# Patient Record
Sex: Female | Born: 1950 | Race: White | Hispanic: No | Smoking: Never smoker
Health system: Southern US, Community
[De-identification: ages and names within clinical notes are randomized; demographics above are authoritative.]

## PROBLEM LIST (undated history)

## (undated) DIAGNOSIS — F32A Depression, unspecified: Secondary | ICD-10-CM

## (undated) DIAGNOSIS — E079 Disorder of thyroid, unspecified: Secondary | ICD-10-CM

## (undated) DIAGNOSIS — I5189 Other ill-defined heart diseases: Secondary | ICD-10-CM

## (undated) DIAGNOSIS — C4490 Unspecified malignant neoplasm of skin, unspecified: Secondary | ICD-10-CM

## (undated) DIAGNOSIS — F329 Major depressive disorder, single episode, unspecified: Secondary | ICD-10-CM

## (undated) DIAGNOSIS — Z9889 Other specified postprocedural states: Secondary | ICD-10-CM

## (undated) DIAGNOSIS — M1990 Unspecified osteoarthritis, unspecified site: Secondary | ICD-10-CM

## (undated) DIAGNOSIS — E785 Hyperlipidemia, unspecified: Secondary | ICD-10-CM

## (undated) DIAGNOSIS — I10 Essential (primary) hypertension: Secondary | ICD-10-CM

## (undated) DIAGNOSIS — M810 Age-related osteoporosis without current pathological fracture: Secondary | ICD-10-CM

## (undated) DIAGNOSIS — R112 Nausea with vomiting, unspecified: Secondary | ICD-10-CM

## (undated) DIAGNOSIS — G709 Myoneural disorder, unspecified: Secondary | ICD-10-CM

## (undated) DIAGNOSIS — Z8673 Personal history of transient ischemic attack (TIA), and cerebral infarction without residual deficits: Secondary | ICD-10-CM

## (undated) DIAGNOSIS — M4800 Spinal stenosis, site unspecified: Secondary | ICD-10-CM

## (undated) DIAGNOSIS — F419 Anxiety disorder, unspecified: Secondary | ICD-10-CM

## (undated) DIAGNOSIS — I4891 Unspecified atrial fibrillation: Secondary | ICD-10-CM

## (undated) HISTORY — DX: Myoneural disorder, unspecified: G70.9

## (undated) HISTORY — DX: Age-related osteoporosis without current pathological fracture: M81.0

## (undated) HISTORY — PX: OTHER SURGICAL HISTORY: SHX169

## (undated) HISTORY — DX: Unspecified osteoarthritis, unspecified site: M19.90

## (undated) HISTORY — DX: Major depressive disorder, single episode, unspecified: F32.9

## (undated) HISTORY — DX: Personal history of transient ischemic attack (TIA), and cerebral infarction without residual deficits: Z86.73

## (undated) HISTORY — PX: TONSILLECTOMY: SUR1361

## (undated) HISTORY — DX: Hyperlipidemia, unspecified: E78.5

## (undated) HISTORY — DX: Depression, unspecified: F32.A

## (undated) HISTORY — PX: CHOLECYSTECTOMY: SHX55

## (undated) HISTORY — DX: Unspecified atrial fibrillation: I48.91

## (undated) HISTORY — PX: LAPAROSCOPIC GASTRIC SLEEVE RESECTION: SHX5895

## (undated) HISTORY — DX: Other ill-defined heart diseases: I51.89

## (undated) HISTORY — PX: CATARACT EXTRACTION: SUR2

## (undated) HISTORY — DX: Anxiety disorder, unspecified: F41.9

## (undated) HISTORY — DX: Unspecified malignant neoplasm of skin, unspecified: C44.90

## (undated) HISTORY — PX: COLON SURGERY: SHX602

---

## 2015-01-12 ENCOUNTER — Other Ambulatory Visit: Payer: Self-pay | Admitting: Orthopedic Surgery

## 2015-01-12 DIAGNOSIS — M545 Low back pain, unspecified: Secondary | ICD-10-CM

## 2015-01-25 ENCOUNTER — Other Ambulatory Visit: Payer: Self-pay

## 2015-01-27 ENCOUNTER — Ambulatory Visit
Admission: RE | Admit: 2015-01-27 | Discharge: 2015-01-27 | Disposition: A | Discharge status: Home / Self Care | Payer: BLUE CROSS/BLUE SHIELD | Source: Ambulatory Visit | Attending: Orthopedic Surgery | Admitting: Orthopedic Surgery

## 2015-01-27 DIAGNOSIS — M545 Low back pain, unspecified: Secondary | ICD-10-CM

## 2016-01-08 ENCOUNTER — Emergency Department (HOSPITAL_COMMUNITY): Payer: BLUE CROSS/BLUE SHIELD

## 2016-01-08 ENCOUNTER — Encounter (HOSPITAL_COMMUNITY): Payer: Self-pay | Admitting: Emergency Medicine

## 2016-01-08 ENCOUNTER — Inpatient Hospital Stay (HOSPITAL_COMMUNITY)
Admission: EM | Admit: 2016-01-08 | Discharge: 2016-01-15 | DRG: 481 | Disposition: A | Discharge status: Skilled Nursing Facility | Payer: BLUE CROSS/BLUE SHIELD | Attending: Orthopedic Surgery | Admitting: Orthopedic Surgery

## 2016-01-08 DIAGNOSIS — S72451A Displaced supracondylar fracture without intracondylar extension of lower end of right femur, initial encounter for closed fracture: Principal | ICD-10-CM | POA: Diagnosis present

## 2016-01-08 DIAGNOSIS — Z79899 Other long term (current) drug therapy: Secondary | ICD-10-CM

## 2016-01-08 DIAGNOSIS — Z881 Allergy status to other antibiotic agents status: Secondary | ICD-10-CM

## 2016-01-08 DIAGNOSIS — I10 Essential (primary) hypertension: Secondary | ICD-10-CM | POA: Diagnosis present

## 2016-01-08 DIAGNOSIS — D62 Acute posthemorrhagic anemia: Secondary | ICD-10-CM

## 2016-01-08 DIAGNOSIS — S72401A Unspecified fracture of lower end of right femur, initial encounter for closed fracture: Secondary | ICD-10-CM | POA: Diagnosis present

## 2016-01-08 DIAGNOSIS — M2341 Loose body in knee, right knee: Secondary | ICD-10-CM | POA: Diagnosis present

## 2016-01-08 DIAGNOSIS — Z6841 Body Mass Index (BMI) 40.0 and over, adult: Secondary | ICD-10-CM | POA: Diagnosis not present

## 2016-01-08 DIAGNOSIS — R001 Bradycardia, unspecified: Secondary | ICD-10-CM | POA: Diagnosis present

## 2016-01-08 DIAGNOSIS — W1809XA Striking against other object with subsequent fall, initial encounter: Secondary | ICD-10-CM | POA: Diagnosis present

## 2016-01-08 DIAGNOSIS — M4800 Spinal stenosis, site unspecified: Secondary | ICD-10-CM | POA: Diagnosis present

## 2016-01-08 DIAGNOSIS — Z880 Allergy status to penicillin: Secondary | ICD-10-CM | POA: Diagnosis not present

## 2016-01-08 DIAGNOSIS — Z419 Encounter for procedure for purposes other than remedying health state, unspecified: Secondary | ICD-10-CM

## 2016-01-08 DIAGNOSIS — Y92009 Unspecified place in unspecified non-institutional (private) residence as the place of occurrence of the external cause: Secondary | ICD-10-CM

## 2016-01-08 HISTORY — DX: Other specified postprocedural states: Z98.890

## 2016-01-08 HISTORY — DX: Spinal stenosis, site unspecified: M48.00

## 2016-01-08 HISTORY — DX: Disorder of thyroid, unspecified: E07.9

## 2016-01-08 HISTORY — DX: Nausea with vomiting, unspecified: R11.2

## 2016-01-08 HISTORY — DX: Essential (primary) hypertension: I10

## 2016-01-08 LAB — CBC WITH DIFFERENTIAL/PLATELET
BASOS ABS: 0 10*3/uL (ref 0.0–0.1)
BASOS PCT: 0 %
EOS ABS: 0.1 10*3/uL (ref 0.0–0.7)
EOS PCT: 1 %
HCT: 38.2 % (ref 36.0–46.0)
Hemoglobin: 12.8 g/dL (ref 12.0–15.0)
Lymphocytes Relative: 11 %
Lymphs Abs: 1.7 10*3/uL (ref 0.7–4.0)
MCH: 30 pg (ref 26.0–34.0)
MCHC: 33.5 g/dL (ref 30.0–36.0)
MCV: 89.7 fL (ref 78.0–100.0)
Monocytes Absolute: 0.7 10*3/uL (ref 0.1–1.0)
Monocytes Relative: 5 %
Neutro Abs: 12.2 10*3/uL — ABNORMAL HIGH (ref 1.7–7.7)
Neutrophils Relative %: 83 %
PLATELETS: 191 10*3/uL (ref 150–400)
RBC: 4.26 MIL/uL (ref 3.87–5.11)
RDW: 13.5 % (ref 11.5–15.5)
WBC: 14.6 10*3/uL — AB (ref 4.0–10.5)

## 2016-01-08 LAB — BASIC METABOLIC PANEL
Anion gap: 8 (ref 5–15)
BUN: 19 mg/dL (ref 6–20)
CO2: 25 mmol/L (ref 22–32)
CREATININE: 0.83 mg/dL (ref 0.44–1.00)
Calcium: 9.1 mg/dL (ref 8.9–10.3)
Chloride: 106 mmol/L (ref 101–111)
GFR calc Af Amer: >60 mL/min (ref >60)
Glucose, Bld: 121 mg/dL — ABNORMAL HIGH (ref 65–99)
POTASSIUM: 3.5 mmol/L (ref 3.5–5.1)
SODIUM: 139 mmol/L (ref 135–145)

## 2016-01-08 LAB — URINALYSIS, ROUTINE W REFLEX MICROSCOPIC
BILIRUBIN URINE: NEGATIVE
Glucose, UA: NEGATIVE mg/dL
HGB URINE DIPSTICK: NEGATIVE
Ketones, ur: NEGATIVE mg/dL
Leukocytes, UA: NEGATIVE
Nitrite: NEGATIVE
PROTEIN: NEGATIVE mg/dL
Specific Gravity, Urine: 1.016 (ref 1.005–1.030)
pH: 5.5 (ref 5.0–8.0)

## 2016-01-08 MED ORDER — PROMETHAZINE HCL 25 MG/ML IJ SOLN
12.5000 mg | INTRAMUSCULAR | Status: AC
  Administered 2016-01-08: 12.5 mg via INTRAVENOUS
  Filled 2016-01-08: qty 1

## 2016-01-08 MED ORDER — HYDROCHLOROTHIAZIDE 25 MG PO TABS
25.0000 mg | ORAL_TABLET | Freq: Every day | ORAL | Status: DC
  Administered 2016-01-08 – 2016-01-14 (×6): 25 mg via ORAL
  Filled 2016-01-08 (×6): qty 1

## 2016-01-08 MED ORDER — GABAPENTIN 600 MG PO TABS
600.0000 mg | ORAL_TABLET | Freq: Three times a day (TID) | ORAL | Status: DC | PRN
  Administered 2016-01-08: 600 mg via ORAL
  Filled 2016-01-08 (×2): qty 1

## 2016-01-08 MED ORDER — ACETAMINOPHEN 500 MG PO TABS
1000.0000 mg | ORAL_TABLET | Freq: Four times a day (QID) | ORAL | Status: DC
  Administered 2016-01-08 – 2016-01-15 (×23): 1000 mg via ORAL
  Filled 2016-01-08 (×24): qty 2

## 2016-01-08 MED ORDER — HYDROMORPHONE HCL 1 MG/ML IJ SOLN
1.0000 mg | Freq: Once | INTRAMUSCULAR | Status: AC
  Administered 2016-01-08: 1 mg via INTRAVENOUS
  Filled 2016-01-08: qty 1

## 2016-01-08 MED ORDER — SERTRALINE HCL 50 MG PO TABS
50.0000 mg | ORAL_TABLET | Freq: Every day | ORAL | Status: DC
  Administered 2016-01-08 – 2016-01-14 (×6): 50 mg via ORAL
  Filled 2016-01-08 (×6): qty 1

## 2016-01-08 MED ORDER — FLEET ENEMA 7-19 GM/118ML RE ENEM: 1.0000 | ENEMA | Freq: Once | RECTAL | Status: DC | PRN

## 2016-01-08 MED ORDER — LEVOTHYROXINE SODIUM 50 MCG PO TABS
50.0000 ug | ORAL_TABLET | Freq: Every day | ORAL | Status: DC
  Administered 2016-01-08 – 2016-01-14 (×6): 50 ug via ORAL
  Filled 2016-01-08 (×6): qty 1

## 2016-01-08 MED ORDER — METOPROLOL SUCCINATE ER 100 MG PO TB24
100.0000 mg | ORAL_TABLET | Freq: Every day | ORAL | Status: DC
  Administered 2016-01-08 – 2016-01-13 (×5): 100 mg via ORAL
  Filled 2016-01-08 (×6): qty 1

## 2016-01-08 MED ORDER — ONDANSETRON HCL 4 MG/2ML IJ SOLN
4.0000 mg | Freq: Once | INTRAMUSCULAR | Status: AC
  Administered 2016-01-08: 4 mg via INTRAVENOUS
  Filled 2016-01-08: qty 2

## 2016-01-08 MED ORDER — BISACODYL 10 MG RE SUPP: 10.0000 mg | Freq: Every day | RECTAL | Status: DC | PRN

## 2016-01-08 MED ORDER — VALSARTAN-HYDROCHLOROTHIAZIDE 160-25 MG PO TABS: 1.0000 | ORAL_TABLET | Freq: Every day | ORAL | Status: DC

## 2016-01-08 MED ORDER — POLYETHYLENE GLYCOL 3350 17 G PO PACK
17.0000 g | PACK | Freq: Every day | ORAL | Status: DC | PRN
  Administered 2016-01-12: 17 g via ORAL
  Filled 2016-01-08: qty 1

## 2016-01-08 MED ORDER — MORPHINE SULFATE (PF) 4 MG/ML IV SOLN
4.0000 mg | Freq: Once | INTRAVENOUS | Status: AC
  Administered 2016-01-08: 4 mg via INTRAVENOUS
  Filled 2016-01-08: qty 1

## 2016-01-08 MED ORDER — ONDANSETRON HCL 4 MG PO TABS: 4.0000 mg | ORAL_TABLET | Freq: Four times a day (QID) | ORAL | Status: DC | PRN

## 2016-01-08 MED ORDER — ONDANSETRON HCL 4 MG/2ML IJ SOLN
4.0000 mg | Freq: Four times a day (QID) | INTRAMUSCULAR | Status: DC | PRN
  Administered 2016-01-09: 4 mg via INTRAVENOUS

## 2016-01-08 MED ORDER — OXYCODONE HCL 5 MG PO TABS
5.0000 mg | ORAL_TABLET | ORAL | Status: DC | PRN
  Administered 2016-01-09: 5 mg via ORAL
  Administered 2016-01-09 – 2016-01-14 (×8): 10 mg via ORAL
  Administered 2016-01-15: 5 mg via ORAL
  Filled 2016-01-08 (×5): qty 2
  Filled 2016-01-08: qty 1
  Filled 2016-01-08 (×2): qty 2
  Filled 2016-01-08: qty 1
  Filled 2016-01-08 (×4): qty 2

## 2016-01-08 MED ORDER — DIPHENHYDRAMINE HCL 12.5 MG/5ML PO ELIX: 12.5000 mg | ORAL_SOLUTION | ORAL | Status: DC | PRN

## 2016-01-08 MED ORDER — DOCUSATE SODIUM 100 MG PO CAPS
100.0000 mg | ORAL_CAPSULE | Freq: Two times a day (BID) | ORAL | Status: DC
  Administered 2016-01-08 – 2016-01-15 (×11): 100 mg via ORAL
  Filled 2016-01-08 (×13): qty 1

## 2016-01-08 MED ORDER — IRBESARTAN 150 MG PO TABS
150.0000 mg | ORAL_TABLET | Freq: Every day | ORAL | Status: DC
  Administered 2016-01-08 – 2016-01-14 (×6): 150 mg via ORAL
  Filled 2016-01-08 (×6): qty 1

## 2016-01-08 MED ORDER — BUPROPION HCL ER (XL) 150 MG PO TB24
300.0000 mg | ORAL_TABLET | Freq: Every day | ORAL | Status: DC
  Administered 2016-01-08 – 2016-01-14 (×6): 300 mg via ORAL
  Filled 2016-01-08 (×6): qty 2

## 2016-01-08 MED ORDER — MORPHINE SULFATE (PF) 2 MG/ML IV SOLN
1.0000 mg | INTRAVENOUS | Status: DC | PRN
  Administered 2016-01-08: 2 mg via INTRAVENOUS
  Filled 2016-01-08: qty 1

## 2016-01-08 MED ORDER — POTASSIUM CHLORIDE IN NACL 20-0.9 MEQ/L-% IV SOLN
INTRAVENOUS | Status: DC
  Administered 2016-01-08 – 2016-01-10 (×4): via INTRAVENOUS
  Filled 2016-01-08 (×4): qty 1000

## 2016-01-08 MED ORDER — ATORVASTATIN CALCIUM 10 MG PO TABS
10.0000 mg | ORAL_TABLET | Freq: Every day | ORAL | Status: DC
  Administered 2016-01-08 – 2016-01-14 (×6): 10 mg via ORAL
  Filled 2016-01-08 (×6): qty 1

## 2016-01-08 MED ORDER — ALPRAZOLAM 0.5 MG PO TABS
0.5000 mg | ORAL_TABLET | Freq: Every evening | ORAL | Status: DC | PRN
  Administered 2016-01-08: 0.5 mg via ORAL
  Filled 2016-01-08: qty 1

## 2016-01-08 NOTE — Progress Notes (Addendum)
Pt states her pcp is at [facility] and her orthopedic provider is Dorna [facility] updated  Base line pt is using walker at home

## 2016-01-08 NOTE — H&P (Signed)
Christina Rubio is an 65 y.o. female.   Chief Complaint: right knee pain HPI: 65 year old with a hx spinal stenosis tx at [place] feels earlier today at home, tripping on a rug. Severe pain in right knee and unable to bear weight. Taken to ED via EMS. Denies LOC. Denies other musculoskeletal pain. Pain worse with any mvmt, better w/ rest. Denies hx knee pain. Ambulates with a walker as needed.   Past Medical History:  Diagnosis Date  . Hypertension   . PONV (postoperative nausea and vomiting)   . Spinal stenosis   . Thyroid disease     Past Surgical History:  Procedure Laterality Date  . CESAREAN SECTION    . CHOLECYSTECTOMY    . COLON SURGERY    . LAPAROSCOPIC GASTRIC SLEEVE RESECTION    . Right thyroidectomy    . TONSILLECTOMY      History reviewed. No pertinent family history. Social History:  reports that she has never smoked. She has never used smokeless tobacco. She reports that she does not drink alcohol or use drugs.  Allergies:  Allergies  Allergen Reactions  . Epinephrine Palpitations    faint  . Penicillins Rash    Has patient had a PCN reaction causing immediate rash, facial/tongue/throat swelling, SOB or lightheadedness with hypotension:  Yes Has patient had a PCN reaction causing severe rash involving mucus membranes or skin necrosis: No Has patient had a PCN reaction that required hospitalization: No Has patient had a PCN reaction occurring within the last 10 years: No If all of the above answers are "NO", then may proceed with Cephalosporin use.;     (Not in a hospital admission)  Results for orders placed or performed during the hospital encounter of 01/08/16 (from the past 48 hour(s))  Urinalysis, Routine w reflex microscopic (not at [facility])     Status: None   Collection Time: 01/08/16  2:56 PM  Result Value Ref Range   Color, Urine YELLOW YELLOW   APPearance CLEAR CLEAR   Specific Gravity, Urine 1.016 1.005 - 1.030   pH 5.5 5.0 - 8.0   Glucose,  UA NEGATIVE NEGATIVE mg/dL   Hgb urine dipstick NEGATIVE NEGATIVE   Bilirubin Urine NEGATIVE NEGATIVE   Ketones, ur NEGATIVE NEGATIVE mg/dL   Protein, ur NEGATIVE NEGATIVE mg/dL   Nitrite NEGATIVE NEGATIVE   Leukocytes, UA NEGATIVE NEGATIVE    Comment: MICROSCOPIC NOT DONE ON URINES WITH NEGATIVE PROTEIN, BLOOD, LEUKOCYTES, NITRITE, OR GLUCOSE <1000 mg/dL.  Basic metabolic panel     Status: Abnormal   Collection Time: 01/08/16  4:08 PM  Result Value Ref Range   Sodium 139 135 - 145 mmol/L   Potassium 3.5 3.5 - 5.1 mmol/L   Chloride 106 101 - 111 mmol/L   CO2 25 22 - 32 mmol/L   Glucose, Bld 121 (H) 65 - 99 mg/dL   BUN 19 6 - 20 mg/dL   Creatinine, Ser 0.83 0.44 - 1.00 mg/dL   Calcium 9.1 8.9 - 10.3 mg/dL   GFR calc non Af Amer >60 >60 mL/min   GFR calc Af Amer >60 >60 mL/min    Comment: (NOTE) The eGFR has been calculated using the CKD EPI equation. This calculation has not been validated in all clinical situations. eGFR's persistently <60 mL/min signify possible Chronic Kidney Disease.    Anion gap 8 5 - 15  CBC with Differential     Status: Abnormal   Collection Time: 01/08/16  4:08 PM  Result Value Ref  Range   WBC 14.6 (H) 4.0 - 10.5 K/uL   RBC 4.26 3.87 - 5.11 MIL/uL   Hemoglobin 12.8 12.0 - 15.0 g/dL   HCT 38.2 36.0 - 46.0 %   MCV 89.7 78.0 - 100.0 fL   MCH 30.0 26.0 - 34.0 pg   MCHC 33.5 30.0 - 36.0 g/dL   RDW 13.5 11.5 - 15.5 %   Platelets 191 150 - 400 K/uL   Neutrophils Relative % 83 %   Neutro Abs 12.2 (H) 1.7 - 7.7 K/uL   Lymphocytes Relative 11 %   Lymphs Abs 1.7 0.7 - 4.0 K/uL   Monocytes Relative 5 %   Monocytes Absolute 0.7 0.1 - 1.0 K/uL   Eosinophils Relative 1 %   Eosinophils Absolute 0.1 0.0 - 0.7 K/uL   Basophils Relative 0 %   Basophils Absolute 0.0 0.0 - 0.1 K/uL   Dg Chest Portable 1 View  Result Date: 01/08/2016 CLINICAL DATA:  65 year old female right femur fracture. Preoperative study. Initial encounter. EXAM: PORTABLE CHEST 1 VIEW  COMPARISON:  None. FINDINGS: Portable AP semi upright view at 1537 hours. Mild to moderate cardiomegaly. Calcified aortic atherosclerosis. Other mediastinal contours are within normal limits. Visualized tracheal air column is within normal limits. Allowing for portable technique the lungs are clear. IMPRESSION: No acute cardiopulmonary abnormality. Cardiomegaly and calcified aortic atherosclerosis. Electronically Signed   By: Genevie Ann M.D.   On: 01/08/2016 15:58   Dg Knee Complete 4 Views Right  Result Date: 01/08/2016 CLINICAL DATA:  Right knee pain after fall today. EXAM: RIGHT KNEE - COMPLETE 4+ VIEW COMPARISON:  None. FINDINGS: Severely displaced fracture is seen involving the distal right femur. Severe narrowing of the medial joint space is noted. Mild narrowing of lateral joint space is noted. No soft tissue abnormality is noted. IMPRESSION: Severely displaced distal right femoral fracture. Electronically Signed   By: Marijo Conception, M.D.   On: 01/08/2016 15:15   Dg Hip Unilat W Or Wo Pelvis 2-3 Views Right  Result Date: 01/08/2016 CLINICAL DATA:  Right knee pain after fall today. EXAM: DG HIP (WITH OR WITHOUT PELVIS) 2-3V RIGHT COMPARISON:  None. FINDINGS: There is no evidence of hip fracture or dislocation. There is no evidence of arthropathy or other focal bone abnormality. IMPRESSION: Normal right hip. Electronically Signed   By: Marijo Conception, M.D.   On: 01/08/2016 15:17    Review of Systems  All other systems reviewed and are negative.   Blood pressure 137/61, pulse (!) 51, temperature 97.6 F (36.4 C), resp. rate 11, height '5\' 5"'$  (1.651 m), weight (!) 154.2 kg (340 lb), SpO2 96 %. Physical Exam  Constitutional: She is oriented to person, place, and time. She appears well-developed.  obese  HENT:  Head: Normocephalic and atraumatic.  Eyes: EOM are normal.  Neck: Normal range of motion.  Cardiovascular: Normal rate.   Respiratory: Effort normal.  GI: Soft.  Musculoskeletal:   R LE shortened  bilateral LE chronic edema R knee pain w/ ROM, mod swelling Skin intact Distally NVI   Neurological: She is alert and oriented to person, place, and time.  Skin: Skin is warm and dry.  Psychiatric: She has a normal mood and affect. Her behavior is normal. Judgment and thought content normal.     Assessment/Plan Displaced distal femoral fracture Plan for right IM retrograde femoral nail to promote early weightbearing and ambulation Dr. Tamera Punt to admit tonight, Dr. Berenice Primas to assume care tomorrow and perform surgery tomorrow evening  Order in to 5lb bucks traction R leg Plan to transfer to [facility] tonight NPO at midnight Bed rest/NWB   Grier Mitts, PA-C 01/08/2016, 5:26 PM

## 2016-01-08 NOTE — ED Provider Notes (Signed)
[facility] DEPT Provider Note   CSN: [id number] Arrival date & time: 01/08/16  1232     History   Chief Complaint Chief Complaint  Patient presents with  . Fall  . Knee Pain    right    HPI Christina Rubio is a 65 y.o. female.  HPI  65 year old female with history of spinal stenosis, using a walker to walk, presenting for a recent fall. Patient was brought here via EMS from home. Patient report about 4 hours ago she was using a walker, approaching the door to let her dog out when she may have slipped on a rug, fell forward striking her right knee against the ground and felt on the right side. She did hit her head but denies any loss of consciousness. She report acute onset of sharp severe pain to her right knee unable to bear any weight. Family member at home did contact EMS and patient was brought here. She will receive 81mcg Fentanyl on Route with minimal improvement. The pain is currently rated as 10 out of 10, radiates towards her hip and down her foot. Report mild tingling sensation to the toes but this is chronic. She denies any precipitating symptoms prior to the fall. No symptoms of headache, neck pain, chest pain, shortness of breath, abdominal pain, back pain, bowel bladder incontinence, saddle anesthesia, focal numbness or weakness. She is not on any anticoagulant.    Past Medical History:  Diagnosis Date  . Hypertension   . Spinal stenosis   . Thyroid disease     There are no active problems to display for this patient.   Past Surgical History:  Procedure Laterality Date  . CHOLECYSTECTOMY    . LAPAROSCOPIC GASTRIC SLEEVE RESECTION      OB History    No data available       Home Medications    Prior to Admission medications   Medication Sig Start Date End Date Taking? Authorizing Provider  ALPRAZolam Duanne Moron) 0.5 MG tablet Take 0.5 mg by mouth at bedtime as needed for anxiety or sleep.  12/12/15  Yes Historical Provider, MD  atorvastatin (LIPITOR) 10  MG tablet Take 10 mg by mouth at bedtime.  10/02/15  Yes Historical Provider, MD  buPROPion (WELLBUTRIN XL) 300 MG 24 hr tablet Take 300 mg by mouth at bedtime.  12/26/15  Yes Historical Provider, MD  gabapentin (NEURONTIN) 600 MG tablet Take 600 mg by mouth 3 (three) times daily as needed (nerve pain).  12/08/15  Yes Historical Provider, MD  levothyroxine (SYNTHROID, LEVOTHROID) 50 MCG tablet Take 50 mcg by mouth at bedtime.  12/08/15  Yes Historical Provider, MD  metoprolol succinate (TOPROL-XL) 100 MG 24 hr tablet Take 100 mg by mouth at bedtime.  12/10/15  Yes Historical Provider, MD  Multiple Vitamins-Minerals (PRESERVISION AREDS 2) CAPS Take 1 tablet by mouth 2 (two) times daily.   Yes Historical Provider, MD  naproxen sodium (ANAPROX) 220 MG tablet Take 440 mg by mouth every 12 (twelve) hours as needed (pain).   Yes Historical Provider, MD  sertraline (ZOLOFT) 50 MG tablet Take 50 mg by mouth at bedtime.  10/02/15  Yes Historical Provider, MD  valsartan-hydrochlorothiazide (DIOVAN-HCT) 160-25 MG tablet Take 1 tablet by mouth at bedtime.  12/10/15  Yes Historical Provider, MD    Family History No family history on file.  Social History Social History  Substance Use Topics  . Smoking status: Never Smoker  . Smokeless tobacco: Never Used  . Alcohol use No  Allergies   Epinephrine and Penicillins   Review of Systems Review of Systems  All other systems reviewed and are negative.    Physical Exam Updated Vital Signs BP 163/88   Pulse (!) 50   Temp 97.6 F (36.4 C)   Resp 20   Ht 5\' 5"  (1.651 m)   Wt (!) 154.2 kg   SpO2 98%   BMI 56.58 kg/m   Physical Exam  Constitutional: She appears well-developed and well-nourished. No distress.  Moderately obese elderly female laying in bed in mild discomfort.  HENT:  Head: Normocephalic.  Faint abrasion noted to right temporal region with mild tenderness but no crepitus  Eyes: Conjunctivae are normal.  Neck: Normal range of  motion. Neck supple.  No cervical midline spine tenderness crepitus or step-off  Cardiovascular: Intact distal pulses.   Bradycardia without murmurs rubs or gallops  Pulmonary/Chest: Effort normal and breath sounds normal.  Abdominal: Soft. There is no tenderness.  Musculoskeletal: She exhibits tenderness (Right low sure you: Significant tenderness noted throughout lateral hip, along femur, as well as anterior knee with decreased range of motion secondary to pain. No significant tenderness to right ankle or right foot. Besides pedis pulse palpable. ).  Examination is limited due to large body habitus. Right leg is externally rotated, shortened that this could be due to body positioning.  Neurological: She is alert.  Skin: No rash noted.  Small abrasion noted to right posterior elbow with normal elbow flexion and extension and no gross deformity.  Psychiatric: She has a normal mood and affect.  Nursing note and vitals reviewed.    ED Treatments / Results  Labs (all labs ordered are listed, but only abnormal results are displayed) Labs Reviewed  BASIC METABOLIC PANEL - Abnormal; Notable for the following:       Result Value   Glucose, Bld 121 (*)    All other components within normal limits  CBC WITH DIFFERENTIAL/PLATELET - Abnormal; Notable for the following:    WBC 14.6 (*)    Neutro Abs 12.2 (*)    All other components within normal limits  URINALYSIS, ROUTINE W REFLEX MICROSCOPIC (NOT AT [facility])    EKG  EKG Interpretation None       Radiology Dg Chest Portable 1 View  Result Date: 01/08/2016 CLINICAL DATA:  65 year old female right femur fracture. Preoperative study. Initial encounter. EXAM: PORTABLE CHEST 1 VIEW COMPARISON:  None. FINDINGS: Portable AP semi upright view at 1537 hours. Mild to moderate cardiomegaly. Calcified aortic atherosclerosis. Other mediastinal contours are within normal limits. Visualized tracheal air column is within normal limits. Allowing for  portable technique the lungs are clear. IMPRESSION: No acute cardiopulmonary abnormality. Cardiomegaly and calcified aortic atherosclerosis. Electronically Signed   By: Genevie Ann M.D.   On: 01/08/2016 15:58   Dg Knee Complete 4 Views Right  Result Date: 01/08/2016 CLINICAL DATA:  Right knee pain after fall today. EXAM: RIGHT KNEE - COMPLETE 4+ VIEW COMPARISON:  None. FINDINGS: Severely displaced fracture is seen involving the distal right femur. Severe narrowing of the medial joint space is noted. Mild narrowing of lateral joint space is noted. No soft tissue abnormality is noted. IMPRESSION: Severely displaced distal right femoral fracture. Electronically Signed   By: Marijo Conception, M.D.   On: 01/08/2016 15:15   Dg Hip Unilat W Or Wo Pelvis 2-3 Views Right  Result Date: 01/08/2016 CLINICAL DATA:  Right knee pain after fall today. EXAM: DG HIP (WITH OR WITHOUT PELVIS) 2-3V RIGHT COMPARISON:  None. FINDINGS: There is no evidence of hip fracture or dislocation. There is no evidence of arthropathy or other focal bone abnormality. IMPRESSION: Normal right hip. Electronically Signed   By: Marijo Conception, M.D.   On: 01/08/2016 15:17    Procedures Procedures (including critical care time)  Medications Ordered in ED Medications  morphine 4 MG/ML injection 4 mg (4 mg Intravenous Given 01/08/16 1450)  ondansetron (ZOFRAN) injection 4 mg (4 mg Intravenous Given 01/08/16 1450)  HYDROmorphone (DILAUDID) injection 1 mg (1 mg Intravenous Given 01/08/16 1530)  ondansetron (ZOFRAN) injection 4 mg (4 mg Intravenous Given 01/08/16 1619)     Initial Impression / Assessment and Plan / ED Course  I have reviewed the triage vital signs and the nursing notes.  Pertinent labs & imaging results that were available during my care of the patient were reviewed by me and considered in my medical decision making (see chart for details).  Clinical Course    BP 137/61   Pulse (!) 51   Temp 97.6 F (36.4 C)    Resp 11   Ht 5\' 5"  (1.651 m)   Wt (!) 154.2 kg   SpO2 96%   BMI 56.58 kg/m    Final Clinical Impressions(s) / ED Diagnoses   Final diagnoses:  Closed fracture of distal end of right femur, unspecified fracture morphology, initial encounter [facility])    New Prescriptions New Prescriptions   No medications on file   3:59 PM Patient presents for evaluation of a recent fall. Likely a mechanical fall. She has significant tenderness noted to the anterior knee and distal femoral region. X-ray of right hip shows no acute fracture or dislocation. X-ray of right knee show a moderately displaced distal femoral fracture. This is a closed injury. Sensation is intact distally. Patient follow-up with [facility] orthopedist. Will consult for further management. Patient made nothing by mouth, labs ordered.  Patient is bradycardic, she is currently on a calcium channel blocker and beta blocker which I suspect attributed to her bradycardia. She denies any lightheadedness of dizziness therefore I do not think this is medication induced.  4:56 PM Appreciate consultation from [place], Dr. Bettina Gavia PA.  She will see pt in the ER and will admit for further management.  Anticipate surgical management with intermedullary rod to R knee tomorrow.  Will continue to monitor pt.     Domenic Moras, PA-C 01/08/16 2016    Fatima Blank, MD 01/11/16 [phone]

## 2016-01-08 NOTE — ED Notes (Signed)
PA at bedside.

## 2016-01-08 NOTE — ED Notes (Signed)
Bed: WA25 Expected date:  Expected time:  Means of arrival:  Comments: EMS fall 

## 2016-01-08 NOTE — ED Notes (Signed)
Gave patient crackers and gingerale 

## 2016-01-08 NOTE — ED Notes (Signed)
Patient transported to X-ray 

## 2016-01-08 NOTE — ED Triage Notes (Signed)
Patient comes from home for fall per EMS. patient c/o pain in right knee. Patient was walking to back door with walker when she went down on right knee.  Patient has little abrasion to right hip per EMS.  Patient given Fentanyl 25mcg in route via IV.

## 2016-01-08 NOTE — ED Notes (Signed)
PA student at bedside.

## 2016-01-09 ENCOUNTER — Encounter (HOSPITAL_COMMUNITY)
Admission: EM | Disposition: A | Discharge status: Skilled Nursing Facility | Payer: Self-pay | Source: Home / Self Care | Attending: Orthopedic Surgery

## 2016-01-09 ENCOUNTER — Inpatient Hospital Stay (HOSPITAL_COMMUNITY): Payer: BLUE CROSS/BLUE SHIELD | Admitting: Certified Registered"

## 2016-01-09 ENCOUNTER — Inpatient Hospital Stay (HOSPITAL_COMMUNITY): Payer: BLUE CROSS/BLUE SHIELD

## 2016-01-09 HISTORY — PX: FEMUR IM NAIL: SHX1597

## 2016-01-09 LAB — GLUCOSE, CAPILLARY: Glucose-Capillary: 113 mg/dL — ABNORMAL HIGH (ref 65–99)

## 2016-01-09 LAB — CREATININE, SERUM
CREATININE: 1.25 mg/dL — AB (ref 0.44–1.00)
GFR calc Af Amer: 52 mL/min — ABNORMAL LOW (ref >60)
GFR calc non Af Amer: 44 mL/min — ABNORMAL LOW (ref >60)

## 2016-01-09 LAB — SURGICAL PCR SCREEN
MRSA, PCR: NEGATIVE
STAPHYLOCOCCUS AUREUS: POSITIVE — AB

## 2016-01-09 LAB — CBC
HCT: 31.2 % — ABNORMAL LOW (ref 36.0–46.0)
Hemoglobin: 10.2 g/dL — ABNORMAL LOW (ref 12.0–15.0)
MCH: 29.2 pg (ref 26.0–34.0)
MCHC: 32.7 g/dL (ref 30.0–36.0)
MCV: 89.4 fL (ref 78.0–100.0)
PLATELETS: 174 10*3/uL (ref 150–400)
RBC: 3.49 MIL/uL — ABNORMAL LOW (ref 3.87–5.11)
RDW: 13.7 % (ref 11.5–15.5)
WBC: 15.8 10*3/uL — ABNORMAL HIGH (ref 4.0–10.5)

## 2016-01-09 SURGERY — INSERTION, INTRAMEDULLARY ROD, FEMUR, RETROGRADE
Anesthesia: General | Laterality: Right

## 2016-01-09 MED ORDER — LACTATED RINGERS IV SOLN
INTRAVENOUS | Status: DC
  Administered 2016-01-09: 13:00:00 via INTRAVENOUS

## 2016-01-09 MED ORDER — ONDANSETRON HCL 4 MG/2ML IJ SOLN
INTRAMUSCULAR | Status: AC
  Filled 2016-01-09: qty 2

## 2016-01-09 MED ORDER — CLINDAMYCIN PHOSPHATE 900 MG/50ML IV SOLN
INTRAVENOUS | Status: AC
  Filled 2016-01-09: qty 50

## 2016-01-09 MED ORDER — MUPIROCIN 2 % EX OINT
1.0000 "application " | TOPICAL_OINTMENT | Freq: Two times a day (BID) | CUTANEOUS | Status: AC
  Administered 2016-01-09 – 2016-01-14 (×10): 1 via NASAL
  Filled 2016-01-09 (×2): qty 22

## 2016-01-09 MED ORDER — SCOPOLAMINE 1 MG/3DAYS TD PT72
MEDICATED_PATCH | TRANSDERMAL | Status: AC
  Administered 2016-01-09: 14:00:00
  Filled 2016-01-09: qty 1

## 2016-01-09 MED ORDER — DEXTROSE 5 % IV SOLN: 3.0000 g | INTRAVENOUS | Status: DC

## 2016-01-09 MED ORDER — EPHEDRINE 5 MG/ML INJ
INTRAVENOUS | Status: AC
  Filled 2016-01-09: qty 10

## 2016-01-09 MED ORDER — POVIDONE-IODINE 10 % EX SWAB: 2.0000 "application " | Freq: Once | CUTANEOUS | Status: DC

## 2016-01-09 MED ORDER — CHLORHEXIDINE GLUCONATE CLOTH 2 % EX PADS
6.0000 | MEDICATED_PAD | Freq: Every day | CUTANEOUS | Status: AC
  Administered 2016-01-09 – 2016-01-13 (×5): 6 via TOPICAL

## 2016-01-09 MED ORDER — ONDANSETRON HCL 4 MG PO TABS
4.0000 mg | ORAL_TABLET | Freq: Four times a day (QID) | ORAL | Status: DC | PRN
  Administered 2016-01-14 (×2): 4 mg via ORAL
  Filled 2016-01-09 (×3): qty 1

## 2016-01-09 MED ORDER — WHITE PETROLATUM GEL
Status: AC
  Administered 2016-01-09: 19:00:00
  Filled 2016-01-09: qty 1

## 2016-01-09 MED ORDER — CLINDAMYCIN PHOSPHATE 900 MG/50ML IV SOLN
INTRAVENOUS | Status: DC | PRN
  Administered 2016-01-09: 900 mg via INTRAVENOUS

## 2016-01-09 MED ORDER — ENOXAPARIN SODIUM 40 MG/0.4ML ~~LOC~~ SOLN: 40.0000 mg | SUBCUTANEOUS | 0 refills | Status: DC

## 2016-01-09 MED ORDER — FENTANYL CITRATE (PF) 100 MCG/2ML IJ SOLN
INTRAMUSCULAR | Status: DC | PRN
  Administered 2016-01-09: 50 ug via INTRAVENOUS

## 2016-01-09 MED ORDER — OXYCODONE HCL 5 MG PO TABS: 5.0000 mg | ORAL_TABLET | ORAL | 0 refills | Status: DC | PRN

## 2016-01-09 MED ORDER — PROPOFOL 10 MG/ML IV BOLUS
INTRAVENOUS | Status: DC | PRN
  Administered 2016-01-09: 200 mg via INTRAVENOUS

## 2016-01-09 MED ORDER — ONDANSETRON HCL 4 MG/2ML IJ SOLN: 4.0000 mg | Freq: Once | INTRAMUSCULAR | Status: DC | PRN

## 2016-01-09 MED ORDER — HYDROMORPHONE HCL 2 MG/ML IJ SOLN
INTRAMUSCULAR | Status: AC
  Administered 2016-01-09: 0.5 mg via INTRAVENOUS
  Filled 2016-01-09: qty 1

## 2016-01-09 MED ORDER — LIDOCAINE 2% (20 MG/ML) 5 ML SYRINGE
INTRAMUSCULAR | Status: AC
  Filled 2016-01-09: qty 5

## 2016-01-09 MED ORDER — MIDAZOLAM HCL 5 MG/5ML IJ SOLN
INTRAMUSCULAR | Status: DC | PRN
  Administered 2016-01-09: 2 mg via INTRAVENOUS

## 2016-01-09 MED ORDER — CEFAZOLIN SODIUM-DEXTROSE 2-4 GM/100ML-% IV SOLN
2.0000 g | Freq: Three times a day (TID) | INTRAVENOUS | Status: AC
  Administered 2016-01-09 – 2016-01-11 (×5): 2 g via INTRAVENOUS
  Filled 2016-01-09 (×5): qty 100

## 2016-01-09 MED ORDER — ONDANSETRON HCL 4 MG/2ML IJ SOLN
4.0000 mg | Freq: Four times a day (QID) | INTRAMUSCULAR | Status: DC | PRN
Start: 2016-01-09 — End: 2016-01-15
  Administered 2016-01-13 – 2016-01-15 (×4): 4 mg via INTRAVENOUS
  Filled 2016-01-09 (×4): qty 2

## 2016-01-09 MED ORDER — SUCCINYLCHOLINE CHLORIDE 200 MG/10ML IV SOSY
PREFILLED_SYRINGE | INTRAVENOUS | Status: AC
  Filled 2016-01-09: qty 10

## 2016-01-09 MED ORDER — SUCCINYLCHOLINE CHLORIDE 20 MG/ML IJ SOLN
INTRAMUSCULAR | Status: DC | PRN
  Administered 2016-01-09: 120 mg via INTRAVENOUS

## 2016-01-09 MED ORDER — 0.9 % SODIUM CHLORIDE (POUR BTL) OPTIME
TOPICAL | Status: DC | PRN
  Administered 2016-01-09: 1000 mL

## 2016-01-09 MED ORDER — HYDROMORPHONE HCL 2 MG/ML IJ SOLN
0.2500 mg | INTRAMUSCULAR | Status: DC | PRN
  Administered 2016-01-09 (×4): 0.5 mg via INTRAVENOUS

## 2016-01-09 MED ORDER — MIDAZOLAM HCL 2 MG/2ML IJ SOLN
INTRAMUSCULAR | Status: AC
  Filled 2016-01-09: qty 2

## 2016-01-09 MED ORDER — LACTATED RINGERS IV SOLN
INTRAVENOUS | Status: DC
  Administered 2016-01-09 (×2): via INTRAVENOUS

## 2016-01-09 MED ORDER — MEPERIDINE HCL 25 MG/ML IJ SOLN: 6.2500 mg | INTRAMUSCULAR | Status: DC | PRN

## 2016-01-09 MED ORDER — SUGAMMADEX SODIUM 500 MG/5ML IV SOLN
INTRAVENOUS | Status: DC | PRN
  Administered 2016-01-09: 310 mg via INTRAVENOUS

## 2016-01-09 MED ORDER — SUGAMMADEX SODIUM 500 MG/5ML IV SOLN
INTRAVENOUS | Status: AC
  Filled 2016-01-09: qty 5

## 2016-01-09 MED ORDER — LIDOCAINE HCL (CARDIAC) 20 MG/ML IV SOLN
INTRAVENOUS | Status: DC | PRN
  Administered 2016-01-09: 100 mg via INTRAVENOUS

## 2016-01-09 MED ORDER — METHOCARBAMOL 500 MG PO TABS
500.0000 mg | ORAL_TABLET | Freq: Four times a day (QID) | ORAL | Status: DC | PRN
  Administered 2016-01-10 – 2016-01-15 (×4): 500 mg via ORAL
  Filled 2016-01-09 (×7): qty 1

## 2016-01-09 MED ORDER — BUPIVACAINE-EPINEPHRINE (PF) 0.5% -1:200000 IJ SOLN
INTRAMUSCULAR | Status: AC
  Filled 2016-01-09: qty 30

## 2016-01-09 MED ORDER — ALBUMIN HUMAN 5 % IV SOLN
INTRAVENOUS | Status: DC | PRN
  Administered 2016-01-09: 16:00:00 via INTRAVENOUS

## 2016-01-09 MED ORDER — PROPOFOL 10 MG/ML IV BOLUS
INTRAVENOUS | Status: AC
  Filled 2016-01-09: qty 20

## 2016-01-09 MED ORDER — ALUM & MAG HYDROXIDE-SIMETH 200-200-20 MG/5ML PO SUSP
30.0000 mL | ORAL | Status: DC | PRN
  Filled 2016-01-09: qty 30

## 2016-01-09 MED ORDER — ENOXAPARIN SODIUM 40 MG/0.4ML ~~LOC~~ SOLN
40.0000 mg | SUBCUTANEOUS | Status: DC
  Administered 2016-01-10: 40 mg via SUBCUTANEOUS
  Filled 2016-01-09: qty 0.4

## 2016-01-09 MED ORDER — METHOCARBAMOL 750 MG PO TABS: 750.0000 mg | ORAL_TABLET | Freq: Three times a day (TID) | ORAL | 0 refills | Status: DC | PRN

## 2016-01-09 MED ORDER — ROCURONIUM BROMIDE 100 MG/10ML IV SOLN
INTRAVENOUS | Status: DC | PRN
  Administered 2016-01-09: 50 mg via INTRAVENOUS

## 2016-01-09 MED ORDER — SCOPOLAMINE HBR 0.4 MG/ML IJ SOLN: 0.4000 mg | Freq: Once | INTRAMUSCULAR | Status: DC

## 2016-01-09 MED ORDER — ROCURONIUM BROMIDE 10 MG/ML (PF) SYRINGE
PREFILLED_SYRINGE | INTRAVENOUS | Status: AC
  Filled 2016-01-09: qty 10

## 2016-01-09 MED ORDER — METHOCARBAMOL 1000 MG/10ML IJ SOLN
500.0000 mg | Freq: Four times a day (QID) | INTRAVENOUS | Status: DC | PRN
  Filled 2016-01-09: qty 5

## 2016-01-09 MED ORDER — ZOLPIDEM TARTRATE 5 MG PO TABS: 5.0000 mg | ORAL_TABLET | Freq: Every evening | ORAL | Status: DC | PRN

## 2016-01-09 MED ORDER — EPHEDRINE SULFATE 50 MG/ML IJ SOLN
INTRAMUSCULAR | Status: DC | PRN
  Administered 2016-01-09 (×4): 10 mg via INTRAVENOUS

## 2016-01-09 MED ORDER — CHLORHEXIDINE GLUCONATE 4 % EX LIQD: 60.0000 mL | Freq: Once | CUTANEOUS | Status: DC

## 2016-01-09 MED ORDER — FENTANYL CITRATE (PF) 100 MCG/2ML IJ SOLN
INTRAMUSCULAR | Status: AC
  Filled 2016-01-09: qty 4

## 2016-01-09 MED ORDER — FERROUS SULFATE 325 (65 FE) MG PO TABS
325.0000 mg | ORAL_TABLET | Freq: Two times a day (BID) | ORAL | Status: DC
Start: 2016-01-09 — End: 2016-01-15
  Administered 2016-01-10 – 2016-01-15 (×11): 325 mg via ORAL
  Filled 2016-01-09 (×11): qty 1

## 2016-01-09 SURGICAL SUPPLY — 65 items
BANDAGE ACE 4X5 VEL STRL LF (GAUZE/BANDAGES/DRESSINGS) IMPLANT
BANDAGE ACE 6X5 VEL STRL LF (GAUZE/BANDAGES/DRESSINGS) ×2 IMPLANT
BANDAGE ESMARK 6X9 LF (GAUZE/BANDAGES/DRESSINGS) ×1 IMPLANT
BIT DRILL CALIBRATED 4.3MMX365 (DRILL) ×1 IMPLANT
BIT DRILL CROWE PNT TWST 4.5MM (DRILL) ×1 IMPLANT
BLADE SURG 15 STRL LF DISP TIS (BLADE) IMPLANT
BLADE SURG 15 STRL SS (BLADE)
BLADE SURG ROTATE 9660 (MISCELLANEOUS) IMPLANT
BNDG COHESIVE 6X5 TAN STRL LF (GAUZE/BANDAGES/DRESSINGS) IMPLANT
BNDG ESMARK 6X9 LF (GAUZE/BANDAGES/DRESSINGS) ×2
BNDG GAUZE ELAST 4 BULKY (GAUZE/BANDAGES/DRESSINGS) IMPLANT
CUFF TOURNIQUET SINGLE 34IN LL (TOURNIQUET CUFF) IMPLANT
CUFF TOURNIQUET SINGLE 44IN (TOURNIQUET CUFF) IMPLANT
DRAPE C-ARM 42X72 X-RAY (DRAPES) ×2 IMPLANT
DRAPE IMP U-DRAPE 54X76 (DRAPES) ×2 IMPLANT
DRAPE ORTHO SPLIT 77X108 STRL (DRAPES) ×3
DRAPE PROXIMA HALF (DRAPES) ×4 IMPLANT
DRAPE SURG ORHT 6 SPLT 77X108 (DRAPES) ×3 IMPLANT
DRAPE U-SHAPE 47X51 STRL (DRAPES) ×2 IMPLANT
DRILL CALIBRATED 4.3MMX365 (DRILL) ×2
DRILL CROWE POINT TWIST 4.5MM (DRILL) ×2
DRSG MEPILEX BORDER 4X4 (GAUZE/BANDAGES/DRESSINGS) ×2 IMPLANT
DRSG PAD ABDOMINAL 8X10 ST (GAUZE/BANDAGES/DRESSINGS) ×2 IMPLANT
DURAPREP 26ML APPLICATOR (WOUND CARE) ×4 IMPLANT
ELECT REM PT RETURN 9FT ADLT (ELECTROSURGICAL) ×2
ELECTRODE REM PT RTRN 9FT ADLT (ELECTROSURGICAL) ×1 IMPLANT
GAUZE SPONGE 4X4 12PLY STRL (GAUZE/BANDAGES/DRESSINGS) ×2 IMPLANT
GAUZE XEROFORM 1X8 LF (GAUZE/BANDAGES/DRESSINGS) IMPLANT
GAUZE XEROFORM 5X9 LF (GAUZE/BANDAGES/DRESSINGS) ×2 IMPLANT
GLOVE BIOGEL PI IND STRL 8 (GLOVE) ×2 IMPLANT
GLOVE BIOGEL PI INDICATOR 8 (GLOVE) ×2
GLOVE ECLIPSE 7.5 STRL STRAW (GLOVE) ×4 IMPLANT
GOWN STRL REUS W/ TWL LRG LVL3 (GOWN DISPOSABLE) ×1 IMPLANT
GOWN STRL REUS W/ TWL XL LVL3 (GOWN DISPOSABLE) ×2 IMPLANT
GOWN STRL REUS W/TWL LRG LVL3 (GOWN DISPOSABLE) ×1
GOWN STRL REUS W/TWL XL LVL3 (GOWN DISPOSABLE) ×2
GUIDEPIN 3.2X17.5 THRD DISP (PIN) ×4 IMPLANT
GUIDEWIRE BEAD TIP (WIRE) ×2 IMPLANT
KIT BASIN OR (CUSTOM PROCEDURE TRAY) ×2 IMPLANT
KIT ROOM TURNOVER OR (KITS) ×2 IMPLANT
MANIFOLD NEPTUNE II (INSTRUMENTS) ×2 IMPLANT
NAIL FEM RETRO 12X360 (Nail) ×2 IMPLANT
NEEDLE 22X1 1/2 (OR ONLY) (NEEDLE) ×2 IMPLANT
NS IRRIG 1000ML POUR BTL (IV SOLUTION) ×2 IMPLANT
PACK GENERAL/GYN (CUSTOM PROCEDURE TRAY) ×2 IMPLANT
PACK UNIVERSAL I (CUSTOM PROCEDURE TRAY) ×2 IMPLANT
PAD ABD 8X10 STRL (GAUZE/BANDAGES/DRESSINGS) ×6 IMPLANT
PAD ARMBOARD 7.5X6 YLW CONV (MISCELLANEOUS) ×4 IMPLANT
SCREW CORT TI DBL LEAD 5X34 (Screw) ×4 IMPLANT
SCREW CORT TI DBL LEAD 5X65 (Screw) ×2 IMPLANT
SCREW CORT TI DBL LEAD 5X80 (Screw) ×4 IMPLANT
SCREW CORT TI DBL LEAD 5X85 (Screw) ×2 IMPLANT
SPONGE GAUZE 4X4 12PLY STER LF (GAUZE/BANDAGES/DRESSINGS) ×2 IMPLANT
STAPLER VISISTAT 35W (STAPLE) ×2 IMPLANT
STOCKINETTE IMPERVIOUS LG (DRAPES) ×2 IMPLANT
SUT VIC AB 0 CT1 27 (SUTURE) ×1
SUT VIC AB 0 CT1 27XBRD ANBCTR (SUTURE) ×1 IMPLANT
SUT VIC AB 0 CTB1 27 (SUTURE) ×2 IMPLANT
SUT VIC AB 1 CT1 27 (SUTURE) ×1
SUT VIC AB 1 CT1 27XBRD ANBCTR (SUTURE) ×1 IMPLANT
SUT VIC AB 2-0 CTB1 (SUTURE) ×2 IMPLANT
SYR CONTROL 10ML LL (SYRINGE) IMPLANT
TOWEL OR 17X24 6PK STRL BLUE (TOWEL DISPOSABLE) ×2 IMPLANT
TOWEL OR 17X26 10 PK STRL BLUE (TOWEL DISPOSABLE) ×2 IMPLANT
WATER STERILE IRR 1000ML POUR (IV SOLUTION) ×2 IMPLANT

## 2016-01-09 NOTE — Brief Op Note (Signed)
01/08/2016 - 01/09/2016  4:27 PM  PATIENT:  Christina Rubio  65 y.o. female  PRE-OPERATIVE DIAGNOSIS:  Femur Fracture Right  POST-OPERATIVE DIAGNOSIS:  Femur Fracture Right  PROCEDURE:  Procedure(s): INTRAMEDULLARY (IM) RETROGRADE FEMORAL NAILING RIGHT DISTAL FEMUR (Right)  SURGEON:  Surgeon(s) and Role:    * Dorna Leitz, MD - Primary  PHYSICIAN ASSISTANT:   ASSISTANTS: Gaspar Skeeters [facility]   ANESTHESIA:   general  EBL:  Total I/O In: 1000 [I.V.:1000] Out: 550 [Urine:50; Blood:500]  BLOOD ADMINISTERED:none  DRAINS: none   LOCAL MEDICATIONS USED:  NONE  SPECIMEN:  No Specimen  DISPOSITION OF SPECIMEN:  N/A  COUNTS:  YES  TOURNIQUET:  * No tourniquets in log *  DICTATION: .Other Dictation: Dictation Number [id number]  PLAN OF CARE: Admit to inpatient   PATIENT DISPOSITION:  PACU - hemodynamically stable.   Delay start of Pharmacological VTE agent (>24hrs) due to surgical blood loss or risk of bleeding: no

## 2016-01-09 NOTE — Anesthesia Procedure Notes (Signed)
Procedure Name: Intubation Date/Time: 01/09/2016 2:19 PM Performed by: Lance Coon Pre-anesthesia Checklist: Patient identified, Emergency Drugs available, Suction available, Patient being monitored and Timeout performed Patient Re-evaluated:Patient Re-evaluated prior to inductionOxygen Delivery Method: Circle system utilized Preoxygenation: Pre-oxygenation with 100% oxygen Intubation Type: IV induction Ventilation: Mask ventilation without difficulty Laryngoscope Size: Glidescope and 4 Grade View: Grade I Tube type: Oral Tube size: 7.5 mm Number of attempts: 1 Airway Equipment and Method: Stylet and Video-laryngoscopy Placement Confirmation: ETT inserted through vocal cords under direct vision,  positive ETCO2 and breath sounds checked- equal and bilateral Secured at: 22 cm Tube secured with: Tape Dental Injury: Teeth and Oropharynx as per pre-operative assessment

## 2016-01-09 NOTE — Anesthesia Postprocedure Evaluation (Signed)
Anesthesia Post Note  Patient: Christina Rubio  Procedure(s) Performed: Procedure(s) (LRB): INTRAMEDULLARY (IM) RETROGRADE FEMORAL NAILING RIGHT DISTAL FEMUR (Right)  Patient location during evaluation: PACU Anesthesia Type: General Level of consciousness: awake and alert Pain management: pain level controlled Vital Signs Assessment: post-procedure vital signs reviewed and stable Respiratory status: spontaneous breathing, nonlabored ventilation, respiratory function stable and patient connected to nasal cannula oxygen Cardiovascular status: blood pressure returned to baseline and stable Postop Assessment: no signs of nausea or vomiting Anesthetic complications: no    Last Vitals:  Vitals:   01/08/16 2136 01/09/16 0511  BP: (!) 146/74 (!) 114/52  Pulse: 61 (!) 47  Resp: 15 16  Temp: 36.6 C 36.7 C    Last Pain:  Vitals:   01/09/16 1200  TempSrc:   PainSc: [place]

## 2016-01-09 NOTE — Progress Notes (Signed)
Received patient from [facility] ED via [place].  Patient AOx4, VS stable with elevated BP at 146/74, pain at 3/10 and O2Sat at 98% on RA.  Patient oriented to room, bed controls and call light.  CNA gave patient ice cold water to drink. Will monitor.

## 2016-01-09 NOTE — Transfer of Care (Signed)
Immediate Anesthesia Transfer of Care Note  Patient: Christina Rubio  Procedure(s) Performed: Procedure(s): INTRAMEDULLARY (IM) RETROGRADE FEMORAL NAILING RIGHT DISTAL FEMUR (Right)  Patient Location: PACU  Anesthesia Type:General  Level of Consciousness: awake, alert , oriented and patient cooperative  Airway & Oxygen Therapy: Patient Spontanous Breathing and Patient connected to nasal cannula oxygen  Post-op Assessment: Report given to RN and Post -op Vital signs reviewed and stable  Post vital signs: Reviewed and stable  Last Vitals:  Vitals:   01/08/16 2136 01/09/16 0511  BP: (!) 146/74 (!) 114/52  Pulse: 61 (!) 47  Resp: 15 16  Temp: 36.6 C 36.7 C    Last Pain:  Vitals:   01/09/16 1200  TempSrc:   PainSc: 5          Complications: No apparent anesthesia complications

## 2016-01-09 NOTE — Progress Notes (Signed)
Reason for Consult: Supracondylar femur fracture right leg Referring Physician: Tania Ade  Christina Rubio is an 65 y.o. female.  HPI: The patient is a morbidly obese woman who fell in a right leg yesterday. She was admitted by my partner with diagnosis of supracondylar femur fracture. He was leaving town and was not able to get the surgery performed. He consult May and I felt that retrograde femoral rod would be appropriate and they admitted the patient and I counseled on her this morning for evaluation and treatment of this femur fracture.  Past Medical History:  Diagnosis Date  . Hypertension   . PONV (postoperative nausea and vomiting)   . Spinal stenosis   . Thyroid disease     Past Surgical History:  Procedure Laterality Date  . CESAREAN SECTION    . CHOLECYSTECTOMY    . COLON SURGERY    . LAPAROSCOPIC GASTRIC SLEEVE RESECTION    . Right thyroidectomy    . TONSILLECTOMY      History reviewed. No pertinent family history.  Social History:  reports that she has never smoked. She has never used smokeless tobacco. She reports that she does not drink alcohol or use drugs.  Allergies:  Allergies  Allergen Reactions  . Epinephrine Palpitations    faint  . Penicillins Rash    Has patient had a PCN reaction causing immediate rash, facial/tongue/throat swelling, SOB or lightheadedness with hypotension:  Yes Has patient had a PCN reaction causing severe rash involving mucus membranes or skin necrosis: No Has patient had a PCN reaction that required hospitalization: No Has patient had a PCN reaction occurring within the last 10 years: No If all of the above answers are "NO", then may proceed with Cephalosporin use.;    Medications: I have reviewed the patient's current medications.  Results for orders placed or performed during the hospital encounter of 01/08/16 (from the past 48 hour(s))  Urinalysis, Routine w reflex microscopic (not at [facility])     Status: None   Collection  Time: 01/08/16  2:56 PM  Result Value Ref Range   Color, Urine YELLOW YELLOW   APPearance CLEAR CLEAR   Specific Gravity, Urine 1.016 1.005 - 1.030   pH 5.5 5.0 - 8.0   Glucose, UA NEGATIVE NEGATIVE mg/dL   Hgb urine dipstick NEGATIVE NEGATIVE   Bilirubin Urine NEGATIVE NEGATIVE   Ketones, ur NEGATIVE NEGATIVE mg/dL   Protein, ur NEGATIVE NEGATIVE mg/dL   Nitrite NEGATIVE NEGATIVE   Leukocytes, UA NEGATIVE NEGATIVE    Comment: MICROSCOPIC NOT DONE ON URINES WITH NEGATIVE PROTEIN, BLOOD, LEUKOCYTES, NITRITE, OR GLUCOSE <1000 mg/dL.  Basic metabolic panel     Status: Abnormal   Collection Time: 01/08/16  4:08 PM  Result Value Ref Range   Sodium 139 135 - 145 mmol/L   Potassium 3.5 3.5 - 5.1 mmol/L   Chloride 106 101 - 111 mmol/L   CO2 25 22 - 32 mmol/L   Glucose, Bld 121 (H) 65 - 99 mg/dL   BUN 19 6 - 20 mg/dL   Creatinine, Ser 0.83 0.44 - 1.00 mg/dL   Calcium 9.1 8.9 - 10.3 mg/dL   GFR calc non Af Amer >60 >60 mL/min   GFR calc Af Amer >60 >60 mL/min    Comment: (NOTE) The eGFR has been calculated using the CKD EPI equation. This calculation has not been validated in all clinical situations. eGFR's persistently <60 mL/min signify possible Chronic Kidney Disease.    Anion gap 8 5 - 15  CBC with Differential     Status: Abnormal   Collection Time: 01/08/16  4:08 PM  Result Value Ref Range   WBC 14.6 (H) 4.0 - 10.5 K/uL   RBC 4.26 3.87 - 5.11 MIL/uL   Hemoglobin 12.8 12.0 - 15.0 g/dL   HCT 38.2 36.0 - 46.0 %   MCV 89.7 78.0 - 100.0 fL   MCH 30.0 26.0 - 34.0 pg   MCHC 33.5 30.0 - 36.0 g/dL   RDW 13.5 11.5 - 15.5 %   Platelets 191 150 - 400 K/uL   Neutrophils Relative % 83 %   Neutro Abs 12.2 (H) 1.7 - 7.7 K/uL   Lymphocytes Relative 11 %   Lymphs Abs 1.7 0.7 - 4.0 K/uL   Monocytes Relative 5 %   Monocytes Absolute 0.7 0.1 - 1.0 K/uL   Eosinophils Relative 1 %   Eosinophils Absolute 0.1 0.0 - 0.7 K/uL   Basophils Relative 0 %   Basophils Absolute 0.0 0.0 - 0.1 K/uL   Surgical pcr screen     Status: Abnormal   Collection Time: 01/08/16 10:20 PM  Result Value Ref Range   MRSA, PCR NEGATIVE NEGATIVE   Staphylococcus aureus POSITIVE (A) NEGATIVE    Comment:        The Xpert SA Assay (FDA approved for NASAL specimens in patients over 69 years of age), is one component of a comprehensive surveillance program.  Test performance has been validated by [facility] for patients greater than or equal to 80 year old. It is not intended to diagnose infection nor to guide or monitor treatment.     Dg Chest Portable 1 View  Result Date: 01/08/2016 CLINICAL DATA:  65 year old female right femur fracture. Preoperative study. Initial encounter. EXAM: PORTABLE CHEST 1 VIEW COMPARISON:  None. FINDINGS: Portable AP semi upright view at 1537 hours. Mild to moderate cardiomegaly. Calcified aortic atherosclerosis. Other mediastinal contours are within normal limits. Visualized tracheal air column is within normal limits. Allowing for portable technique the lungs are clear. IMPRESSION: No acute cardiopulmonary abnormality. Cardiomegaly and calcified aortic atherosclerosis. Electronically Signed   By: Genevie Ann M.D.   On: 01/08/2016 15:58   Dg Knee Complete 4 Views Right  Result Date: 01/08/2016 CLINICAL DATA:  Right knee pain after fall today. EXAM: RIGHT KNEE - COMPLETE 4+ VIEW COMPARISON:  None. FINDINGS: Severely displaced fracture is seen involving the distal right femur. Severe narrowing of the medial joint space is noted. Mild narrowing of lateral joint space is noted. No soft tissue abnormality is noted. IMPRESSION: Severely displaced distal right femoral fracture. Electronically Signed   By: Marijo Conception, M.D.   On: 01/08/2016 15:15   Dg Hip Unilat W Or Wo Pelvis 2-3 Views Right  Result Date: 01/08/2016 CLINICAL DATA:  Right knee pain after fall today. EXAM: DG HIP (WITH OR WITHOUT PELVIS) 2-3V RIGHT COMPARISON:  None. FINDINGS: There is no evidence of hip  fracture or dislocation. There is no evidence of arthropathy or other focal bone abnormality. IMPRESSION: Normal right hip. Electronically Signed   By: Marijo Conception, M.D.   On: 01/08/2016 15:17    ROS  ROS: I have reviewed the patient's review of systems thoroughly and there are no positive responses as relates to the HPI. Blood pressure (!) 114/52, pulse (!) 47, temperature 98 F (36.7 C), temperature source Oral, resp. rate 16, height '5\' 5"'$  (1.651 m), weight (!) 154.2 kg (340 lb), SpO2 99 %. Physical Exam Well-developed well-nourished patient in  no acute distress. Alert and oriented x3 HEENT:within normal limits Cardiac: Regular rate and rhythm Pulmonary: Lungs clear to auscultation Abdomen: Soft and nontender.  Normal active bowel sounds  Musculoskeletal: (Right lower extremity massively swollen and sitting in a flexed position. She is neurovascular intact distally. She has pain with all range of motion. Assessment/Plan: 65 year old morbidly obese female with supracondylar femur fracture right side. With a long discussion of treatment options but felt that even though this will be a very complex and difficult case that her past option is surgical intervention. She is taken operating room for intramedullary rod fixation. She understands the risks and benefits of surgery. She understands the risks that she could have significant bleeding with need for transfusion. She understands the risk of infection which is greater than normal because of her morbid obesity. She understands the risk of death at the time of surgery in the perioperative period because of her obesity and complex nature of her fracture. She understands these risks and wishes to proceed.  Maame Dack L 01/09/2016, 2:01 PM

## 2016-01-09 NOTE — Progress Notes (Signed)
Washed face

## 2016-01-09 NOTE — Anesthesia Preprocedure Evaluation (Signed)
Anesthesia Evaluation  Patient identified by MRN, date of birth, ID band Patient awake    Reviewed: Allergy & Precautions, NPO status , Patient's Chart, lab work & pertinent test results  History of Anesthesia Complications (+) PONV  Airway Mallampati: II  TM Distance: >3 FB Neck ROM: Full    Dental   Pulmonary    Pulmonary exam normal        Cardiovascular hypertension, Pt. on medications Normal cardiovascular exam     Neuro/Psych    GI/Hepatic   Endo/Other    Renal/GU      Musculoskeletal   Abdominal   Peds  Hematology   Anesthesia Other Findings   Reproductive/Obstetrics                             Anesthesia Physical Anesthesia Plan  ASA: III  Anesthesia Plan: General   Post-op Pain Management:    Induction: Intravenous  Airway Management Planned: Oral ETT  Additional Equipment:   Intra-op Plan:   Post-operative Plan: Extubation in OR  Informed Consent: I have reviewed the patients History and Physical, chart, labs and discussed the procedure including the risks, benefits and alternatives for the proposed anesthesia with the patient or authorized representative who has indicated his/her understanding and acceptance.     Plan Discussed with: CRNA and Surgeon  Anesthesia Plan Comments:         Anesthesia Quick Evaluation

## 2016-01-10 ENCOUNTER — Encounter (HOSPITAL_COMMUNITY): Payer: Self-pay | Admitting: Orthopedic Surgery

## 2016-01-10 LAB — CBC
HEMATOCRIT: 29.2 % — AB (ref 36.0–46.0)
Hemoglobin: 9.2 g/dL — ABNORMAL LOW (ref 12.0–15.0)
MCH: 28.3 pg (ref 26.0–34.0)
MCHC: 31.5 g/dL (ref 30.0–36.0)
MCV: 89.8 fL (ref 78.0–100.0)
Platelets: 165 10*3/uL (ref 150–400)
RBC: 3.25 MIL/uL — ABNORMAL LOW (ref 3.87–5.11)
RDW: 13.8 % (ref 11.5–15.5)
WBC: 11 10*3/uL — AB (ref 4.0–10.5)

## 2016-01-10 LAB — BASIC METABOLIC PANEL
ANION GAP: 6 (ref 5–15)
BUN: 22 mg/dL — ABNORMAL HIGH (ref 6–20)
CALCIUM: 8.2 mg/dL — AB (ref 8.9–10.3)
CO2: 28 mmol/L (ref 22–32)
Chloride: 105 mmol/L (ref 101–111)
Creatinine, Ser: 1.29 mg/dL — ABNORMAL HIGH (ref 0.44–1.00)
GFR, EST AFRICAN AMERICAN: 50 mL/min — AB (ref >60)
GFR, EST NON AFRICAN AMERICAN: 43 mL/min — AB (ref >60)
Glucose, Bld: 99 mg/dL (ref 65–99)
Potassium: 3.9 mmol/L (ref 3.5–5.1)
SODIUM: 139 mmol/L (ref 135–145)

## 2016-01-10 MED ORDER — ENOXAPARIN SODIUM 80 MG/0.8ML ~~LOC~~ SOLN
0.5000 mg/kg | SUBCUTANEOUS | Status: DC
  Administered 2016-01-10 – 2016-01-14 (×5): 75 mg via SUBCUTANEOUS
  Filled 2016-01-10 (×5): qty 0.8

## 2016-01-10 NOTE — Clinical Social Work Note (Addendum)
Clinical Social Work Assessment  Patient Details  Name: Christina Rubio MRN: [id number] Date of Birth: 1950-06-25  Date of referral:  01/10/16               Reason for consult:  Facility Placement                Permission sought to share information with:    Permission granted to share information::     Name::     Levada Dy  Agency::     Relationship::  Daughter  Contact Information:  [phone]  Housing/Transportation Living arrangements for the past 2 months:  Single Family Home Source of Information:  Patient Patient Interpreter Needed:  None Criminal Activity/Legal Involvement Pertinent to Current Situation/Hospitalization:  No - Comment as needed Significant Relationships:  Adult Children Lives with:  Adult Children Do you feel safe going back to the place where you live?  Yes Need for family participation in patient care:  Yes (Comment)  Care giving concerns:  No family or friends at bedside at this time.    Social Worker assessment / plan:  CSW spoke with patient at bedside. Patient lives at home with her daughter, daughter's husband, and their 66 month old baby. Daughter is receiving chemo so per patient daughter's husband is taking care of patient, patient's daughter, and baby. Patient reports she receives a lot of support for her family. Patient agrees to SNF placement at discharge. Patient is unfamiliar with [place] facilities and ask that CSW send referral to all SNF's in [facility] Patient reports her house in off of [address] and would prefer a facility near that location. CSW will d provide bed offers when available and continue to facilitate placement.  Employment status:  Retired Insurance underwriter information:  [employer] PT Recommendations:  [place] / Referral to community resources:  [place]  Patient/Family's Response to care:  Patient verbalize understanding of CSW role and appreciation of  support.  Patient/Family's Understanding of and Emotional Response to Diagnosis, Current Treatment, and Prognosis:  Patient understanding and realistic about physical limitations and understands need for SNF at discharge. Patient was positive and hopeful.   Emotional Assessment Appearance:  Appears stated age Attitude/Demeanor/Rapport:   (Patient was appropriate) Affect (typically observed):  Accepting, Appropriate, Pleasant Orientation:  Oriented to Self, Oriented to Place, Oriented to  Time, Oriented to Situation Alcohol / Substance use:  Not Applicable Psych involvement (Current and /or in the community):  No (Comment)  Discharge Needs  Concerns to be addressed:  No discharge needs identified Readmission within the last 30 days:  No Current discharge risk:  Dependent with Mobility Barriers to Discharge:  Continued Medical Work up   [employer], [place]

## 2016-01-10 NOTE — NC FL2 (Signed)
[place] MEDICAID FL2 LEVEL OF CARE SCREENING TOOL     IDENTIFICATION  Patient Name: Christina Rubio Birthdate: 03-27-1950 Sex: female Admission Date (Current Location): 01/08/2016  [facility] and [place] Number:  Herbalist and Address:  [facility] [place] [place] [address], [place], [address] [address]      Provider Number: [id number]  Attending Physician Name and Address:  Tania Ade, MD  Relative Name and Phone Number:  Levada Dy [phone]    Current Level of Care: Hospital Recommended Level of Care: [place] Prior Approval Number:    Date Approved/Denied: 01/10/16 PASRR Number: [id number]  Discharge Plan: SNF    Current Diagnoses: Patient Active Problem List   Diagnosis Date Noted  . Morbid obesity ([place]) 01/09/2016  . Displaced supracondylar fracture of distal end of right femur without intracondylar extension ([place]) 01/08/2016    Orientation RESPIRATION BLADDER Height & Weight     Self, Time, Situation, Place  O2 (Nasal Cannual, 2L) Incontinent Weight: (!) 340 lb (154.2 kg) Height:  5\' 5"  (165.1 cm)  BEHAVIORAL SYMPTOMS/MOOD NEUROLOGICAL BOWEL NUTRITION STATUS      Continent  (Regular diet; thin liquids)  AMBULATORY STATUS COMMUNICATION OF NEEDS Skin   Extensive Assist Verbally Surgical wounds (Closed incision, right leg, compression wrap)                       Personal Care Assistance Level of Assistance  Bathing, Feeding, Dressing Bathing Assistance: Limited assistance Feeding assistance: Independent Dressing Assistance: Limited assistance     Functional Limitations Info  Sight, Hearing, Speech Sight Info: Adequate Hearing Info: Adequate Speech Info: Adequate    SPECIAL CARE FACTORS FREQUENCY  PT (By licensed PT), OT (By licensed OT)     PT Frequency: 3x week OT Frequency: 3x week            Contractures Contractures Info: Not present    Additional Factors Info  Code Status, Allergies  Code Status Info: Full Allergies Info: Epinephrine, Penicillins           Current Medications (01/10/2016):  This is the current hospital active medication list Current Facility-Administered Medications  Medication Dose Route Frequency Provider Last Rate Last Dose  . 0.9 % NaCl with KCl 20 mEq/ L  infusion   Intravenous Continuous Grier Mitts, PA-C 100 mL/hr at 01/10/16 0106    . acetaminophen (TYLENOL) tablet 1,000 mg  1,000 mg Oral Q6H Danielle Laliberte, PA-C   1,000 mg at 01/10/16 0530  . ALPRAZolam Duanne Moron) tablet 0.5 mg  0.5 mg Oral QHS PRN Grier Mitts, PA-C   0.5 mg at 01/08/16 1936  . alum & mag hydroxide-simeth (MAALOX/MYLANTA) 200-200-20 MG/5ML suspension 30 mL  30 mL Oral Q4H PRN Gary Fleet, PA-C      . atorvastatin (LIPITOR) tablet 10 mg  10 mg Oral QHS Grier Mitts, PA-C   10 mg at 01/08/16 2158  . bisacodyl (DULCOLAX) suppository 10 mg  10 mg Rectal Daily PRN Grier Mitts, PA-C      . buPROPion (WELLBUTRIN XL) 24 hr tablet 300 mg  300 mg Oral QHS Grier Mitts, PA-C   300 mg at 01/08/16 2158  . ceFAZolin (ANCEF) IVPB 2g/100 mL premix  2 g Intravenous Q8H Gary Fleet, PA-C   2 g at 01/10/16 0531  . Chlorhexidine Gluconate Cloth 2 % PADS 6 each  6 each Topical Daily Dorna Leitz, MD   6 each at 01/10/16 0935  . diphenhydrAMINE (BENADRYL) 12.5 MG/5ML elixir  12.5-25 mg  12.5-25 mg Oral Q4H PRN Grier Mitts, PA-C      . docusate sodium (COLACE) capsule 100 mg  100 mg Oral BID Grier Mitts, PA-C   100 mg at 01/10/16 0935  . enoxaparin (LOVENOX) injection 40 mg  40 mg Subcutaneous Q24H Gary Fleet, PA-C   40 mg at 01/10/16 0754  . ferrous sulfate tablet 325 mg  325 mg Oral BID WC Gary Fleet, PA-C   325 mg at 01/10/16 0753  . gabapentin (NEURONTIN) tablet 600 mg  600 mg Oral TID PRN Grier Mitts, PA-C   600 mg at 01/08/16 2205  . hydrochlorothiazide (HYDRODIURIL) tablet 25 mg  25 mg Oral QHS Tania Ade, MD   25 mg at 01/08/16  2159  . irbesartan (AVAPRO) tablet 150 mg  150 mg Oral QHS Tania Ade, MD   150 mg at 01/08/16 2200  . levothyroxine (SYNTHROID, LEVOTHROID) tablet 50 mcg  50 mcg Oral QHS Grier Mitts, PA-C   50 mcg at 01/08/16 2200  . methocarbamol (ROBAXIN) tablet 500 mg  500 mg Oral Q6H PRN Gary Fleet, PA-C   500 mg at 01/10/16 0948   Or  . methocarbamol (ROBAXIN) 500 mg in dextrose 5 % 50 mL IVPB  500 mg Intravenous Q6H PRN Gary Fleet, PA-C      . metoprolol succinate (TOPROL-XL) 24 hr tablet 100 mg  100 mg Oral QHS Grier Mitts, PA-C   100 mg at 01/08/16 2200  . morphine 2 MG/ML injection 1-2 mg  1-2 mg Intravenous Q2H PRN Grier Mitts, PA-C   2 mg at 01/08/16 2103  . mupirocin ointment (BACTROBAN) 2 % 1 application  1 application Nasal BID Dorna Leitz, MD   1 application at [id number]  . ondansetron (ZOFRAN) tablet 4 mg  4 mg Oral Q6H PRN Gary Fleet, PA-C       Or  . ondansetron [facility]) injection 4 mg  4 mg Intravenous Q6H PRN Gary Fleet, PA-C      . oxyCODONE (Oxy IR/ROXICODONE) immediate release tablet 5-10 mg  5-10 mg Oral Q3H PRN Grier Mitts, PA-C   10 mg at 01/10/16 0948  . polyethylene glycol (MIRALAX / GLYCOLAX) packet 17 g  17 g Oral Daily PRN Grier Mitts, PA-C      . sertraline (ZOLOFT) tablet 50 mg  50 mg Oral QHS Danielle Laliberte, PA-C   50 mg at 01/08/16 2200  . sodium phosphate (FLEET) 7-19 GM/118ML enema 1 enema  1 enema Rectal Once PRN Grier Mitts, PA-C      . zolpidem (AMBIEN) tablet 5 mg  5 mg Oral QHS PRN Gary Fleet, PA-C         Discharge Medications: Please see discharge summary for a list of discharge medications.  Relevant Imaging Results:  Relevant Lab Results:   Additional Information SSN: [id number]  Sela Hilding, [place]

## 2016-01-10 NOTE — Progress Notes (Signed)
Subjective: 1 Day Post-Op Procedure(s) (LRB): INTRAMEDULLARY (IM) RETROGRADE FEMORAL NAILING RIGHT DISTAL FEMUR (Right) Patient reports pain as mild.    Objective: Vital signs in last 24 hours: Temp:  [97.3 F (36.3 C)-100.3 F (37.9 C)] 98.8 F (37.1 C) (10/19 0600) Pulse Rate:  [52-71] 71 (10/19 0525) Resp:  [10-18] 17 (10/19 0525) BP: (111-158)/(44-80) 117/44 (10/19 0525) SpO2:  [96 %-100 %] 99 % (10/19 0525)  Intake/Output from previous day: 10/18 0701 - 10/19 0700 In: 2300 [I.V.:2050; IV Piggyback:250] Out: 770 [Urine:270; Blood:500] Intake/Output this shift: No intake/output data recorded.   Recent Labs  01/08/16 1608 01/09/16 1900 01/10/16 0459  HGB 12.8 10.2* 9.2*    Recent Labs  01/09/16 1900 01/10/16 0459  WBC 15.8* 11.0*  RBC 3.49* 3.25*  HCT 31.2* 29.2*  PLT 174 165    Recent Labs  01/08/16 1608 01/09/16 1900  NA 139  --   K 3.5  --   CL 106  --   CO2 25  --   BUN 19  --   CREATININE 0.83 1.25*  GLUCOSE 121*  --   CALCIUM 9.1  --    No results for input(s): LABPT, INR in the last 72 hours.  Neurologically intact ABD soft Neurovascular intact Sensation intact distally Intact pulses distally Compartment soft  Assessment/Plan: 1 Day Post-Op Procedure(s) (LRB): INTRAMEDULLARY (IM) RETROGRADE FEMORAL NAILING RIGHT DISTAL FEMUR (Right) In a morbidly obese female Advance diet Up with therapy Discharge to SNF in next day or two. Will keep on Lovenox 40 mg subcutaneous daily while in hospital and for 2 weeks post discharge.  Christina Rubio 01/10/2016, 8:10 AM

## 2016-01-10 NOTE — Evaluation (Signed)
Occupational Therapy Evaluation Patient Details Name: Christina Rubio MRN: [id number] DOB: 01-15-1951 Today's Date: 01/10/2016    History of Present Illness Pt is 65 y/o female presenting after fall in home where she fractured her right femur. S/p INTRAMEDULLARY (IM) RETROGRADE FEMORAL NAILING RIGHT DISTAL FEMUR (Right).Pt has a past medical history of morbid obesity, Hypertension;Spinal stenosis; and Thyroid disease.   Clinical Impression   PTA Pt independent in ADL and dependent in IADL. Pt using rolling walker for mobility PTA. Pt currently bed level ADL with max assist for LB. Pt +2 assist for mobility (Pt would benefit from 3rd staff member to assure weight bearing precautions). Pt with below deficits in self care tasks, and will benefit from skilled OT in the acute care setting prior to discharge to SNF to maximize independence in ADL. OT to follow.    Follow Up Recommendations  SNF    Equipment Recommendations  Other (comment) (TBD by next venue of care)    Recommendations for Other Services       Precautions / Restrictions Precautions Precautions: Fall Restrictions Weight Bearing Restrictions: Yes RLE Weight Bearing: Non weight bearing Other Position/Activity Restrictions: Per Ortho NWBing, however TDWB is acceptable.      Mobility Bed Mobility Overal bed mobility: Needs Assistance;+2 for physical assistance Bed Mobility: Supine to Sit;Sit to Supine;Rolling Rolling: Min assist;+2 for physical assistance   Supine to sit: Mod assist;+2 for physical assistance Sit to supine: Min assist;+2 for physical assistance   General bed mobility comments: pt participates well with mobility, but needs A for bringing trunk up to sitting and bringing R LE to EOB, as well as returning R LE to bed.  pt does well utilizing bed rail for bed mobility.    Transfers Overall transfer level: Needs assistance Equipment used: 2 person hand held assist Transfers: Sit to/from Stand Sit to  Stand: Mod assist;+2 physical assistance         General transfer comment: pt anxious about mobility, but follows cueing well for attempting to stand.  Trialed coming to stand x2 with 2nd time pt being able to come mostly to standing and indicated only placing R toes on the floor.  Used pad under hips to A with power up to standing.  Would benefit from 3rd person to assure weightbearing.      Balance Overall balance assessment: Needs assistance Sitting-balance support: Bilateral upper extremity supported;Feet supported Sitting balance-Leahy Scale: Poor Sitting balance - Comments: pt leans to L side to off load R hip.   Standing balance support: Bilateral upper extremity supported;During functional activity Standing balance-Leahy Scale: Poor                              ADL Overall ADL's : Needs assistance/impaired     Grooming: Wash/dry face;Oral care;Set up;Bed level   Upper Body Bathing: Set up;Bed level   Lower Body Bathing: Maximal assistance;Bed level   Upper Body Dressing : Moderate assistance;Bed level   Lower Body Dressing: Total assistance;Bed level Lower Body Dressing Details (indicate cue type and reason): don socks             Functional mobility during ADLs:  (attempted sit to stand x2, no mobility during this session) General ADL Comments: Pt very willing to work with therapy     Vision     Perception     Praxis      Pertinent Vitals/Pain Pain Assessment: 0-10 Pain Score: 5  Pain Location: R hip and thigh Pain Descriptors / Indicators: Aching;Grimacing;Guarding Pain Intervention(s): Monitored during session;Premedicated before session;Repositioned     Hand Dominance     Extremity/Trunk Assessment Upper Extremity Assessment Upper Extremity Assessment: Overall WFL for tasks assessed (to assist with bed mobility)   Lower Extremity Assessment Lower Extremity Assessment: Defer to PT evaluation RLE Deficits / Details: Sensation  intact, PROM functional, but limited by body habitus.  Strength grossly limited post-op.   RLE Coordination: decreased fine motor;decreased gross motor   Cervical / Trunk Assessment Cervical / Trunk Assessment: Normal;Other exceptions Cervical / Trunk Exceptions: spinal stenosis   Communication Communication Communication: No difficulties   Cognition Arousal/Alertness: Awake/alert Behavior During Therapy: WFL for tasks assessed/performed Overall Cognitive Status: Within Functional Limits for tasks assessed                     General Comments       Exercises       Shoulder Instructions      Home Living Family/patient expects to be discharged to:: Skilled nursing facility Living Arrangements: Children (Daughter (who has health issues) and 64 month old grandson)                                      Prior Functioning/Environment Level of Independence: Independent with assistive device(s);Needs assistance  Gait / Transfers Assistance Needed: Uses RW as needed. ADL's / Homemaking Assistance Needed: A with homemaking tasks, but performs ADLs without A.              OT Problem List: Decreased strength;Decreased range of motion;Decreased activity tolerance;Impaired balance (sitting and/or standing);Decreased knowledge of use of DME or AE;Decreased knowledge of precautions;Obesity;Pain   OT Treatment/Interventions: Self-care/ADL training;Energy conservation;DME and/or AE instruction;Therapeutic activities;Patient/family education;Balance training    OT Goals(Current goals can be found in the care plan section) Acute Rehab OT Goals Patient Stated Goal: Home to play with grandson OT Goal Formulation: With patient Time For Goal Achievement: 01/17/16 Potential to Achieve Goals: Good ADL Goals Pt Will Perform Grooming: with modified independence;sitting Pt Will Transfer to Toilet: with min assist;bedside commode Pt Will Perform Toileting - Clothing  Manipulation and hygiene: with modified independence;sit to/from stand Additional ADL Goal #1: Pt will maintain NWB/TDWB during functional transfers surrounding ADL activities with no verbal cues  OT Frequency: Min 2X/week   Barriers to D/C: Decreased caregiver support;Inaccessible home environment          Co-evaluation PT/OT/SLP Co-Evaluation/Treatment: Yes Reason for Co-Treatment: For patient/therapist safety PT goals addressed during session: Mobility/safety with mobility OT goals addressed during session: ADL's and self-care      End of Session Equipment Utilized During Treatment: Gait belt;Oxygen Nurse Communication: Weight bearing status  Activity Tolerance: Patient tolerated treatment well Patient left: in bed;with call bell/phone within reach   Time: 1136-1215 OT Time Calculation (min): 39 min Charges:  OT General Charges $OT Visit: 1 Procedure OT Evaluation $OT Eval Moderate Complexity: 1 Procedure G-Codes:    Merri Ray Maryclare Nydam 2016/02/04, 2:37 PM Hulda Humphrey OTR/L [phone]

## 2016-01-10 NOTE — Op Note (Signed)
[name], WETTSTEIN NO.:  [id number]  MEDICAL RECORD NO.:  [id number]  LOCATION:  6N09C                        FACILITY:  [facility]  PHYSICIAN:  Alta Corning, M.D.   DATE OF BIRTH:  December 21, 1950  DATE OF PROCEDURE:  01/09/2016 DATE OF DISCHARGE:                              OPERATIVE REPORT   PREOPERATIVE DIAGNOSES: 1. Supracondylar femur fracture, right. 2. Morbid obesity. 3. Multiple osteocartilaginous loose bodies, right knee.  POSTOPERATIVE DIAGNOSES: 1. Supracondylar femur fracture, right. 2. Morbid obesity. 3. Multiple osteocartilaginous loose bodies, right knee.  PROCEDURES: 1. Severely complicated retrograde femoral nailing in a morbidly obese     female with a [place] nail size 12 x 36 cm with four distal locking     screws and two freehand and proximal locking screws. 2. Removal of cartilaginous loose bodies from the right knee, open. 3. Interpretation of multiple intraoperative fluoroscopic images.  SURGEON:  Alta Corning, M.D.  ASSISTANT:  Gary Fleet, P.A.  ANESTHESIA:  General.  BRIEF HISTORY:  Christina Rubio is a 65 year old female with a history of morbid obesity.  Unfortunately, she fell the day prior to surgery.  She was evaluated by Dr. Tamera Punt, who felt that she needed fixation, he was not able to do the surgery because of leaving town.  We were consulted for evaluation and management.  We had a long discussion with the patient about her complexity of her case based on the fracture pattern as well as her size and after that thorough discussion, she elected to undergo open reduction and internal fixation with intramedullary nail. She was brought to the operating room for this procedure.  DESCRIPTION OF PROCEDURE:  The patient was brought to the operating room.  After adequate anesthesia was obtained with general anesthetic, the patient was placed supine on the operating table.  We had to move her with a hovercraft because of her  morbid obesity.  Once positioned, we had to take additional time to get the patient in a safe position with sidebars and at this point again with some difficulty, the right leg was prepped and draped in the usual sterile fashion.  Once this was done, we did get her draped out.  Attention was then turned towards the right knee.  We put her up on an angled triangle and we were able to get some images.  We had to do manipulative closed reduction at this point and we were able to get it improved distal position at this point under fluoroscopic guidance.  Because of her large size, I could not palpate her kneecap or patellar tendon.  With fluoroscopic guidance, we did make an incision and dissected down to the level of the patellar tendon. Once this was done, we opened the central part of the patellar tendon and then did some excision of the retropatellar fat pad.  Once this was done, we put a guidewire centrally and the notch.  She had a dramatically stenotic notch with significant osteophytic overgrowth.  We were able to get a guidewire in the central portion on both AP and lateral, over-reamed this and then put a guidewire proximally. Sequentially reamed, we were getting pretty heavy chatter at 13.5,  so we decided to use a 12 nail and with the leg held in a reduced position, we did put a 12 x 36 nail in and then again under very difficult situation because of her size and distance from the skin to the bone, we were able to put in four interlocking screws.  We wanted them long because we want a good purchase because the [facility] nail does allow some locking option, so we put them in purposely slightly long and then checked them under fluoro to make sure they were in fact in the rod and had good bicortical purchase.  Once this was confirmed, we did lock in the distal portion of the nail.  At this point, we moved proximally and under freehand technique with significant difficulty because of her  massive size and folds of fat, we re-prepped her approximately.  Once this was accomplished, we went up to get the proximal locks.  So, the first proximal lock, we stripped the screw, putting it in, happily we had engaged the back cortex, we got additional better screwdriver and we were able to significantly engage the back cortex, but unfortunately that was a situation where there was a stripped screw.  We talked about trying to put it in or out, but felt like at that point, we had good purchase, we wanted to accomplish and knew that it would not bother, so we left it.  We then put a second screw in and got good purchase on this and a better fixation.  Once this was done, final images were taken.  Once the rod, we were satisfied with the placement of the rod.  We did make an arthrotomy laterally through one of the poke holes for the screw and took out two large osteocartilaginous loose bodies, which obviously would have been problematic for the patient, these were removed with Kocher through this arthrotomy incision.  At this point, the wounds were all irrigated, suctioned dry.  We closed the patellar tendon with 1 Vicryl running, closed the proximal wound with 0 Vicryl and skin staples, closed the distal wound with 0 Vicryl and skin staples on each of the poke holes for the screws with 0 Vicryl and skin staples. Sterile compressive dressing was applied at this point and the patient was taken to the recovery room, she was noted to be in satisfactory condition.  Estimated blood loss for the procedure was probably 350 mL, the accurate numbers can be gotten from her anesthetic record.  At this case, took twice the time of a normal case because of the complexity of the fracture, difficulty with the morbid obesity and difficulty with fluoro because of her large size.     Alta Corning, M.D.     Corliss Skains  D:  01/09/2016  T:  01/10/2016  Job:  [id number]

## 2016-01-10 NOTE — Evaluation (Signed)
Physical Therapy Evaluation Patient Details Name: Christina Rubio MRN: [id number] DOB: September 30, 1950 Today's Date: 01/10/2016   History of Present Illness  Pt is 65 y/o female presenting after fall in home where she fractured her right femur. S/p INTRAMEDULLARY (IM) RETROGRADE FEMORAL NAILING RIGHT DISTAL FEMUR (Right).Pt has a past medical history of morbid obesity, Hypertension;Spinal stenosis; and Thyroid disease.  Clinical Impression  Pt is very motivated, but mobility is limited due to body habitus and pt being NWBing.  Pt indicates she only placed her R toes on the floor with attempts at standing, but will need to further ensure pt is maintaining weightbearing status.  At this time feel is will require SNF level of care at D/C.  Will continue to follow while on acute.      Follow Up Recommendations SNF    Equipment Recommendations  None recommended by PT    Recommendations for Other Services       Precautions / Restrictions Precautions Precautions: Fall Restrictions Weight Bearing Restrictions: Yes RLE Weight Bearing: Non weight bearing Other Position/Activity Restrictions: Per Ortho NWBing, however TDWB is acceptable.      Mobility  Bed Mobility Overal bed mobility: Needs Assistance;+2 for physical assistance Bed Mobility: Supine to Sit;Sit to Supine;Rolling Rolling: Min assist;+2 for physical assistance   Supine to sit: Mod assist;+2 for physical assistance Sit to supine: Min assist;+2 for physical assistance   General bed mobility comments: pt participates well with mobility, but needs A for bringing trunk up to sitting and bringing R LE to EOB, as well as returning R LE to bed.  pt does well utilizing bed rail for bed mobility.    Transfers Overall transfer level: Needs assistance Equipment used: 2 person hand held assist Transfers: Sit to/from Stand Sit to Stand: Mod assist;+2 physical assistance         General transfer comment: pt anxious about mobility, but  follows cueing well for attempting to stand.  Trialed coming to stand x2 with 2nd time pt being able to come mostly to standing and indicated only placing R toes on the floor.  Used pad under hips to A with power up to standing.  Would benefit from 3rd person to assure weightbearing.    Ambulation/Gait                Stairs            Wheelchair Mobility    Modified Rankin (Stroke Patients Only)       Balance Overall balance assessment: Needs assistance;History of Falls Sitting-balance support: Bilateral upper extremity supported;Feet supported Sitting balance-Leahy Scale: Poor Sitting balance - Comments: pt leans to L side to off load R hip.   Standing balance support: Bilateral upper extremity supported;During functional activity Standing balance-Leahy Scale: Poor                               Pertinent Vitals/Pain Pain Assessment: 0-10 Pain Score: 5  Pain Location: R hip and thigh Pain Descriptors / Indicators: Aching;Grimacing;Guarding Pain Intervention(s): Monitored during session;Premedicated before session;Repositioned    Home Living Family/patient expects to be discharged to:: Skilled nursing facility                      Prior Function Level of Independence: Independent with assistive device(s);Needs assistance   Gait / Transfers Assistance Needed: Uses RW as needed.  ADL's / Homemaking Assistance Needed: A with homemaking tasks, but performs ADLs  without A.          Hand Dominance        Extremity/Trunk Assessment   Upper Extremity Assessment: Defer to OT evaluation           Lower Extremity Assessment: Generalized weakness;RLE deficits/detail RLE Deficits / Details: Sensation intact, PROM functional, but limited by body habitus.  Strength grossly limited post-op.      Cervical / Trunk Assessment: Normal  Communication   Communication: No difficulties  Cognition Arousal/Alertness: Awake/alert Behavior During  Therapy: WFL for tasks assessed/performed Overall Cognitive Status: Within Functional Limits for tasks assessed                      General Comments      Exercises     Assessment/Plan    PT Assessment Patient needs continued PT services  PT Problem List Decreased strength;Decreased activity tolerance;Decreased balance;Decreased mobility;Decreased coordination;Decreased knowledge of use of DME;Obesity;Pain          PT Treatment Interventions DME instruction;Gait training;Functional mobility training;Therapeutic activities;Therapeutic exercise;Balance training;Patient/family education    PT Goals (Current goals can be found in the Care Plan section)  Acute Rehab PT Goals Patient Stated Goal: Home to play with grandson PT Goal Formulation: With patient Time For Goal Achievement: 01/24/16 Potential to Achieve Goals: Good    Frequency Min 3X/week   Barriers to discharge        Co-evaluation PT/OT/SLP Co-Evaluation/Treatment: Yes Reason for Co-Treatment: For patient/therapist safety PT goals addressed during session: Mobility/safety with mobility;Balance         End of Session Equipment Utilized During Treatment: Gait belt Activity Tolerance: Patient limited by fatigue;Patient limited by pain Patient left: in bed;with call bell/phone within reach Nurse Communication: Mobility status         Time: [id number] PT Time Calculation (min) (ACUTE ONLY): 38 min   Charges:   PT Evaluation $PT Eval Moderate Complexity: 1 Procedure PT Treatments $Therapeutic Activity: 8-22 mins   PT G CodesCatarina Hartshorn, [place] [phone] 01/10/2016, 2:18 PM

## 2016-01-11 LAB — CBC
HEMATOCRIT: 28.6 % — AB (ref 36.0–46.0)
HEMOGLOBIN: 9.2 g/dL — AB (ref 12.0–15.0)
MCH: 29.1 pg (ref 26.0–34.0)
MCHC: 32.2 g/dL (ref 30.0–36.0)
MCV: 90.5 fL (ref 78.0–100.0)
Platelets: 160 10*3/uL (ref 150–400)
RBC: 3.16 MIL/uL — ABNORMAL LOW (ref 3.87–5.11)
RDW: 13.9 % (ref 11.5–15.5)
WBC: 12.2 10*3/uL — AB (ref 4.0–10.5)

## 2016-01-11 MED ORDER — FERROUS SULFATE 325 (65 FE) MG PO TABS: 325.0000 mg | ORAL_TABLET | Freq: Every day | ORAL | 3 refills | Status: DC

## 2016-01-11 MED ORDER — ENOXAPARIN SODIUM 150 MG/ML ~~LOC~~ SOLN: 0.5000 mg/kg | SUBCUTANEOUS | 0 refills | Status: DC

## 2016-01-11 NOTE — Discharge Instructions (Signed)
Will need daily physical therapy. Touchdown weightbearing on the right. Keep right knee wound clean and dry. Change dressing every 4 days. Wrap the area with Ace bandage at dressing changes.

## 2016-01-11 NOTE — Progress Notes (Signed)
Physical Therapy Treatment Patient Details Name: Christina Rubio MRN: [id number] DOB: 25-Apr-1950 Today's Date: 01/11/2016    History of Present Illness Pt is 65 y/o female presenting after fall in home where she fractured her right femur. S/p INTRAMEDULLARY (IM) RETROGRADE FEMORAL NAILING RIGHT DISTAL FEMUR (Right).Pt has a past medical history of morbid obesity, Hypertension;Spinal stenosis; and Thyroid disease.    PT Comments    Pt continues to be very motivated despite anxiety with mobility.  Pt follows all cueing and gives good effort.  Pt doing well attempting to minimize weightbearing on R LE and is currently maintaining TDWBing, but is unable to do NWBing.  Continue to feel pt will need SNF level of care and therapies at D/C.    Follow Up Recommendations  SNF     Equipment Recommendations  None recommended by PT    Recommendations for Other Services       Precautions / Restrictions Precautions Precautions: Fall Restrictions Weight Bearing Restrictions: Yes RLE Weight Bearing: Non weight bearing Other Position/Activity Restrictions: Per Ortho NWBing, however TDWB is acceptable.    Mobility  Bed Mobility Overal bed mobility: Needs Assistance;+2 for physical assistance Bed Mobility: Supine to Sit;Sit to Supine     Supine to sit: Mod assist Sit to supine: Mod assist;+2 for physical assistance   General bed mobility comments: pt able to come to sitting EOB with only 1 person A for movement of R LE and scooting hips closer to EOB.  pt with heavy reliance on bed rail.  2 person A needed for return to bed.    Transfers Overall transfer level: Needs assistance Equipment used: Rolling walker (2 wheeled) Transfers: Sit to/from Stand Sit to Stand: Mod assist;+2 physical assistance         General transfer comment: pt contineus to be anxious, btu with cueing is able to perform coming to standing x2.  PT placed foot underneath pt's R foot to better assess weightbearing, and  pt is doing well maintaining TDWBing on R LE.  cues for UE use and utilized pad under hips to A with coming to standing.    Ambulation/Gait                 Stairs            Wheelchair Mobility    Modified Rankin (Stroke Patients Only)       Balance Overall balance assessment: Needs assistance Sitting-balance support: Single extremity supported;Bilateral upper extremity supported;No upper extremity supported;Feet supported Sitting balance-Leahy Scale: Fair     Standing balance support: Bilateral upper extremity supported;During functional activity Standing balance-Leahy Scale: Poor                      Cognition Arousal/Alertness: Awake/alert Behavior During Therapy: WFL for tasks assessed/performed Overall Cognitive Status: Within Functional Limits for tasks assessed                      Exercises      General Comments        Pertinent Vitals/Pain Pain Assessment: 0-10 Pain Score: 5  Pain Location: R hip and thigh Pain Descriptors / Indicators: Aching;Grimacing;Guarding Pain Intervention(s): Monitored during session;Premedicated before session;Repositioned    Home Living                      Prior Function            PT Goals (current goals can now be found in the  care plan section) Acute Rehab PT Goals Patient Stated Goal: Home to play with grandson PT Goal Formulation: With patient Time For Goal Achievement: 01/24/16 Potential to Achieve Goals: Good Progress towards PT goals: Progressing toward goals    Frequency    Min 3X/week      PT Plan Current plan remains appropriate    Co-evaluation             End of Session Equipment Utilized During Treatment: Gait belt Activity Tolerance: Patient tolerated treatment well Patient left: in bed;with call bell/phone within reach     Time: [id number] PT Time Calculation (min) (ACUTE ONLY): 28 min  Charges:  $Therapeutic Activity: 23-37 mins                     G CodesCatarina Hartshorn, [place] 01/11/2016, 10:57 AM

## 2016-01-11 NOTE — Progress Notes (Signed)
Subjective: 2 Days Post-Op Procedure(s) (LRB): INTRAMEDULLARY (IM) RETROGRADE FEMORAL NAILING RIGHT DISTAL FEMUR (Right) Patient reports pain as moderate.  Taking by Mouth and voiding okay. Positive flatus. No BM yet. Physical therapy has recommended skilled nursing facility. Patient is touchdown weightbearing on the right.  Objective: Vital signs in last 24 hours: Temp:  [98.7 F (37.1 C)-99.3 F (37.4 C)] 99.3 F (37.4 C) (10/20 0613) Pulse Rate:  [75-111] 75 (10/20 0613) Resp:  [17-19] 17 (10/20 0613) BP: (98-105)/(36-48) 105/48 (10/20 0613) SpO2:  [92 %-93 %] 92 % (10/20 [id number])  Intake/Output from previous day: 10/19 0701 - 10/20 0700 In: 2190 [P.O.:990; I.V.:1000; IV Piggyback:200] Out: 175 [Urine:175] Intake/Output this shift: Total I/O In: 120 [P.O.:120] Out: -    Recent Labs  01/08/16 1608 01/09/16 1900 01/10/16 0459 01/11/16 0719  HGB 12.8 10.2* 9.2* 9.2*    Recent Labs  01/10/16 0459 01/11/16 0719  WBC 11.0* 12.2*  RBC 3.25* 3.16*  HCT 29.2* 28.6*  PLT 165 160    Recent Labs  01/08/16 1608 01/09/16 1900 01/10/16 0459  NA 139  --  139  K 3.5  --  3.9  CL 106  --  105  CO2 25  --  28  BUN 19  --  22*  CREATININE 0.83 1.25* 1.29*  GLUCOSE 121*  --  99  CALCIUM 9.1  --  8.2*   No results for input(s): LABPT, INR in the last 72 hours. Right lower extremity exam: Dressing intact to right knee area. It is clean and dry. There is no redness around the dressing. Her right calf is soft and nontender. She moves her foot actively. Normal sensation and 2+ distal pulses distally.  Assessment/Plan: 2 Days Post-Op Procedure(s) (LRB): INTRAMEDULLARY (IM) RETROGRADE FEMORAL NAILING RIGHT DISTAL FEMUR (Right) Morbid obesity with BMI of 56. Plan: Continue physical therapy. We will allow her to be touchdown weightbearing on the right. Continue subcutaneous Lovenox for DVT prophylaxis. The patient does have a history of a Greenfield filter, but no history of  DVT or pulmonary embolus in the past. 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 Pain meds as needed. Ready for discharge to skilled nursing facility when arrangements can be made. She'll need a follow-up with Dr. Berenice Primas in 2 weeks. Keep dressing clean and dry. Hopefully arrangements can be made in the next day or so. I will go ahead and do a discharge summary and have her ready for discharge.  Artice Bergerson G 01/11/2016, 11:26 AM

## 2016-01-11 NOTE — Clinical Social Work Note (Signed)
CSW provided patient with bed offers. Patient has chosen [facility]. CSW contacted Tammy at [employer] and per [employer] authorization has been submitted at this time.  [address] [place]

## 2016-01-11 NOTE — Discharge Summary (Addendum)
Patient ID: Christina Rubio MRN: [id number] DOB/AGE: 64-Aug-1952 64 y.o.  Admit date: 01/08/2016 Discharge date: 01/15/2016  Admission Diagnoses:  Principal Problem:   Displaced supracondylar fracture of distal end of right femur without intracondylar extension ([place]) Active Problems:   Morbid obesity ([place])   Postoperative anemia due to acute blood loss   Discharge Diagnoses:  Same  Past Medical History:  Diagnosis Date  . Hypertension   . PONV (postoperative nausea and vomiting)   . Spinal stenosis   . Thyroid disease     Surgeries: Procedure(s):Right INTRAMEDULLARY (IM) RETROGRADE FEMORAL NAILING RIGHT DISTAL FEMUR on 01/08/2016 - 01/09/2016   Consultants: Treatment Team:  Dorna Leitz, MD  Discharged Condition: Improved  Hospital Course: Christina Rubio is an 65 y.o. female who was admitted 01/08/2016 for operative treatment ofDisplaced supracondylar fracture of distal end of right femur without intracondylar extension ([place]). Patient has severe unremitting pain that affects sleep, daily activities, and work/hobbies. After pre-op clearance the patient was taken to the operating room on 01/08/2016 - 01/09/2016 and underwent  Procedure(s): INTRAMEDULLARY (IM) RETROGRADE FEMORAL NAILING RIGHT DISTAL FEMUR. Postoperatively the patient was evaluated and treated with by physical therapy. She made slow progress. She lives alone and is in need of skilled nursing facility based on her physical limitations and significant difficulty with ambulation secondary to her obesity and the severe numbness of her right lower extremity injury. Patient was awaiting insurance approval prior to discharge to skilled nursing facility  It did not occur until postoperative day #6.  Efforts for this were started on postoperative day #2.  Patient was given perioperative antibiotics:  Anti-infectives    Start     Dose/Rate Route Frequency Ordered Stop   01/10/16 0600  ceFAZolin (ANCEF) 3 g in dextrose 5 % 50 mL  IVPB  Status:  Discontinued     3 g 130 mL/hr over 30 Minutes Intravenous On call to O.R. 01/09/16 1302 01/09/16 1517   01/09/16 2200  ceFAZolin (ANCEF) IVPB 2g/100 mL premix     2 g 200 mL/hr over 30 Minutes Intravenous Every 8 hours 01/09/16 1845 01/11/16 [id number]       Patient was given sequential compression devices, early ambulation, and chemoprophylaxis to prevent DVT.  Patient benefited maximally from hospital stay and there were no complications.    Recent vital signs:  Patient Vitals for the past 24 hrs:  BP Temp Temp src Pulse Resp SpO2  01/15/16 0638 (!) 129/58 98.5 F (36.9 C) Oral (!) 59 17 98 %  01/14/16 2027 (!) 144/54 99.1 F (37.3 C) Oral (!) 56 17 98 %  01/14/16 1743 130/63 98.4 F (36.9 C) Oral 64 18 95 %     Recent laboratory studies:   Recent Labs  01/13/16 1346  WBC 10.7*  HGB 8.7*  HCT 26.6*  PLT 192     Discharge Medications:     Medication List    STOP taking these medications   naproxen sodium 220 MG tablet Commonly known as:  ANAPROX     TAKE these medications   ALPRAZolam 0.5 MG tablet Commonly known as:  XANAX Take 0.5 mg by mouth at bedtime as needed for anxiety or sleep.   atorvastatin 10 MG tablet Commonly known as:  LIPITOR Take 10 mg by mouth at bedtime.   buPROPion 300 MG 24 hr tablet Commonly known as:  WELLBUTRIN XL Take 300 mg by mouth at bedtime.   enoxaparin 150 MG/ML injection Commonly known as:  LOVENOX Inject 0.51 mLs (75 mg  total) into the skin daily.   ferrous sulfate 325 (65 FE) MG tablet Take 1 tablet (325 mg total) by mouth daily with breakfast.   gabapentin 600 MG tablet Commonly known as:  NEURONTIN Take 600 mg by mouth 3 (three) times daily as needed (nerve pain).   levothyroxine 50 MCG tablet Commonly known as:  SYNTHROID, LEVOTHROID Take 50 mcg by mouth at bedtime.   methocarbamol 750 MG tablet Commonly known as:  ROBAXIN-750 Take 1 tablet (750 mg total) by mouth every 8 (eight) hours as needed  for muscle spasms.   metoprolol succinate 100 MG 24 hr tablet Commonly known as:  TOPROL-XL Take 100 mg by mouth at bedtime.   oxyCODONE 5 MG immediate release tablet Commonly known as:  Oxy IR/ROXICODONE Take 1-2 tablets (5-10 mg total) by mouth every 3 (three) hours as needed for breakthrough pain.   PRESERVISION AREDS 2 Caps Take 1 tablet by mouth 2 (two) times daily.   sertraline 50 MG tablet Commonly known as:  ZOLOFT Take 50 mg by mouth at bedtime.   valsartan-hydrochlorothiazide 160-25 MG tablet Commonly known as:  DIOVAN-HCT Take 1 tablet by mouth at bedtime.       Diagnostic Studies: Dg Chest Portable 1 View  Result Date: 01/08/2016 CLINICAL DATA:  65 year old female right femur fracture. Preoperative study. Initial encounter. EXAM: PORTABLE CHEST 1 VIEW COMPARISON:  None. FINDINGS: Portable AP semi upright view at 1537 hours. Mild to moderate cardiomegaly. Calcified aortic atherosclerosis. Other mediastinal contours are within normal limits. Visualized tracheal air column is within normal limits. Allowing for portable technique the lungs are clear. IMPRESSION: No acute cardiopulmonary abnormality. Cardiomegaly and calcified aortic atherosclerosis. Electronically Signed   By: Genevie Ann M.D.   On: 01/08/2016 15:58   Dg Knee Complete 4 Views Right  Result Date: 01/08/2016 CLINICAL DATA:  Right knee pain after fall today. EXAM: RIGHT KNEE - COMPLETE 4+ VIEW COMPARISON:  None. FINDINGS: Severely displaced fracture is seen involving the distal right femur. Severe narrowing of the medial joint space is noted. Mild narrowing of lateral joint space is noted. No soft tissue abnormality is noted. IMPRESSION: Severely displaced distal right femoral fracture. Electronically Signed   By: Marijo Conception, M.D.   On: 01/08/2016 15:15   Dg C-arm 61-120 Min  Result Date: 01/09/2016 CLINICAL DATA:  Post intraoperative repair of right femur fracture EXAM: DG C-ARM 61-120 MIN; RIGHT FEMUR  PORTABLE 2 VIEW COMPARISON:  01/08/2016 FINDINGS: Four views of the right femur submitted. The patient is status post intraoperative repair of distal right femur fracture. There is near anatomic alignment. Intra medullary rod is noted. Four transverse fixation screws are noted in distal right femur. Fluoroscopy time was 2 minutes 53 seconds. Please see the operative report. IMPRESSION: Status post intraoperative repair of right femoral fracture. Intra medullary rod in place. There is anatomic alignment. Please see the operative report. Electronically Signed   By: Lahoma Crocker M.D.   On: 01/09/2016 17:17   Dg Hip Unilat W Or Wo Pelvis 2-3 Views Right  Result Date: 01/08/2016 CLINICAL DATA:  Right knee pain after fall today. EXAM: DG HIP (WITH OR WITHOUT PELVIS) 2-3V RIGHT COMPARISON:  None. FINDINGS: There is no evidence of hip fracture or dislocation. There is no evidence of arthropathy or other focal bone abnormality. IMPRESSION: Normal right hip. Electronically Signed   By: Marijo Conception, M.D.   On: 01/08/2016 15:17   Dg Femur Port, Min 2 Views Right  Result Date: 01/09/2016  CLINICAL DATA:  Post intraoperative repair of right femur fracture EXAM: DG C-ARM 61-120 MIN; RIGHT FEMUR PORTABLE 2 VIEW COMPARISON:  01/08/2016 FINDINGS: Four views of the right femur submitted. The patient is status post intraoperative repair of distal right femur fracture. There is near anatomic alignment. Intra medullary rod is noted. Four transverse fixation screws are noted in distal right femur. Fluoroscopy time was 2 minutes 53 seconds. Please see the operative report. IMPRESSION: Status post intraoperative repair of right femoral fracture. Intra medullary rod in place. There is anatomic alignment. Please see the operative report. Electronically Signed   By: Lahoma Crocker M.D.   On: 01/09/2016 17:17    Disposition: Final discharge disposition not confirmed  Discharge Instructions    Call MD / Call 911    Complete by:  As  directed    If you experience chest pain or shortness of breath, CALL 911 and be transported to the hospital emergency room.  If you develope a fever above 101 F, pus (white drainage) or increased drainage or redness at the wound, or calf pain, call your surgeon's office.   Constipation Prevention    Complete by:  As directed    Drink plenty of fluids.  Prune juice may be helpful.  You may use a stool softener, such as Colace (over the counter) 100 mg twice a day.  Use MiraLax (over the counter) for constipation as needed.   Diet - low sodium heart healthy    Complete by:  As directed    Increase activity slowly as tolerated    Complete by:  As directed    Touch down weight bearing    Complete by:  As directed    Laterality:  right   Extremity:  Lower      Follow-up Information    GRAVES,JOHN L, MD. Schedule an appointment as soon as possible for a visit in 2 weeks.   Specialty:  Orthopedic Surgery Contact information: [place] [address] [phone]            Signed: Erlene Senters 01/15/2016, 8:47 AM

## 2016-01-12 LAB — CBC
HCT: 26.6 % — ABNORMAL LOW (ref 36.0–46.0)
HEMOGLOBIN: 8.6 g/dL — AB (ref 12.0–15.0)
MCH: 29 pg (ref 26.0–34.0)
MCHC: 32.3 g/dL (ref 30.0–36.0)
MCV: 89.6 fL (ref 78.0–100.0)
PLATELETS: 162 10*3/uL (ref 150–400)
RBC: 2.97 MIL/uL — AB (ref 3.87–5.11)
RDW: 13.9 % (ref 11.5–15.5)
WBC: 11.5 10*3/uL — AB (ref 4.0–10.5)

## 2016-01-12 MED ORDER — ALPRAZOLAM 0.5 MG PO TABS
0.5000 mg | ORAL_TABLET | Freq: Two times a day (BID) | ORAL | Status: DC | PRN
  Administered 2016-01-12 – 2016-01-15 (×3): 0.5 mg via ORAL
  Filled 2016-01-12 (×3): qty 1

## 2016-01-12 NOTE — Progress Notes (Addendum)
Subjective: 3 Days Post-Op Procedure(s) (LRB): INTRAMEDULLARY (IM) RETROGRADE FEMORAL NAILING RIGHT DISTAL FEMUR (Right) Patient reports pain as moderate.  Taking by Mouth and voiding okay. Positive flatus. No BM yet. Physical therapy has recommended skilled nursing facility. Patient is touchdown weightbearing on the right. Reports experiencing some anxiety this am.  Objective: Vital signs in last 24 hours: Temp:  [98.8 F (37.1 C)-99.6 F (37.6 C)] 98.9 F (37.2 C) (10/21 0554) Pulse Rate:  [67-69] 68 (10/21 0554) Resp:  [16-18] 16 (10/21 0554) BP: (128-137)/(48-85) 137/85 (10/21 0554) SpO2:  [93 %-98 %] 98 % (10/21 0554)  Intake/Output from previous day: 10/20 0701 - 10/21 0700 In: 720 [P.O.:720] Out: -  Intake/Output this shift: No intake/output data recorded.   Recent Labs  01/09/16 1900 01/10/16 0459 01/11/16 0719 01/12/16 0243  HGB 10.2* 9.2* 9.2* 8.6*    Recent Labs  01/11/16 0719 01/12/16 0243  WBC 12.2* 11.5*  RBC 3.16* 2.97*  HCT 28.6* 26.6*  PLT 160 162    Recent Labs  01/09/16 1900 01/10/16 0459  NA  --  139  K  --  3.9  CL  --  105  CO2  --  28  BUN  --  22*  CREATININE 1.25* 1.29*  GLUCOSE  --  99  CALCIUM  --  8.2*   No results for input(s): LABPT, INR in the last 72 hours. Right lower extremity exam: Dressing intact to right knee area. It is clean and dry. There is no redness around the dressing. Her right calf is soft and nontender. She moves her foot actively. Normal sensation and 2+ distal pulses distally.  Assessment/Plan: 3 Days Post-Op Procedure(s) (LRB): INTRAMEDULLARY (IM) RETROGRADE FEMORAL NAILING RIGHT DISTAL FEMUR (Right) Morbid obesity with BMI of 56. Plan: Continue physical therapy. We will allow her to be touchdown weightbearing on the right. Continue subcutaneous Lovenox for DVT prophylaxis. The patient does have a history of a Greenfield filter, but no history of DVT or pulmonary embolus in the past. She receives  this filter when she had gastric sleeve surgery. Pain meds as needed.  Xanax dosing adjusted to allow daytime use. Remains medically ready for discharge to skilled nursing facility when arrangements can be made. She'll need a follow-up with Dr. Berenice Primas in 2 weeks. Keep dressing clean and dry. Discharge summary tentatively prepared to expedite d/c if appropriate placement materializes.  Rockne Dearinger A. 01/12/2016, 10:47 AM

## 2016-01-13 LAB — CBC
HCT: 26.6 % — ABNORMAL LOW (ref 36.0–46.0)
Hemoglobin: 8.7 g/dL — ABNORMAL LOW (ref 12.0–15.0)
MCH: 29.3 pg (ref 26.0–34.0)
MCHC: 32.7 g/dL (ref 30.0–36.0)
MCV: 89.6 fL (ref 78.0–100.0)
PLATELETS: 192 10*3/uL (ref 150–400)
RBC: 2.97 MIL/uL — ABNORMAL LOW (ref 3.87–5.11)
RDW: 13.8 % (ref 11.5–15.5)
WBC: 10.7 10*3/uL — ABNORMAL HIGH (ref 4.0–10.5)

## 2016-01-13 NOTE — Progress Notes (Signed)
Subjective: 4 Days Post-Op Procedure(s) (LRB): INTRAMEDULLARY (IM) RETROGRADE FEMORAL NAILING RIGHT DISTAL FEMUR (Right) Patient reports leg not hurting.  Feels a little quesy.  No PT y'day.  Sat on side of bed. Physical therapy has recommended skilled nursing facility. Patient is touchdown weightbearing on the right. + BM yday  Objective: Vital signs in last 24 hours: Temp:  [98.8 F (37.1 C)-99.3 F (37.4 C)] 99.3 F (37.4 C) (10/22 0615) Pulse Rate:  [51-71] 66 (10/22 0615) Resp:  [16-17] 17 (10/22 0615) BP: (130-157)/(49-73) 151/73 (10/22 0615) SpO2:  [93 %-96 %] 96 % (10/22 0615)  Intake/Output from previous day: 10/21 0701 - 10/22 0700 In: 620 [P.O.:620] Out: 1150 [Urine:1150] Intake/Output this shift: No intake/output data recorded.   Recent Labs  01/11/16 0719 01/12/16 0243  HGB 9.2* 8.6*    Recent Labs  01/11/16 0719 01/12/16 0243  WBC 12.2* 11.5*  RBC 3.16* 2.97*  HCT 28.6* 26.6*  PLT 160 162   No results for input(s): NA, K, CL, CO2, BUN, CREATININE, GLUCOSE, CALCIUM in the last 72 hours. No results for input(s): LABPT, INR in the last 72 hours. Right lower extremity exam: Incisions clean/dry/no redness or drainage.  right calf is soft and nontender. She moves her foot actively. Normal sensation and 2+ distal pulses distally.  Assessment/Plan: 4 Days Post-Op Procedure(s) (LRB): INTRAMEDULLARY (IM) RETROGRADE FEMORAL NAILING RIGHT DISTAL FEMUR (Right) Morbid obesity with BMI of 56. Plan: Continue physical therapy. We will allow her to be touchdown weightbearing on the right. Continue subcutaneous Lovenox for DVT prophylaxis. The patient does have a history of a Greenfield filter, but no history of DVT or pulmonary embolus in the past. 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 Pain meds as needed.  Xanax dosing adjusted to allow daytime use. Remains medically ready for discharge to skilled nursing facility when arrangements can be  made. She'll need a follow-up with Dr. Berenice Primas in 2 weeks. Keep dressing clean and dry. Discharge summary tentatively prepared to expedite d/c if appropriate placement materializes. Will check CBC today--last Hgb 8.6 y'day 0243 hours, today feels "quesy"  Christina Rubio, [name] 01/13/2016, 11:24 AM

## 2016-01-14 DIAGNOSIS — D62 Acute posthemorrhagic anemia: Secondary | ICD-10-CM

## 2016-01-14 NOTE — Progress Notes (Signed)
SW has sent the PT note [facility] has requested for insurance through the hub. SW made admissions aware.   SW spoke with pt at bedside and informed her that the facility is still aware that she will come. However, they are waiting for insurance authorization. Patient expressed understanding and states that she has no questions for SW at this time.  Tilda Burrow, MSW [phone]

## 2016-01-14 NOTE — Progress Notes (Signed)
SW reached out to admissions/Tammy to inquire if she has received insurance authorization number. However, there was no answer. SW will continue to follow up.   Tilda Burrow, MSW [phone]

## 2016-01-14 NOTE — Progress Notes (Signed)
Subjective: 5 Days Post-Op Procedure(s) (LRB): INTRAMEDULLARY (IM) RETROGRADE FEMORAL NAILING RIGHT DISTAL FEMUR (Right) Patient reports pain as moderate.  Taking oral pain meds. Taking by mouth and voiding okay. Over the weekend she reports she did not have physical therapy but did sit up on the side of the bed.Denies dizziness, chest pain, or shortness of breath Objective: Vital signs in last 24 hours: Temp:  [98.6 F (37 C)-99.6 F (37.6 C)] 99.6 F (37.6 C) (10/23 0656) Pulse Rate:  [62-64] 64 (10/23 0656) Resp:  [17-18] 18 (10/23 0656) BP: (137-145)/(57-67) 142/65 (10/23 0656) SpO2:  [95 %-99 %] 95 % (10/23 0656)  Intake/Output from previous day: 10/22 0701 - 10/23 0700 In: 684 [P.O.:684] Out: -  Intake/Output this shift: No intake/output data recorded.   Recent Labs  01/12/16 0243 01/13/16 1346  HGB 8.6* 8.7*    Recent Labs  01/12/16 0243 01/13/16 1346  WBC 11.5* 10.7*  RBC 2.97* 2.97*  HCT 26.6* 26.6*  PLT 162 192   No results for input(s): NA, K, CL, CO2, BUN, CREATININE, GLUCOSE, CALCIUM in the last 72 hours. No results for input(s): LABPT, INR in the last 72 hours. Right lower extremity exam: Neurovascular intact Sensation intact distally Intact pulses distally Dorsiflexion/Plantar flexion intact Incision: dressing C/D/I No cellulitis present Compartment soft  Assessment/Plan: 5 Days Post-Op Procedure(s) (LRB): INTRAMEDULLARY (IM) RETROGRADE FEMORAL NAILING RIGHT DISTAL FEMUR (Right)Acute blood loss anemia, expected. Plan: .  Oral iron325 mg twice daily. Up with therapy Discharge to SNFWhen arrangements can be made. Touchdown weightbearing on right. Lovenox , subcutaneou daily 2 weeks. Follow-up with Dr. Berenice Primas in 2 weeks. Sheretta Grumbine G 01/14/2016, 9:17 AM

## 2016-01-14 NOTE — Progress Notes (Signed)
SW was notified by Nurse CM that the pt will be discharging today. Per note, the pt has chosen [facility]. SW reached out to facility admissions/Liason Tammy. She states that she is aware of the patient and the pt is welcomed to come today. However, she states that the facility/she will need to apply for the patient to get authorization from insurance. Tammy of [facility] states that the pt will need an updated PT note from today. SW made Nurse CM aware.  SW made PT/Karen aware who states that she will see the pt shortly. SW made Admissions/Tammy aware, who states that she does not need anything from SW at this time. SW will continue to follow up.   Tilda Burrow, MSW [phone] 01/14/2016 10:31 AM

## 2016-01-14 NOTE — Progress Notes (Addendum)
[facility] Admissions/ Tammy reached out to SW and states that she does not have an authorization number yet. She states that she is waiting on her business office manager to return back to facility.  SW asked admissions/Tammy would pt possibly be able to come on a LOG if she does not receive an authorization. However, she states that he pt will not be able to.  Tilda Burrow, MSW [phone]

## 2016-01-14 NOTE — Progress Notes (Signed)
Physical Therapy Treatment Patient Details Name: Christina Rubio MRN: [id number] DOB: 1950/03/30 Today's Date: 01/14/2016    History of Present Illness Pt is 65 y/o female presenting after fall in home where she fractured her right femur. S/p INTRAMEDULLARY (IM) RETROGRADE FEMORAL NAILING RIGHT DISTAL FEMUR (Right).Pt has a past medical history of morbid obesity, Hypertension;Spinal stenosis; and Thyroid disease.    PT Comments    Pt with improvement with bed mobility and with good adherence to TDWB status. Pt anxious at times.  Con't to recommend SNF.  Follow Up Recommendations  SNF     Equipment Recommendations  None recommended by PT    Recommendations for Other Services       Precautions / Restrictions Restrictions Weight Bearing Restrictions: Yes RLE Weight Bearing: Non weight bearing Other Position/Activity Restrictions: Per Ortho [facility], however TDWB is acceptable.    Mobility  Bed Mobility Overal bed mobility: Needs Assistance Bed Mobility: Supine to Sit     Supine to sit: Min assist Sit to supine: Mod assist;+2 for physical assistance   General bed mobility comments: MIN A for supine > sit with A at end of transfer only.  MOD A of 2 to return supine.  Transfers Overall transfer level: Needs assistance Equipment used: Rolling walker (2 wheeled) Transfers: Sit to/from Stand Sit to Stand: Mod assist;+2 physical assistance         General transfer comment: Pt did well with first stand, but needed 2 attempts for 2nd stand.  Did well with TDWB on R LE with cueing.   Ambulation/Gait                 Stairs            Wheelchair Mobility    Modified Rankin (Stroke Patients Only)       Balance Overall balance assessment: Needs assistance Sitting-balance support: Feet supported Sitting balance-Leahy Scale: Fair     Standing balance support: Bilateral upper extremity supported Standing balance-Leahy Scale: Poor                       Cognition Arousal/Alertness: Awake/alert Behavior During Therapy: WFL for tasks assessed/performed Overall Cognitive Status: Within Functional Limits for tasks assessed                      Exercises      General Comments        Pertinent Vitals/Pain Pain Assessment: No/denies pain    Home Living                      Prior Function            PT Goals (current goals can now be found in the care plan section) Acute Rehab PT Goals Patient Stated Goal: Home to play with grandson PT Goal Formulation: With patient Time For Goal Achievement: 01/24/16 Potential to Achieve Goals: Good Progress towards PT goals: Progressing toward goals    Frequency    Min 3X/week      PT Plan Current plan remains appropriate    Co-evaluation             End of Session Equipment Utilized During Treatment: Gait belt Activity Tolerance: Patient tolerated treatment well Patient left: in bed;with call bell/phone within reach     Time: 1039-1059 PT Time Calculation (min) (ACUTE ONLY): 20 min  Charges:  $Therapeutic Activity: 8-22 mins  G Codes:      Christina Rubio 01/14/2016, 11:39 AM

## 2016-01-14 NOTE — Progress Notes (Addendum)
SW reached out to Admissions/Tammy who states that [employer] has yet to provide an authorization number. This type of insurance, the facility obtains the authorization number. She states that the the representative managing the patient's case is "Suzan Garibaldi".   SW attempted to assist the facility with obtaining an authorization by reaching out to [employer], and informed Tammy. SW left a message with Marzetta Board to inquire about authorization number on facilities behalf. However, she did not answer the phone. SW left a message asking for a call back.   The pt cannot go to the facility until she has an authorization number. SW informed patient and staff of situation. SW made patient aware. SW made nurse aware. SW made Nurse CM aware. They have no questions at this time.   Tilda Burrow, MSW [phone]

## 2016-01-15 NOTE — Progress Notes (Signed)
SW spoke with facility/Admissions; Tammy who states that she now has authorization for the patient. She states that the pt is welcomed to come this morning and that she will only need a discharge summary.  SW reached out to PA and made him aware. He states he will update summary. Once the summary is updated,SW will send the requested document over the hub for admissions.  SW will arrange transportation through [facility] once pt is clear.   SW made Nurse CM aware. SW will make patient aware.   Tilda Burrow, MSW [phone] 01/15/2016 8:36 AM

## 2016-01-15 NOTE — Progress Notes (Signed)
Patient discharged to [facility], reported to nurse Ivin Booty, transported via [facility].

## 2016-01-15 NOTE — Progress Notes (Signed)
Occupational Therapy Treatment Patient Details Name: Christina Rubio MRN: [id number] DOB: 29-Sep-1950 Today's Date: 01/15/2016    History of present illness Pt is 65 y/o female presenting after fall in home where she fractured her right femur. S/p INTRAMEDULLARY (IM) RETROGRADE FEMORAL NAILING RIGHT DISTAL FEMUR (Right).Pt has a past medical history of morbid obesity, Hypertension;Spinal stenosis; and Thyroid disease.   OT comments  Pt making progress towards OT goals. Today's session focused on bathing and dressing in preparation for transfer to SNF for further rehab. Pt willing and able to participate, and increasing in self-care independence. Pt continues to benefit from skilled OT to maximize independence in ADL.  Follow Up Recommendations  SNF    Equipment Recommendations  Other (comment) (TBD by next venue of care)    Recommendations for Other Services      Precautions / Restrictions Precautions Precautions: Fall Restrictions Weight Bearing Restrictions: Yes RLE Weight Bearing: Touchdown weight bearing Other Position/Activity Restrictions: Per Ortho NWBing, however TDWB is acceptable.       Mobility Bed Mobility Overal bed mobility: Needs Assistance Bed Mobility: Rolling Rolling: Min guard         General bed mobility comments: heavy use of bed rail for assist rolling, HOB elevated  Transfers                 General transfer comment: No transfers attempted this session    Balance                                   ADL Overall ADL's : Needs assistance/impaired         Upper Body Bathing: Set up;Bed level Upper Body Bathing Details (indicate cue type and reason): Pt able to sponge bathe herself bed level Lower Body Bathing: Maximal assistance;Bed level   Upper Body Dressing : Moderate assistance;Minimal assistance;Bed level Upper Body Dressing Details (indicate cue type and reason): Pt mod assit to attach bra (Pt assist by rolling in  bed), min assist for dress                   General ADL Comments: Pt with good bed mobility during session to assist with bathing and dressing at bed level      Vision                     Perception     Praxis      Cognition   Behavior During Therapy: [facility] for tasks assessed/performed Overall Cognitive Status: Within Functional Limits for tasks assessed                       Extremity/Trunk Assessment               Exercises     Shoulder Instructions       General Comments      Pertinent Vitals/ Pain       Pain Assessment: 0-10 Pain Score: 3  Pain Location: R hip and thigh Pain Descriptors / Indicators: Sore Pain Intervention(s): Limited activity within patient's tolerance;Monitored during session  Home Living                                          Prior Functioning/Environment  Frequency  Min 2X/week        Progress Toward Goals  OT Goals(current goals can now be found in the care plan section)  Progress towards OT goals: Progressing toward goals  Acute Rehab OT Goals Patient Stated Goal: Home to play with grandson OT Goal Formulation: With patient Time For Goal Achievement: 01/17/16 Potential to Achieve Goals: Good  Plan Discharge plan remains appropriate    Co-evaluation                 End of Session     Activity Tolerance Patient tolerated treatment well   Patient Left in bed;with call bell/phone within reach;with nursing/sitter in room   Nurse Communication Weight bearing status        Time: [id number] OT Time Calculation (min): 35 min  Charges: OT General Charges $OT Visit: 1 Procedure OT Treatments $Self Care/Home Management : 23-37 mins  Merri Ray Sherrilyn Nairn 01/15/2016, 11:17 AM Hulda Humphrey OTR/L [phone]

## 2016-01-15 NOTE — Progress Notes (Signed)
Subjective: 6 Days Post-Op Procedure(s) (LRB): INTRAMEDULLARY (IM) RETROGRADE FEMORAL NAILING RIGHT DISTAL FEMUR (Right) Patient reports pain as moderate. Slow progress with physical therapy. Really unable to ambulate independently.  Awaiting insurance approval for skilled nursing facility.  Positive bowel movement. Take by mouth and voiding okay.   Objective: Vital signs in last 24 hours: Temp:  [98.4 F (36.9 C)-99.1 F (37.3 C)] 98.5 F (36.9 C) (10/24 [id number]) Pulse Rate:  [56-64] 59 (10/24 0638) Resp:  [17-18] 17 (10/24 [id number]) BP: (129-144)/(54-63) 129/58 (10/24 [id number]) SpO2:  [95 %-98 %] 98 % (10/24 [id number])  Intake/Output from previous day: 10/23 0701 - 10/24 0700 In: 220 [P.O.:220] Out: -  Intake/Output this shift: No intake/output data recorded.   Recent Labs  01/13/16 1346  HGB 8.7*    Recent Labs  01/13/16 1346  WBC 10.7*  RBC 2.97*  HCT 26.6*  PLT 192   No results for input(s): NA, K, CL, CO2, BUN, CREATININE, GLUCOSE, CALCIUM in the last 72 hours. No results for input(s): LABPT, INR in the last 72 hours. Right lower extremity exam: . Wound benign.  NV intact distally.  Calf soft.  Nontender.  Moves foot actively.  Distal pulses 2+.   Assessment/Plan: 6 Days Post-Op Procedure(s) (LRB): INTRAMEDULLARY (IM) RETROGRADE FEMORAL NAILING RIGHT DISTAL FEMUR (Right)Acute blood loss anemia.  Stable. Plan: Up with therapy Discharge to SNF  Touchdown weightbearing only on the right lower extremity.Keep dressing clean and dry. Keep wound dry. Follow-up with Dr. Berenice Primas in 2 weeks. Lovenox subcutaneous for DVT prophylaxis 2 weeks.  Christina Rubio 01/15/2016, 8:44 AM

## 2016-01-15 NOTE — Progress Notes (Signed)
SW spoke with facility/Tammy of [facility] and informed her that the summary has been sent. She states that she would like the patient to come at 11:00am  SW called pt and made arrangements for them to pick up pt at 11:00am.  SW made nurse CM aware. SW made pt aware. SW mand Nurse CM aware.  Tilda Burrow, MSW [phone] 01/15/2016 9:04 AM

## 2016-01-16 ENCOUNTER — Non-Acute Institutional Stay (SKILLED_NURSING_FACILITY): Payer: BLUE CROSS/BLUE SHIELD | Admitting: Adult Health

## 2016-01-16 ENCOUNTER — Encounter: Payer: Self-pay | Admitting: Adult Health

## 2016-01-16 ENCOUNTER — Other Ambulatory Visit: Payer: Self-pay | Admitting: *Deleted

## 2016-01-16 DIAGNOSIS — E034 Atrophy of thyroid (acquired): Secondary | ICD-10-CM

## 2016-01-16 DIAGNOSIS — F418 Other specified anxiety disorders: Secondary | ICD-10-CM

## 2016-01-16 DIAGNOSIS — E785 Hyperlipidemia, unspecified: Secondary | ICD-10-CM | POA: Diagnosis not present

## 2016-01-16 DIAGNOSIS — I10 Essential (primary) hypertension: Secondary | ICD-10-CM

## 2016-01-16 DIAGNOSIS — D62 Acute posthemorrhagic anemia: Secondary | ICD-10-CM

## 2016-01-16 DIAGNOSIS — S72451D Displaced supracondylar fracture without intracondylar extension of lower end of right femur, subsequent encounter for closed fracture with routine healing: Secondary | ICD-10-CM

## 2016-01-16 MED ORDER — ALPRAZOLAM 0.5 MG PO TABS: 0.5000 mg | ORAL_TABLET | Freq: Every evening | ORAL | 0 refills | Status: DC | PRN

## 2016-01-16 NOTE — Progress Notes (Signed)
Patient ID: Christina Rubio, female   DOB: 1950-12-07, 65 y.o.   MRN: [id number]    Location:   [facility] Room Number: 127-A Place of Service:  SNF (31)   CODE STATUS: Full Code  Allergies  Allergen Reactions  . Epinephrine Palpitations    faint  . Penicillins Rash    Chief Complaint  Patient presents with  . Hospitalization Follow-up    Hospital Follow up    HPI:  She has been hospitalized for a displaced distal end of right femur. She is here for short term rehab. She is not complaining of pain at this time. She states that she is getting adequate pain relief. There are no nursing concerns at this time.   Past Medical History:  Diagnosis Date  . Hypertension   . PONV (postoperative nausea and vomiting)   . Spinal stenosis   . Thyroid disease     Past Surgical History:  Procedure Laterality Date  . CESAREAN SECTION    . CHOLECYSTECTOMY    . COLON SURGERY    . FEMUR IM NAIL Right 01/09/2016   Procedure: INTRAMEDULLARY (IM) RETROGRADE FEMORAL NAILING RIGHT DISTAL FEMUR;  Surgeon: Dorna Leitz, MD;  Location: [facility];  Service: Orthopedics;  Laterality: Right;  . LAPAROSCOPIC GASTRIC SLEEVE RESECTION    . Right thyroidectomy    . TONSILLECTOMY      Social History   Social History  . Marital status: Unknown    Spouse name: N/A  . Number of children: N/A  . Years of education: N/A   Occupational History  . Not on file.   Social History Main Topics  . Smoking status: Never Smoker  . Smokeless tobacco: Never Used  . Alcohol use No  . Drug use: No  . Sexual activity: No   Other Topics Concern  . Not on file   Social History Narrative  . No narrative on file   No family history on file.    VITAL SIGNS BP (!) 146/56   Pulse 76   Temp 97.6 F (36.4 C) (Oral)   Resp 20   Ht 5\' 5"  (1.651 m)   Wt (!) 340 lb (154.2 kg)   SpO2 98%   BMI 56.58 kg/m   Patient's Medications  New Prescriptions   No medications on file  Previous Medications    ALPRAZOLAM (XANAX) 0.5 MG TABLET    Take 0.5 mg by mouth at bedtime as needed for anxiety or sleep.    ATORVASTATIN (LIPITOR) 10 MG TABLET    Take 10 mg by mouth at bedtime.    BUPROPION (WELLBUTRIN XL) 300 MG 24 HR TABLET    Take 300 mg by mouth at bedtime.    ENOXAPARIN (LOVENOX) 150 MG/ML INJECTION    Inject 0.51 mLs (75 mg total) into the skin daily.   FERROUS SULFATE 325 (65 FE) MG TABLET    Take 1 tablet (325 mg total) by mouth daily with breakfast.   GABAPENTIN (NEURONTIN) 600 MG TABLET    Take 600 mg by mouth 3 (three) times daily as needed (nerve pain).    LEVOTHYROXINE (SYNTHROID, LEVOTHROID) 50 MCG TABLET    Take 50 mcg by mouth at bedtime.    METHOCARBAMOL (ROBAXIN-750) 750 MG TABLET    Take 1 tablet (750 mg total) by mouth every 8 (eight) hours as needed for muscle spasms.   METOPROLOL SUCCINATE (TOPROL-XL) 100 MG 24 HR TABLET    Take 100 mg by mouth at bedtime.  MULTIPLE VITAMINS-MINERALS (PRESERVISION AREDS 2) CAPS    Take 1 tablet by mouth 2 (two) times daily.   OXYCODONE (OXY IR/ROXICODONE) 5 MG IMMEDIATE RELEASE TABLET    Take 1-2 tablets (5-10 mg total) by mouth every 3 (three) hours as needed for breakthrough pain.   SERTRALINE (ZOLOFT) 50 MG TABLET    Take 50 mg by mouth at bedtime.    VALSARTAN-HYDROCHLOROTHIAZIDE (DIOVAN-HCT) 160-25 MG TABLET    Take 1 tablet by mouth at bedtime.   Modified Medications   No medications on file  Discontinued Medications   No medications on file     SIGNIFICANT DIAGNOSTIC EXAMS  01-08-16: right knee x-ray: Severely displaced distal right femoral fracture.  01-08-16: right hip and pelvic x-ray: There is no evidence of hip fracture or dislocation. There is no evidence of arthropathy or other focal bone abnormality.  01-08-16: chest x-ray: No acute cardiopulmonary abnormality. Cardiomegaly and calcified aortic atherosclerosis.   LABS REVIEWED:   01-08-16; wbc 14.6 ;hgb 12.8; hct 38.2 ;mcv 89.7; plt 191; glucose 121; bun 19; creat  0.83; k+ 3.5; na++ 139 01-10-16: wbc 11.0; hgb 9.2; hct 29.2; mcv 89.8; plt 165; glucose 99; bun 22; creat 1.29; k+ 3.9; na++ 139 01-12-16: wbc 11.5; hgb 8.6; hct 26.6; mcv 89.6; plt 162    Review of Systems  Constitutional: Negative for malaise/fatigue.  Respiratory: Negative for cough and shortness of breath.   Cardiovascular: Negative for chest pain, palpitations and leg swelling.  Gastrointestinal: Negative for abdominal pain, constipation and heartburn.  Musculoskeletal: Negative for back pain, joint pain and myalgias.       Has ace wrap on left lower leg   Skin: Negative.   Neurological: Negative for dizziness.  Psychiatric/Behavioral: The patient is not nervous/anxious.     Physical Exam  Constitutional: She is oriented to person, place, and time. No distress.  Obese   Eyes: Conjunctivae are normal.  Neck: Neck supple. No JVD present. No thyromegaly present.  Cardiovascular: Normal rate, regular rhythm and intact distal pulses.   Respiratory: Effort normal and breath sounds normal. No respiratory distress. She has no wheezes.  GI: Soft. Bowel sounds are normal. She exhibits no distension. There is no tenderness.  Musculoskeletal: She exhibits no edema.  Able to move all extremities  Is status post right femur fracture Ace wrap in place on right lower leg   Lymphadenopathy:    She has no cervical adenopathy.  Neurological: She is alert and oriented to person, place, and time.  Skin: Skin is warm and dry. She is not diaphoretic.  Incision line without signs of infection present   Psychiatric: She has a normal mood and affect.      ASSESSMENT/ PLAN:  1. Hypertension: will continue toprol xl 100 gm daily and diovan hct 160/25 mg daily   2. Dyslipidemia: will continue lipitor 10 mg daily  3. Anemia: hgb 8.6; will continue iron daily  4. Hypothyroidism: will continue synthroid 50 mcg daily  5. Depression with anxiety: will continue wellbutrin xl 300 mg daily zoloft  50 mg daily and has xanax 0.5 mg nightly as needed   6.  Displace supracondylar fracture of distal end of right femur: will follow up with orthopedics as indicated; will continue therapy as directed; will continue robaxin 750 mg every 8 hours as needed; nuerontin 600 mg three times daily and oxycodone 5 or 10 mg every 3 hours as needed. Will complete lovenox therapy.    Time spent with patient 50  minutes >50% time  spent counseling; reviewing medical record; tests; labs; and developing future plan of care   MD is aware of resident's narcotic use and is in agreement with current plan of care. We will attempt to wean resident as apropriate   Ok Edwards NP [facility]  Contact [phone] Monday through Friday 8am- 5pm  After hours call [phone]

## 2016-01-16 NOTE — Telephone Encounter (Signed)
[facility] #[phone] Fax:[phone]  

## 2016-01-21 ENCOUNTER — Encounter: Payer: Self-pay | Admitting: Internal Medicine

## 2016-01-21 ENCOUNTER — Non-Acute Institutional Stay (SKILLED_NURSING_FACILITY): Payer: BLUE CROSS/BLUE SHIELD | Admitting: Internal Medicine

## 2016-01-21 DIAGNOSIS — G894 Chronic pain syndrome: Secondary | ICD-10-CM

## 2016-01-21 DIAGNOSIS — I10 Essential (primary) hypertension: Secondary | ICD-10-CM

## 2016-01-21 DIAGNOSIS — E785 Hyperlipidemia, unspecified: Secondary | ICD-10-CM

## 2016-01-21 DIAGNOSIS — F339 Major depressive disorder, recurrent, unspecified: Secondary | ICD-10-CM | POA: Diagnosis not present

## 2016-01-21 DIAGNOSIS — R5381 Other malaise: Secondary | ICD-10-CM

## 2016-01-21 DIAGNOSIS — S72451D Displaced supracondylar fracture without intracondylar extension of lower end of right femur, subsequent encounter for closed fracture with routine healing: Secondary | ICD-10-CM | POA: Diagnosis not present

## 2016-01-21 DIAGNOSIS — D62 Acute posthemorrhagic anemia: Secondary | ICD-10-CM | POA: Diagnosis not present

## 2016-01-21 NOTE — Progress Notes (Signed)
Patient ID: Christina Rubio, female   DOB: 30-Nov-1950, 65 y.o.   MRN: [id number]    HISTORY AND PHYSICAL   DATE: 01/21/2016  Location:    [facility] Room Number: 127- A Place of Service: SNF (31)   Extended Emergency Contact Information Primary Emergency Contact: Daulton,Angela  [place] Phone: [phone] Relation: Daughter  Advanced Directive information Does patient have an advance directive?: No, Would patient like information on creating an advanced directive?: No - patient declined information  Chief Complaint  Patient presents with  . New Admit To SNF    HPI:  65 yo female seen today as a new admission into SNF following hospital stay for fall on 10/17th resulting in distal right femur displaced fx, postop anemia due to acute blood loss, hypothyroidism. She was taken to the OR by Ortho and underwent IM nail on 10/17th and 10/18th. Hgb 10.2-->8.7; WBC 14.6K-->10.7K with abs Neut 12.2K; Cr 0.83--> 1.29 at d/c. She presents to SNF for short term rehab.  She reports no concerns today. She is due to see Ortho soon for f/u right femur fx s/p IM nail. She would like diet changed to regular as her BP has been well controlled over the years. No nursing issues. No falls. She reports c/a falling but is tolerating tx. She is on lovemox for DVT prophylaxis. Pain controlled on roxicodone  HTN - BP stable on metoprolol succ and diovan HCT  Hypothyroidism - stable on levothyroxine  MDD/anxiety - stable on xanax, wellbutrin XL and sertraline Anemia - Hgb 8.7 at d/c. She takes ferrous sulfate  Chronic pain syndrome/spinal stenosis - she is stable on neurontin and prn roxicodone and robaxin  Hyperlipidemia - takes lipitor. No myalgias    Past Medical History:  Diagnosis Date  . Hypertension   . PONV (postoperative nausea and vomiting)   . Spinal stenosis   . Thyroid disease     Past Surgical History:  Procedure Laterality Date  . CESAREAN  SECTION    . CHOLECYSTECTOMY    . COLON SURGERY    . FEMUR IM NAIL Right 01/09/2016   Procedure: INTRAMEDULLARY (IM) RETROGRADE FEMORAL NAILING RIGHT DISTAL FEMUR;  Surgeon: Dorna Leitz, MD;  Location: [facility];  Service: Orthopedics;  Laterality: Right;  . LAPAROSCOPIC GASTRIC SLEEVE RESECTION    . Right thyroidectomy    . TONSILLECTOMY      Patient Care Team: Leeroy Cha, MD as PCP - General (Internal Medicine) Dorna Leitz, MD as Consulting Physician (Orthopedic Surgery)  Social History   Social History  . Marital status: Unknown    Spouse name: N/A  . Number of children: N/A  . Years of education: N/A   Occupational History  . Not on file.   Social History Main Topics  . Smoking status: Never Smoker  . Smokeless tobacco: Never Used  . Alcohol use No  . Drug use: No  . Sexual activity: No   Other Topics Concern  . Not on file   Social History Narrative  . No narrative on file     reports that she has never smoked. She has never used smokeless tobacco. She reports that she does not drink alcohol or use drugs.  History reviewed. No pertinent family history. No family status information on file.     There is no immunization history on file for this patient.  Allergies  Allergen Reactions  . Epinephrine Palpitations    faint  . Penicillins Rash    Has patient  had a PCN reaction causing immediate rash, facial/tongue/throat swelling, SOB or lightheadedness with hypotension:  Yes Has patient had a PCN reaction causing severe rash involving mucus membranes or skin necrosis: No Has patient had a PCN reaction that required hospitalization: No Has patient had a PCN reaction occurring within the last 10 years: No If all of the above answers are "NO", then may proceed with Cephalosporin use.;    Medications: Patient's Medications  New Prescriptions   No medications on file  Previous Medications   ALPRAZOLAM (XANAX) 0.5 MG TABLET    Take 1 tablet (0.5 mg  total) by mouth at bedtime as needed for anxiety or sleep.   ATORVASTATIN (LIPITOR) 10 MG TABLET    Take 10 mg by mouth at bedtime.    BUPROPION (WELLBUTRIN XL) 300 MG 24 HR TABLET    Take 300 mg by mouth at bedtime.    ENOXAPARIN (LOVENOX) 150 MG/ML INJECTION    Inject 0.51 mLs (75 mg total) into the skin daily.   FERROUS SULFATE 325 (65 FE) MG TABLET    Take 1 tablet (325 mg total) by mouth daily with breakfast.   GABAPENTIN (NEURONTIN) 600 MG TABLET    Take 600 mg by mouth 3 (three) times daily as needed (nerve pain).    LEVOTHYROXINE (SYNTHROID, LEVOTHROID) 50 MCG TABLET    Take 50 mcg by mouth at bedtime.    METHOCARBAMOL (ROBAXIN-750) 750 MG TABLET    Take 1 tablet (750 mg total) by mouth every 8 (eight) hours as needed for muscle spasms.   METOPROLOL SUCCINATE (TOPROL-XL) 100 MG 24 HR TABLET    Take 100 mg by mouth at bedtime.    MULTIPLE VITAMINS-MINERALS (PRESERVISION AREDS 2) CAPS    Take 1 tablet by mouth 2 (two) times daily.   OXYCODONE (OXY IR/ROXICODONE) 5 MG IMMEDIATE RELEASE TABLET    Take 1-2 tablets (5-10 mg total) by mouth every 3 (three) hours as needed for breakthrough pain.   SERTRALINE (ZOLOFT) 50 MG TABLET    Take 50 mg by mouth at bedtime.    VALSARTAN-HYDROCHLOROTHIAZIDE (DIOVAN-HCT) 160-25 MG TABLET    Take 1 tablet by mouth at bedtime.   Modified Medications   No medications on file  Discontinued Medications   No medications on file    Review of Systems  Cardiovascular: Positive for leg swelling.  Musculoskeletal: Positive for gait problem.  Skin: Negative for rash.  All other systems reviewed and are negative.   Vitals:   01/21/16 1022  BP: 115/64  Pulse: 69  Resp: 20  Temp: 97.1 F (36.2 C)  TempSrc: Oral  SpO2: 98%  Weight: (!) 340 lb (154.2 kg)  Height: 5\' 5"  (1.651 m)   Body mass index is 56.58 kg/m.  Physical Exam  Constitutional: She is oriented to person, place, and time. She appears well-developed and well-nourished.  HENT:    Mouth/Throat: Oropharynx is clear and moist. No oropharyngeal exudate.  Eyes: Pupils are equal, round, and reactive to light. No scleral icterus.  Neck: Neck supple. Carotid bruit is not present. No tracheal deviation present. No thyromegaly present.  Cardiovascular: Normal rate, regular rhythm and intact distal pulses.  Exam reveals no gallop and no friction rub.   Murmur (1/6 SEM) heard. +1 pitting LE edema R>L. no calf TTP.   Pulmonary/Chest: Effort normal and breath sounds normal. No stridor. No respiratory distress. She has no wheezes. She has no rales.  Abdominal: Soft. Bowel sounds are normal. She exhibits no distension and no  Patient ID: Christina Rubio, female   DOB: 30-Nov-1950, 65 y.o.   MRN: [id number]    HISTORY AND PHYSICAL   DATE: 01/21/2016  Location:    [facility] Room Number: 127- A Place of Service: SNF (31)   Extended Emergency Contact Information Primary Emergency Contact: Daulton,Angela  [place] Phone: [phone] Relation: Daughter  Advanced Directive information Does patient have an advance directive?: No, Would patient like information on creating an advanced directive?: No - patient declined information  Chief Complaint  Patient presents with  . New Admit To SNF    HPI:  65 yo female seen today as a new admission into SNF following hospital stay for fall on 10/17th resulting in distal right femur displaced fx, postop anemia due to acute blood loss, hypothyroidism. She was taken to the OR by Ortho and underwent IM nail on 10/17th and 10/18th. Hgb 10.2-->8.7; WBC 14.6K-->10.7K with abs Neut 12.2K; Cr 0.83--> 1.29 at d/c. She presents to SNF for short term rehab.  She reports no concerns today. She is due to see Ortho soon for f/u right femur fx s/p IM nail. She would like diet changed to regular as her BP has been well controlled over the years. No nursing issues. No falls. She reports c/a falling but is tolerating tx. She is on lovemox for DVT prophylaxis. Pain controlled on roxicodone  HTN - BP stable on metoprolol succ and diovan HCT  Hypothyroidism - stable on levothyroxine  MDD/anxiety - stable on xanax, wellbutrin XL and sertraline Anemia - Hgb 8.7 at d/c. She takes ferrous sulfate  Chronic pain syndrome/spinal stenosis - she is stable on neurontin and prn roxicodone and robaxin  Hyperlipidemia - takes lipitor. No myalgias    Past Medical History:  Diagnosis Date  . Hypertension   . PONV (postoperative nausea and vomiting)   . Spinal stenosis   . Thyroid disease     Past Surgical History:  Procedure Laterality Date  . CESAREAN  SECTION    . CHOLECYSTECTOMY    . COLON SURGERY    . FEMUR IM NAIL Right 01/09/2016   Procedure: INTRAMEDULLARY (IM) RETROGRADE FEMORAL NAILING RIGHT DISTAL FEMUR;  Surgeon: Dorna Leitz, MD;  Location: [facility];  Service: Orthopedics;  Laterality: Right;  . LAPAROSCOPIC GASTRIC SLEEVE RESECTION    . Right thyroidectomy    . TONSILLECTOMY      Patient Care Team: Leeroy Cha, MD as PCP - General (Internal Medicine) Dorna Leitz, MD as Consulting Physician (Orthopedic Surgery)  Social History   Social History  . Marital status: Unknown    Spouse name: N/A  . Number of children: N/A  . Years of education: N/A   Occupational History  . Not on file.   Social History Main Topics  . Smoking status: Never Smoker  . Smokeless tobacco: Never Used  . Alcohol use No  . Drug use: No  . Sexual activity: No   Other Topics Concern  . Not on file   Social History Narrative  . No narrative on file     reports that she has never smoked. She has never used smokeless tobacco. She reports that she does not drink alcohol or use drugs.  History reviewed. No pertinent family history. No family status information on file.     There is no immunization history on file for this patient.  Allergies  Allergen Reactions  . Epinephrine Palpitations    faint  . Penicillins Rash    Has patient  Patient ID: Christina Rubio, female   DOB: 30-Nov-1950, 65 y.o.   MRN: [id number]    HISTORY AND PHYSICAL   DATE: 01/21/2016  Location:    [facility] Room Number: 127- A Place of Service: SNF (31)   Extended Emergency Contact Information Primary Emergency Contact: Daulton,Angela  [place] Phone: [phone] Relation: Daughter  Advanced Directive information Does patient have an advance directive?: No, Would patient like information on creating an advanced directive?: No - patient declined information  Chief Complaint  Patient presents with  . New Admit To SNF    HPI:  65 yo female seen today as a new admission into SNF following hospital stay for fall on 10/17th resulting in distal right femur displaced fx, postop anemia due to acute blood loss, hypothyroidism. She was taken to the OR by Ortho and underwent IM nail on 10/17th and 10/18th. Hgb 10.2-->8.7; WBC 14.6K-->10.7K with abs Neut 12.2K; Cr 0.83--> 1.29 at d/c. She presents to SNF for short term rehab.  She reports no concerns today. She is due to see Ortho soon for f/u right femur fx s/p IM nail. She would like diet changed to regular as her BP has been well controlled over the years. No nursing issues. No falls. She reports c/a falling but is tolerating tx. She is on lovemox for DVT prophylaxis. Pain controlled on roxicodone  HTN - BP stable on metoprolol succ and diovan HCT  Hypothyroidism - stable on levothyroxine  MDD/anxiety - stable on xanax, wellbutrin XL and sertraline Anemia - Hgb 8.7 at d/c. She takes ferrous sulfate  Chronic pain syndrome/spinal stenosis - she is stable on neurontin and prn roxicodone and robaxin  Hyperlipidemia - takes lipitor. No myalgias    Past Medical History:  Diagnosis Date  . Hypertension   . PONV (postoperative nausea and vomiting)   . Spinal stenosis   . Thyroid disease     Past Surgical History:  Procedure Laterality Date  . CESAREAN  SECTION    . CHOLECYSTECTOMY    . COLON SURGERY    . FEMUR IM NAIL Right 01/09/2016   Procedure: INTRAMEDULLARY (IM) RETROGRADE FEMORAL NAILING RIGHT DISTAL FEMUR;  Surgeon: Dorna Leitz, MD;  Location: [facility];  Service: Orthopedics;  Laterality: Right;  . LAPAROSCOPIC GASTRIC SLEEVE RESECTION    . Right thyroidectomy    . TONSILLECTOMY      Patient Care Team: Leeroy Cha, MD as PCP - General (Internal Medicine) Dorna Leitz, MD as Consulting Physician (Orthopedic Surgery)  Social History   Social History  . Marital status: Unknown    Spouse name: N/A  . Number of children: N/A  . Years of education: N/A   Occupational History  . Not on file.   Social History Main Topics  . Smoking status: Never Smoker  . Smokeless tobacco: Never Used  . Alcohol use No  . Drug use: No  . Sexual activity: No   Other Topics Concern  . Not on file   Social History Narrative  . No narrative on file     reports that she has never smoked. She has never used smokeless tobacco. She reports that she does not drink alcohol or use drugs.  History reviewed. No pertinent family history. No family status information on file.     There is no immunization history on file for this patient.  Allergies  Allergen Reactions  . Epinephrine Palpitations    faint  . Penicillins Rash    Has patient  Patient ID: Christina Rubio, female   DOB: 30-Nov-1950, 65 y.o.   MRN: [id number]    HISTORY AND PHYSICAL   DATE: 01/21/2016  Location:    [facility] Room Number: 127- A Place of Service: SNF (31)   Extended Emergency Contact Information Primary Emergency Contact: Daulton,Angela  [place] Phone: [phone] Relation: Daughter  Advanced Directive information Does patient have an advance directive?: No, Would patient like information on creating an advanced directive?: No - patient declined information  Chief Complaint  Patient presents with  . New Admit To SNF    HPI:  65 yo female seen today as a new admission into SNF following hospital stay for fall on 10/17th resulting in distal right femur displaced fx, postop anemia due to acute blood loss, hypothyroidism. She was taken to the OR by Ortho and underwent IM nail on 10/17th and 10/18th. Hgb 10.2-->8.7; WBC 14.6K-->10.7K with abs Neut 12.2K; Cr 0.83--> 1.29 at d/c. She presents to SNF for short term rehab.  She reports no concerns today. She is due to see Ortho soon for f/u right femur fx s/p IM nail. She would like diet changed to regular as her BP has been well controlled over the years. No nursing issues. No falls. She reports c/a falling but is tolerating tx. She is on lovemox for DVT prophylaxis. Pain controlled on roxicodone  HTN - BP stable on metoprolol succ and diovan HCT  Hypothyroidism - stable on levothyroxine  MDD/anxiety - stable on xanax, wellbutrin XL and sertraline Anemia - Hgb 8.7 at d/c. She takes ferrous sulfate  Chronic pain syndrome/spinal stenosis - she is stable on neurontin and prn roxicodone and robaxin  Hyperlipidemia - takes lipitor. No myalgias    Past Medical History:  Diagnosis Date  . Hypertension   . PONV (postoperative nausea and vomiting)   . Spinal stenosis   . Thyroid disease     Past Surgical History:  Procedure Laterality Date  . CESAREAN  SECTION    . CHOLECYSTECTOMY    . COLON SURGERY    . FEMUR IM NAIL Right 01/09/2016   Procedure: INTRAMEDULLARY (IM) RETROGRADE FEMORAL NAILING RIGHT DISTAL FEMUR;  Surgeon: Dorna Leitz, MD;  Location: [facility];  Service: Orthopedics;  Laterality: Right;  . LAPAROSCOPIC GASTRIC SLEEVE RESECTION    . Right thyroidectomy    . TONSILLECTOMY      Patient Care Team: Leeroy Cha, MD as PCP - General (Internal Medicine) Dorna Leitz, MD as Consulting Physician (Orthopedic Surgery)  Social History   Social History  . Marital status: Unknown    Spouse name: N/A  . Number of children: N/A  . Years of education: N/A   Occupational History  . Not on file.   Social History Main Topics  . Smoking status: Never Smoker  . Smokeless tobacco: Never Used  . Alcohol use No  . Drug use: No  . Sexual activity: No   Other Topics Concern  . Not on file   Social History Narrative  . No narrative on file     reports that she has never smoked. She has never used smokeless tobacco. She reports that she does not drink alcohol or use drugs.  History reviewed. No pertinent family history. No family status information on file.     There is no immunization history on file for this patient.  Allergies  Allergen Reactions  . Epinephrine Palpitations    faint  . Penicillins Rash    Has patient  Patient ID: Christina Rubio, female   DOB: 30-Nov-1950, 65 y.o.   MRN: [id number]    HISTORY AND PHYSICAL   DATE: 01/21/2016  Location:    [facility] Room Number: 127- A Place of Service: SNF (31)   Extended Emergency Contact Information Primary Emergency Contact: Daulton,Angela  [place] Phone: [phone] Relation: Daughter  Advanced Directive information Does patient have an advance directive?: No, Would patient like information on creating an advanced directive?: No - patient declined information  Chief Complaint  Patient presents with  . New Admit To SNF    HPI:  65 yo female seen today as a new admission into SNF following hospital stay for fall on 10/17th resulting in distal right femur displaced fx, postop anemia due to acute blood loss, hypothyroidism. She was taken to the OR by Ortho and underwent IM nail on 10/17th and 10/18th. Hgb 10.2-->8.7; WBC 14.6K-->10.7K with abs Neut 12.2K; Cr 0.83--> 1.29 at d/c. She presents to SNF for short term rehab.  She reports no concerns today. She is due to see Ortho soon for f/u right femur fx s/p IM nail. She would like diet changed to regular as her BP has been well controlled over the years. No nursing issues. No falls. She reports c/a falling but is tolerating tx. She is on lovemox for DVT prophylaxis. Pain controlled on roxicodone  HTN - BP stable on metoprolol succ and diovan HCT  Hypothyroidism - stable on levothyroxine  MDD/anxiety - stable on xanax, wellbutrin XL and sertraline Anemia - Hgb 8.7 at d/c. She takes ferrous sulfate  Chronic pain syndrome/spinal stenosis - she is stable on neurontin and prn roxicodone and robaxin  Hyperlipidemia - takes lipitor. No myalgias    Past Medical History:  Diagnosis Date  . Hypertension   . PONV (postoperative nausea and vomiting)   . Spinal stenosis   . Thyroid disease     Past Surgical History:  Procedure Laterality Date  . CESAREAN  SECTION    . CHOLECYSTECTOMY    . COLON SURGERY    . FEMUR IM NAIL Right 01/09/2016   Procedure: INTRAMEDULLARY (IM) RETROGRADE FEMORAL NAILING RIGHT DISTAL FEMUR;  Surgeon: Dorna Leitz, MD;  Location: [facility];  Service: Orthopedics;  Laterality: Right;  . LAPAROSCOPIC GASTRIC SLEEVE RESECTION    . Right thyroidectomy    . TONSILLECTOMY      Patient Care Team: Leeroy Cha, MD as PCP - General (Internal Medicine) Dorna Leitz, MD as Consulting Physician (Orthopedic Surgery)  Social History   Social History  . Marital status: Unknown    Spouse name: N/A  . Number of children: N/A  . Years of education: N/A   Occupational History  . Not on file.   Social History Main Topics  . Smoking status: Never Smoker  . Smokeless tobacco: Never Used  . Alcohol use No  . Drug use: No  . Sexual activity: No   Other Topics Concern  . Not on file   Social History Narrative  . No narrative on file     reports that she has never smoked. She has never used smokeless tobacco. She reports that she does not drink alcohol or use drugs.  History reviewed. No pertinent family history. No family status information on file.     There is no immunization history on file for this patient.  Allergies  Allergen Reactions  . Epinephrine Palpitations    faint  . Penicillins Rash    Has patient

## 2016-01-29 ENCOUNTER — Other Ambulatory Visit: Payer: Self-pay | Admitting: *Deleted

## 2016-01-29 MED ORDER — ALPRAZOLAM 0.5 MG PO TABS: ORAL_TABLET | ORAL | 0 refills | Status: DC

## 2016-01-29 NOTE — Telephone Encounter (Signed)
[facility] #[phone] Fax:[phone]  

## 2016-02-10 DIAGNOSIS — F329 Major depressive disorder, single episode, unspecified: Secondary | ICD-10-CM | POA: Insufficient documentation

## 2016-02-10 DIAGNOSIS — I10 Essential (primary) hypertension: Secondary | ICD-10-CM | POA: Insufficient documentation

## 2016-02-10 DIAGNOSIS — E1169 Type 2 diabetes mellitus with other specified complication: Secondary | ICD-10-CM | POA: Insufficient documentation

## 2016-02-10 DIAGNOSIS — I152 Hypertension secondary to endocrine disorders: Secondary | ICD-10-CM | POA: Insufficient documentation

## 2016-02-10 DIAGNOSIS — E785 Hyperlipidemia, unspecified: Secondary | ICD-10-CM | POA: Insufficient documentation

## 2016-02-10 DIAGNOSIS — E034 Atrophy of thyroid (acquired): Secondary | ICD-10-CM | POA: Insufficient documentation

## 2016-02-10 DIAGNOSIS — E1159 Type 2 diabetes mellitus with other circulatory complications: Secondary | ICD-10-CM | POA: Insufficient documentation

## 2016-02-12 ENCOUNTER — Non-Acute Institutional Stay (SKILLED_NURSING_FACILITY): Payer: BLUE CROSS/BLUE SHIELD | Admitting: Internal Medicine

## 2016-02-12 DIAGNOSIS — F418 Other specified anxiety disorders: Secondary | ICD-10-CM | POA: Diagnosis not present

## 2016-02-12 DIAGNOSIS — R05 Cough: Secondary | ICD-10-CM

## 2016-02-12 DIAGNOSIS — E034 Atrophy of thyroid (acquired): Secondary | ICD-10-CM | POA: Diagnosis not present

## 2016-02-12 DIAGNOSIS — D62 Acute posthemorrhagic anemia: Secondary | ICD-10-CM

## 2016-02-12 DIAGNOSIS — R059 Cough, unspecified: Secondary | ICD-10-CM

## 2016-02-12 DIAGNOSIS — I10 Essential (primary) hypertension: Secondary | ICD-10-CM | POA: Diagnosis not present

## 2016-02-12 DIAGNOSIS — S72451D Displaced supracondylar fracture without intracondylar extension of lower end of right femur, subsequent encounter for closed fracture with routine healing: Secondary | ICD-10-CM

## 2016-02-12 NOTE — Progress Notes (Signed)
This is a routine-acute  visit.  Level care skilled.  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  Acute visit secondary to cough.  History of present illness.  Patient is a pleasant 65 year old seen today for follow-up of chronic medical conditions as noted above.  She is also complaining of a cough.   She does not complain of shortness of breath and vital signs appear to be stable.  In regards to her chronic medical conditions she is here for rehabilitation--after sustaining a distal right femur fracture after a fall.  She also had postop anemia.  With a hemoglobin of 8.7 on 01/13/2016 will recheck a CBC.--She is on iron  Other than the cough she does not really have any complaints today.  She does have a history of chronic pain with spinal stenosis this appears to be stable on Neurontin and when necessary Roxicodone and Robaxin.  She does have a history of hypothyroidism she is on Synthroid we will check a TSH.  Has a depression history as well as anxiety but this appears stable on Wellbutrin XL-Zoloft-as well as Xanax.  Currently she is resting in bed comfortably again appears to be having a mild respiratory issue  Past Medical History:  Diagnosis Date  . Hypertension   . PONV (postoperative nausea and vomiting)   . Spinal stenosis   . Thyroid disease          Past Surgical History:  Procedure Laterality Date  . CESAREAN SECTION    . CHOLECYSTECTOMY    . COLON SURGERY    . FEMUR IM NAIL Right 01/09/2016   Procedure: INTRAMEDULLARY (IM) RETROGRADE FEMORAL NAILING RIGHT DISTAL FEMUR;  Surgeon: Dorna Leitz, MD;  Location: [facility];  Service: Orthopedics;  Laterality: Right;  . LAPAROSCOPIC GASTRIC SLEEVE RESECTION    . Right thyroidectomy    . TONSILLECTOMY      Social History          Social History  . Marital status: Unknown    Spouse name: N/A  . Number of children: N/A  . Years of education: N/A      Occupational History  . Not on file.       Social History Main Topics  . Smoking status: Never Smoker  . Smokeless tobacco: Never Used  . Alcohol use No  . Drug use: No  . Sexual activity: No       Other Topics Concern  . Not on file      Social History Narrative  . No narrative on file   No family history on file.    VITAL SIGNS  Temperature 97.5 pulse 62 respirations 18 blood pressure 118/63       Patient's Medications  New Prescriptions   No medications on file  Previous Medications   ALPRAZOLAM (XANAX) 0.5 MG TABLET    Take 0.5 mg by mouth at bedtime as needed for anxiety or sleep.    ATORVASTATIN (LIPITOR) 10 MG TABLET    Take 10 mg by mouth at bedtime.    BUPROPION (WELLBUTRIN XL) 300 MG 24 HR TABLET    Take 300 mg by mouth at bedtime.    ENOXAPARIN (LOVENOX) 150 MG/ML INJECTION    Inject 0.51 mLs (75 mg total) into the skin daily.   FERROUS SULFATE 325 (65 FE) MG TABLET    Take 1 tablet (325 mg total) by mouth daily with breakfast.   GABAPENTIN (NEURONTIN) 600 MG  TABLET    Take 600 mg by mouth 3 (three) times daily as needed (nerve pain).    LEVOTHYROXINE (SYNTHROID, LEVOTHROID) 50 MCG TABLET    Take 50 mcg by mouth at bedtime.    METHOCARBAMOL (ROBAXIN-750) 750 MG TABLET    Take 1 tablet (750 mg total) by mouth every 8 (eight) hours as needed for muscle spasms.   METOPROLOL SUCCINATE (TOPROL-XL) 100 MG 24 HR TABLET    Take 100 mg by mouth at bedtime.    MULTIPLE VITAMINS-MINERALS (PRESERVISION AREDS 2) CAPS    Take 1 tablet by mouth 2 (two) times daily.   OXYCODONE (OXY IR/ROXICODONE) 5 MG IMMEDIATE RELEASE TABLET    Take 1-2 tablets (5-10 mg total) by mouth every 3 (three) hours as needed for breakthrough pain.   SERTRALINE (ZOLOFT) 50 MG TABLET    Take 50 mg by mouth at bedtime.    VALSARTAN-HYDROCHLOROTHIAZIDE  (DIOVAN-HCT) 160-25 MG TABLET    Take 1 tablet by mouth at bedtime.   Modified Medications   No medications on file  Discontinued Medications   No medications on file     SIGNIFICANT DIAGNOSTIC EXAMS  01-08-16: right knee x-ray: Severely displaced distal right femoral fracture.  01-08-16: right hip and pelvic x-ray: There is no evidence of hip fracture or dislocation. There is no evidence of arthropathy or other focal bone abnormality.  01-08-16: chest x-ray: No acute cardiopulmonary abnormality. Cardiomegaly and calcified aortic atherosclerosis.   LABS REVIEWED:   01-08-16; wbc 14.6 ;hgb 12.8; hct 38.2 ;mcv 89.7; plt 191; glucose 121; bun 19; creat 0.83; k+ 3.5; na++ 139 01-10-16: wbc 11.0; hgb 9.2; hct 29.2; mcv 89.8; plt 165; glucose 99; bun 22; creat 1.29; k+ 3.9; na++ 139 01-12-16: wbc 11.5; hgb 8.7 hct 26.6; mcv 89.6; plt 162    Review of Systems  Constitutional: Negative for malaise/fatigue.  Respiratory: Positive for cough-negative for shortness of breath  --says occasionally she will have some mild wheezing Cardiovascular: Negative for chest pain, palpitations and significant leg swelling.  Gastrointestinal: Negative for abdominal pain, constipation and heartburn.  Musculoskeletal: Negative for back pain, joint pain and myalgias.       Skin: Negative.   for rashes or itching Neurological: Negative for dizziness.  Psychiatric/Behavioral: The patient is not nervous/anxious.     Physical Exam  Constitutional: She is oriented to person, place, and time. No distress. Sitting comfortably in bed  Obese   Eyes: Conjunctivae are normal.  Neck: Neck supple. No JVD present. No thyromegaly present.  Cardiovascular: Normal rate, regular rhythm and intact distal pulses.   Respiratory: Effort normal and breath sounds normal. No respiratory distress. She has no wheezes.  GI: Soft. Bowel sounds are normal. She exhibits no distension. There is no tenderness.    Musculoskeletal: She exhibits minimal edema.  Able to move all extremities  Is status post right femur fracture Lymphadenopathy:    She has no cervical adenopathy.  Neurological: She is alert and oriented to person, place, and time.  Skin: Skin is warm and dry. She is not diaphoretic.     Psychiatric: She has a normal mood and affect. Very pleasant and appropriate     ASSESSMENT/ PLAN:  1. Hypertension: will continue toprol xl 100 gm daily and diovan hct 160/25 mg daily at this point appears to be stable most recently 118/63  2. Dyslipidemia: will continue lipitor 10 mg daily  3. Anemia: hgb 8.7; will continue iron daily and recheck a CBC  4. Hypothyroidism: will continue synthroid  50 mcg daily will check a TSH  5. Depression with anxiety: will continue wellbutrin xl 300 mg daily zoloft 50 mg daily and has xanax 0.5 mg nightly as needed   6.  Displace supracondylar fracture of distal end of right femur: will follow up with orthopedics as indicated; will continue therapy as directed; will continue robaxin 750 mg every 8 hours as needed; nuerontin 600 mg three times daily and oxycodone 5 or 10 mg every 3 hours as needed.     History of chronic pain-again this appears to be stable on above listed medications she does have a history of spinal stenosis as well.  #8 history of cough-will start Mucinex 600 mg twice a day for 7 days-also duo nebs as needed every 6 hours when necessary she does state that at times she does have some mild wheezing.  Also will order a chest x-ray.  Continue the  saline nasal spray that she's been using.  #9-history of renal insufficiency last creatinine was 1.29 on October 19 will update this.    [id number] Time spent with patient greater than 35   minutes >50% time spent counseling; reviewing medical record; tests; labs; and developing future plan of care

## 2016-02-13 LAB — BASIC METABOLIC PANEL
BUN: 12 mg/dL (ref 4–21)
CREATININE: 0.8 mg/dL (ref 0.5–1.1)
GLUCOSE: 86 mg/dL
POTASSIUM: 3.9 mmol/L (ref 3.4–5.3)
Sodium: 143 mmol/L (ref 137–147)

## 2016-02-13 LAB — HEPATIC FUNCTION PANEL
ALT: 13 U/L (ref 7–35)
AST: 21 U/L (ref 13–35)
Alkaline Phosphatase: 71 U/L (ref 25–125)
Bilirubin, Total: 0.5 mg/dL

## 2016-02-13 LAB — CBC AND DIFFERENTIAL
HCT: 40 % (ref 36–46)
Hemoglobin: 12.7 g/dL (ref 12.0–16.0)
Platelets: 243 10*3/uL (ref 150–399)
WBC: 8.3 10*3/mL

## 2016-02-13 LAB — TSH: TSH: 3.95 u[IU]/mL (ref 0.41–5.90)

## 2016-03-05 ENCOUNTER — Other Ambulatory Visit: Payer: Self-pay | Admitting: Orthopedic Surgery

## 2016-03-05 DIAGNOSIS — M5416 Radiculopathy, lumbar region: Secondary | ICD-10-CM

## 2016-03-07 ENCOUNTER — Encounter: Payer: Self-pay | Admitting: Adult Health

## 2016-03-07 ENCOUNTER — Non-Acute Institutional Stay (SKILLED_NURSING_FACILITY): Payer: BLUE CROSS/BLUE SHIELD | Admitting: Adult Health

## 2016-03-07 DIAGNOSIS — F418 Other specified anxiety disorders: Secondary | ICD-10-CM | POA: Diagnosis not present

## 2016-03-07 DIAGNOSIS — S72451D Displaced supracondylar fracture without intracondylar extension of lower end of right femur, subsequent encounter for closed fracture with routine healing: Secondary | ICD-10-CM

## 2016-03-07 DIAGNOSIS — I10 Essential (primary) hypertension: Secondary | ICD-10-CM

## 2016-03-07 DIAGNOSIS — E785 Hyperlipidemia, unspecified: Secondary | ICD-10-CM | POA: Diagnosis not present

## 2016-03-07 NOTE — Progress Notes (Signed)
Patient ID: Christina Rubio, female   DOB: 1950/04/23, 65 y.o.   MRN: [id number]   Location:   [place] Room Number: 127-A Place of Service:  SNF (31)    CODE STATUS: Full Code  Allergies  Allergen Reactions  . Epinephrine Palpitations    faint  . Penicillins Rash    Has patient had a PCN reaction causing immediate rash, facial/tongue/throat swelling, SOB or lightheadedness with hypotension:  Yes Has patient had a PCN reaction causing severe rash involving mucus membranes or skin necrosis: No Has patient had a PCN reaction that required hospitalization: No Has patient had a PCN reaction occurring within the last 10 years: No If all of the above answers are "NO", then may proceed with Cephalosporin use.;    Chief Complaint  Patient presents with  . Discharge Note    Discharge from facility    HPI:  She is being discharged to home with home health for pt/ot/rn. She will need a bariatric standard wheelchair; 3:1 commode; tube bench; long gait belt; 6 foot ramp; bathroom grab bars;  semi-electric bed. She will need her prescriptions to be written and will need to follow up with her medical provider.  She had been hospitalized for a right femur fracture.   Past Medical History:  Diagnosis Date  . Hypertension   . PONV (postoperative nausea and vomiting)   . Spinal stenosis   . Thyroid disease     Past Surgical History:  Procedure Laterality Date  . CESAREAN SECTION    . CHOLECYSTECTOMY    . COLON SURGERY    . FEMUR IM NAIL Right 01/09/2016   Procedure: INTRAMEDULLARY (IM) RETROGRADE FEMORAL NAILING RIGHT DISTAL FEMUR;  Surgeon: Dorna Leitz, MD;  Location: [facility];  Service: Orthopedics;  Laterality: Right;  . LAPAROSCOPIC GASTRIC SLEEVE RESECTION    . Right thyroidectomy    . TONSILLECTOMY      Social History   Social History  . Marital status: Widowed    Spouse name: N/A  . Number of children: N/A  . Years of education: N/A   Occupational History  .  Not on file.   Social History Main Topics  . Smoking status: Never Smoker  . Smokeless tobacco: Never Used  . Alcohol use No  . Drug use: No  . Sexual activity: No   Other Topics Concern  . Not on file   Social History Narrative  . No narrative on file   History reviewed. No pertinent family history.  VITAL SIGNS BP 123/62   Pulse 62   Temp 97.7 F (36.5 C) (Oral)   Resp 18   Ht 5\' 9"  (1.753 m)   Wt (!) 344 lb 2 oz (156.1 kg)   SpO2 98%   BMI 50.82 kg/m   Patient's Medications  New Prescriptions   No medications on file  Previous Medications   ACETAMINOPHEN (CHLORASEPTIC SORE THROAT PO)    Take 1 Units by mouth every 6 (six) hours as needed.   ACETAMINOPHEN (TYLENOL) 325 MG TABLET    Take by mouth every 6 (six) hours as needed.   ALPRAZOLAM (XANAX) 0.5 MG TABLET    Take one tablet by mouth every morning; Take one tablet by mouth once daily as needed   ATORVASTATIN (LIPITOR) 10 MG TABLET    Take 10 mg by mouth at bedtime.    BUPROPION (WELLBUTRIN XL) 300 MG 24 HR TABLET    Take 300 mg by mouth at bedtime.    FERROUS  SULFATE 325 (65 FE) MG TABLET    Take 1 tablet (325 mg total) by mouth daily with breakfast.   GABAPENTIN (NEURONTIN) 600 MG TABLET    Take 600 mg by mouth 3 (three) times daily as needed (nerve pain).    IPRATROPIUM-ALBUTEROL (DUONEB) 0.5-2.5 (3) MG/3ML SOLN    Take 3 mLs by nebulization every 6 (six) hours as needed.   LEVOTHYROXINE (SYNTHROID, LEVOTHROID) 50 MCG TABLET    Take 50 mcg by mouth at bedtime.    METHOCARBAMOL (ROBAXIN-750) 750 MG TABLET    Take 1 tablet (750 mg total) by mouth every 8 (eight) hours as needed for muscle spasms.   METOPROLOL SUCCINATE (TOPROL-XL) 100 MG 24 HR TABLET    Take 100 mg by mouth at bedtime.    MULTIPLE VITAMINS-MINERALS (PRESERVISION AREDS 2) CAPS    Take 1 tablet by mouth 2 (two) times daily.   OXYCODONE (OXY IR/ROXICODONE) 5 MG IMMEDIATE RELEASE TABLET    Take 1-2 tablets (5-10 mg total) by mouth every 3 (three) hours  as needed for breakthrough pain.   SERTRALINE (ZOLOFT) 50 MG TABLET    Take 50 mg by mouth at bedtime.    VALSARTAN-HYDROCHLOROTHIAZIDE (DIOVAN-HCT) 160-25 MG TABLET    Take 1 tablet by mouth at bedtime.   Modified Medications   No medications on file  Discontinued Medications   ENOXAPARIN (LOVENOX) 150 MG/ML INJECTION    Inject 0.51 mLs (75 mg total) into the skin daily.     SIGNIFICANT DIAGNOSTIC EXAMS  01-08-16: right knee x-ray: Severely displaced distal right femoral fracture.  01-08-16: right hip and pelvic x-ray: There is no evidence of hip fracture or dislocation. There is no evidence of arthropathy or other focal bone abnormality.  01-08-16: chest x-ray: No acute cardiopulmonary abnormality. Cardiomegaly and calcified aortic atherosclerosis.   LABS REVIEWED:   01-08-16; wbc 14.6 ;hgb 12.8; hct 38.2 ;mcv 89.7; plt 191; glucose 121; bun 19; creat 0.83; k+ 3.5; na++ 139 01-10-16: wbc 11.0; hgb 9.2; hct 29.2; mcv 89.8; plt 165; glucose 99; bun 22; creat 1.29; k+ 3.9; na++ 139 01-12-16: wbc 11.5; hgb 8.6; hct 26.6; mcv 89.6; plt 162    Review of Systems  Constitutional: Negative for malaise/fatigue.  Respiratory: Negative for cough and shortness of breath.   Cardiovascular: Negative for chest pain, palpitations and leg swelling.  Gastrointestinal: Negative for abdominal pain, constipation and heartburn.  Musculoskeletal: Negative for back pain, joint pain and myalgias.       Has ace wrap on left lower leg   Skin: Negative.   Neurological: Negative for dizziness.  Psychiatric/Behavioral: The patient is not nervous/anxious.     Physical Exam  Constitutional: She is oriented to person, place, and time. No distress.  Obese   Eyes: Conjunctivae are normal.  Neck: Neck supple. No JVD present. No thyromegaly present.  Cardiovascular: Normal rate, regular rhythm and intact distal pulses.   Respiratory: Effort normal and breath sounds normal. No respiratory distress. She has no  wheezes.  GI: Soft. Bowel sounds are normal. She exhibits no distension. There is no tenderness.  Musculoskeletal: She exhibits no edema.  Able to move all extremities  Is status post right femur fracture Ace wrap in place on right lower leg   Lymphadenopathy:    She has no cervical adenopathy.  Neurological: She is alert and oriented to person, place, and time.  Skin: Skin is warm and dry. She is not diaphoretic.  Incision line without signs of infection present   Psychiatric: She  has a normal mood and affect.      ASSESSMENT/ PLAN:  Patient is being discharged with the following home health services:  Pt/ot/rn: to evaluate and treat as indicated for gait balance strength adl training and medication management   Patient is being discharged with the following durable medical equipment:  Bariatric standard wheelchair with cushion elevated leg rests; anti-tippers; brake extensions in order to allow her to maintain her current level of independence with her adl's which cannot be achieved with a walker; she can self propel Bariatric 3:1 commode; tub bench; 6 foot rental ramp; long gait belt; bathroom grab bars  Semi-electric bed: for chronic pain management of her back pain and spinal stenosis which will alleviate pain allowing for positioning that cannot be achieved in a standard bed.   Patient has been advised to f/u with their PCP in 1-2 weeks to bring them up to date on their rehab stay.  Social services at facility was responsible for arranging this appointment.  Pt was provided with a 30 day supply of prescriptions for medications and refills must be obtained from their PCP.  For controlled substances, a more limited supply may be provided adequate until PCP appointment only. #20 oxycodone 5 mg tabs; #15 xanax 0.5 mg tabs    Time spent with patient  50  minutes >50% time spent counseling; reviewing medical record; tests; labs; and developing future plan of care    Ok Edwards  NP [facility]  Contact [phone] Monday through Friday 8am- 5pm  After hours call [phone]

## 2016-03-14 ENCOUNTER — Encounter: Payer: Self-pay | Admitting: Emergency Medicine

## 2016-03-14 ENCOUNTER — Emergency Department
Admission: EM | Admit: 2016-03-14 | Discharge: 2016-03-15 | Disposition: A | Discharge status: Home / Self Care | Payer: BLUE CROSS/BLUE SHIELD | Attending: Emergency Medicine | Admitting: Emergency Medicine

## 2016-03-14 DIAGNOSIS — B349 Viral infection, unspecified: Secondary | ICD-10-CM | POA: Insufficient documentation

## 2016-03-14 DIAGNOSIS — Z79899 Other long term (current) drug therapy: Secondary | ICD-10-CM | POA: Insufficient documentation

## 2016-03-14 DIAGNOSIS — I10 Essential (primary) hypertension: Secondary | ICD-10-CM | POA: Diagnosis not present

## 2016-03-14 DIAGNOSIS — E039 Hypothyroidism, unspecified: Secondary | ICD-10-CM | POA: Diagnosis not present

## 2016-03-14 DIAGNOSIS — R300 Dysuria: Secondary | ICD-10-CM | POA: Diagnosis present

## 2016-03-14 LAB — URINALYSIS, COMPLETE (UACMP) WITH MICROSCOPIC
Bacteria, UA: NONE SEEN
Bilirubin Urine: NEGATIVE
GLUCOSE, UA: NEGATIVE mg/dL
HGB URINE DIPSTICK: NEGATIVE
KETONES UR: 5 mg/dL — AB
LEUKOCYTES UA: NEGATIVE
Nitrite: NEGATIVE
PROTEIN: NEGATIVE mg/dL
Specific Gravity, Urine: 1.004 — ABNORMAL LOW (ref 1.005–1.030)
Squamous Epithelial / LPF: NONE SEEN
pH: 9 — ABNORMAL HIGH (ref 5.0–8.0)

## 2016-03-14 LAB — CBC WITH DIFFERENTIAL/PLATELET
BASOS ABS: 0.1 10*3/uL (ref 0–0.1)
BASOS PCT: 1 %
EOS ABS: 0.1 10*3/uL (ref 0–0.7)
EOS PCT: 1 %
HCT: 38.1 % (ref 35.0–47.0)
Hemoglobin: 13 g/dL (ref 12.0–16.0)
Lymphocytes Relative: 27 %
Lymphs Abs: 2.5 10*3/uL (ref 1.0–3.6)
MCH: 29.7 pg (ref 26.0–34.0)
MCHC: 34.2 g/dL (ref 32.0–36.0)
MCV: 86.9 fL (ref 80.0–100.0)
Monocytes Absolute: 0.6 10*3/uL (ref 0.2–0.9)
Monocytes Relative: 7 %
Neutro Abs: 5.8 10*3/uL (ref 1.4–6.5)
Neutrophils Relative %: 64 %
PLATELETS: 263 10*3/uL (ref 150–440)
RBC: 4.39 MIL/uL (ref 3.80–5.20)
RDW: 14 % (ref 11.5–14.5)
WBC: 9 10*3/uL (ref 3.6–11.0)

## 2016-03-14 MED ORDER — KETOROLAC TROMETHAMINE 30 MG/ML IJ SOLN
30.0000 mg | Freq: Once | INTRAMUSCULAR | Status: AC
  Administered 2016-03-14: 30 mg via INTRAVENOUS
  Filled 2016-03-14: qty 1

## 2016-03-14 MED ORDER — ONDANSETRON HCL 4 MG/2ML IJ SOLN
4.0000 mg | Freq: Once | INTRAMUSCULAR | Status: AC
  Administered 2016-03-14: 4 mg via INTRAVENOUS
  Filled 2016-03-14: qty 2

## 2016-03-14 MED ORDER — SODIUM CHLORIDE 0.9 % IV SOLN
1000.0000 mL | Freq: Once | INTRAVENOUS | Status: AC
Start: 2016-03-14 — End: 2016-03-15
  Administered 2016-03-14: 1000 mL via INTRAVENOUS

## 2016-03-14 NOTE — ED Provider Notes (Signed)
[facility] Emergency Department Provider Note        Time seen: ----------------------------------------- 11:50 PM on 03/14/2016 -----------------------------------------    I have reviewed the triage vital signs and the nursing notes.   HISTORY  Chief Complaint Fever; Nausea; Emesis; and Dysuria    HPI Christina Rubio is a 65 y.o. female who presents to ER for urinary frequency and dysuria for the last 24 hours. Patient states she's now has nausea and vomiting with fevers, chills and body aches. Reportedly she recently had a right femur fracture and she has chronic low back pain. She presents stating she feels generally ill all over. She has had feelings of a UTI. Nothing makes her symptoms better.   Past Medical History:  Diagnosis Date  . Hypertension   . PONV (postoperative nausea and vomiting)   . Spinal stenosis   . Thyroid disease     Patient Active Problem List   Diagnosis Date Noted  . Essential hypertension, benign 02/10/2016  . Dyslipidemia 02/10/2016  . Depression with anxiety 02/10/2016  . Hypothyroidism due to acquired atrophy of thyroid 02/10/2016  . Postoperative anemia due to acute blood loss 01/14/2016  . Morbid obesity ([place]) 01/09/2016  . Displaced supracondylar fracture of distal end of right femur without intracondylar extension ([place]) 01/08/2016    Past Surgical History:  Procedure Laterality Date  . CESAREAN SECTION    . CHOLECYSTECTOMY    . COLON SURGERY    . FEMUR IM NAIL Right 01/09/2016   Procedure: INTRAMEDULLARY (IM) RETROGRADE FEMORAL NAILING RIGHT DISTAL FEMUR;  Surgeon: Dorna Leitz, MD;  Location: [place];  Service: Orthopedics;  Laterality: Right;  . LAPAROSCOPIC GASTRIC SLEEVE RESECTION    . Right thyroidectomy    . TONSILLECTOMY      Allergies Epinephrine and Penicillins  Social History Social History  Substance Use Topics  . Smoking status: Never Smoker  . Smokeless tobacco: Never Used  . Alcohol  use No    Review of Systems Constitutional: Positive for fevers, chills, body aches Cardiovascular: Negative for chest pain. Respiratory: Negative for shortness of breath. Gastrointestinal: Negative for abdominal pain, Positive for nausea and vomiting Genitourinary: Positive for dysuria and urinary frequency Musculoskeletal: Negative for back pain. Skin: Positive for red and flushed skin Neurological: Negative for headaches, Positive for generalized weakness  10-point ROS otherwise negative.  ____________________________________________   PHYSICAL EXAM:  VITAL SIGNS: ED Triage Vitals  Enc Vitals Group     BP 03/14/16 2327 (!) 147/73     Pulse Rate 03/14/16 2327 (!) 103     Resp 03/14/16 2327 20     Temp 03/14/16 2327 98.5 F (36.9 C)     Temp Source 03/14/16 2327 Oral     SpO2 03/14/16 2327 100 %     Weight 03/14/16 2327 (!) 340 lb (154.2 kg)     Height 03/14/16 2327 5\' 5"  (1.651 m)     Head Circumference --      Peak Flow --      Pain Score 03/14/16 2329 1     Pain Loc --      Pain Edu? --      Excl. in [place]? --     Constitutional: Alert and oriented. Mild distress, obese Eyes: Conjunctivae are normal. PERRL. Normal extraocular movements. ENT   Head: Normocephalic and atraumatic.   Nose: No congestion/rhinnorhea.   Mouth/Throat: Mucous membranes are moist.   Neck: No stridor. Cardiovascular: Rapid rate, regular rhythm. No murmurs, rubs, or gallops. Respiratory:  Normal respiratory effort without tachypnea nor retractions. Breath sounds are clear and equal bilaterally. No wheezes/rales/rhonchi. Gastrointestinal: Soft and nontender. Normal bowel sounds Musculoskeletal: Nontender with normal range of motion in all extremities. No lower extremity tenderness nor edema. Neurologic:  Normal speech and language. No gross focal neurologic deficits are appreciated.  Skin:  Skin is warm, dry and intact. No rash noted. Psychiatric: Depressed mood and  affect ____________________________________________  EKG: Interpreted by me. Sinus tachycardia with a rate of 111 bpm, normal PR interval, normal QRS, long QT, normal axis.  ____________________________________________  ED COURSE:  Pertinent labs & imaging results that were available during my care of the patient were reviewed by me and considered in my medical decision making (see chart for details). Clinical Course   Patient presents to ER with generalized complaints. We will assess with labs and imaging. She received IV fluids and antiemetics.  Procedures ____________________________________________   LABS (pertinent positives/negatives)  Labs Reviewed  COMPREHENSIVE METABOLIC PANEL - Abnormal; Notable for the following:       Result Value   Potassium 3.1 (*)    Glucose, Bld 119 (*)    All other components within normal limits  URINALYSIS, COMPLETE (UACMP) WITH MICROSCOPIC - Abnormal; Notable for the following:    Color, Urine STRAW (*)    APPearance CLEAR (*)    Specific Gravity, Urine 1.004 (*)    pH 9.0 (*)    Ketones, ur 5 (*)    All other components within normal limits  URINE CULTURE  CBC WITH DIFFERENTIAL/PLATELET  TROPONIN I  INFLUENZA PANEL BY PCR (TYPE A & B, H1N1)  CBG MONITORING, ED   ____________________________________________  FINAL ASSESSMENT AND PLAN  Weakness, vomiting, Viral syndrome  Plan: Patient with labs as dictated above. Patient is in no distress, labs have been within normal limits. Symptoms are likely secondary to viral etiology. She'll be discharged antiemetics and encouraged close follow-up with her doctor.   Earleen Newport, MD   Note: This dictation was prepared with Dragon dictation. Any transcriptional errors that result from this process are unintentional    Earleen Newport, MD 03/15/16 [phone]

## 2016-03-14 NOTE — ED Triage Notes (Signed)
Pt states urinary frequency and dysuria for one day. Pt states she now has nausea and vomiting, fever. Pt recently had right femur fracture. Pt states she also has chronic low back pain. Skin hot and dry.

## 2016-03-15 LAB — COMPREHENSIVE METABOLIC PANEL
ALT: 15 U/L (ref 14–54)
AST: 30 U/L (ref 15–41)
Albumin: 4 g/dL (ref 3.5–5.0)
Alkaline Phosphatase: 52 U/L (ref 38–126)
Anion gap: 10 (ref 5–15)
BUN: 7 mg/dL (ref 6–20)
CHLORIDE: 104 mmol/L (ref 101–111)
CO2: 26 mmol/L (ref 22–32)
Calcium: 9.4 mg/dL (ref 8.9–10.3)
Creatinine, Ser: 0.83 mg/dL (ref 0.44–1.00)
Glucose, Bld: 119 mg/dL — ABNORMAL HIGH (ref 65–99)
POTASSIUM: 3.1 mmol/L — AB (ref 3.5–5.1)
Sodium: 140 mmol/L (ref 135–145)
Total Bilirubin: 0.6 mg/dL (ref 0.3–1.2)
Total Protein: 7.5 g/dL (ref 6.5–8.1)

## 2016-03-15 LAB — INFLUENZA PANEL BY PCR (TYPE A & B)
INFLAPCR: NEGATIVE
Influenza B By PCR: NEGATIVE

## 2016-03-15 LAB — TROPONIN I

## 2016-03-15 MED ORDER — ONDANSETRON 4 MG PO TBDP: 4.0000 mg | ORAL_TABLET | Freq: Three times a day (TID) | ORAL | 0 refills | Status: DC | PRN

## 2016-03-15 MED ORDER — PROMETHAZINE HCL 25 MG/ML IJ SOLN
25.0000 mg | Freq: Once | INTRAMUSCULAR | Status: AC
  Administered 2016-03-15: 25 mg via INTRAVENOUS
  Filled 2016-03-15: qty 1

## 2016-03-15 NOTE — ED Notes (Signed)
Pt updated on wait for phenergan from pharmacy.

## 2016-03-15 NOTE — ED Notes (Signed)
Reviewed d/c instructions, follow-up care, prescription with patient. Pt verbalized understanding.  

## 2016-03-15 NOTE — ED Notes (Signed)
Call placed to pt's daughter to come pick pt up. Pt states she does not think she can sit in lobby in wheelchair due to spinal stenosis.

## 2016-03-15 NOTE — ED Notes (Signed)
Pt states "the nausea is a little bit better, but I still feel crampy all over". Call bell at right side.

## 2016-03-16 DIAGNOSIS — I10 Essential (primary) hypertension: Secondary | ICD-10-CM | POA: Diagnosis not present

## 2016-03-16 DIAGNOSIS — S72351H Displaced comminuted fracture of shaft of right femur, subsequent encounter for open fracture type I or II with delayed healing: Secondary | ICD-10-CM | POA: Diagnosis not present

## 2016-03-16 DIAGNOSIS — M4800 Spinal stenosis, site unspecified: Secondary | ICD-10-CM | POA: Diagnosis not present

## 2016-03-16 DIAGNOSIS — G8929 Other chronic pain: Secondary | ICD-10-CM | POA: Diagnosis not present

## 2016-03-16 LAB — URINE CULTURE: Special Requests: NORMAL

## 2016-03-21 ENCOUNTER — Ambulatory Visit
Admission: RE | Admit: 2016-03-21 | Discharge: 2016-03-21 | Disposition: A | Discharge status: Home / Self Care | Payer: BLUE CROSS/BLUE SHIELD | Source: Ambulatory Visit | Attending: Orthopedic Surgery | Admitting: Orthopedic Surgery

## 2016-03-21 ENCOUNTER — Other Ambulatory Visit: Payer: BLUE CROSS/BLUE SHIELD

## 2016-03-21 DIAGNOSIS — M5416 Radiculopathy, lumbar region: Secondary | ICD-10-CM

## 2016-04-25 ENCOUNTER — Other Ambulatory Visit: Payer: Self-pay | Admitting: Internal Medicine

## 2016-04-25 DIAGNOSIS — Z1231 Encounter for screening mammogram for malignant neoplasm of breast: Secondary | ICD-10-CM

## 2016-05-29 ENCOUNTER — Ambulatory Visit: Payer: BLUE CROSS/BLUE SHIELD

## 2016-12-19 DIAGNOSIS — H02839 Dermatochalasis of unspecified eye, unspecified eyelid: Secondary | ICD-10-CM | POA: Diagnosis not present

## 2016-12-19 DIAGNOSIS — I10 Essential (primary) hypertension: Secondary | ICD-10-CM | POA: Diagnosis not present

## 2016-12-19 DIAGNOSIS — H2513 Age-related nuclear cataract, bilateral: Secondary | ICD-10-CM | POA: Diagnosis not present

## 2016-12-19 DIAGNOSIS — H18413 Arcus senilis, bilateral: Secondary | ICD-10-CM | POA: Diagnosis not present

## 2017-01-28 DIAGNOSIS — L821 Other seborrheic keratosis: Secondary | ICD-10-CM | POA: Diagnosis not present

## 2017-01-28 DIAGNOSIS — Z85828 Personal history of other malignant neoplasm of skin: Secondary | ICD-10-CM | POA: Diagnosis not present

## 2017-01-28 DIAGNOSIS — L720 Epidermal cyst: Secondary | ICD-10-CM | POA: Diagnosis not present

## 2017-01-28 DIAGNOSIS — C44329 Squamous cell carcinoma of skin of other parts of face: Secondary | ICD-10-CM | POA: Diagnosis not present

## 2017-01-29 DIAGNOSIS — Z23 Encounter for immunization: Secondary | ICD-10-CM | POA: Diagnosis not present

## 2017-02-09 DIAGNOSIS — H2511 Age-related nuclear cataract, right eye: Secondary | ICD-10-CM | POA: Diagnosis not present

## 2017-02-10 DIAGNOSIS — H2512 Age-related nuclear cataract, left eye: Secondary | ICD-10-CM | POA: Diagnosis not present

## 2017-02-20 IMAGING — MR MR LUMBAR SPINE W/O CM
4 of 5 series · 18 of 48 positions shown · non-contrast
Comparison: MRI of the lumbar spine January 27, 2015

CLINICAL DATA: Worsening low back pain radiating to bilateral lower
extremities. No injury. History spinal stenosis.

EXAM:
MRI LUMBAR SPINE WITHOUT CONTRAST
TECHNIQUE: Multiplanar, multisequence MR imaging of the lumbar spine was
performed. No intravenous contrast was administered.

[Series 6: T2 · sagittal · 4.0mm · 0.73mm/px · 6 of 15 slices shown (1 of 2)]
[im 1/15]
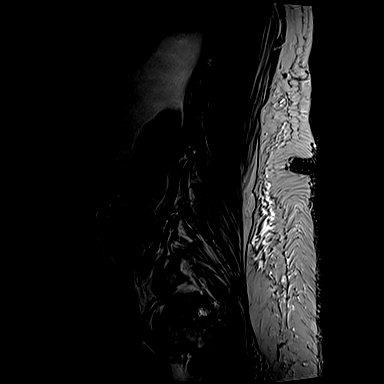
[im 3/15]
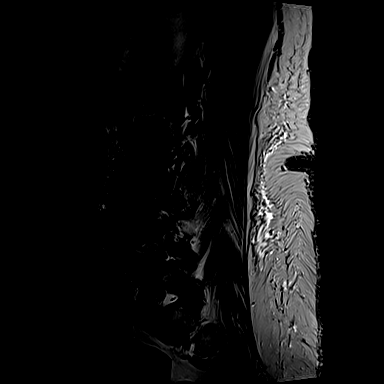
[im 6/15]
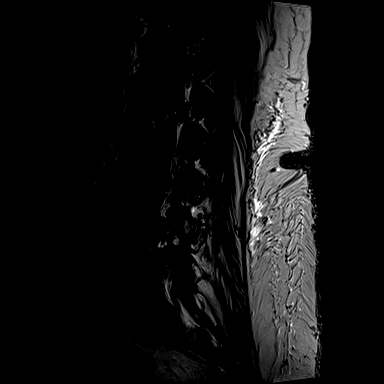
[im 9/15]
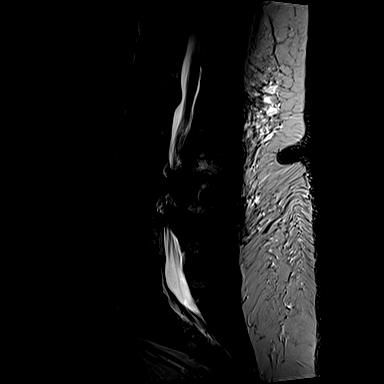
[im 12/15]
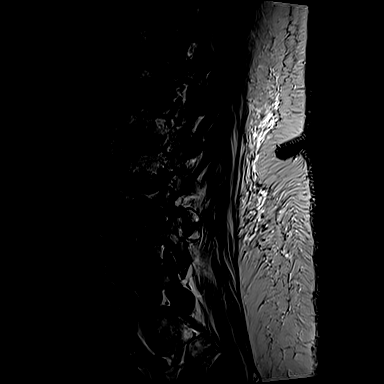
[im 15/15]
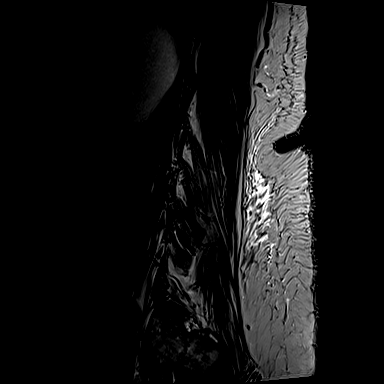

[Series 7: T1 · sagittal · 4.0mm · 0.73mm/px · 3 of 15 slices shown (1 of 2)]
[im 3/15]
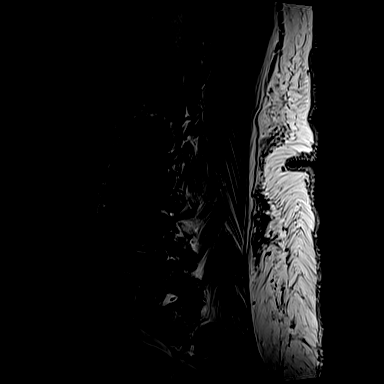
[im 9/15]
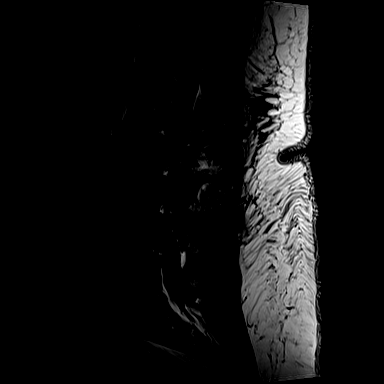
[im 15/15]
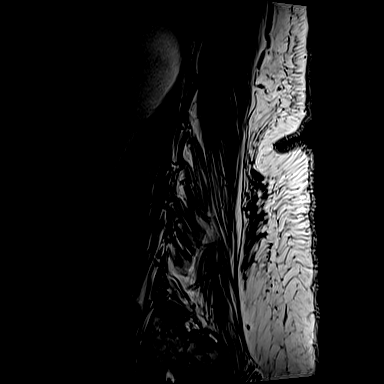

[Series 13: T2 · axial · 4.0mm · 0.28mm/px · z∈[-121,+40]mm · 6 of 37 slices shown (2 of 2)]
[im 1/37]
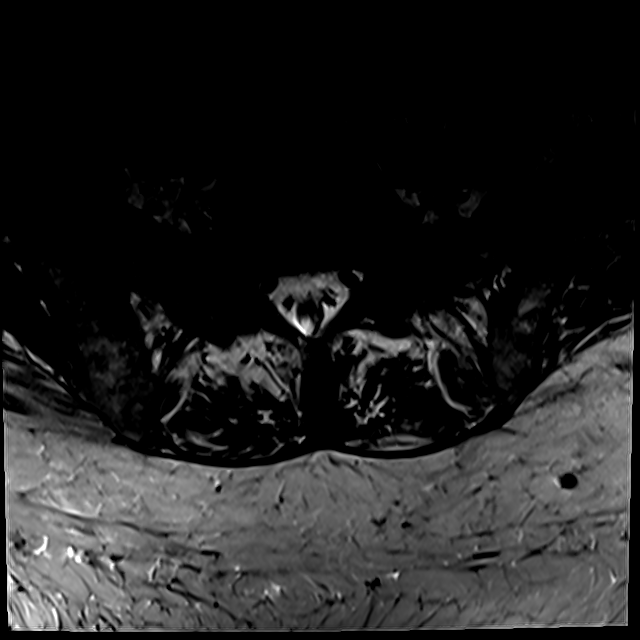
[im 6/37]
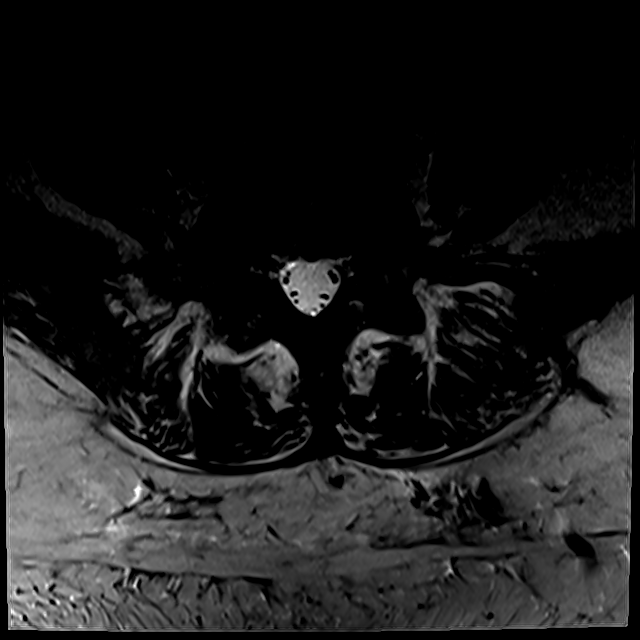
[im 11/37]
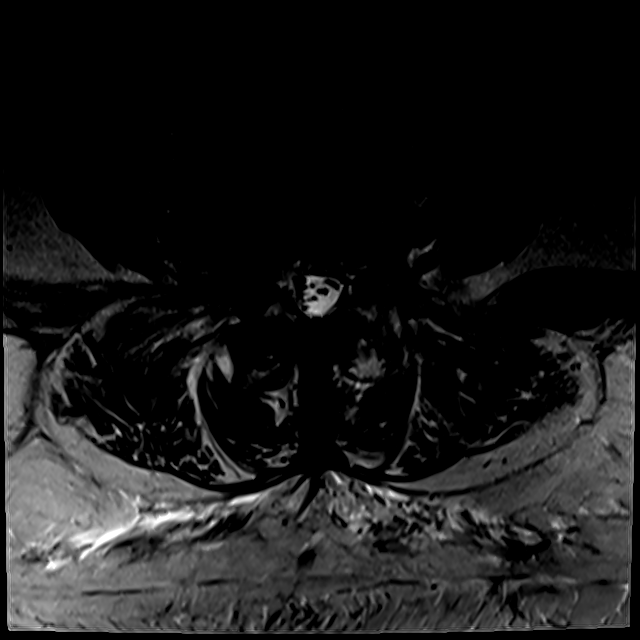
[im 16/37]
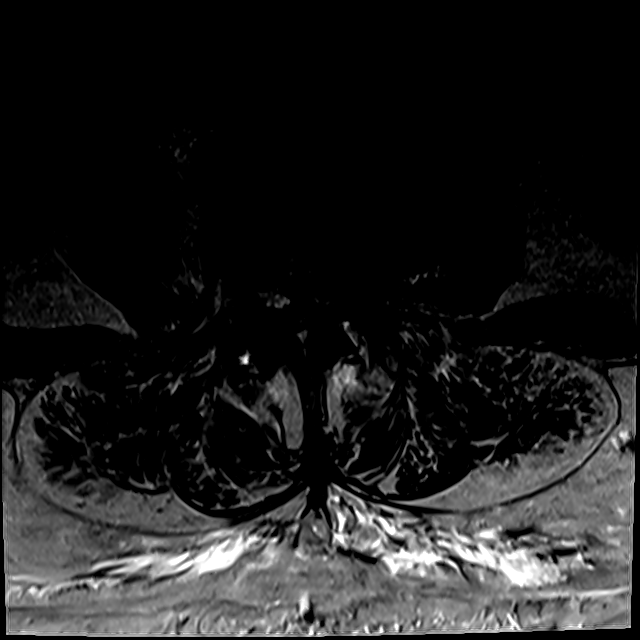
[im 19/37]
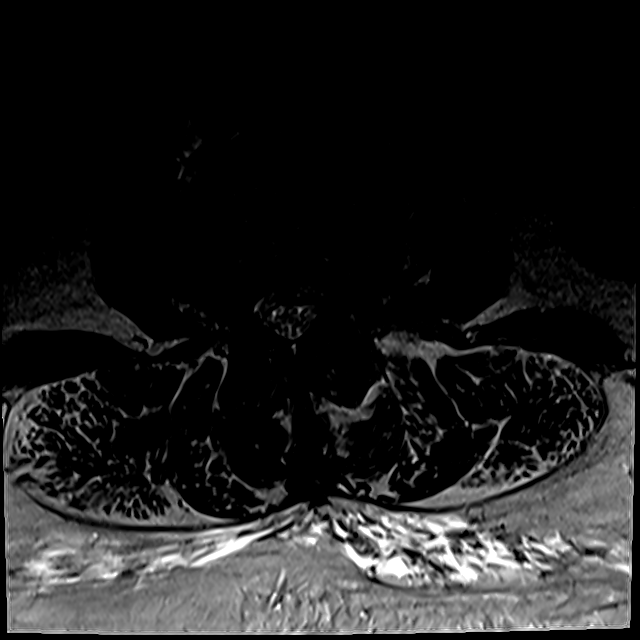
[im 31/37]
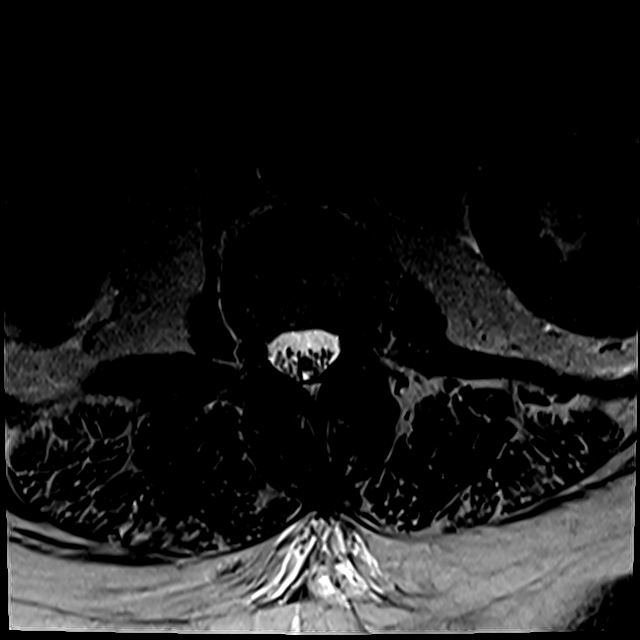

[Series 100: T1 · axial · 4.0mm · 0.28mm/px · z∈[-96,+40]mm · 3 of 37 slices shown (2 of 2)]
[im 6/37]
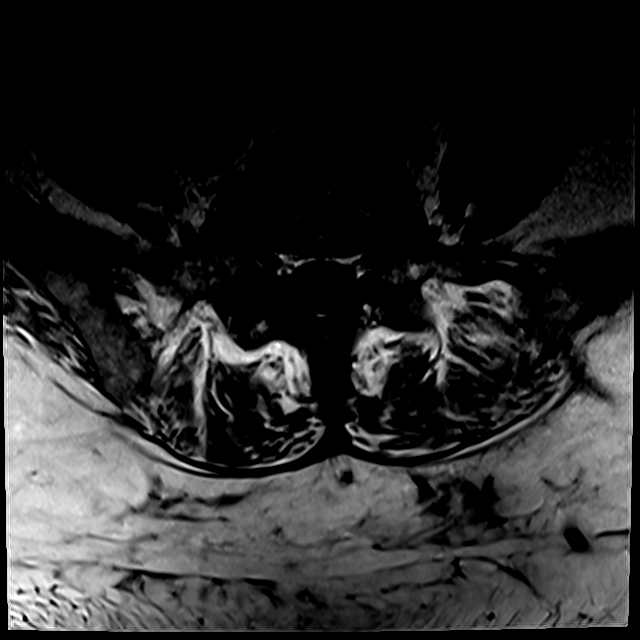
[im 19/37]
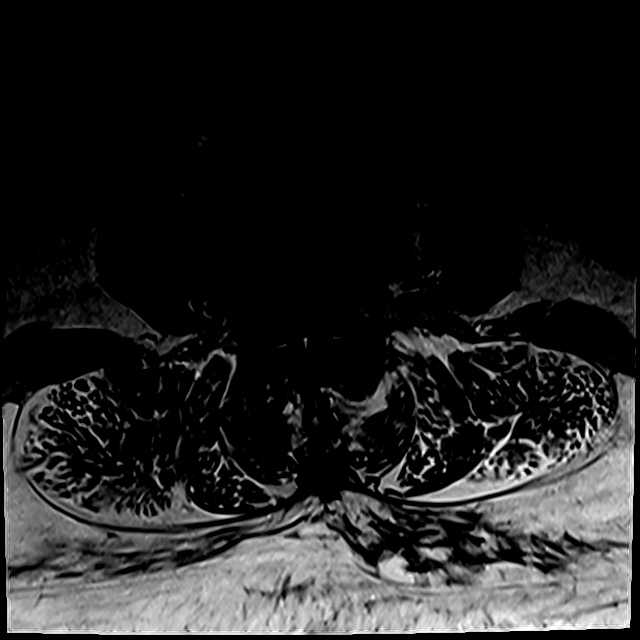
[im 31/37]
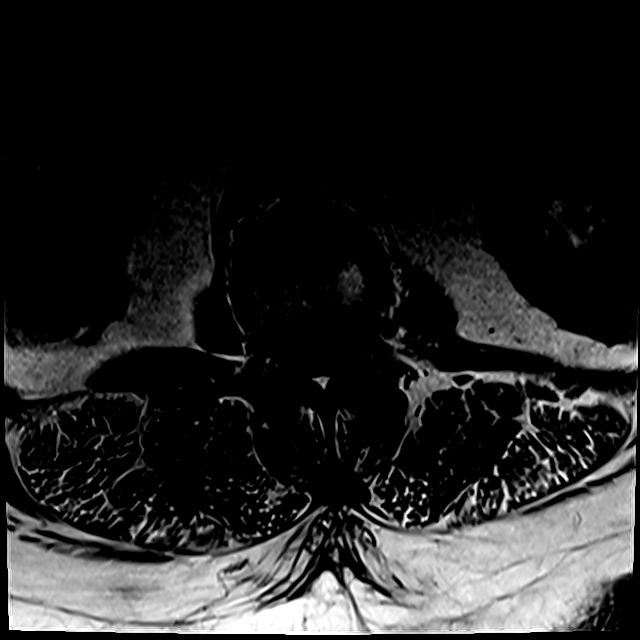

[18 of 48 positions shown; findings below may reference images not displayed]

FINDINGS: SEGMENTATION: For the purposes of this report, the last well-formed
intervertebral disc will be described as L5-S1.

ALIGNMENT: Maintenance of the lumbar lordosis. No malalignment.

VERTEBRAE:Vertebral bodies are intact. Severe L2-3, L3-4 disc height
loss, similar to prior MRI. Moderate to severe L4-5 disc narrowing
with decreased T2 signal within the discs compatible with
desiccation. Acute on chronic moderate to severe L3-4 and a lesser
extent L2-3 discogenic endplate changes. Moderate chronic L4-5
discogenic endplate changes. No suspicious bone marrow signal.

CONUS MEDULLARIS: Conus medullaris terminates at L1-2 and
demonstrates normal morphology and signal characteristics.
Disorganized cauda equina due to canal stenosis.

PARASPINAL AND SOFT TISSUES: Included prevertebral and paraspinal
soft tissues are nonsuspicious. 2.3 cm RIGHT renal cyst. Smaller
LEFT renal cyst. Mild symmetric paraspinal muscle atrophy.

DISC LEVELS:

T12-L1: Small central disc protrusion. Mild facet arthropathy and
ligamentum flavum redundancy without canal stenosis or neural
foraminal narrowing.

L1-2: Small broad-based disc bulge. Mild to moderate facet
arthropathy and ligamentum flavum redundancy. No canal stenosis.
Minimal neural foraminal narrowing.

L2-3: 6 mm broad-based disc bulge. Moderate facet arthropathy and
ligamentum flavum redundancy. Moderate to severe canal stenosis with
LEFT greater than RIGHT lateral recess stenosis which may affect the
traversing L3 nerves. Moderate RIGHT, moderate to severe LEFT neural
foraminal narrowing is similar.

L3-4: 6 mm broad-based disc bulge asymmetric to the RIGHT could
affect the exited RIGHT L3 nerve. Severe facet arthropathy and
ligamentum flavum redundancy with trace facet effusions which are
likely reactive. Severe canal stenosis, similar to prior
examination. Moderate to severe RIGHT, severe LEFT neural foraminal
narrowing.

L4-5: 5 mm broad-based disc bulge asymmetric to the RIGHT could
affect the exited RIGHT L4 nerve. Moderate to severe RIGHT, mild
LEFT facet arthropathy and ligamentum flavum redundancy. Mild canal
stenosis with partially effaced RIGHT lateral recess, which could
affect the traversing RIGHT L5 nerve. Severe RIGHT, mild LEFT neural
foraminal narrowing.

L5-S1: No disc bulge. RIGHT L5-S1 pseudoarthrosis. Moderate RIGHT
grand LEFT facet arthropathy with ligamentum flavum redundancy. No
canal stenosis. Minimal RIGHT neural foraminal narrowing.
IMPRESSION: Similar degenerative change of lumbar spine.

Severe canal stenosis L3-4, moderate to severe at L2-3 and mild at
L4-5.

Neural foraminal narrowing all lumbar levels: Severe at L3-4 and
L4-5.

## 2017-02-25 DIAGNOSIS — L578 Other skin changes due to chronic exposure to nonionizing radiation: Secondary | ICD-10-CM | POA: Diagnosis not present

## 2017-02-25 DIAGNOSIS — L814 Other melanin hyperpigmentation: Secondary | ICD-10-CM | POA: Diagnosis not present

## 2017-02-25 DIAGNOSIS — C44329 Squamous cell carcinoma of skin of other parts of face: Secondary | ICD-10-CM | POA: Diagnosis not present

## 2017-04-29 DIAGNOSIS — H25813 Combined forms of age-related cataract, bilateral: Secondary | ICD-10-CM | POA: Diagnosis not present

## 2017-05-28 ENCOUNTER — Encounter: Payer: Self-pay | Admitting: Family Medicine

## 2017-05-28 ENCOUNTER — Ambulatory Visit (INDEPENDENT_AMBULATORY_CARE_PROVIDER_SITE_OTHER): Payer: Medicare Other | Admitting: Family Medicine

## 2017-05-28 VITALS — BP 124/74 | HR 60 | Temp 97.9°F | Resp 16 | Ht 64.0 in | Wt 375.0 lb

## 2017-05-28 DIAGNOSIS — Z862 Personal history of diseases of the blood and blood-forming organs and certain disorders involving the immune mechanism: Secondary | ICD-10-CM

## 2017-05-28 DIAGNOSIS — M159 Polyosteoarthritis, unspecified: Secondary | ICD-10-CM | POA: Diagnosis not present

## 2017-05-28 DIAGNOSIS — E034 Atrophy of thyroid (acquired): Secondary | ICD-10-CM | POA: Diagnosis not present

## 2017-05-28 DIAGNOSIS — F418 Other specified anxiety disorders: Secondary | ICD-10-CM | POA: Diagnosis not present

## 2017-05-28 DIAGNOSIS — M4807 Spinal stenosis, lumbosacral region: Secondary | ICD-10-CM

## 2017-05-28 DIAGNOSIS — Z23 Encounter for immunization: Secondary | ICD-10-CM | POA: Diagnosis not present

## 2017-05-28 DIAGNOSIS — M1990 Unspecified osteoarthritis, unspecified site: Secondary | ICD-10-CM | POA: Insufficient documentation

## 2017-05-28 DIAGNOSIS — E785 Hyperlipidemia, unspecified: Secondary | ICD-10-CM

## 2017-05-28 DIAGNOSIS — I10 Essential (primary) hypertension: Secondary | ICD-10-CM

## 2017-05-28 DIAGNOSIS — M4800 Spinal stenosis, site unspecified: Secondary | ICD-10-CM | POA: Insufficient documentation

## 2017-05-28 MED ORDER — SERTRALINE HCL 100 MG PO TABS: 100.0000 mg | ORAL_TABLET | Freq: Every day | ORAL | 3 refills | Status: DC

## 2017-05-28 NOTE — Assessment & Plan Note (Signed)
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

## 2017-05-28 NOTE — Assessment & Plan Note (Signed)
Poorly controlled Patient is taking Xanax at least daily Patient also has intermittent panic attacks We will increase Zoloft to 100 mg daily with plan to possibly increase further in the future but Discussed that Wellbutrin can contribute to worsening anxiety or panic attacks, even though it does help with depression quite well We may consider decreasing her dose of this when she is stable on higher doses of Zoloft Discussed the risk of long-term benzo use including increased risk of falls confusion, respiratory depression Patient to try using Xanax only when necessary and cutting back on its My hope is that with the increase in Zoloft and better baseline control, she may be able to come off of the Xanax in the future She does not need to see a psychiatrist currently, but if her depression and anxiety proved to be difficult to control, we may consider this in the future

## 2017-05-28 NOTE — Progress Notes (Signed)
Patient: Christina Rubio, Female    DOB: 1950-07-11, 67 y.o.   MRN: [id number] Visit Date: 05/28/2017  Today's Provider: Lavon Paganini, MD   Chief Complaint  Patient presents with  . Establish Care   Subjective:    Establish Care Christina Rubio is a 67 y.o. female who presents today to establish care. She feels fairly well. She reports she is exercising. She does PT and [facility] She would like to start swimming. She reports she is sleeping poorly. She states she has difficulty falling asleep.  She states she had a colonoscopy about 10 years ago in [place]. [place] (she does not remember provider). She states her colon was "nicked" at her last colonoscopy, and does not want another colonoscopy performed. She states this was otherwise normal, and there is no family H/O colon cancer.  She also states she is due for a mammogram. No family H/O breast cancer.  She is unsure when her last pap was, and has never had a BMD.  Needs to get refills on Rxs soon, but considering switching to mail order pharmacy. Plans to call back with new pharmacy informaiton.  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 Good compliance. Denies diarrhea, constipation  HTN: Taking Valsartan-HCTZ 160-25mg  daily, Toprol XL 100mg  daily Good compliance Can tell things are "off" when misses a dose Thinks BP goes up a lot with stress  HLD: Taking Lipitor daily without side effects. Reports good compliance.  Depression/Anxiety:  Has panic attacks intermittently during stressful times (at most once weekly)- will feel as though  she cannot breathe Tries to do deep breathing Taking Xanax 1-2 times daily Does not know what her triggers are for panic attacks Taking Zoloft 50mg  daily and Wellbutrin XL 300mg  daily States that she has dark thoughts - thinks about all of the bad things that can happen in various situations Thinks she learned this from her husband that was a paranoid schizophrenic Used to see Psych in [facility].  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 Has BM several times daily. 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   HENT: Positive for tinnitus. Negative for congestion, dental problem, drooling, ear discharge, ear pain, facial swelling, hearing loss, mouth sores, nosebleeds, postnasal drip, rhinorrhea, sinus pressure, sinus pain, sneezing, sore throat, trouble swallowing and voice change.   Eyes: Negative.   Respiratory: Positive for shortness of breath. Negative for apnea, cough, choking, chest tightness, wheezing and stridor.   Cardiovascular: Positive for leg swelling. Negative for chest pain and palpitations.  Gastrointestinal: Negative.   Endocrine: Positive for cold intolerance. Negative for heat intolerance, polydipsia, polyphagia and polyuria.  Genitourinary: Positive for enuresis. Negative for decreased urine volume, difficulty urinating, dyspareunia, dysuria, flank pain, frequency, genital sores, hematuria, menstrual problem, pelvic pain, urgency, vaginal bleeding, vaginal discharge and vaginal pain.  Musculoskeletal: Positive for arthralgias, back pain, myalgias, neck pain and neck stiffness. Negative for gait problem and joint swelling.  Skin: Negative.   Allergic/Immunologic: Negative.  Neurological: Positive for dizziness, light-headedness and headaches. Negative for tremors, seizures, syncope, facial  asymmetry, speech difficulty, weakness and numbness.  Hematological: Negative.   Psychiatric/Behavioral: Negative for agitation, behavioral problems, confusion, decreased concentration, dysphoric mood, hallucinations, self-injury, sleep disturbance and suicidal ideas. The patient is nervous/anxious. The patient is not hyperactive.     Social History      She  reports that  has never smoked. she has never used smokeless tobacco. She reports that she drinks alcohol. She reports that she does not use drugs.       Social History   Socioeconomic History  . Marital status: Widowed    Spouse name: None  . Number of children: 1  . Years of education: 2 master's degrees  . Highest education level: Master's degree (e.g., MA, MS, MEng, MEd, MSW, MBA)  Social Needs  . Financial resource strain: Not hard at all  . Food insecurity - worry: Never true  . Food insecurity - inability: Never true  . Transportation needs - medical: No  . Transportation needs - non-medical: No  Occupational History  . Occupation: retired    Comment: Proofreader  Tobacco Use  . Smoking status: Never Smoker  . Smokeless tobacco: Never Used  Substance and Sexual Activity  . Alcohol use: Yes    Comment: 1-2 drinks per year  . Drug use: No  . Sexual activity: No  Other Topics Concern  . None  Social History Narrative  . None    Past Medical History:  Diagnosis Date  . Anxiety   . Arthritis   . Depression   . Hypertension   . Neuromuscular disorder ([place])   . Osteoporosis   . PONV (postoperative nausea and vomiting)   . Skin cancer    non melanoma  . Spinal stenosis   . Thyroid disease    hyperthyroid s/p partial thyroidectomy     Patient Active Problem List   Diagnosis Date Noted  . Spinal stenosis 05/28/2017  . DJD (degenerative joint disease) 05/28/2017  . Essential hypertension, benign 02/10/2016  . Dyslipidemia 02/10/2016  . Depression with anxiety 02/10/2016  . Hypothyroidism due to  acquired atrophy of thyroid 02/10/2016  . Morbid obesity ([place]) 01/09/2016    Past Surgical History:  Procedure Laterality Date  . CESAREAN SECTION    . CHOLECYSTECTOMY    . COLON SURGERY    . FEMUR IM NAIL Right 01/09/2016   Procedure: INTRAMEDULLARY (IM) RETROGRADE FEMORAL NAILING RIGHT DISTAL FEMUR;  Surgeon: Dorna Leitz, MD;  Location: [facility];  Service: Orthopedics;  Laterality: Right;  . LAPAROSCOPIC GASTRIC SLEEVE RESECTION    . Right thyroidectomy    . TONSILLECTOMY      Family History        Family Status  Relation Name Status  . Mother  Deceased  . Father  Deceased  . Daughter  (Not Specified)  . Mat Uncle  (Not Specified)  . Annamarie Major  (Not Specified)  . MGM  Deceased  . MGF  Deceased  . PGM  Deceased  . Sister  Alive  . Brother  Alive  . Sister  Alive  . PGF  Deceased  . Neg Hx  (Not Specified)        Her family history includes Anemia in her daughter; Depression in her daughter; Emphysema in her paternal grandfather; Healthy in her brother, sister, and sister; Lung cancer in her mother and paternal uncle; Prostate cancer in her father; Stroke in her maternal grandfather, maternal grandmother,  and paternal grandmother; Thyroid cancer in her maternal uncle. There is no history of Colon cancer, Breast cancer, Ovarian cancer, or Cervical cancer.      Allergies  Allergen Reactions  . Epinephrine Palpitations    faint  . Penicillins Rash    Has patient had a PCN reaction causing immediate rash, facial/tongue/throat swelling, SOB or lightheadedness with hypotension:  Yes Has patient had a PCN reaction causing severe rash involving mucus membranes or skin necrosis: No Has patient had a PCN reaction that required hospitalization: No Has patient had a PCN reaction occurring within the last 10 years: No If all of the above answers are "NO", then may proceed with Cephalosporin use.;     Current Outpatient Medications:  .  ALPRAZolam (XANAX) 0.5 MG tablet, Take one  tablet by mouth every morning; Take one tablet by mouth once daily as needed, Disp: 60 tablet, Rfl: 0 .  atorvastatin (LIPITOR) 10 MG tablet, Take 10 mg by mouth at bedtime. , Disp: , Rfl: 0 .  buPROPion (WELLBUTRIN XL) 300 MG 24 hr tablet, Take 300 mg by mouth at bedtime. , Disp: , Rfl: 0 .  levothyroxine (SYNTHROID, LEVOTHROID) 50 MCG tablet, Take 50 mcg by mouth at bedtime. , Disp: , Rfl: 0 .  metoprolol succinate (TOPROL-XL) 100 MG 24 hr tablet, Take 100 mg by mouth at bedtime. , Disp: , Rfl: 0 .  Multiple Vitamins-Minerals (PRESERVISION AREDS 2) CAPS, Take 1 tablet by mouth 2 (two) times daily., Disp: , Rfl:  .  valsartan-hydrochlorothiazide (DIOVAN-HCT) 160-25 MG tablet, Take 1 tablet by mouth at bedtime. , Disp: , Rfl: 0 .  sertraline (ZOLOFT) 100 MG tablet, Take 1 tablet (100 mg total) by mouth daily., Disp: 30 tablet, Rfl: 3   Patient Care Team: [name], MD as PCP - General (Family Medicine) Dorna Leitz, MD as Consulting Physician (Orthopedic Surgery)      Objective:   Vitals: BP 124/74 (BP Location: Right Wrist, Patient Position: Sitting, Cuff Size: Large)   Pulse 60   Temp 97.9 F (36.6 C) (Oral)   Resp 16   Ht 5\' 4"  (1.626 m)   Wt (!) 375 lb (170.1 kg)   SpO2 98%   BMI 64.37 kg/m    Vitals:   05/28/17 1009  BP: 124/74  Pulse: 60  Resp: 16  Temp: 97.9 F (36.6 C)  TempSrc: Oral  SpO2: 98%  Weight: (!) 375 lb (170.1 kg)  Height: 5\' 4"  (1.626 m)     Physical Exam  Constitutional: She is oriented to person, place, and time. She appears well-developed and well-nourished. No distress.  HENT:  Head: Normocephalic and atraumatic.  Right Ear: Tympanic membrane, external ear and ear canal normal.  Left Ear: Tympanic membrane, external ear and ear canal normal.  Nose: Nose normal.  Mouth/Throat: Uvula is midline, oropharynx is clear and moist and mucous membranes are normal. No oropharyngeal exudate or posterior oropharyngeal erythema.  Eyes:  Conjunctivae and EOM are normal. Pupils are equal, round, and reactive to light. Right eye exhibits no discharge. Left eye exhibits no discharge. No scleral icterus.  Neck: Neck supple. No thyromegaly present.  Cardiovascular: Normal rate, regular rhythm, normal heart sounds and intact distal pulses.  No murmur heard. Pulmonary/Chest: Effort normal and breath sounds normal. No respiratory distress. She has no wheezes. She has no rales.  Abdominal: Soft. She exhibits no distension. There is no tenderness.  Musculoskeletal: She exhibits no edema.  Lymphadenopathy:    She has no cervical  adenopathy.  Neurological: She is alert and oriented to person, place, and time.  Skin: Skin is warm and dry. No rash noted.  Psychiatric: She has a normal mood and affect. Her behavior is normal.  Vitals reviewed.    Depression Screen PHQ 2/9 Scores 05/28/2017  PHQ - 2 Score 2  PHQ- 9 Score 7      Assessment & Plan:    Problem List Items Addressed This Visit      Cardiovascular and Mediastinum   Essential hypertension, benign - Primary    Well-controlled Continue metoprolol and Diovan-HCT at current doses Discussed recalls of valsartan Advised patient to speak with her pharmacy to see if her lot number of her valsartan prescription was recalled or not If we need to, we could switch her to losartan HCTZ in the future Check BMP Follow-up in 3-6 months        Endocrine   Hypothyroidism due to acquired atrophy of thyroid    Patient is asymptomatic No previous TSH readings available We will check today Continue Synthroid at current dose Pending TSH results, may change dose Follow-up in 6 months, or sooner if dose needs changing      Relevant Orders   TSH     Musculoskeletal and Integument   DJD (degenerative joint disease)    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 She is followed by orthopedics at [facility] orthopedics She is receiving knee injections, epidural steroid  injections, physical therapy She states she is doing better than she has previously She is trying to avoid surgery if possible        Other   Morbid obesity ([place]) (Chronic)    Discussed importance of healthy weight for chronic conditions such as her hypertension and hyperlipidemia, as well as her joint pain and arthritis Discussed diet and exercise      Dyslipidemia    Control previously unknown Patient is tolerating Lipitor well and we will continue at current dose Recheck lipid panel and CMP      Relevant Orders   Lipid panel   Comprehensive metabolic panel   Depression with anxiety    Poorly controlled Patient is taking Xanax at least daily Patient also has intermittent panic attacks We will increase Zoloft to 100 mg daily with plan to possibly increase further in the future but Discussed that Wellbutrin can contribute to worsening anxiety or panic attacks, even though it does help with depression quite well We may consider decreasing her dose of this when she is stable on higher doses of Zoloft Discussed the risk of long-term benzo use including increased risk of falls confusion, respiratory depression Patient to try using Xanax only when necessary and cutting back on its My hope is that with the increase in Zoloft and better baseline control, she may be able to come off of the Xanax in the future She does not need to see a psychiatrist currently, but if her depression and anxiety proved to be difficult to control, we may consider this in the future      Relevant Medications   sertraline (ZOLOFT) 100 MG tablet   Spinal stenosis    As above, patient is followed by [facility] orthopedics She is tried epidural steroid injections and physical therapy and is doing better than she was previously Advised that weight loss and staying active can help with back pain       Other Visit Diagnoses    History of anemia  Relevant Orders   CBC   Need for pneumococcal vaccination        Relevant Orders   Pneumococcal conjugate vaccine 13-valent       Return in about 6 weeks (around 07/09/2017) for anxiety f/u.   The entirety of the information documented in the History of Present Illness, Review of Systems and Physical Exam were personally obtained by me. Portions of this information were initially documented by Raquel Sarna Ratchford, CMA and reviewed by me for thoroughness and accuracy.    [name], MD, MPH [facility] 05/28/2017 1:48 PM

## 2017-05-28 NOTE — Assessment & Plan Note (Signed)
As above, patient is followed by [facility] orthopedics She is tried epidural steroid injections and physical therapy and is doing better than she was previously Advised that weight loss and staying active can help with back pain

## 2017-05-28 NOTE — Patient Instructions (Signed)
Increase Zoloft to 100mg  daily (2 of the 50mg  pills together)  Keep same dose of Wellbutrin and other medicines  Only take Xanax when you feel you truly need it

## 2017-05-28 NOTE — Assessment & Plan Note (Signed)
Control previously unknown Patient is tolerating Lipitor well and we will continue at current dose Recheck lipid panel and CMP

## 2017-05-28 NOTE — Assessment & Plan Note (Signed)
Patient is asymptomatic No previous TSH readings available We will check today Continue Synthroid at current dose Pending TSH results, may change dose Follow-up in 6 months, or sooner if dose needs changing

## 2017-05-28 NOTE — Assessment & Plan Note (Signed)
Discussed importance of healthy weight for chronic conditions such as her hypertension and hyperlipidemia, as well as her joint pain and arthritis Discussed diet and exercise

## 2017-05-28 NOTE — Assessment & Plan Note (Signed)
Well-controlled Continue metoprolol and Diovan-HCT at current doses Discussed recalls of valsartan Advised patient to speak with her pharmacy to see if her lot number of her valsartan prescription was recalled or not If we need to, we could switch her to losartan HCTZ in the future Check BMP Follow-up in 3-6 months

## 2017-05-29 LAB — LIPID PANEL
CHOLESTEROL TOTAL: 180 mg/dL (ref 100–199)
Chol/HDL Ratio: 3.3 ratio (ref 0.0–4.4)
HDL: 55 mg/dL (ref >39)
LDL CALC: 96 mg/dL (ref 0–99)
TRIGLYCERIDES: 143 mg/dL (ref 0–149)
VLDL Cholesterol Cal: 29 mg/dL (ref 5–40)

## 2017-05-29 LAB — COMPREHENSIVE METABOLIC PANEL
A/G RATIO: 1.5 (ref 1.2–2.2)
ALBUMIN: 4.5 g/dL (ref 3.6–4.8)
ALT: 17 IU/L (ref 0–32)
AST: 20 IU/L (ref 0–40)
Alkaline Phosphatase: 49 IU/L (ref 39–117)
BUN / CREAT RATIO: 15 (ref 12–28)
BUN: 15 mg/dL (ref 8–27)
Bilirubin Total: 0.6 mg/dL (ref 0.0–1.2)
CALCIUM: 9.6 mg/dL (ref 8.7–10.3)
CO2: 22 mmol/L (ref 20–29)
CREATININE: 0.97 mg/dL (ref 0.57–1.00)
Chloride: 100 mmol/L (ref 96–106)
GFR, EST AFRICAN AMERICAN: 71 mL/min/{1.73_m2} (ref >59)
GFR, EST NON AFRICAN AMERICAN: 61 mL/min/{1.73_m2} (ref >59)
GLOBULIN, TOTAL: 3 g/dL (ref 1.5–4.5)
Glucose: 107 mg/dL — ABNORMAL HIGH (ref 65–99)
POTASSIUM: 3.8 mmol/L (ref 3.5–5.2)
SODIUM: 139 mmol/L (ref 134–144)
TOTAL PROTEIN: 7.5 g/dL (ref 6.0–8.5)

## 2017-05-29 LAB — CBC
Hematocrit: 41.5 % (ref 34.0–46.6)
Hemoglobin: 14 g/dL (ref 11.1–15.9)
MCH: 29.8 pg (ref 26.6–33.0)
MCHC: 33.7 g/dL (ref 31.5–35.7)
MCV: 88 fL (ref 79–97)
PLATELETS: 266 10*3/uL (ref 150–379)
RBC: 4.7 x10E6/uL (ref 3.77–5.28)
RDW: 14 % (ref 12.3–15.4)
WBC: 8.4 10*3/uL (ref 3.4–10.8)

## 2017-05-29 LAB — TSH: TSH: 4.43 u[IU]/mL (ref 0.450–4.500)

## 2017-06-23 ENCOUNTER — Other Ambulatory Visit: Payer: Self-pay | Admitting: Family Medicine

## 2017-06-23 MED ORDER — ALPRAZOLAM 0.5 MG PO TABS: 0.5000 mg | ORAL_TABLET | Freq: Every day | ORAL | 0 refills | Status: DC | PRN

## 2017-06-23 NOTE — Telephone Encounter (Signed)
Pt needs refill on her generic xanax .5  She uses [facility] [address].  Pt's call back is [phone]  Thanks teri

## 2017-06-23 NOTE — Telephone Encounter (Signed)
LOV 05/28/2017. Established care at that time.

## 2017-06-29 ENCOUNTER — Other Ambulatory Visit: Payer: Self-pay | Admitting: Family Medicine

## 2017-06-29 DIAGNOSIS — F418 Other specified anxiety disorders: Secondary | ICD-10-CM

## 2017-06-29 MED ORDER — SERTRALINE HCL 100 MG PO TABS: 100.0000 mg | ORAL_TABLET | Freq: Every day | ORAL | 3 refills | Status: DC

## 2017-06-29 NOTE — Telephone Encounter (Signed)
Pt established care on 05/28/2017.

## 2017-06-29 NOTE — Telephone Encounter (Signed)
Patient is requesting a refill on the following medication  sertraline (ZOLOFT) 100 MG tablet  She uses [facility] on  [address].

## 2017-07-14 ENCOUNTER — Ambulatory Visit: Payer: Self-pay | Admitting: Family Medicine

## 2017-07-23 ENCOUNTER — Ambulatory Visit: Payer: Medicare Other | Admitting: Family Medicine

## 2017-07-28 ENCOUNTER — Ambulatory Visit (INDEPENDENT_AMBULATORY_CARE_PROVIDER_SITE_OTHER): Payer: Medicare Other | Admitting: Family Medicine

## 2017-07-28 ENCOUNTER — Ambulatory Visit: Payer: Medicare Other | Admitting: Family Medicine

## 2017-07-28 ENCOUNTER — Encounter: Payer: Self-pay | Admitting: Family Medicine

## 2017-07-28 VITALS — BP 128/80 | HR 63 | Temp 97.5°F | Resp 20 | Wt 386.0 lb

## 2017-07-28 DIAGNOSIS — I878 Other specified disorders of veins: Secondary | ICD-10-CM | POA: Diagnosis not present

## 2017-07-28 DIAGNOSIS — F418 Other specified anxiety disorders: Secondary | ICD-10-CM

## 2017-07-28 MED ORDER — ALPRAZOLAM 0.5 MG PO TABS: 0.5000 mg | ORAL_TABLET | Freq: Every day | ORAL | 0 refills | Status: DC | PRN

## 2017-07-28 MED ORDER — SERTRALINE HCL 100 MG PO TABS: 150.0000 mg | ORAL_TABLET | Freq: Every day | ORAL | 3 refills | Status: DC

## 2017-07-28 NOTE — Progress Notes (Signed)
Patient: Christina Rubio Female    DOB: May 12, 1950   67 y.o.   MRN: [id number] Visit Date: 07/29/2017  Today's Provider: Lavon Paganini, MD   Chief Complaint  Patient presents with  . Anxiety   Subjective:    Anxiety  Presents for follow-up (LOV 05/28/2017. Zoloft was increased to 100 mg in hopes of eventually D/C Wellbutrin to improve anxiety. Pt was advised to take Xanax sparingly.) visit. Symptoms include dry mouth, excessive worry, irritability, nervous/anxious behavior, panic (occasionally) and shortness of breath (with panic). Patient reports no chest pain, compulsions, confusion, decreased concentration, depressed mood (improving), dizziness, feeling of choking, hyperventilation, insomnia, muscle tension, obsessions, palpitations, restlessness or suicidal ideas. The severity of symptoms is moderate. The quality of sleep is good.    Pt states this is somewhat improved. She is taking her Alprazolam less than once daily.  She states that she still has 8 pills left of the 30 that she filled 1 month ago.  Patient is also concerned about blood flow to her feet.  She states that she has had significantly dry skin on her feet and no rales for several years.  She also has discoloration that is now dark in color over bilateral feet.  She states that she has some tingling in her toes bilaterally that does not impair her ability to feel things when she steps on them.  She previously saw a podiatrist who cleaned up some calluses and gave her a cream to put on her feet that has not helped.  She does not know the name of this cream.     Depression screen [facility] 2/9 07/28/2017 05/28/2017  Decreased Interest 1 1  Down, Depressed, Hopeless 0 1  PHQ - 2 Score 1 2  Altered sleeping 1 2  Tired, decreased energy 1 2  Change in appetite 3 1  Feeling bad or failure about yourself  0 0  Trouble concentrating 0 0  Moving slowly or fidgety/restless 0 0  Suicidal thoughts 0 0  PHQ-9 Score 6 7  Difficult  doing work/chores Somewhat difficult Very difficult    GAD 7 : Generalized Anxiety Score 07/28/2017  Nervous, Anxious, on Edge 0  Control/stop worrying 2  Worry too much - different things 2  Trouble relaxing 1  Restless 0  Easily annoyed or irritable 1  Afraid - awful might happen 2  Total GAD 7 Score 8  Anxiety Difficulty Somewhat difficult       Allergies  Allergen Reactions  . Epinephrine Palpitations    faint  . Penicillins Rash    Has patient had a PCN reaction causing immediate rash, facial/tongue/throat swelling, SOB or lightheadedness with hypotension:  Yes Has patient had a PCN reaction causing severe rash involving mucus membranes or skin necrosis: No Has patient had a PCN reaction that required hospitalization: No Has patient had a PCN reaction occurring within the last 10 years: No If all of the above answers are "NO", then may proceed with Cephalosporin use.;  . Strawberry Extract Rash     Current Outpatient Medications:  .  ALPRAZolam (XANAX) 0.5 MG tablet, Take 1 tablet (0.5 mg total) by mouth daily as needed for anxiety., Disp: 30 tablet, Rfl: 0 .  atorvastatin (LIPITOR) 10 MG tablet, Take 10 mg by mouth at bedtime. , Disp: , Rfl: 0 .  buPROPion (WELLBUTRIN XL) 300 MG 24 hr tablet, Take 300 mg by mouth at bedtime. , Disp: , Rfl: 0 .  levothyroxine (SYNTHROID, LEVOTHROID) 50  MCG tablet, Take 50 mcg by mouth at bedtime. , Disp: , Rfl: 0 .  metoprolol succinate (TOPROL-XL) 100 MG 24 hr tablet, Take 100 mg by mouth at bedtime. , Disp: , Rfl: 0 .  Multiple Vitamins-Minerals (PRESERVISION AREDS 2) CAPS, Take 1 tablet by mouth 2 (two) times daily., Disp: , Rfl:  .  sertraline (ZOLOFT) 100 MG tablet, Take 1.5 tablets (150 mg total) by mouth daily., Disp: 135 tablet, Rfl: 3 .  valsartan-hydrochlorothiazide (DIOVAN-HCT) 160-25 MG tablet, Take 1 tablet by mouth at bedtime. , Disp: , Rfl: 0  Review of Systems  Constitutional: Positive for irritability.  Respiratory:  Positive for shortness of breath (with panic).   Cardiovascular: Negative for chest pain and palpitations.  Neurological: Negative for dizziness.  Psychiatric/Behavioral: Negative for confusion, decreased concentration and suicidal ideas. The patient is nervous/anxious. The patient does not have insomnia.     Social History   Tobacco Use  . Smoking status: Never Smoker  . Smokeless tobacco: Never Used  Substance Use Topics  . Alcohol use: Yes    Comment: 1-2 drinks per year   Objective:   BP 128/80 (BP Location: Left Wrist, Patient Position: Sitting, Cuff Size: Large)   Pulse 63   Temp (!) 97.5 F (36.4 C) (Oral)   Resp 20   Wt (!) 386 lb (175.1 kg)   SpO2 98%   BMI 66.26 kg/m  Vitals:   07/28/17 1114  BP: 128/80  Pulse: 63  Resp: 20  Temp: (!) 97.5 F (36.4 C)  TempSrc: Oral  SpO2: 98%  Weight: (!) 386 lb (175.1 kg)     Physical Exam  Constitutional: She is oriented to person, place, and time. She appears well-developed and well-nourished.  HENT:  Head: Normocephalic and atraumatic.  Eyes: Conjunctivae are normal. No scleral icterus.  Neck: Neck supple. No thyromegaly present.  Cardiovascular: Normal rate, regular rhythm and normal heart sounds.  No murmur heard. Pulses:      Dorsalis pedis pulses are 1+ on the right side, and 1+ on the left side.       Posterior tibial pulses are 1+ on the right side, and 1+ on the left side.  Pulmonary/Chest: Effort normal and breath sounds normal. No respiratory distress. She has no wheezes. She has no rales.  Musculoskeletal: She exhibits edema. She exhibits no deformity.  Lymphadenopathy:    She has no cervical adenopathy.  Neurological: She is alert and oriented to person, place, and time.  Skin: Skin is warm and dry. Capillary refill takes 2 to 3 seconds.  Venous stasis dermatitis over lower legs Hyperpigmentation of toes and feet bilaterally Foot is similar temperature to rest of body  Psychiatric: She has a normal  mood and affect. Her behavior is normal.  Vitals reviewed.         Assessment & Plan:   Problem List Items Addressed This Visit      Other   Depression with anxiety - Primary    Improving, but not controlled She is having less panic attacks and needing Xanax less often Zoloft is helping, so we will increase to 150 mg daily Continue Wellbutrin at this time, but could consider in the future whether this is contributing to her increased anxiety Discussed risk of long-term benzo use including increased risk of falls, confusion, respiratory depression She is trying to cut back on her Xanax, which was refilled today We will plan to slowly taper off in the future      Relevant Medications  sertraline (ZOLOFT) 100 MG tablet   ALPRAZolam (XANAX) 0.5 MG tablet   Venous stasis    Patient with morbid obesity and history of spinal stenosis with decreased but present pulses, venous stasis changes to bilateral feet She would benefit from evaluation from vascular surgery, so she has been referred today Discussed importance of elevation in minimizing edema which is contributing to her problem      Relevant Orders   Ambulatory referral to Vascular Surgery       Return in about 3 months (around 10/28/2017) for anxiety f/u.   The entirety of the information documented in the History of Present Illness, Review of Systems and Physical Exam were personally obtained by me. Portions of this information were initially documented by Raquel Sarna Ratchford, CMA and reviewed by me for thoroughness and accuracy.    [name], MD, MPH [facility] 07/29/2017 10:06 AM

## 2017-07-29 NOTE — Assessment & Plan Note (Signed)
Patient with morbid obesity and history of spinal stenosis with decreased but present pulses, venous stasis changes to bilateral feet She would benefit from evaluation from vascular surgery, so she has been referred today Discussed importance of elevation in minimizing edema which is contributing to her problem

## 2017-07-29 NOTE — Assessment & Plan Note (Signed)
Improving, but not controlled She is having less panic attacks and needing Xanax less often Zoloft is helping, so we will increase to 150 mg daily Continue Wellbutrin at this time, but could consider in the future whether this is contributing to her increased anxiety Discussed risk of long-term benzo use including increased risk of falls, confusion, respiratory depression She is trying to cut back on her Xanax, which was refilled today We will plan to slowly taper off in the future

## 2017-08-05 ENCOUNTER — Other Ambulatory Visit: Payer: Self-pay

## 2017-08-05 MED ORDER — LEVOTHYROXINE SODIUM 50 MCG PO TABS
50.0000 ug | ORAL_TABLET | Freq: Every day | ORAL | 3 refills | Status: DC
Start: 2017-08-05 — End: 2018-05-21

## 2017-08-05 MED ORDER — ATORVASTATIN CALCIUM 10 MG PO TABS
10.0000 mg | ORAL_TABLET | Freq: Every day | ORAL | 3 refills | Status: DC
Start: 2017-08-05 — End: 2018-05-21

## 2017-08-05 MED ORDER — METOPROLOL SUCCINATE ER 100 MG PO TB24: 100.0000 mg | ORAL_TABLET | Freq: Every day | ORAL | 3 refills | Status: DC

## 2017-08-05 MED ORDER — VALSARTAN-HYDROCHLOROTHIAZIDE 160-25 MG PO TABS: 1.0000 | ORAL_TABLET | Freq: Every day | ORAL | 3 refills | Status: DC

## 2017-08-05 NOTE — Telephone Encounter (Signed)
Patient called requesting a refill on her medications. [facility] on [address]. Thanks!

## 2017-08-10 ENCOUNTER — Telehealth: Payer: Self-pay | Admitting: Family Medicine

## 2017-08-10 MED ORDER — BUPROPION HCL ER (XL) 300 MG PO TB24: 300.0000 mg | ORAL_TABLET | Freq: Every day | ORAL | 3 refills | Status: DC

## 2017-08-10 NOTE — Telephone Encounter (Signed)
Please review

## 2017-08-10 NOTE — Telephone Encounter (Signed)
Pt stated that she recently became a pt of Dr. Sharmaine Base and she thought Dr. B was going to take over her  buPROPion (WELLBUTRIN XL) 300 MG 24 hr tablet but it wasn't sent to the pharmacy. Pt is requesting Rx for buPROPion (WELLBUTRIN XL) 300 MG 24 hr tablet be sent to [facility]. Please advise. Thanks TNP

## 2017-08-10 NOTE — Telephone Encounter (Signed)
Rx sent  [name], MD, MPH [facility] 08/10/2017 2:20 PM

## 2017-08-10 NOTE — Telephone Encounter (Signed)
Pt advised.

## 2017-10-28 ENCOUNTER — Ambulatory Visit: Payer: Self-pay | Admitting: Family Medicine

## 2017-11-03 ENCOUNTER — Ambulatory Visit: Payer: Medicare Other | Admitting: Family Medicine

## 2017-11-03 NOTE — Progress Notes (Deleted)
     Patient: Christina Rubio Female    DOB: [birth year]   67 y.o.   MRN: [id number] Visit Date: 11/03/2017  Today's Provider: Lavon Paganini, MD   No chief complaint on file.  Subjective:    HPI  Anxiety: Patient presents for a 3 month follow up. Last OV was on 07/28/17. Patient reports she was having less panic attacks and needing Xanax less often. Zoloft was increased to 150 mg daily. Advised to continue Wellbutrin at this time. She reports good compliance with treatment plan. Symptoms are     Allergies  Allergen Reactions  . Epinephrine Palpitations    faint  . Penicillins Rash    Has patient had a PCN reaction causing immediate rash, facial/tongue/throat swelling, SOB or lightheadedness with hypotension:  Yes Has patient had a PCN reaction causing severe rash involving mucus membranes or skin necrosis: No Has patient had a PCN reaction that required hospitalization: No Has patient had a PCN reaction occurring within the last 10 years: No If all of the above answers are "NO", then may proceed with Cephalosporin use.;  . Strawberry Extract Rash     Current Outpatient Medications:  .  ALPRAZolam (XANAX) 0.5 MG tablet, Take 1 tablet (0.5 mg total) by mouth daily as needed for anxiety., Disp: 30 tablet, Rfl: 0 .  atorvastatin (LIPITOR) 10 MG tablet, Take 1 tablet (10 mg total) by mouth at bedtime., Disp: 90 tablet, Rfl: 3 .  buPROPion (WELLBUTRIN XL) 300 MG 24 hr tablet, Take 1 tablet (300 mg total) by mouth at bedtime., Disp: 30 tablet, Rfl: 3 .  levothyroxine (SYNTHROID, LEVOTHROID) 50 MCG tablet, Take 1 tablet (50 mcg total) by mouth at bedtime., Disp: 90 tablet, Rfl: 3 .  metoprolol succinate (TOPROL-XL) 100 MG 24 hr tablet, Take 1 tablet (100 mg total) by mouth at bedtime., Disp: 90 tablet, Rfl: 3 .  Multiple Vitamins-Minerals (PRESERVISION AREDS 2) CAPS, Take 1 tablet by mouth 2 (two) times daily., Disp: , Rfl:  .  sertraline (ZOLOFT) 100 MG tablet, Take 1.5 tablets (150 mg  total) by mouth daily., Disp: 135 tablet, Rfl: 3 .  valsartan-hydrochlorothiazide (DIOVAN-HCT) 160-25 MG tablet, Take 1 tablet by mouth at bedtime., Disp: 90 tablet, Rfl: 3   Review of Systems  Constitutional: Negative.   Respiratory: Negative.   Cardiovascular: Negative.   Neurological: Negative.   Psychiatric/Behavioral: The patient is nervous/anxious.        Irritability      Social History   Tobacco Use  . Smoking status: Never Smoker  . Smokeless tobacco: Never Used  Substance Use Topics  . Alcohol use: Yes    Comment: 1-2 drinks per year   Objective:   There were no vitals taken for this visit.   Physical Exam      Assessment & Plan:           Lavon Paganini, MD  [facility]

## 2017-11-10 ENCOUNTER — Other Ambulatory Visit: Payer: Self-pay | Admitting: Family Medicine

## 2017-11-17 ENCOUNTER — Encounter: Payer: Self-pay | Admitting: Family Medicine

## 2017-11-17 ENCOUNTER — Ambulatory Visit (INDEPENDENT_AMBULATORY_CARE_PROVIDER_SITE_OTHER): Payer: Medicare Other | Admitting: Family Medicine

## 2017-11-17 VITALS — BP 126/78 | HR 51 | Temp 97.9°F | Wt 397.0 lb

## 2017-11-17 DIAGNOSIS — Z23 Encounter for immunization: Secondary | ICD-10-CM

## 2017-11-17 DIAGNOSIS — F418 Other specified anxiety disorders: Secondary | ICD-10-CM

## 2017-11-17 DIAGNOSIS — Z1211 Encounter for screening for malignant neoplasm of colon: Secondary | ICD-10-CM | POA: Diagnosis not present

## 2017-11-17 DIAGNOSIS — Z1231 Encounter for screening mammogram for malignant neoplasm of breast: Secondary | ICD-10-CM

## 2017-11-17 NOTE — Progress Notes (Signed)
Patient: Christina Rubio Female    DOB: [birth year]   67 y.o.   MRN: [id number] Visit Date: 11/18/2017  Today's Provider: Lavon Paganini, MD   Chief Complaint  Patient presents with  . Anxiety   Subjective:    I, Tiburcio Pea, CMA, am acting as a scribe for Lavon Paganini, MD.   HPI Anxiety:  Patient presents for a 3 month follow up. Last OV was on 07/28/17. Patient advised to increase Zoloft to 150 mg daily and use Xanax less often. She reports good compliance with treatment plan. Patient states symptoms are worse. She states there are days she does not feel like getting out of bed. She started back taking Xanax on Friday, and symptoms are a little better today.  Depression screen [facility] 2/9 11/17/2017 07/28/2017 05/28/2017  Decreased Interest 2 1 1   Down, Depressed, Hopeless 3 0 1  PHQ - 2 Score 5 1 2   Altered sleeping 3 1 2   Tired, decreased energy 2 1 2   Change in appetite 2 3 1   Feeling bad or failure about yourself  1 0 0  Trouble concentrating 1 0 0  Moving slowly or fidgety/restless 1 0 0  Suicidal thoughts 0 0 0  PHQ-9 Score 15 6 7   Difficult doing work/chores Very difficult Somewhat difficult Very difficult     GAD 7 : Generalized Anxiety Score 11/17/2017 07/28/2017  Nervous, Anxious, on Edge 2 0  Control/stop worrying 3 2  Worry too much - different things 2 2  Trouble relaxing 3 1  Restless 1 0  Easily annoyed or irritable 2 1  Afraid - awful might happen 2 2  Total GAD 7 Score 15 8  Anxiety Difficulty Very difficult Somewhat difficult         Allergies  Allergen Reactions  . Epinephrine Palpitations    faint  . Penicillins Rash    Has patient had a PCN reaction causing immediate rash, facial/tongue/throat swelling, SOB or lightheadedness with hypotension:  Yes Has patient had a PCN reaction causing severe rash involving mucus membranes or skin necrosis: No Has patient had a PCN reaction that required hospitalization: No Has patient had a PCN reaction  occurring within the last 10 years: No If all of the above answers are "NO", then may proceed with Cephalosporin use.;  . Strawberry Extract Rash     Current Outpatient Medications:  .  ALPRAZolam (XANAX) 0.5 MG tablet, TAKE 1 TABLET BY MOUTH DAILY AS NEEDED FOR ANXIETY, Disp: 30 tablet, Rfl: 0 .  atorvastatin (LIPITOR) 10 MG tablet, Take 1 tablet (10 mg total) by mouth at bedtime., Disp: 90 tablet, Rfl: 3 .  buPROPion (WELLBUTRIN XL) 300 MG 24 hr tablet, Take 1 tablet (300 mg total) by mouth at bedtime., Disp: 30 tablet, Rfl: 3 .  levothyroxine (SYNTHROID, LEVOTHROID) 50 MCG tablet, Take 1 tablet (50 mcg total) by mouth at bedtime., Disp: 90 tablet, Rfl: 3 .  metoprolol succinate (TOPROL-XL) 100 MG 24 hr tablet, Take 1 tablet (100 mg total) by mouth at bedtime., Disp: 90 tablet, Rfl: 3 .  Multiple Vitamins-Minerals (PRESERVISION AREDS 2) CAPS, Take 1 tablet by mouth 2 (two) times daily., Disp: , Rfl:  .  sertraline (ZOLOFT) 100 MG tablet, Take 1.5 tablets (150 mg total) by mouth daily., Disp: 135 tablet, Rfl: 3 .  valsartan-hydrochlorothiazide (DIOVAN-HCT) 160-25 MG tablet, Take 1 tablet by mouth at bedtime., Disp: 90 tablet, Rfl: 3   Review of Systems  Constitutional: Positive for activity change.  Respiratory: Negative.   Cardiovascular: Negative.   Endocrine: Negative.   Psychiatric/Behavioral: The patient is nervous/anxious.     Social History   Tobacco Use  . Smoking status: Never Smoker  . Smokeless tobacco: Never Used  Substance Use Topics  . Alcohol use: Yes    Comment: 1-2 drinks per year   Objective:   BP 126/78 (BP Location: Right Arm, Patient Position: Sitting, Cuff Size: Large)   Pulse (!) 51   Temp 97.9 F (36.6 C) (Oral)   Wt (!) 397 lb (180.1 kg)   SpO2 96%   BMI 68.14 kg/m  Vitals:   11/17/17 1133  BP: 126/78  Pulse: (!) 51  Temp: 97.9 F (36.6 C)  TempSrc: Oral  SpO2: 96%  Weight: (!) 397 lb (180.1 kg)     Physical Exam  Constitutional: She  is oriented to person, place, and time. She appears well-developed and well-nourished. No distress.  HENT:  Head: Normocephalic and atraumatic.  Eyes: Conjunctivae are normal. No scleral icterus.  Neck: Neck supple. No thyromegaly present.  Cardiovascular: Normal rate, regular rhythm, normal heart sounds and intact distal pulses.  No murmur heard. Pulmonary/Chest: Effort normal and breath sounds normal. No respiratory distress. She has no wheezes. She has no rales.  Musculoskeletal: She exhibits no edema.  Lymphadenopathy:    She has no cervical adenopathy.  Neurological: She is alert and oriented to person, place, and time.  Skin: Skin is warm and dry. Capillary refill takes less than 2 seconds. No rash noted.  Psychiatric: Her speech is normal and behavior is normal. Thought content normal. Her mood appears anxious. Her affect is blunt and labile. Cognition and memory are normal. She exhibits a depressed mood. She expresses no homicidal and no suicidal ideation. She expresses no suicidal plans and no homicidal plans.  Vitals reviewed.      Assessment & Plan:   Problem List Items Addressed This Visit      Other   Depression with anxiety - Primary    Worsening, uncontrolled Patient is interested in therapy - we discussed synergistic effects of this with medications -referral placed and also given information to look into a different therapist on her own Continue Zoloft, Wellbutrin, and Xanax at current doses Patient to continue to take Xanax only prn We have discussed the risks of long-term benzo use      Relevant Orders   Ambulatory referral to Psychology    Other Visit Diagnoses    Screen for colon cancer       Relevant Orders   Cologuard   Screening mammogram, encounter for       Relevant Orders   MM 3D SCREEN BREAST BILATERAL   Need for shingles vaccine       Relevant Orders   Varicella-zoster vaccine IM (Shingrix) (Completed)       Return in about 4 weeks (around  12/15/2017) for AWV/CPE.   The entirety of the information documented in the History of Present Illness, Review of Systems and Physical Exam were personally obtained by me. Portions of this information were initially documented by Tiburcio Pea, CMA and reviewed by me for thoroughness and accuracy.    [name], MD, MPH [facility] 11/18/2017 10:25 AM

## 2017-11-17 NOTE — Patient Instructions (Signed)
[facility] in [place]  Also look into other psychologists on [email or web address]

## 2017-11-17 NOTE — Assessment & Plan Note (Addendum)
Worsening, uncontrolled Patient is interested in therapy - we discussed synergistic effects of this with medications -referral placed and also given information to look into a different therapist on her own Continue Zoloft, Wellbutrin, and Xanax at current doses Patient to continue to take Xanax only prn We have discussed the risks of long-term benzo use

## 2017-12-25 ENCOUNTER — Other Ambulatory Visit: Payer: Self-pay | Admitting: Family Medicine

## 2017-12-25 NOTE — Telephone Encounter (Signed)
Pt called saying she is not going to see the therapist until Nov 5th.  She will need the medication filled before then  Bupropion 300 mg 24 hr  [facility] [address] [employer] [address]  CB# [phone]  Thanks [employer]

## 2017-12-30 ENCOUNTER — Ambulatory Visit: Payer: Medicare Other | Admitting: Psychiatry

## 2018-01-13 ENCOUNTER — Ambulatory Visit: Payer: Medicare Other | Admitting: Psychology

## 2018-01-14 DIAGNOSIS — F4320 Adjustment disorder, unspecified: Secondary | ICD-10-CM | POA: Diagnosis not present

## 2018-01-19 ENCOUNTER — Telehealth: Payer: Self-pay

## 2018-01-19 NOTE — Telephone Encounter (Signed)
LMTCB

## 2018-01-19 NOTE — Telephone Encounter (Signed)
-----   Message from [name], MD sent at 01/18/2018  3:26 PM EDT ----- Can you encourage the patient to complete the Cologuard and send it back?   ----- Message ----- From: Truitt Merle Sent: 01/18/2018   1:37 PM EDT To: [name], MD  Review incoming fax

## 2018-01-22 NOTE — Telephone Encounter (Signed)
Patient states she has been sick but plans to complete sample and mail back this upcoming week. Patient also states she did see psychologist and also has a follow up appointment next week.

## 2018-01-22 NOTE — Telephone Encounter (Signed)
LMTCB 01/22/2018  Thanks,   -Tyerra Loretto  

## 2018-01-26 ENCOUNTER — Ambulatory Visit: Payer: Medicare Other

## 2018-01-26 ENCOUNTER — Telehealth: Payer: Self-pay | Admitting: Family Medicine

## 2018-01-26 ENCOUNTER — Encounter: Payer: Medicare Other | Admitting: Family Medicine

## 2018-01-26 DIAGNOSIS — F4320 Adjustment disorder, unspecified: Secondary | ICD-10-CM | POA: Diagnosis not present

## 2018-01-26 NOTE — Telephone Encounter (Signed)
Pt had to reschedule her appt that was supposed to be today for shingrex. Pt is asking should she come in sooner that 04/15/18 when her appt was rescheduled. Please advise. Thanks TNP

## 2018-01-27 NOTE — Telephone Encounter (Signed)
Pt needing a call back to discuss 2nd shingles shot.  Please call pt back asap at [phone].  Thanks, [employer]

## 2018-01-27 NOTE — Telephone Encounter (Signed)
Can fit her in wherever for that as long as it is 2 months from first shot if this is #2.  [name], MD, MPH [facility] 01/27/2018 8:12 AM

## 2018-01-28 NOTE — Telephone Encounter (Signed)
Patient scheduled for Shingrix on 02/04/2018.

## 2018-02-04 ENCOUNTER — Ambulatory Visit: Payer: Medicare Other | Admitting: Family Medicine

## 2018-02-25 ENCOUNTER — Ambulatory Visit: Payer: Self-pay | Admitting: Family Medicine

## 2018-02-26 ENCOUNTER — Ambulatory Visit: Payer: Self-pay | Admitting: Family Medicine

## 2018-03-02 ENCOUNTER — Ambulatory Visit: Payer: Self-pay | Admitting: Family Medicine

## 2018-04-01 ENCOUNTER — Telehealth: Payer: Self-pay

## 2018-04-01 NOTE — Telephone Encounter (Signed)
Tried to call pt to r/s AWV. NANM. Will try again later. Pt needs to schedule her AWV for another day prior to her CPE on 04/15/18. I will be out of the office that day.  -MM

## 2018-04-12 ENCOUNTER — Ambulatory Visit: Payer: Medicare Other

## 2018-04-15 ENCOUNTER — Telehealth: Payer: Self-pay | Admitting: Family Medicine

## 2018-04-15 ENCOUNTER — Ambulatory Visit: Payer: Medicare Other

## 2018-04-15 ENCOUNTER — Encounter: Payer: Medicare Other | Admitting: Family Medicine

## 2018-04-15 NOTE — Telephone Encounter (Signed)
Pt needing to reschedule her CPC and shingles shot.  Asking if Dr. B could work her in.  She couldn't make today because daughter is sick due to chemo and cannot bring her.  Thanks, [employer]

## 2018-04-15 NOTE — Telephone Encounter (Signed)
Patient scheduled for AWE and CPE

## 2018-04-15 NOTE — Telephone Encounter (Signed)
Can schedule up to 3 physicals in a half day to get her in when possible

## 2018-04-16 ENCOUNTER — Telehealth: Payer: Self-pay | Admitting: Family Medicine

## 2018-04-19 ENCOUNTER — Ambulatory Visit: Payer: Medicare Other

## 2018-04-20 ENCOUNTER — Ambulatory Visit (INDEPENDENT_AMBULATORY_CARE_PROVIDER_SITE_OTHER): Payer: Medicare Other | Admitting: *Deleted

## 2018-04-20 ENCOUNTER — Ambulatory Visit: Payer: Medicare Other

## 2018-04-20 DIAGNOSIS — Z23 Encounter for immunization: Secondary | ICD-10-CM | POA: Diagnosis not present

## 2018-04-22 NOTE — Telephone Encounter (Signed)
Scheduled 05/03/18 @ 11 AM.

## 2018-05-03 ENCOUNTER — Ambulatory Visit: Payer: Medicare Other

## 2018-05-11 ENCOUNTER — Ambulatory Visit (INDEPENDENT_AMBULATORY_CARE_PROVIDER_SITE_OTHER): Payer: Medicare Other

## 2018-05-11 VITALS — Ht 64.0 in

## 2018-05-11 DIAGNOSIS — Z0000 Encounter for general adult medical examination without abnormal findings: Secondary | ICD-10-CM

## 2018-05-11 DIAGNOSIS — E2839 Other primary ovarian failure: Secondary | ICD-10-CM

## 2018-05-11 NOTE — Patient Instructions (Addendum)
Christina Rubio , Thank you for taking time to come for your Medicare Wellness Visit. I appreciate your ongoing commitment to your health goals. Please review the following plan we discussed and let me know if I can assist you in the future.   Screening recommendations/referrals: Colonoscopy: Cologuard kit at home, pt to complete when able. Mammogram: Order on file, pt to call and set up apt with DEXA.  Bone Density: Ordered today, pt aware office will contact her to set up apt.  Recommended yearly ophthalmology/optometry visit for glaucoma screening and checkup Recommended yearly dental visit for hygiene and checkup  Vaccinations: Influenza vaccine: Up to date Pneumococcal vaccine: Completed series Tdap vaccine: Pt declines today.  Shingles vaccine: Due for #2 Shingrix.  Advanced directives: Advance directive discussed with you today. I have provided a copy for you to complete at home and have notarized. Once this is complete please bring a copy in to our office so we can scan it into your chart.  Conditions/risks identified: Obesity; Recommend to drink at least 6-8 8oz glasses of water per day.  Next appointment: 05/24/18 @ 11 AM with Dr Brita Romp.    Preventive Care 29 Years and Older, Female Preventive care refers to lifestyle choices and visits with your health care provider that can promote health and wellness. What does preventive care include?  A yearly physical exam. This is also called an annual well check.  Dental exams once or twice a year.  Routine eye exams. Ask your health care provider how often you should have your eyes checked.  Personal lifestyle choices, including:  Daily care of your teeth and gums.  Regular physical activity.  Eating a healthy diet.  Avoiding tobacco and drug use.  Limiting alcohol use.  Practicing safe sex.  Taking low-dose aspirin every day.  Taking vitamin and mineral supplements as recommended by your health care provider. What  happens during an annual well check? The services and screenings done by your health care provider during your annual well check will depend on your age, overall health, lifestyle risk factors, and family history of disease. Counseling  Your health care provider may ask you questions about your:  Alcohol use.  Tobacco use.  Drug use.  Emotional well-being.  Home and relationship well-being.  Sexual activity.  Eating habits.  History of falls.  Memory and ability to understand (cognition).  Work and work Statistician.  Reproductive health. Screening  You may have the following tests or measurements:  Height, weight, and BMI.  Blood pressure.  Lipid and cholesterol levels. These may be checked every 5 years, or more frequently if you are over 28 years old.  Skin check.  Lung cancer screening. You may have this screening every year starting at age 34 if you have a 30-pack-year history of smoking and currently smoke or have quit within the past 15 years.  Fecal occult blood test (FOBT) of the stool. You may have this test every year starting at age 23.  Flexible sigmoidoscopy or colonoscopy. You may have a sigmoidoscopy every 5 years or a colonoscopy every 10 years starting at age 45.  Hepatitis C blood test.  Hepatitis B blood test.  Sexually transmitted disease (STD) testing.  Diabetes screening. This is done by checking your blood sugar (glucose) after you have not eaten for a while (fasting). You may have this done every 1-3 years.  Bone density scan. This is done to screen for osteoporosis. You may have this done starting at age 57.  Mammogram. This may be done every 1-2 years. Talk to your health care provider about how often you should have regular mammograms. Talk with your health care provider about your test results, treatment options, and if necessary, the need for more tests. Vaccines  Your health care provider may recommend certain vaccines, such  as:  Influenza vaccine. This is recommended every year.  Tetanus, diphtheria, and acellular pertussis (Tdap, Td) vaccine. You may need a Td booster every 10 years.  Zoster vaccine. You may need this after age 41.  Pneumococcal 13-valent conjugate (PCV13) vaccine. One dose is recommended after age 60.  Pneumococcal polysaccharide (PPSV23) vaccine. One dose is recommended after age 75. Talk to your health care provider about which screenings and vaccines you need and how often you need them. This information is not intended to replace advice given to you by your health care provider. Make sure you discuss any questions you have with your health care provider. Document Released: 04/06/2015 Document Revised: 11/28/2015 Document Reviewed: 01/09/2015 Elsevier Interactive Patient Education  2017 [employer] Prevention in the Home Falls can cause injuries. They can happen to people of all ages. There are many things you can do to make your home safe and to help prevent falls. What can I do on the outside of my home?  Regularly fix the edges of walkways and driveways and fix any cracks.  Remove anything that might make you trip as you walk through a door, such as a raised step or threshold.  Trim any bushes or trees on the path to your home.  Use bright outdoor lighting.  Clear any walking paths of anything that might make someone trip, such as rocks or tools.  Regularly check to see if handrails are loose or broken. Make sure that both sides of any steps have handrails.  Any raised decks and porches should have guardrails on the edges.  Have any leaves, snow, or ice cleared regularly.  Use sand or salt on walking paths during winter.  Clean up any spills in your garage right away. This includes oil or grease spills. What can I do in the bathroom?  Use night lights.  Install grab bars by the toilet and in the tub and shower. Do not use towel bars as grab bars.  Use  non-skid mats or decals in the tub or shower.  If you need to sit down in the shower, use a plastic, non-slip stool.  Keep the floor dry. Clean up any water that spills on the floor as soon as it happens.  Remove soap buildup in the tub or shower regularly.  Attach bath mats securely with double-sided non-slip rug tape.  Do not have throw rugs and other things on the floor that can make you trip. What can I do in the bedroom?  Use night lights.  Make sure that you have a light by your bed that is easy to reach.  Do not use any sheets or blankets that are too big for your bed. They should not hang down onto the floor.  Have a firm chair that has side arms. You can use this for support while you get dressed.  Do not have throw rugs and other things on the floor that can make you trip. What can I do in the kitchen?  Clean up any spills right away.  Avoid walking on wet floors.  Keep items that you use a lot in easy-to-reach places.  If you need to reach  something above you, use a strong step stool that has a grab bar.  Keep electrical cords out of the way.  Do not use floor polish or wax that makes floors slippery. If you must use wax, use non-skid floor wax.  Do not have throw rugs and other things on the floor that can make you trip. What can I do with my stairs?  Do not leave any items on the stairs.  Make sure that there are handrails on both sides of the stairs and use them. Fix handrails that are broken or loose. Make sure that handrails are as long as the stairways.  Check any carpeting to make sure that it is firmly attached to the stairs. Fix any carpet that is loose or worn.  Avoid having throw rugs at the top or bottom of the stairs. If you do have throw rugs, attach them to the floor with carpet tape.  Make sure that you have a light switch at the top of the stairs and the bottom of the stairs. If you do not have them, ask someone to add them for you. What  else can I do to help prevent falls?  Wear shoes that:  Do not have high heels.  Have rubber bottoms.  Are comfortable and fit you well.  Are closed at the toe. Do not wear sandals.  If you use a stepladder:  Make sure that it is fully opened. Do not climb a closed stepladder.  Make sure that both sides of the stepladder are locked into place.  Ask someone to hold it for you, if possible.  Clearly mark and make sure that you can see:  Any grab bars or handrails.  First and last steps.  Where the edge of each step is.  Use tools that help you move around (mobility aids) if they are needed. These include:  Canes.  Walkers.  Scooters.  Crutches.  Turn on the lights when you go into a dark area. Replace any light bulbs as soon as they burn out.  Set up your furniture so you have a clear path. Avoid moving your furniture around.  If any of your floors are uneven, fix them.  If there are any pets around you, be aware of where they are.  Review your medicines with your doctor. Some medicines can make you feel dizzy. This can increase your chance of falling. Ask your doctor what other things that you can do to help prevent falls. This information is not intended to replace advice given to you by your health care provider. Make sure you discuss any questions you have with your health care provider. Document Released: 01/04/2009 Document Revised: 08/16/2015 Document Reviewed: 04/14/2014 Elsevier Interactive Patient Education  2017 [employer].

## 2018-05-11 NOTE — Progress Notes (Signed)
Subjective:   Christina Rubio is a 68 y.o. female who presents for an Initial Medicare Annual Wellness Visit.  Review of Systems    N/A  Cardiac Risk Factors include: advanced age (>52mn, >>23women);dyslipidemia;hypertension;obesity (BMI >30kg/m2)     Objective:    Today's Vitals   05/11/18 1328 05/11/18 1337  Height: _0  (1.626 m)   PainSc:  2   PainLoc: Back    Body mass index is 68.14 kg/m.  Advanced Directives 05/11/2018 03/14/2016 03/07/2016 01/21/2016 01/16/2016 01/08/2016  Does Patient Have a Medical Advance Directive? _1  No  Would patient like information on creating a medical advance directive? Yes (MAU/Ambulatory/Procedural Areas - Information given) - - No - patient declined information No - patient declined information No - patient declined information    Current Medications (verified) Outpatient Encounter Medications as of 05/11/2018  Medication Sig  . atorvastatin (LIPITOR) 10 MG tablet Take 1 tablet (10 mg total) by mouth at bedtime.  .Marland Kitchenlevothyroxine (SYNTHROID, LEVOTHROID) 50 MCG tablet Take 1 tablet (50 mcg total) by mouth at bedtime.  . metoprolol succinate (TOPROL-XL) 100 MG 24 hr tablet Take 1 tablet (100 mg total) by mouth at bedtime.  . Multiple Vitamins-Minerals (PRESERVISION AREDS 2) CAPS Take 1 tablet by mouth 2 (two) times daily.  . sertraline (ZOLOFT) 100 MG tablet Take 1.5 tablets (150 mg total) by mouth daily.  . valsartan-hydrochlorothiazide (DIOVAN-HCT) 160-25 MG tablet Take 1 tablet by mouth at bedtime.  . ALPRAZolam (XANAX) 0.5 MG tablet TAKE 1 TABLET BY MOUTH DAILY AS NEEDED FOR ANXIETY (Patient not taking: Reported on 05/11/2018)  . buPROPion (WELLBUTRIN XL) 300 MG 24 hr tablet TAKE 1 TABLET BY MOUTH EVERY NIGHT AT BEDTIME (Patient not taking: Reported on 05/11/2018)   No facility-administered encounter medications on file as of 05/11/2018.     Allergies (verified) Epinephrine; Penicillins; and Strawberry extract    History: Past Medical History:  Diagnosis Date  . Anxiety   . Arthritis   . Depression   . Hypertension   . Neuromuscular disorder (HAdwolf   . Osteoporosis   . PONV (postoperative nausea and vomiting)   . Skin cancer    non melanoma  . Spinal stenosis   . Thyroid disease    hyperthyroid s/p partial thyroidectomy   Past Surgical History:  Procedure Laterality Date  . CESAREAN SECTION    . CHOLECYSTECTOMY    . COLON SURGERY    . FEMUR IM NAIL Right 01/09/2016   Procedure: INTRAMEDULLARY (IM) RETROGRADE FEMORAL NAILING RIGHT DISTAL FEMUR;  Surgeon: JDorna Leitz MD;  [facility]  Service: Orthopedics;  Laterality: Right;  . LAPAROSCOPIC GASTRIC SLEEVE RESECTION    . Right thyroidectomy    . TONSILLECTOMY     Family History  Problem Relation Age of Onset  . Lung cancer Mother   . Prostate cancer Father   . Depression Daughter   . Anemia Daughter        [facility]  . Thyroid cancer Maternal Uncle   . Lung cancer Paternal Uncle   . Stroke Maternal Grandmother   . Stroke Maternal Grandfather   . Stroke Paternal Grandmother   . Healthy Sister   . Healthy Brother   . Healthy Sister   . Emphysema Paternal Grandfather   . Colon cancer Neg Hx   . Breast cancer Neg Hx   . Ovarian cancer Neg Hx   . Cervical cancer Neg Hx    Social History   Socioeconomic History  .  Marital status: Widowed    Spouse name: Not on file  . Number of children: 1  . Years of education: 2 master's degrees  . Highest education level: Master's degree (e.g., MA, MS, MEng, MEd, MSW, MBA)  Occupational History  . Occupation: retired    Comment: Proofreader  Social Needs  . Financial resource strain: Not hard at all  . Food insecurity:    Worry: Never true    Inability: Never true  . Transportation needs:    Medical: No    Non-medical: No  Tobacco Use  . Smoking status: Never Smoker  . Smokeless tobacco: Never Used  Substance and Sexual Activity  . Alcohol use: Yes    Comment: 1-2  drinks per year  . Drug use: No  . Sexual activity: Never  Lifestyle  . Physical activity:    Days per week: 0 days    Minutes per session: 0 min  . Stress: To some extent  Relationships  . Social connections:    Talks on phone: Patient refused    Gets together: Patient refused    Attends religious service: Patient refused    Active member of club or organization: Patient refused    Attends meetings of clubs or organizations: Patient refused    Relationship status: Patient refused  Other Topics Concern  . Not on file  Social History Narrative  . Not on file    Tobacco Counseling Counseling given: Not Answered   Clinical Intake:  Pre-visit preparation completed: Yes  Pain : 0-10 Pain Score: 2  Pain Type: Chronic pain Pain Location: Back(Due to spinal stenosis.) Pain Descriptors / Indicators: Burning, Sharp Pain Frequency: Intermittent     Nutritional Status: BMI > 30  Obese Nutritional Risks: Nausea/ vomitting/ diarrhea(Diarrhea occasionally ) Diabetes: No  How often do you need to have someone help you when you read instructions, pamphlets, or other written materials from your doctor or pharmacy?: 1 - Never  Interpreter Needed?: No  Information entered by :: [facility], LPN   Activities of Daily Living In your present state of health, do you have any difficulty performing the following activities: 05/11/2018 05/28/2017  Hearing? - N  Vision? - N  Difficulty concentrating or making decisions? Christina Rubio  Walking or climbing stairs? Y Y  Comment Due to spinal stenosis. -  Dressing or bathing? N Y  Doing errands, shopping? Christina Rubio  Comment Has trouble running errands when has to go in and out of the car due to strugglins to get a walker in and out of the car.  -  Preparing Food and eating ? Y -  Comment Limited cooking. -  Using the Toilet? N -  In the past six months, have you accidently leaked urine? Y -  Comment Occasionally due to taking awhile to get to the  bathroom.  -  Do you have problems with loss of bowel control? N -  Managing your Medications? N -  Managing your Finances? N -  Housekeeping or managing your Housekeeping? Y -  Comment Daughter and cleaning lady helps. -  Some recent data might be hidden     Immunizations and Health Maintenance Immunization History  Administered Date(s) Administered  . Influenza, High Dose Seasonal PF 01/29/2017, 04/20/2018  . Pneumococcal Conjugate-13 05/28/2017  . Zoster Recombinat (Shingrix) 11/17/2017   Health Maintenance Due  Topic Date Due  . Hepatitis C Screening  1950/06/14  . TETANUS/TDAP  09/15/1970  . MAMMOGRAM  09/14/2001  . COLONOSCOPY  09/14/2001  . DEXA SCAN  09/14/2016    Patient Care Team: [name], MD as PCP - General (Family Medicine) Dorna Leitz, MD as Consulting Physician (Orthopedic Surgery)  Indicate any recent Medical Services you may have received from other than [facility] providers in the past year (date may be approximate).     Assessment:   This is a routine wellness examination for [facility]  Hearing/Vision screen Vision Screening Comments: Pt goes to a [facility] eye clinic for vision screenings yearly.   Dietary issues and exercise activities discussed: Current Exercise Habits: Home exercise routine, Type of exercise: stretching, Time (Minutes): 15, Frequency (Times/Week): 7, Weekly Exercise (Minutes/Week): 105, Intensity: Mild, Exercise limited by: orthopedic condition(s)  Goals    . DIET - INCREASE WATER INTAKE     Recommend to drink at least 6-8 8oz glasses of water per day.      Depression Screen PHQ 2/9 Scores 05/11/2018 11/17/2017 07/28/2017 05/28/2017  PHQ - 2 Score _0 PHQ- 9 Score _1 Fall Risk Fall Risk  05/11/2018 05/28/2017  Falls in the past year? 0 No    FALL RISK PREVENTION PERTAINING TO THE HOME: Any stairs in or around the home? No  If so, do they handrails? N/A  Home free of loose throw rugs in walkways, pet beds,  electrical cords, etc? Yes  Adequate lighting in your home to reduce risk of falls? Yes   ASSISTIVE DEVICES UTILIZED TO PREVENT FALLS:  Life alert? Yes  Use of a cane, walker or w/c? Yes  Grab bars in the bathroom? No  Shower chair or bench in shower? Yes  Elevated toilet seat or a handicapped toilet? No    TIMED UP AND GO:  Was the test performed? No .    Cognitive Function:     6CIT Screen 05/11/2018  What Year? 0 points  What month? 0 points  What time? 0 points  Count back from 20 0 points  Months in reverse 0 points  Repeat phrase 0 points  Total Score 0    Screening Tests Health Maintenance  Topic Date Due  . Hepatitis C Screening  14-Apr-1950  . TETANUS/TDAP  09/15/1970  . MAMMOGRAM  09/14/2001  . COLONOSCOPY  09/14/2001  . DEXA SCAN  09/14/2016  . PNA vac Low Risk Adult (2 of 2 - PPSV23) 05/29/2018  . INFLUENZA VACCINE  Completed    Qualifies for Shingles Vaccine? Yes  Due for Shingrix #2.   Tdap: Although this vaccine is not a covered service during a Wellness Exam, does the patient still wish to receive this vaccine today?  No .  Education has been provided regarding the importance of this vaccine. Advised may receive this vaccine at local pharmacy or Health Dept. Aware to provide a copy of the vaccination record if obtained from local pharmacy or Health Dept. Verbalized acceptance and understanding.  Flu Vaccine: Up to date  Pneumococcal Vaccine: Up to date   Cancer Screenings:  Colorectal Screening: Currently due, pt has cologuard kit at home and will completed when able to.  Mammogram: Order on file. Pt to call and schedule apt this year.   Bone Density: Ordered today. Pt aware the office will call re: appt.  Lung Cancer Screening: (Low Dose CT Chest recommended if Age 70-80 years, 30 pack-year currently smoking OR have quit w/in 15years.) does not qualify.    Additional Screening:  Hepatitis C Screening: does qualify; however declines today  and would like to add this order to her yearly BW orders.  Vision Screening: Recommended annual ophthalmology exams for early detection of glaucoma and other disorders of the eye.  Dental Screening: Recommended annual dental exams for proper oral hygiene  Community Resource Referral:  CRR required this visit? Yes for transportation.     Plan:  I have personally reviewed and addressed the Medicare Annual Wellness questionnaire and have noted the following in the patient's chart:  A. Medical and social history B. Use of alcohol, tobacco or illicit drugs  C. Current medications and supplements D. Functional ability and status E.  Nutritional status F.  Physical activity G. Advance directives H. List of other physicians I.  Hospitalizations, surgeries, and ER visits in previous 12 months J.  [place] such as hearing and vision if needed, cognitive and depression L. Referrals and appointments - none  In addition, I have reviewed and discussed with patient certain preventive protocols, quality metrics, and best practice recommendations. A written personalized care plan for preventive services as well as general preventive health recommendations were provided to patient.  See attached scanned questionnaire for additional information.   Signed,  Fabio Neighbors, LPN Nurse Health Advisor   Nurse Recommendations: Pt declined the tetanus vaccine and Hep C lab today. Pt would like the Hep C lab to be added to her yearly BW orders at [facility] apt. Ordered DEXA scan today. Pt to set up mammogram when the office calls to set up the DEXA. Pt declined a colonoscopy referral and states she has the cologuard kit at home and she will complete this when she is able to.

## 2018-05-21 ENCOUNTER — Other Ambulatory Visit: Payer: Self-pay | Admitting: Family Medicine

## 2018-05-21 DIAGNOSIS — F418 Other specified anxiety disorders: Secondary | ICD-10-CM

## 2018-05-21 MED ORDER — SERTRALINE HCL 100 MG PO TABS: 150.0000 mg | ORAL_TABLET | Freq: Every day | ORAL | 3 refills | Status: DC

## 2018-05-21 MED ORDER — ATORVASTATIN CALCIUM 10 MG PO TABS: 10.0000 mg | ORAL_TABLET | Freq: Every day | ORAL | 3 refills | Status: DC

## 2018-05-21 MED ORDER — LEVOTHYROXINE SODIUM 50 MCG PO TABS: 50.0000 ug | ORAL_TABLET | Freq: Every day | ORAL | 3 refills | Status: DC

## 2018-05-21 MED ORDER — VALSARTAN-HYDROCHLOROTHIAZIDE 160-25 MG PO TABS: 1.0000 | ORAL_TABLET | Freq: Every day | ORAL | 3 refills | Status: DC

## 2018-05-21 MED ORDER — METOPROLOL SUCCINATE ER 100 MG PO TB24: 100.0000 mg | ORAL_TABLET | Freq: Every day | ORAL | 3 refills | Status: DC

## 2018-05-21 NOTE — Telephone Encounter (Signed)
Pt called saying she needs a refill on all her current medications.  She will run out of some off these today  She uses [facility]  Cb#  [id number]  Thanks, Con Memos

## 2018-05-24 ENCOUNTER — Ambulatory Visit (INDEPENDENT_AMBULATORY_CARE_PROVIDER_SITE_OTHER): Payer: Medicare Other | Admitting: Family Medicine

## 2018-05-24 ENCOUNTER — Encounter: Payer: Self-pay | Admitting: Family Medicine

## 2018-05-24 VITALS — BP 132/86 | HR 53 | Temp 98.4°F | Ht 64.0 in | Wt 388.0 lb

## 2018-05-24 DIAGNOSIS — S81812A Laceration without foreign body, left lower leg, initial encounter: Secondary | ICD-10-CM

## 2018-05-24 DIAGNOSIS — F411 Generalized anxiety disorder: Secondary | ICD-10-CM

## 2018-05-24 DIAGNOSIS — I10 Essential (primary) hypertension: Secondary | ICD-10-CM

## 2018-05-24 DIAGNOSIS — F3340 Major depressive disorder, recurrent, in remission, unspecified: Secondary | ICD-10-CM

## 2018-05-24 DIAGNOSIS — R7303 Prediabetes: Secondary | ICD-10-CM | POA: Insufficient documentation

## 2018-05-24 DIAGNOSIS — E034 Atrophy of thyroid (acquired): Secondary | ICD-10-CM

## 2018-05-24 DIAGNOSIS — R739 Hyperglycemia, unspecified: Secondary | ICD-10-CM | POA: Diagnosis not present

## 2018-05-24 DIAGNOSIS — Z23 Encounter for immunization: Secondary | ICD-10-CM

## 2018-05-24 DIAGNOSIS — E785 Hyperlipidemia, unspecified: Secondary | ICD-10-CM

## 2018-05-24 DIAGNOSIS — I878 Other specified disorders of veins: Secondary | ICD-10-CM

## 2018-05-24 DIAGNOSIS — Z1159 Encounter for screening for other viral diseases: Secondary | ICD-10-CM | POA: Diagnosis not present

## 2018-05-24 DIAGNOSIS — E119 Type 2 diabetes mellitus without complications: Secondary | ICD-10-CM | POA: Insufficient documentation

## 2018-05-24 MED ORDER — ALPRAZOLAM 0.5 MG PO TABS: 0.5000 mg | ORAL_TABLET | Freq: Every day | ORAL | 0 refills | Status: DC | PRN

## 2018-05-24 NOTE — Assessment & Plan Note (Signed)
Well controlled Continue current medications Recheck metabolic panel F/u in 6 months  

## 2018-05-24 NOTE — Assessment & Plan Note (Signed)
Discussed importance of elevation and compression as well as weight loss

## 2018-05-24 NOTE — Assessment & Plan Note (Signed)
Stable Continue zoloft Ok to use Xanax very sparingly

## 2018-05-24 NOTE — Patient Instructions (Signed)
Preventive Care 67 Years and Older, Female Preventive care refers to lifestyle choices and visits with your health care provider that can promote health and wellness. What does preventive care include?  A yearly physical exam. This is also called an annual well check.  Dental exams once or twice a year.  Routine eye exams. Ask your health care provider how often you should have your eyes checked.  Personal lifestyle choices, including: ? Daily care of your teeth and gums. ? Regular physical activity. ? Eating a healthy diet. ? Avoiding tobacco and drug use. ? Limiting alcohol use. ? Practicing safe sex. ? Taking low-dose aspirin every day. ? Taking vitamin and mineral supplements as recommended by your health care provider. What happens during an annual well check? The services and screenings done by your health care provider during your annual well check will depend on your age, overall health, lifestyle risk factors, and family history of disease. Counseling Your health care provider may ask you questions about your:  Alcohol use.  Tobacco use.  Drug use.  Emotional well-being.  Home and relationship well-being.  Sexual activity.  Eating habits.  History of falls.  Memory and ability to understand (cognition).  Work and work Statistician.  Reproductive health.  Screening You may have the following tests or measurements:  Height, weight, and BMI.  Blood pressure.  Lipid and cholesterol levels. These may be checked every 5 years, or more frequently if you are over 67 years old.  Skin check.  Lung cancer screening. You may have this screening every year starting at age 67 if you have a 30-pack-year history of smoking and currently smoke or have quit within the past 15 years.  Colorectal cancer screening. All adults should have this screening starting at age 67 and continuing until age 46. You will have tests every 1-10 years, depending on your results and the  type of screening test. People at increased risk should start screening at an earlier age. Screening tests may include: ? Guaiac-based fecal occult blood testing. ? Fecal immunochemical test (FIT). ? Stool DNA test. ? Virtual colonoscopy. ? Sigmoidoscopy. During this test, a flexible tube with a tiny camera (sigmoidoscope) is used to examine your rectum and lower colon. The sigmoidoscope is inserted through your anus into your rectum and lower colon. ? Colonoscopy. During this test, a long, thin, flexible tube with a tiny camera (colonoscope) is used to examine your entire colon and rectum.  Hepatitis C blood test.  Hepatitis B blood test.  Sexually transmitted disease (STD) testing.  Diabetes screening. This is done by checking your blood sugar (glucose) after you have not eaten for a while (fasting). You may have this done every 1-3 years.  Bone density scan. This is done to screen for osteoporosis. You may have this done starting at age 67.  Mammogram. This may be done every 1-2 years. Talk to your health care provider about how often you should have regular mammograms. Talk with your health care provider about your test results, treatment options, and if necessary, the need for more tests. Vaccines Your health care provider may recommend certain vaccines, such as:  Influenza vaccine. This is recommended every year.  Tetanus, diphtheria, and acellular pertussis (Tdap, Td) vaccine. You may need a Td booster every 10 years.  Varicella vaccine. You may need this if you have not been vaccinated.  Zoster vaccine. You may need this after age 67.  Measles, mumps, and rubella (MMR) vaccine. You may need at least  one dose of MMR if you were born in 1957 or later. You may also need a second dose.  Pneumococcal 13-valent conjugate (PCV13) vaccine. One dose is recommended after age 24.  Pneumococcal polysaccharide (PPSV23) vaccine. One dose is recommended after age 24.  Meningococcal  vaccine. You may need this if you have certain conditions.  Hepatitis A vaccine. You may need this if you have certain conditions or if you travel or work in places where you may be exposed to hepatitis A.  Hepatitis B vaccine. You may need this if you have certain conditions or if you travel or work in places where you may be exposed to hepatitis B.  Haemophilus influenzae type b (Hib) vaccine. You may need this if you have certain conditions. Talk to your health care provider about which screenings and vaccines you need and how often you need them. This information is not intended to replace advice given to you by your health care provider. Make sure you discuss any questions you have with your health care provider. Document Released: 04/06/2015 Document Revised: 04/30/2017 Document Reviewed: 01/09/2015 Elsevier Interactive Patient Education  2019 [employer].

## 2018-05-24 NOTE — Assessment & Plan Note (Signed)
Stable Discontinued Wellbutrin as it didn't seem to be helping Continue Zoloft Can continue Xanax sparingly

## 2018-05-24 NOTE — Progress Notes (Signed)
Patient: Christina Rubio, Female    DOB: 1950/09/17, 68 y.o.   MRN: [id number] Visit Date: 05/24/2018  Today's Provider: Lavon Paganini, MD   Chief Complaint  Patient presents with  . Hypothyroidism  . Hypertension  . Hyperlipidemia   Subjective:    I, Porsha McClurkin CMA, am acting as a scribe for Lavon Paganini, MD.    F/u to Annual wellness visit Christina Rubio is a 68 y.o. female. She feels fairly well, due to stress. She reports exercising none. She reports she is sleeping poorly.  Has cologuard at home, but has not completed it yet. Has not scheduled mammogram, but has upcoming DEXA ----------------------------------------------------------- AWE with [facility]: 05/11/2018  HTN: - Medications: Diovan-HCT - Compliance: good - Checking BP at home: no - Denies any SOB, CP, vision changes, LE edema, medication SEs, or symptoms of hypotension  HLD - medications: Lipitor 10 mg daily - compliance: good - medication SEs: none  Hypothyroidism: Doing well, taking Synthroid with good compliance. No symptoms  Review of Systems  Constitutional: Positive for activity change and fatigue.  HENT: Positive for sinus pressure.   Eyes: Negative.   Respiratory: Positive for shortness of breath.   Cardiovascular: Negative.   Endocrine: Positive for cold intolerance.  Genitourinary: Positive for enuresis.  Musculoskeletal: Positive for back pain.  Skin: Positive for wound.  Allergic/Immunologic: Negative.   Neurological: Negative.   Hematological: Negative.   Psychiatric/Behavioral: Positive for sleep disturbance.    Social History   Socioeconomic History  . Marital status: Widowed    Spouse name: Not on file  . Number of children: 1  . Years of education: 2 master's degrees  . Highest education level: Master's degree (e.g., MA, MS, MEng, MEd, MSW, MBA)  Occupational History  . Occupation: retired    Comment: Proofreader  Social Needs  . Financial resource  strain: Not hard at all  . Food insecurity:    Worry: Never true    Inability: Never true  . Transportation needs:    Medical: No    Non-medical: No  Tobacco Use  . Smoking status: Never Smoker  . Smokeless tobacco: Never Used  Substance and Sexual Activity  . Alcohol use: Yes    Comment: 1-2 drinks per year  . Drug use: No  . Sexual activity: Never  Lifestyle  . Physical activity:    Days per week: 0 days    Minutes per session: 0 min  . Stress: To some extent  Relationships  . Social connections:    Talks on phone: Patient refused    Gets together: Patient refused    Attends religious service: Patient refused    Active member of club or organization: Patient refused    Attends meetings of clubs or organizations: Patient refused    Relationship status: Patient refused  . Intimate partner violence:    Fear of current or ex partner: Patient refused    Emotionally abused: Patient refused    Physically abused: Patient refused    Forced sexual activity: Patient refused  Other Topics Concern  . Not on file  Social History Narrative  . Not on file    Past Medical History:  Diagnosis Date  . Anxiety   . Arthritis   . Depression   . Hypertension   . Neuromuscular disorder ([place])   . Osteoporosis   . PONV (postoperative nausea and vomiting)   . Skin cancer    non melanoma  . Spinal stenosis   .  Thyroid disease    hyperthyroid s/p partial thyroidectomy     Patient Active Problem List   Diagnosis Date Noted  . GAD (generalized anxiety disorder) 05/24/2018  . Hyperglycemia 05/24/2018  . Venous stasis 07/28/2017  . Spinal stenosis 05/28/2017  . DJD (degenerative joint disease) 05/28/2017  . Essential hypertension, benign 02/10/2016  . Dyslipidemia 02/10/2016  . MDD (major depressive disorder) 02/10/2016  . Hypothyroidism due to acquired atrophy of thyroid 02/10/2016  . Morbid obesity ([place]) 01/09/2016    Past Surgical History:  Procedure Laterality Date  .  CESAREAN SECTION    . CHOLECYSTECTOMY    . COLON SURGERY    . FEMUR IM NAIL Right 01/09/2016   Procedure: INTRAMEDULLARY (IM) RETROGRADE FEMORAL NAILING RIGHT DISTAL FEMUR;  Surgeon: Dorna Leitz, MD;  Location: [facility];  Service: Orthopedics;  Laterality: Right;  . LAPAROSCOPIC GASTRIC SLEEVE RESECTION    . Right thyroidectomy    . TONSILLECTOMY      Her family history includes Anemia in her daughter; Depression in her daughter; Emphysema in her paternal grandfather; Healthy in her brother, sister, and sister; Lung cancer in her mother and paternal uncle; Prostate cancer in her father; Stroke in her maternal grandfather, maternal grandmother, and paternal grandmother; Thyroid cancer in her maternal uncle. There is no history of Colon cancer, Breast cancer, Ovarian cancer, or Cervical cancer.   Current Outpatient Medications:  .  atorvastatin (LIPITOR) 10 MG tablet, Take 1 tablet (10 mg total) by mouth at bedtime., Disp: 90 tablet, Rfl: 3 .  levothyroxine (SYNTHROID, LEVOTHROID) 50 MCG tablet, Take 1 tablet (50 mcg total) by mouth at bedtime., Disp: 90 tablet, Rfl: 3 .  metoprolol succinate (TOPROL-XL) 100 MG 24 hr tablet, Take 1 tablet (100 mg total) by mouth at bedtime., Disp: 90 tablet, Rfl: 3 .  Multiple Vitamins-Minerals (PRESERVISION AREDS 2) CAPS, Take 1 tablet by mouth 2 (two) times daily., Disp: , Rfl:  .  sertraline (ZOLOFT) 100 MG tablet, Take 1.5 tablets (150 mg total) by mouth daily., Disp: 135 tablet, Rfl: 3 .  valsartan-hydrochlorothiazide (DIOVAN-HCT) 160-25 MG tablet, Take 1 tablet by mouth at bedtime., Disp: 90 tablet, Rfl: 3 .  ALPRAZolam (XANAX) 0.5 MG tablet, Take 1 tablet (0.5 mg total) by mouth daily as needed. for anxiety, Disp: 30 tablet, Rfl: 0  Patient Care Team: [name], MD as PCP - General (Family Medicine) Dorna Leitz, MD as Consulting Physician (Orthopedic Surgery)    Objective:    Vitals: BP 132/86 (BP Location: Left Wrist, Patient Position:  Sitting, Cuff Size: Normal)   Pulse (!) 53   Temp 98.4 F (36.9 C) (Oral)   Ht 5\' 4"  (1.626 m)   Wt (!) 388 lb (176 kg)   SpO2 99%   BMI 66.60 kg/m   Physical Exam Vitals signs reviewed.  Constitutional:      General: She is not in acute distress.    Appearance: Normal appearance. She is well-developed. She is not diaphoretic.  HENT:     Head: Normocephalic and atraumatic.     Right Ear: External ear normal.     Left Ear: External ear normal.     Nose: Nose normal.     Mouth/Throat:     Mouth: Mucous membranes are moist.     Pharynx: Oropharynx is clear. No oropharyngeal exudate.  Eyes:     General: No scleral icterus.    Conjunctiva/sclera: Conjunctivae normal.     Pupils: Pupils are equal, round, and reactive to light.  Neck:  Musculoskeletal: Neck supple.     Thyroid: No thyromegaly.  Cardiovascular:     Rate and Rhythm: Normal rate and regular rhythm.     Pulses: Normal pulses.     Heart sounds: Normal heart sounds. No murmur.  Pulmonary:     Effort: Pulmonary effort is normal. No respiratory distress.     Breath sounds: Normal breath sounds. No wheezing or rales.  Abdominal:     General: Bowel sounds are normal. There is no distension.     Palpations: Abdomen is soft.     Tenderness: There is no abdominal tenderness. There is no guarding or rebound.  Musculoskeletal:     Right lower leg: Edema present.     Left lower leg: Edema present.  Lymphadenopathy:     Cervical: No cervical adenopathy.  Skin:    General: Skin is warm and dry.     Capillary Refill: Capillary refill takes less than 2 seconds.     Comments: Chronic venous stasis changes of b/l LEs.  Small laceration of LLE  Neurological:     Mental Status: She is alert and oriented to person, place, and time. Mental status is at baseline.  Psychiatric:        Mood and Affect: Mood normal.        Behavior: Behavior normal.        Thought Content: Thought content normal.     Activities of Daily  Living In your present state of health, do you have any difficulty performing the following activities: 05/11/2018 05/28/2017  Hearing? - N  Vision? - N  Difficulty concentrating or making decisions? Tempie Donning  Walking or climbing stairs? Y Y  Comment Due to spinal stenosis. -  Dressing or bathing? N Y  Doing errands, shopping? Tempie Donning  Comment Has trouble running errands when has to go in and out of the car due to strugglins to get a walker in and out of the car.  -  Preparing Food and eating ? Y -  Comment Limited cooking. -  Using the Toilet? N -  In the past six months, have you accidently leaked urine? Y -  Comment Occasionally due to taking awhile to get to the bathroom.  -  Do you have problems with loss of bowel control? N -  Managing your Medications? N -  Managing your Finances? N -  Housekeeping or managing your Housekeeping? Y -  Comment Daughter and cleaning lady helps. -  Some recent data might be hidden    Fall Risk Assessment Fall Risk  05/11/2018 05/28/2017  Falls in the past year? 0 No     Depression Screen PHQ 2/9 Scores 05/11/2018 11/17/2017 07/28/2017 05/28/2017  PHQ - 2 Score 4 5 1 2   PHQ- 9 Score 12 15 6 7     6CIT Screen 05/11/2018  What Year? 0 points  What month? 0 points  What time? 0 points  Count back from 20 0 points  Months in reverse 0 points  Repeat phrase 0 points  Total Score 0      Assessment & Plan:     Annual Wellness Visit f/u  Reviewed patient's Family Medical History Reviewed and updated list of patient's medical providers Assessment of cognitive impairment was done Assessed patient's functional ability Established a written schedule for health screening [place] Completed and Reviewed  Exercise Activities and Dietary recommendations Goals    . DIET - INCREASE WATER INTAKE     Recommend to drink at  least 6-8 8oz glasses of water per day.       Immunization History  Administered Date(s) Administered  . Influenza,  High Dose Seasonal PF 01/29/2017, 04/20/2018  . Pneumococcal Conjugate-13 05/28/2017  . Td 05/24/2018  . Zoster Recombinat (Shingrix) 11/17/2017    Health Maintenance  Topic Date Due  . Hepatitis C Screening  [birth year]  . TETANUS/TDAP  09/15/1970  . MAMMOGRAM  09/14/2001  . COLONOSCOPY  09/14/2001  . DEXA SCAN  09/14/2016  . PNA vac Low Risk Adult (2 of 2 - PPSV23) 05/29/2018  . INFLUENZA VACCINE  Completed     Discussed health benefits of physical activity, and encouraged her to engage in regular exercise appropriate for her age and condition.    ------------------------------------------------------------------------------------------------------------   Problem List Items Addressed This Visit      Cardiovascular and Mediastinum   Essential hypertension, benign - Primary    Well controlled Continue current medications Recheck metabolic panel F/u in 6 months      Relevant Orders   Comprehensive metabolic panel     Endocrine   Hypothyroidism due to acquired atrophy of thyroid    Asymptomatic Previously well controlled Continue Synthroid at current dose Recheck TSH F/u in 6 months      Relevant Orders   TSH     Other   Morbid obesity ([place]) (Chronic)    Discussed importance of healthy weight management, diet, exercise      Dyslipidemia    Continue lipitor Recheck lipid [place] CMP      Relevant Orders   Lipid panel   MDD (major depressive disorder)    Stable Discontinued Wellbutrin as it didn't seem to be helping Continue Zoloft Can continue Xanax sparingly      Relevant Medications   ALPRAZolam (XANAX) 0.5 MG tablet   Venous stasis    Discussed importance of elevation and compression as well as weight loss      GAD (generalized anxiety disorder)    Stable Continue zoloft Ok to use Xanax very sparingly      Relevant Medications   ALPRAZolam (XANAX) 0.5 MG tablet   Hyperglycemia    Check A1c      Relevant Orders   Hemoglobin A1c     Other Visit Diagnoses    Need for hepatitis C screening test       Relevant Orders   Hepatitis C Antibody   Laceration of left lower extremity, initial encounter       Relevant Orders   Td vaccine greater than or equal to 7yo preservative free IM (Completed)       Return in about 6 months (around 11/24/2018) for chronic disease f/u.   The entirety of the information documented in the History of Present Illness, Review of Systems and Physical Exam were personally obtained by me. Portions of this information were initially documented by [facility], CMA and reviewed by me for thoroughness and accuracy.    [name], MD, MPH [facility] 05/24/2018 1:54 PM

## 2018-05-24 NOTE — Assessment & Plan Note (Signed)
Discussed importance of healthy weight management, diet, exercise

## 2018-05-24 NOTE — Assessment & Plan Note (Signed)
Asymptomatic Previously well controlled Continue Synthroid at current dose Recheck TSH F/u in 6 months

## 2018-05-24 NOTE — Assessment & Plan Note (Signed)
Continue lipitor Recheck lipid [place] CMP

## 2018-05-24 NOTE — Assessment & Plan Note (Signed)
Check A1c. 

## 2018-05-25 ENCOUNTER — Telehealth: Payer: Self-pay

## 2018-05-25 DIAGNOSIS — E034 Atrophy of thyroid (acquired): Secondary | ICD-10-CM

## 2018-05-25 LAB — COMPREHENSIVE METABOLIC PANEL
ALBUMIN: 4.2 g/dL (ref 3.8–4.8)
ALT: 14 IU/L (ref 0–32)
AST: 24 IU/L (ref 0–40)
Albumin/Globulin Ratio: 1.4 (ref 1.2–2.2)
Alkaline Phosphatase: 43 IU/L (ref 39–117)
BUN/Creatinine Ratio: 15 (ref 12–28)
BUN: 15 mg/dL (ref 8–27)
Bilirubin Total: 0.7 mg/dL (ref 0.0–1.2)
CO2: 21 mmol/L (ref 20–29)
Calcium: 9.4 mg/dL (ref 8.7–10.3)
Chloride: 95 mmol/L — ABNORMAL LOW (ref 96–106)
Creatinine, Ser: 1 mg/dL (ref 0.57–1.00)
GFR calc non Af Amer: 59 mL/min/{1.73_m2} — ABNORMAL LOW (ref >59)
GFR, EST AFRICAN AMERICAN: 68 mL/min/{1.73_m2} (ref >59)
GLOBULIN, TOTAL: 3.1 g/dL (ref 1.5–4.5)
Glucose: 126 mg/dL — ABNORMAL HIGH (ref 65–99)
Potassium: 3.5 mmol/L (ref 3.5–5.2)
Sodium: 137 mmol/L (ref 134–144)
TOTAL PROTEIN: 7.3 g/dL (ref 6.0–8.5)

## 2018-05-25 LAB — LIPID PANEL
Chol/HDL Ratio: 4 ratio (ref 0.0–4.4)
Cholesterol, Total: 170 mg/dL (ref 100–199)
HDL: 42 mg/dL (ref >39)
LDL Calculated: 96 mg/dL (ref 0–99)
Triglycerides: 161 mg/dL — ABNORMAL HIGH (ref 0–149)
VLDL Cholesterol Cal: 32 mg/dL (ref 5–40)

## 2018-05-25 LAB — HEPATITIS C ANTIBODY: Hep C Virus Ab: <0.1 s/co ratio (ref 0.0–0.9)

## 2018-05-25 LAB — TSH: TSH: 6.82 u[IU]/mL — ABNORMAL HIGH (ref 0.450–4.500)

## 2018-05-25 LAB — HEMOGLOBIN A1C
Est. average glucose Bld gHb Est-mCnc: 126 mg/dL
Hgb A1c MFr Bld: 6 % — ABNORMAL HIGH (ref 4.8–5.6)

## 2018-05-25 NOTE — Telephone Encounter (Signed)
-----   Message from [name], MD sent at 05/25/2018 12:07 PM EST ----- Normal kidney function, liver function, electrolytes.  Stable cholesterol.  A1c is in prediabetes range.  Recommend low carb diet and continue to monitor.  Neg Hep C screening.  TSH is elevated meaning that she is not getting enough synthroid.  Taking this regularly?  If yes, we will increase the dose.

## 2018-05-25 NOTE — Telephone Encounter (Signed)
Patient advised. She has been taking Synthroid regularly. She would like the new RX sent to [facility].

## 2018-05-25 NOTE — Telephone Encounter (Signed)
LMTCB

## 2018-05-26 MED ORDER — LEVOTHYROXINE SODIUM 75 MCG PO TABS: 75.0000 ug | ORAL_TABLET | Freq: Every day | ORAL | 3 refills | Status: DC

## 2018-05-26 NOTE — Telephone Encounter (Signed)
Rx sent. Recheck TSH in ~8 weeks

## 2018-05-26 NOTE — Telephone Encounter (Signed)
Patient advised. Lab ordered as a future lab. Will need to be released when patient comes in.

## 2018-06-17 ENCOUNTER — Telehealth: Payer: Self-pay

## 2018-06-17 NOTE — Telephone Encounter (Signed)
06/17/2018 left message on voicemail to call me back.MA

## 2018-06-21 ENCOUNTER — Telehealth: Payer: Self-pay

## 2018-06-21 NOTE — Telephone Encounter (Signed)
06/21/2018 Spoke with patient about transportation resources. Patient requested resources be emailed to her.MA

## 2018-06-29 ENCOUNTER — Other Ambulatory Visit: Payer: Self-pay

## 2018-06-30 ENCOUNTER — Telehealth: Payer: Self-pay

## 2018-06-30 NOTE — Telephone Encounter (Signed)
06/30/2018 Patient has received emailed resources and will call if she has any questions or needs further assistance.MA

## 2018-07-23 ENCOUNTER — Ambulatory Visit (INDEPENDENT_AMBULATORY_CARE_PROVIDER_SITE_OTHER): Payer: Medicare Other | Admitting: Family Medicine

## 2018-07-23 ENCOUNTER — Encounter: Payer: Self-pay | Admitting: Family Medicine

## 2018-07-23 DIAGNOSIS — H109 Unspecified conjunctivitis: Secondary | ICD-10-CM

## 2018-07-23 MED ORDER — POLYMYXIN B-TRIMETHOPRIM 10000-0.1 UNIT/ML-% OP SOLN: 2.0000 [drp] | Freq: Four times a day (QID) | OPHTHALMIC | 0 refills | Status: AC

## 2018-07-23 NOTE — Progress Notes (Signed)
Patient: Christina Rubio Female    DOB: 07-21-1950   68 y.o.   MRN: [id number] Visit Date: 07/23/2018  Today's Provider: Lavon Paganini, MD   Chief Complaint  Patient presents with  . Eye Problem   Subjective:    Virtual Visit via Video Note  I connected with Christina Rubio on 07/23/18 at  2:40 PM EDT by a video enabled telemedicine application and verified that I am speaking with the correct person using two identifiers.   Patient location: home Provider location: home office  Persons involved in the visit: patient, provider     I discussed the limitations of evaluation and management by telemedicine and the availability of in person appointments. The patient expressed understanding and agreed to proceed.   Eye Problem   The left eye is affected. This is a new problem. The current episode started today. The problem occurs constantly. The problem has been gradually worsening. There was no injury mechanism. Associated symptoms include an eye discharge, eye redness and itching. She has tried nothing for the symptoms.   Left eye Started this morning Purulent drainage and red eye Itching and painful feeling No pain with eye movements, no change in vision Does not wear contacts   Allergies  Allergen Reactions  . Epinephrine Palpitations    faint  . Penicillins Rash    Has patient had a PCN reaction causing immediate rash, facial/tongue/throat swelling, SOB or lightheadedness with hypotension:  Yes Has patient had a PCN reaction causing severe rash involving mucus membranes or skin necrosis: No Has patient had a PCN reaction that required hospitalization: No Has patient had a PCN reaction occurring within the last 10 years: No If all of the above answers are "NO", then may proceed with Cephalosporin use.;  . Strawberry Extract Rash     Current Outpatient Medications:  .  ALPRAZolam (XANAX) 0.5 MG tablet, Take 1 tablet (0.5 mg total) by mouth daily as needed. for  anxiety, Disp: 30 tablet, Rfl: 0 .  atorvastatin (LIPITOR) 10 MG tablet, Take 1 tablet (10 mg total) by mouth at bedtime., Disp: 90 tablet, Rfl: 3 .  levothyroxine (SYNTHROID, LEVOTHROID) 75 MCG tablet, Take 1 tablet (75 mcg total) by mouth at bedtime., Disp: 90 tablet, Rfl: 3 .  metoprolol succinate (TOPROL-XL) 100 MG 24 hr tablet, Take 1 tablet (100 mg total) by mouth at bedtime., Disp: 90 tablet, Rfl: 3 .  Multiple Vitamins-Minerals (PRESERVISION AREDS 2) CAPS, Take 1 tablet by mouth 2 (two) times daily., Disp: , Rfl:  .  sertraline (ZOLOFT) 100 MG tablet, Take 1.5 tablets (150 mg total) by mouth daily., Disp: 135 tablet, Rfl: 3 .  valsartan-hydrochlorothiazide (DIOVAN-HCT) 160-25 MG tablet, Take 1 tablet by mouth at bedtime., Disp: 90 tablet, Rfl: 3 .  trimethoprim-polymyxin b (POLYTRIM) ophthalmic solution, Place 2 drops into the left eye every 6 (six) hours for 7 days., Disp: 10 mL, Rfl: 0   Review of Systems  Constitutional: Negative.   Eyes: Positive for pain, discharge, redness and itching.  Respiratory: Negative.   Cardiovascular: Negative.   Musculoskeletal: Negative.     Social History   Tobacco Use  . Smoking status: Never Smoker  . Smokeless tobacco: Never Used  Substance Use Topics  . Alcohol use: Yes    Comment: 1-2 drinks per year      Objective:   There were no vitals taken for this visit. There were no vitals filed for this visit.   Physical Exam Constitutional:  Appearance: Normal appearance.  HENT:     Head: Normocephalic and atraumatic.  Eyes:     Extraocular Movements: Extraocular movements intact.     Comments: L eye red, but unable to see details due to poor video quality  Pulmonary:     Effort: Pulmonary effort is normal. No respiratory distress.  Neurological:     Mental Status: She is alert and oriented to person, place, and time.  Psychiatric:        Mood and Affect: Mood normal.        Behavior: Behavior normal.          Assessment & Plan    I discussed the assessment and treatment plan with the patient. The patient was provided an opportunity to ask questions and all were answered. The patient agreed with the plan and demonstrated an understanding of the instructions.   The patient was advised to call back or seek an in-person evaluation if the symptoms worsen or if the condition fails to improve as anticipated.   1. Bacterial conjunctivitis of left eye - new problems - signs and symptoms c/w conjunctivitis - more concerning for bacterial etiology given unilateral nature - will treat with Polytrim - avoiding touching eye, wash hands regularly - no red flag symptoms - discussed return precautions    Meds ordered this encounter  Medications  . trimethoprim-polymyxin b (POLYTRIM) ophthalmic solution    Sig: Place 2 drops into the left eye every 6 (six) hours for 7 days.    Dispense:  10 mL    Refill:  0     Return if symptoms worsen or fail to improve.   The entirety of the information documented in the History of Present Illness, Review of Systems and Physical Exam were personally obtained by me. Portions of this information were initially documented by Tiburcio Pea, CMA and reviewed by me for thoroughness and accuracy.    Christina Crews, MD, MPH [facility] 07/23/2018 4:04 PM

## 2018-07-23 NOTE — Patient Instructions (Signed)
Bacterial Conjunctivitis, Adult  Bacterial conjunctivitis is an infection of your conjunctiva. This is the clear membrane that covers the white part of your eye and the inner part of your eyelid. This infection can make your eye:  · Red or pink.  · Itchy.  This condition spreads easily from person to person (is contagious) and from one eye to the other eye.  What are the causes?  · This condition is caused by germs (bacteria). You may get the infection if you come into close contact with:  ? A person who has the infection.  ? Items that have germs on them (are contaminated), such as face towels, contact lens solution, or eye makeup.  What increases the risk?  You are more likely to get this condition if you:  · Have contact with people who have the infection.  · Wear contact lenses.  · Have a sinus infection.  · Have had a recent eye injury or surgery.  · Have a weak body defense system (immune system).  · Have dry eyes.  What are the signs or symptoms?    · Thick, yellowish discharge from the eye.  · Tearing or watery eyes.  · Itchy eyes.  · Burning feeling in your eyes.  · Eye redness.  · Swollen eyelids.  · Blurred vision.  How is this treated?    · Antibiotic eye drops or ointment.  · Antibiotic medicine taken by mouth. This is used for infections that do not get better with drops or ointment or that last more than 10 days.  · Cool, wet cloths placed on the eyes.  · Artificial tears used 2-6 times a day.  Follow these instructions at home:  Medicines  · Take or apply your antibiotic medicine as told by your doctor. Do not stop taking or applying the antibiotic even if you start to feel better.  · Take or apply over-the-counter and prescription medicines only as told by your doctor.  · Do not touch your eyelid with the eye-drop bottle or the ointment tube.  Managing discomfort  · Wipe any fluid from your eye with a warm, wet washcloth or a cotton ball.  · Place a clean, cool, wet cloth on your eye. Do this for  10-20 minutes, 3-4 times per day.  General instructions  · Do not wear contacts until the infection is gone. Wear glasses until your doctor says it is okay to wear contacts again.  · Do not wear eye makeup until the infection is gone. Throw away old eye makeup.  · Change or wash your pillowcase every day.  · Do not share towels or washcloths.  · Wash your hands often with soap and water. Use paper towels to dry your hands.  · Do not touch or rub your eyes.  · Do not drive or use heavy machinery if your vision is blurred.  Contact a doctor if:  · You have a fever.  · You do not get better after 10 days.  Get help right away if:  · You have a fever and your symptoms get worse all of a sudden.  · You have very bad pain when you move your eye.  · Your face:  ? Hurts.  ? Is red.  ? Is swollen.  · You have sudden loss of vision.  Summary  · Bacterial conjunctivitis is an infection of your conjunctiva.  · This infection spreads easily from person to person.  · Wash your hands often   with soap and water. Use paper towels to dry your hands.  · Take or apply your antibiotic medicine as told by your doctor.  · Contact a doctor if you have a fever or you do not get better after 10 days.  This information is not intended to replace advice given to you by your health care provider. Make sure you discuss any questions you have with your health care provider.  Document Released: 12/18/2007 Document Revised: 10/14/2017 Document Reviewed: 10/14/2017  Elsevier Interactive Patient Education © 2019 [employer]

## 2018-09-11 ENCOUNTER — Emergency Department
Admission: EM | Admit: 2018-09-11 | Discharge: 2018-09-12 | Disposition: A | Discharge status: Home / Self Care | Payer: Medicare Other | Attending: Emergency Medicine | Admitting: Emergency Medicine

## 2018-09-11 ENCOUNTER — Other Ambulatory Visit: Payer: Self-pay

## 2018-09-11 ENCOUNTER — Emergency Department: Payer: Medicare Other

## 2018-09-11 DIAGNOSIS — R001 Bradycardia, unspecified: Secondary | ICD-10-CM | POA: Diagnosis not present

## 2018-09-11 DIAGNOSIS — R079 Chest pain, unspecified: Secondary | ICD-10-CM | POA: Diagnosis not present

## 2018-09-11 DIAGNOSIS — K859 Acute pancreatitis without necrosis or infection, unspecified: Secondary | ICD-10-CM | POA: Diagnosis not present

## 2018-09-11 DIAGNOSIS — I10 Essential (primary) hypertension: Secondary | ICD-10-CM | POA: Diagnosis not present

## 2018-09-11 DIAGNOSIS — Z79899 Other long term (current) drug therapy: Secondary | ICD-10-CM | POA: Insufficient documentation

## 2018-09-11 DIAGNOSIS — R0789 Other chest pain: Secondary | ICD-10-CM | POA: Diagnosis not present

## 2018-09-11 DIAGNOSIS — R0602 Shortness of breath: Secondary | ICD-10-CM | POA: Diagnosis not present

## 2018-09-11 DIAGNOSIS — E039 Hypothyroidism, unspecified: Secondary | ICD-10-CM | POA: Diagnosis not present

## 2018-09-11 LAB — CBC
HCT: 41.1 % (ref 36.0–46.0)
Hemoglobin: 13.3 g/dL (ref 12.0–15.0)
MCH: 28.1 pg (ref 26.0–34.0)
MCHC: 32.4 g/dL (ref 30.0–36.0)
MCV: 86.7 fL (ref 80.0–100.0)
Platelets: 263 10*3/uL (ref 150–400)
RBC: 4.74 MIL/uL (ref 3.87–5.11)
RDW: 13.2 % (ref 11.5–15.5)
WBC: 9.8 10*3/uL (ref 4.0–10.5)
nRBC: 0 % (ref 0.0–0.2)

## 2018-09-11 LAB — BASIC METABOLIC PANEL
Anion gap: 12 (ref 5–15)
BUN: 28 mg/dL — ABNORMAL HIGH (ref 8–23)
CO2: 26 mmol/L (ref 22–32)
Calcium: 8.8 mg/dL — ABNORMAL LOW (ref 8.9–10.3)
Chloride: 95 mmol/L — ABNORMAL LOW (ref 98–111)
Creatinine, Ser: 1.03 mg/dL — ABNORMAL HIGH (ref 0.44–1.00)
GFR calc Af Amer: >60 mL/min (ref >60)
GFR calc non Af Amer: 57 mL/min — ABNORMAL LOW (ref >60)
Glucose, Bld: 176 mg/dL — ABNORMAL HIGH (ref 70–99)
Potassium: 3.3 mmol/L — ABNORMAL LOW (ref 3.5–5.1)
Sodium: 133 mmol/L — ABNORMAL LOW (ref 135–145)

## 2018-09-11 LAB — TROPONIN I: Troponin I: <0.03 ng/mL (ref <0.03)

## 2018-09-11 LAB — BRAIN NATRIURETIC PEPTIDE: B Natriuretic Peptide: 129 pg/mL — ABNORMAL HIGH (ref 0.0–100.0)

## 2018-09-11 NOTE — Discharge Instructions (Addendum)

## 2018-09-11 NOTE — ED Triage Notes (Signed)
Pt comes via [facility] starting after eating dinner. Pressure in center of chest. Pt reports spitting up some phlem as well as belching. Pt had 324mg  aspirin.

## 2018-09-11 NOTE — ED Provider Notes (Signed)
[facility] Emergency Department Provider Note ____________________________________________   First MD Initiated Contact with Patient 09/11/18 2115     (approximate)  I have reviewed the triage vital signs and the nursing notes.   HISTORY  Chief Complaint Chest Pain  HPI Christina Rubio is a 68 y.o. female here for evaluation of chest pain  Patient reports just after eating steak about she female but she was doing well and also she began to have a severe pressure in her chest.  It lasted for about 45 minutes and got better after taking aspirin with EMS.  She does not have a history of heart disease.  She reports it was a severe pressure feeling across the middle of her chest did not seem to radiate but she did feel sweaty hot and for a little bit felt slightly short of breath though felt this may be related more to be very anxious about the pain.  She had some nausea but no vomiting.  She denies that it hurts in her stomach.  Denies abdominal pain.  She does note a known history of heart disease but has high blood pressure high cholesterol and is overweight.   She does not have a strong family history of young people with heart disease  A 68 year old patient with a history of hypertension, hypercholesterolemia and obesity presents for evaluation of chest pain. Initial onset of pain was less than one hour ago. The patient's chest pain is described as heaviness/pressure/tightness and is not worse with exertion. The patient complains of nausea and reports some diaphoresis. The patient's chest pain is middle- or left-sided, is not well-localized, is not sharp and does not radiate to the arms/jaw/neck. The patient has no history of stroke, has no history of peripheral artery disease, has not smoked in the past 90 days, denies any history of treated diabetes and has no relevant family history of coronary artery disease (first degree relative at less than age 68).   Past  Medical History:  Diagnosis Date  . Anxiety   . Arthritis   . Depression   . Hypertension   . Neuromuscular disorder ([place])   . Osteoporosis   . PONV (postoperative nausea and vomiting)   . Skin cancer    non melanoma  . Spinal stenosis   . Thyroid disease    hyperthyroid s/p partial thyroidectomy    Patient Active Problem List   Diagnosis Date Noted  . GAD (generalized anxiety disorder) 05/24/2018  . Hyperglycemia 05/24/2018  . Venous stasis 07/28/2017  . Spinal stenosis 05/28/2017  . DJD (degenerative joint disease) 05/28/2017  . Essential hypertension, benign 02/10/2016  . Dyslipidemia 02/10/2016  . MDD (major depressive disorder) 02/10/2016  . Hypothyroidism due to acquired atrophy of thyroid 02/10/2016  . Morbid obesity ([place]) 01/09/2016    Past Surgical History:  Procedure Laterality Date  . CESAREAN SECTION    . CHOLECYSTECTOMY    . COLON SURGERY    . FEMUR IM NAIL Right 01/09/2016   Procedure: INTRAMEDULLARY (IM) RETROGRADE FEMORAL NAILING RIGHT DISTAL FEMUR;  Surgeon: Dorna Leitz, MD;  Location: [facility];  Service: Orthopedics;  Laterality: Right;  . LAPAROSCOPIC GASTRIC SLEEVE RESECTION    . Right thyroidectomy    . TONSILLECTOMY      Prior to Admission medications   Medication Sig Start Date End Date Taking? Authorizing Provider  ALPRAZolam Duanne Moron) 0.5 MG tablet Take 1 tablet (0.5 mg total) by mouth daily as needed. for anxiety 05/24/18  Yes Bacigalupo, Dionne Bucy,  MD  atorvastatin (LIPITOR) 10 MG tablet Take 1 tablet (10 mg total) by mouth at bedtime. 05/21/18  Yes Bacigalupo, Dionne Bucy, MD  levothyroxine (SYNTHROID, LEVOTHROID) 75 MCG tablet Take 1 tablet (75 mcg total) by mouth at bedtime. 05/26/18  Yes Bacigalupo, Dionne Bucy, MD  metoprolol succinate (TOPROL-XL) 100 MG 24 hr tablet Take 1 tablet (100 mg total) by mouth at bedtime. 05/21/18  Yes Bacigalupo, Dionne Bucy, MD  Multiple Vitamins-Minerals (PRESERVISION AREDS 2) CAPS Take 1 tablet by mouth 2 (two) times daily.    Yes [provider]  sertraline (ZOLOFT) 100 MG tablet Take 1.5 tablets (150 mg total) by mouth daily. 05/21/18  Yes Bacigalupo, Dionne Bucy, MD  valsartan-hydrochlorothiazide (DIOVAN-HCT) 160-25 MG tablet Take 1 tablet by mouth at bedtime. 05/21/18  Yes Bacigalupo, Dionne Bucy, MD    Allergies Epinephrine, Penicillins, and Strawberry extract  Family History  Problem Relation Age of Onset  . Lung cancer Mother   . Prostate cancer Father   . Depression Daughter   . Anemia Daughter        PNH  . Thyroid cancer Maternal Uncle   . Lung cancer Paternal Uncle   . Stroke Maternal Grandmother   . Stroke Maternal Grandfather   . Stroke Paternal Grandmother   . Healthy Sister   . Healthy Brother   . Healthy Sister   . Emphysema Paternal Grandfather   . Colon cancer Neg Hx   . Breast cancer Neg Hx   . Ovarian cancer Neg Hx   . Cervical cancer Neg Hx     Social History Social History   Tobacco Use  . Smoking status: Never Smoker  . Smokeless tobacco: Never Used  Substance Use Topics  . Alcohol use: Yes    Comment: 1-2 drinks per year  . Drug use: No    Review of Systems Constitutional: No fever/chills Eyes: No visual changes. ENT: No sore throat. Cardiovascular: See HPI Respiratory: Slightly short of breath during the episode of pain, resolved now. Gastrointestinal: No abdominal pain.  Some nausea no vomiting. Genitourinary: Negative for dysuria. Musculoskeletal: Negative for back pain. Skin: Negative for rash. Neurological: Negative for headaches, areas of focal weakness or numbness.  Patient reports most of her symptoms are much better now.  ____________________________________________   PHYSICAL EXAM:  VITAL SIGNS: ED Triage Vitals  Enc Vitals Group     BP 09/11/18 2044 (!) 127/56     Pulse Rate 09/11/18 2044 (!) 49     Resp 09/11/18 2044 16     Temp 09/11/18 2044 98.5 F (36.9 C)     Temp Source 09/11/18 2044 Oral     SpO2 09/11/18 2044 98 %     Weight  09/11/18 2042 (!) 380 lb (172.4 kg)     Height 09/11/18 2042 5\' 4"  (1.626 m)     Head Circumference --      Peak Flow --      Pain Score 09/11/18 2042 3     Pain Loc --      Pain Edu? --      Excl. in [place]? --     Constitutional: Alert and oriented. Well appearing and in no acute distress. Eyes: Conjunctivae are normal. Head: Atraumatic. Nose: No congestion/rhinnorhea. Mouth/Throat: Mucous membranes are moist. Neck: No stridor.  Cardiovascular: Normal rate, regular rhythm. Grossly normal heart sounds.  Good peripheral circulation. Respiratory: Normal respiratory effort.  No retractions. Lungs CTAB. Gastrointestinal: Soft and nontender. No distention.  Morbid obesity noted. Musculoskeletal: No lower  extremity tenderness nor edema. Neurologic:  Normal speech and language. No gross focal neurologic deficits are appreciated.  Skin:  Skin is warm, dry and intact. No rash noted. Psychiatric: Mood and affect are normal. Speech and behavior are normal.  ____________________________________________   LABS (all labs ordered are listed, but only abnormal results are displayed)  Labs Reviewed  BASIC METABOLIC PANEL - Abnormal; Notable for the following components:      Result Value   Sodium 133 (*)    Potassium 3.3 (*)    Chloride 95 (*)    Glucose, Bld 176 (*)    BUN 28 (*)    Creatinine, Ser 1.03 (*)    Calcium 8.8 (*)    GFR calc non Af Amer 57 (*)    All other components within normal limits  CBC  TROPONIN I  HEPATIC FUNCTION PANEL  LIPASE, BLOOD  TROPONIN I  BRAIN NATRIURETIC PEPTIDE   ____________________________________________  EKG  ED ECG REPORT I, [name], the attending physician, personally viewed and interpreted this ECG.  Date: 09/11/2018 EKG Time: 2053 Rate: 50 Rhythm: Sinus bradycardia QRS Axis: normal Intervals: normal ST/T Wave abnormalities: normal Narrative Interpretation: no evidence of acute ischemia   ____________________________________________  RADIOLOGY  Dg Chest Port 1 View  Result Date: 09/11/2018 CLINICAL DATA:  Chest pain EXAM: PORTABLE CHEST 1 VIEW COMPARISON:  01/08/2016 FINDINGS: Cardiomegaly with mild central congestion. No focal consolidation or effusion. Aortic atherosclerosis. No pneumothorax. IMPRESSION: Cardiomegaly with mild central congestion Electronically Signed   By: Donavan Foil M.D.   On: 09/11/2018 21:30    Chest x-ray reviewed, notable for cardiomegaly and some slight central congestion ____________________________________________   PROCEDURES  Procedure(s) performed: None  Procedures  Critical Care performed: No  ____________________________________________   INITIAL IMPRESSION / ASSESSMENT AND PLAN / ED COURSE  Pertinent labs & imaging results that were available during my care of the patient were reviewed by me and considered in my medical decision making (see chart for details).   Differential diagnosis includes, but is not limited to, ACS, aortic dissection, pulmonary embolism, cardiac tamponade, pneumothorax, pneumonia, pericarditis, myocarditis, GI-related causes including esophagitis/gastritis, and musculoskeletal chest wall pain.  We will etiology also considered given its post prandial nature, but she denies abdominal pain has a history of a cholecystectomy, reassuring exam with no pain on abdominal exam.  Her symptoms are now resolved.    Clinical Course as of Sep 11 2310  Sat Sep 11, 2018  2057 EKG is reviewed entered by me at 2053Heart rate 50QRS 100QTc 440Sinus bradycardia without ischemic changes   [MQ]    Clinical Course User Index [MQ] [name], MD   ----------------------------------------- 11:07 PM on 09/11/2018 -----------------------------------------  Possible cardiomegaly on x-ray.  No infiltrates.  Patient currently pain-free.  Discussed options including observation in the hospital for further heart testing and to  make sure she does not have any signs of developing a "heart attack" versus close outpatient follow-up with careful return precautions and a second troponin here.  Patient strongly preference is to be able go home if her second heart test is negative.  I think this is reasonable.  She also reports a daughter with severe aplastic anemia, who she also helps and cares for.  I think this is reasonable, and if her second troponin is negative we will plan to discharge her as long as she remains pain-free with the baby aspirin regimen and close cardiology follow-up.  I discussed her case care EKG and symptoms with Dr.  McAlhany, he will assist in assuring close follow-up  ----------------------------------------- 12:28 AM on 09/12/2018 -----------------------------------------  Ongoing care signed Dr. Alfred Levins.  Follow-up on second troponin.  If negative would anticipate discharge and follow-up closely with cardiology.  LFTs also pending ____________________________________________   FINAL CLINICAL IMPRESSION(S) / ED DIAGNOSES  Final diagnoses:  Chest pain, unspecified type  Chest pain with moderate risk of acute coronary syndrome        Note:  This document was prepared using Dragon voice recognition software and may include unintentional dictation errors       [name], MD 09/12/18 [phone]

## 2018-09-12 LAB — TROPONIN I: Troponin I: <0.03 ng/mL (ref <0.03)

## 2018-09-12 LAB — HEPATIC FUNCTION PANEL
ALT: 16 U/L (ref 0–44)
AST: 22 U/L (ref 15–41)
Albumin: 4 g/dL (ref 3.5–5.0)
Alkaline Phosphatase: 38 U/L (ref 38–126)
Bilirubin, Direct: 0.1 mg/dL (ref 0.0–0.2)
Indirect Bilirubin: 0.4 mg/dL (ref 0.3–0.9)
Total Bilirubin: 0.5 mg/dL (ref 0.3–1.2)
Total Protein: 7.6 g/dL (ref 6.5–8.1)

## 2018-09-12 LAB — LIPASE, BLOOD: Lipase: 69 U/L — ABNORMAL HIGH (ref 11–51)

## 2018-09-12 MED ORDER — ONDANSETRON 4 MG PO TBDP: 4.0000 mg | ORAL_TABLET | Freq: Three times a day (TID) | ORAL | 0 refills | Status: DC | PRN

## 2018-09-12 NOTE — ED Provider Notes (Signed)
-----------------------------------------   2:37 AM on 09/12/2018 -----------------------------------------   Blood pressure (!) 125/36, pulse (!) 47, temperature 98.5 F (36.9 C), temperature source Oral, resp. rate 14, height 5\' 4"  (1.626 m), weight (!) 172.4 kg, SpO2 98 %.  Assuming care from Dr. Jacqualine Code of Christina Rubio is a 68 y.o. female with a chief complaint of Chest Pain .    Please refer to H&P by previous MD for further details.  The current plan of care is to f/u 2nd trop, LFTs, lipase and reassess.  On reevaluation, patient describes just a mild soreness in her epigastric region and no further episodes of chest pain.  She reports that the pain was much more pronounced in her chest when she came to the emergency room.  Her second troponin is negative.  Her lipase slightly elevated and she does have mild tenderness in the epigastric region.  Possibly mild pancreatitis.  Her gallbladder has been removed in the past therefore low suspicion for gallstone pancreatitis.  Her abdomen is otherwise benign.  Per Ranson criteria patient is low risk.  Her x-ray and labs are concerning for cardiomegaly with mildly elevated BNP.  She has no history of heart failure.  She is being referred to cardiology for outpatient monitoring.  Discussed bland diet, increase oral hydration, and return precautions for recurrence of her chest pain, worsening abdominal pain, fever or vomiting.  Otherwise she will follow-up with her PCP.   Alfred Levins, [place], MD 09/12/18 [phone]

## 2018-09-13 NOTE — Telephone Encounter (Signed)
-----   Message from Burnell Blanks, MD sent at 09/12/2018  9:55 AM EDT ----- This patient was seen in the ED at [facility] 09/11/18 and for chest pain and will need to be seen as a new patient in the [facility] office within the next week if possible.   Thanks,  Macy Mis, MD 09/12/2018 9:56 AM

## 2018-09-13 NOTE — Telephone Encounter (Signed)
This encounter was created in error - please disregard.

## 2018-09-15 ENCOUNTER — Ambulatory Visit: Payer: BLUE CROSS/BLUE SHIELD | Admitting: Internal Medicine

## 2018-09-21 ENCOUNTER — Telehealth: Payer: Self-pay

## 2018-09-21 NOTE — Telephone Encounter (Signed)

## 2018-09-21 NOTE — Progress Notes (Signed)
New Outpatient Visit Date: 09/22/2018  Referring Provider: [name], MD [address] [place],  [place] [address]  Chief Complaint: Chest pain  HPI:  Christina Rubio is a 68 y.o. female who is being seen today for the evaluation of chest pain at the request of Dr. Jacqualine Code. She has a history of hypertension, hyperlipidemia, and morbid obesity.  She presented to the [facility] emergency department on 09/11/2018 after developing severe chest pain shortly after eating a steak.  Workup in the ED was largely unrevealing, with negative troponin I x 2.  EKG showed sinus bradycardia but was otherwise normal.  BNP was noted to be mildly elevated at 129.  Lipase was also mildly elevated at 69.  Today, the [name] reports that she has been feeling well other than the aforementioned episode of chest pain.  She recounts today that she was eating rice and steak and suddenly had acute onset of central chest pain that was severe and lasted for about an hour.  She was not frankly short of breath but notes that it was painful to breathe.  She is not sure what caused the pain to improve, though she did receive 4 baby aspirin by EMS, which may have helped somewhat.  She has never had anything like this before.  She did not feel nauseated at the time but reports feeling as if she needed to spit up and "foam at the mouth."  [name] denies a history of prior cardiac disease other than a single episode of profound fatigue that occurred in the setting of a low heart rate several years ago.  A blood pressure medication was discontinued at that time and she has otherwise felt well.  However, she notes that her heart rate has recently been low.  She is currently undergoing titration of her thyroid medication due to elevated TSH on last check in March.  [name] states that she underwent some sort of a cardiac screening test about 15 years ago and was told that it looked 99% normal.  She denies orthopnea, PND,  palpitations, lightheadedness, and edema.  --------------------------------------------------------------------------------------------------  Cardiovascular History & Procedures: Cardiovascular Problems:  Chest pain  Risk Factors:  Hypertension, hyperlipidemia, morbid obesity, and age > 60  Cath/PCI:  None  CV Surgery:  None  EP Procedures and Devices:  None  Non-Invasive Evaluation(s):  None  Recent CV Pertinent Labs: Lab Results  Component Value Date   CHOL 170 05/24/2018   HDL 42 05/24/2018   LDLCALC 96 05/24/2018   TRIG 161 (H) 05/24/2018   CHOLHDL 4.0 05/24/2018   BNP 129.0 (H) 09/11/2018   K 3.3 (L) 09/11/2018   BUN 28 (H) 09/11/2018   BUN 15 05/24/2018   CREATININE 1.03 (H) 09/11/2018    --------------------------------------------------------------------------------------------------  Past Medical History:  Diagnosis Date  . Anxiety   . Arthritis   . Depression   . Hyperlipidemia   . Hypertension   . Neuromuscular disorder ([place])   . Osteoporosis   . PONV (postoperative nausea and vomiting)   . Skin cancer    non melanoma  . Spinal stenosis   . Thyroid disease    hyperthyroid s/p partial thyroidectomy    Past Surgical History:  Procedure Laterality Date  . CESAREAN SECTION    . CHOLECYSTECTOMY    . COLON SURGERY    . FEMUR IM NAIL Right 01/09/2016   Procedure: INTRAMEDULLARY (IM) RETROGRADE FEMORAL NAILING RIGHT DISTAL FEMUR;  Surgeon: Dorna Leitz, MD;  Location: [facility];  Service: Orthopedics;  Laterality: Right;  . LAPAROSCOPIC GASTRIC SLEEVE RESECTION    . Right thyroidectomy    . TONSILLECTOMY      Current Meds  Medication Sig  . ALPRAZolam (XANAX) 0.5 MG tablet Take 1 tablet (0.5 mg total) by mouth daily as needed. for anxiety  . atorvastatin (LIPITOR) 10 MG tablet Take 1 tablet (10 mg total) by mouth at bedtime.  Marland Kitchen levothyroxine (SYNTHROID, LEVOTHROID) 75 MCG tablet Take 1 tablet (75 mcg total) by mouth at bedtime.  .  Multiple Vitamins-Minerals (PRESERVISION AREDS 2) CAPS Take 1 tablet by mouth 2 (two) times daily.  . ondansetron (ZOFRAN ODT) 4 MG disintegrating tablet Take 1 tablet (4 mg total) by mouth every 8 (eight) hours as needed.  . Probiotic Product (PROBIOTIC PO) Take by mouth 2 (two) times a day.  . sertraline (ZOLOFT) 100 MG tablet Take 1.5 tablets (150 mg total) by mouth daily.  . valsartan-hydrochlorothiazide (DIOVAN-HCT) 160-25 MG tablet Take 1 tablet by mouth at bedtime.  . [DISCONTINUED] metoprolol succinate (TOPROL-XL) 100 MG 24 hr tablet Take 1 tablet (100 mg total) by mouth at bedtime.    Allergies: Epinephrine, Penicillins, and Strawberry extract  Social History   Tobacco Use  . Smoking status: Never Smoker  . Smokeless tobacco: Never Used  Substance Use Topics  . Alcohol use: Yes    Comment: 1-2 drinks per year  . Drug use: No    Family History  Problem Relation Age of Onset  . Lung cancer Mother   . Prostate cancer Father   . Depression Daughter   . Anemia Daughter        PNH  . Thyroid cancer Maternal Uncle   . Lung cancer Paternal Uncle   . Stroke Maternal Grandmother   . Stroke Maternal Grandfather   . Stroke Paternal Grandmother   . Healthy Sister   . Healthy Brother   . Healthy Sister   . Emphysema Paternal Grandfather   . Colon cancer Neg Hx   . Breast cancer Neg Hx   . Ovarian cancer Neg Hx   . Cervical cancer Neg Hx     Review of Systems: A 12-system review of systems was performed and was negative except as noted in the HPI.  --------------------------------------------------------------------------------------------------  Physical Exam: BP 108/60 (BP Location: Right Arm, Patient Position: Sitting, Cuff Size: Large)   Pulse (!) 46   Ht 5\' 4"  (1.626 m)   Wt (!) 397 lb 12 oz (180.4 kg)   BMI 68.27 kg/m   General:  Morbidly obese, seated comfortably in wheelchair. HEENT: No conjunctival pallor or scleral icterus. Facemask in place. Neck: Supple  without lymphadenopathy, thyromegaly, JVD, or HJR. No carotid bruit. Lungs: Normal work of breathing. Clear to auscultation bilaterally without wheezes or crackles. Heart: Bradycardic but regular without murmurs, rubs, or gallops. Unable to assess PMI due to body habitus. Abd: Bowel sounds present. Soft, NT/ND without hepatosplenomegaly Ext: Trace pretibial edema. 1+ radial and pedal pulses. Skin: Warm and dry without rash. Neuro: CNIII-XII intact. Strength and fine-touch sensation intact in upper and lower extremities bilaterally. Psych: Normal mood and affect.  EKG:  Sinus bradycardia with PAC's and borderline LVH.  Lab Results  Component Value Date   WBC 9.8 09/11/2018   HGB 13.3 09/11/2018   HCT 41.1 09/11/2018   MCV 86.7 09/11/2018   PLT 263 09/11/2018    Lab Results  Component Value Date   NA 133 (L) 09/11/2018   K 3.3 (L) 09/11/2018   CL  95 (L) 09/11/2018   CO2 26 09/11/2018   BUN 28 (H) 09/11/2018   CREATININE 1.03 (H) 09/11/2018   GLUCOSE 176 (H) 09/11/2018   ALT 16 09/11/2018    Lab Results  Component Value Date   CHOL 170 05/24/2018   HDL 42 05/24/2018   LDLCALC 96 05/24/2018   TRIG 161 (H) 05/24/2018   CHOLHDL 4.0 05/24/2018    --------------------------------------------------------------------------------------------------  ASSESSMENT AND PLAN: Chest pain: Timing and description of chest pain is not consistent with coronary insufficiency.  I am most suspicious for esophageal spasm and/or food impaction.  Presentation is not consistent with acute pancreatitis, though lipase was noted to be mildly elevated.  Given that discomfort has only happened once, we have agreed to defer ischemia evaluation in favor of empiric trial of famotidine 20 mg twice daily.  If she has recurrent pain, I would favor pursuing a pharmacologic myocardial perfusion PET/CT scan at [facility], as I do not believe that noninvasive testing options at [facility] or [facility] would be diagnostic due to  the patient's morbid obesity.  Bradycardia: This is most likely a combination of excessive beta-blockade as well as hypothyroidism.  I have recommended decreasing metoprolol succinate to 50 mg nightly.  I will defer ongoing management of hypothyroidism to the patient's PCP.  Morbid obesity: Weight loss encouraged through diet and exercise.  Follow-up: Return to clinic in 3 months.  Nelva Bush, MD 09/22/2018 3:22 PM

## 2018-09-22 ENCOUNTER — Encounter: Payer: Self-pay | Admitting: Internal Medicine

## 2018-09-22 ENCOUNTER — Ambulatory Visit (INDEPENDENT_AMBULATORY_CARE_PROVIDER_SITE_OTHER): Payer: Medicare Other | Admitting: Internal Medicine

## 2018-09-22 ENCOUNTER — Other Ambulatory Visit: Payer: Self-pay

## 2018-09-22 VITALS — BP 108/60 | HR 46 | Ht 64.0 in | Wt 397.8 lb

## 2018-09-22 DIAGNOSIS — R079 Chest pain, unspecified: Secondary | ICD-10-CM | POA: Diagnosis not present

## 2018-09-22 DIAGNOSIS — R001 Bradycardia, unspecified: Secondary | ICD-10-CM | POA: Insufficient documentation

## 2018-09-22 MED ORDER — FAMOTIDINE 20 MG PO TABS: 20.0000 mg | ORAL_TABLET | Freq: Two times a day (BID) | ORAL | 6 refills | Status: DC

## 2018-09-22 MED ORDER — METOPROLOL SUCCINATE ER 50 MG PO TB24: 50.0000 mg | ORAL_TABLET | Freq: Every day | ORAL | 3 refills | Status: DC

## 2018-09-22 NOTE — Patient Instructions (Signed)
Medication Instructions:  Your physician has recommended you make the following change in your medication:  1- START Famotidine 20 mg by mouth two times a day. 2- DECREASE Metoprolol to 50 mg by mouth daily in the evening.  If you need a refill on your cardiac medications before your next appointment, please call your pharmacy.   Lab work: - None ordered.  If you have labs (blood work) drawn today and your tests are completely normal, you will receive your results only by: Marland Kitchen MyChart Message (if you have MyChart) OR . A paper copy in the mail If you have any lab test that is abnormal or we need to change your treatment, we will call you to review the results.  Testing/Procedures: - None ordered.   Follow-Up: At [facility], you and your health needs are our priority.  As part of our continuing mission to provide you with exceptional heart care, we have created designated Provider Care Teams.  These Care Teams include your primary Cardiologist (physician) and Advanced Practice Providers (APPs -  Physician Assistants and Nurse Practitioners) who all work together to provide you with the care you need, when you need it. You will need a follow up appointment in 3 months.  Please call our office 2 months in advance to schedule this appointment.  You may see DR Harrell Gave END or one of the following Advanced Practice Providers on your designated Care Team:   Murray Hodgkins, NP Christell Faith, PA-C . Marrianne Mood, PA-C

## 2018-11-22 ENCOUNTER — Telehealth: Payer: Self-pay

## 2018-11-22 MED ORDER — ALPRAZOLAM 0.5 MG PO TABS: 0.5000 mg | ORAL_TABLET | Freq: Every day | ORAL | 0 refills | Status: DC | PRN

## 2018-11-22 NOTE — Telephone Encounter (Signed)
Patient is requesting a refill on Xanax. She wanted to let you know that her daughter is in the hospital because her bone marrow transplant failed. Patient states that she is not doing well.

## 2018-11-22 NOTE — Telephone Encounter (Signed)
Rx sent 

## 2018-11-24 ENCOUNTER — Ambulatory Visit: Payer: Self-pay | Admitting: Family Medicine

## 2018-12-31 ENCOUNTER — Ambulatory Visit: Payer: Medicare Other | Admitting: Family Medicine

## 2019-01-03 ENCOUNTER — Ambulatory Visit: Payer: Self-pay | Admitting: Family Medicine

## 2019-01-03 ENCOUNTER — Telehealth: Payer: Self-pay

## 2019-01-03 NOTE — Telephone Encounter (Signed)
Patient called to canceled appt. She reports that her pipes burst out and now she needs to fix that so she is not able to make to the appt this afternoon. She is also requesting a refill on the following medication ALPRAZolam Duanne Moron) 0.5 MG tablet  [facility]

## 2019-01-03 NOTE — Progress Notes (Deleted)
Patient: Christina Rubio Female    DOB: Aug 10, 1950   68 y.o.   MRN: [id number] Visit Date: 01/03/2019  Today's Provider: Lavon Paganini, MD   No chief complaint on file.  Subjective:     HPI  Hypertension, follow-up:  BP Readings from Last 3 Encounters:  09/22/18 108/60  09/12/18 (!) 132/57  05/24/18 132/86    She was last seen for hypertension 7 months ago.  BP at that visit was 132/86. Management changes since that visit include continue medications. She reports good compliance with treatment. She is not having side effects.  She {is/is not:9024} exercising. She {is/is not:9024} adherent to low salt diet.   Outside blood pressures are not being checked . She is experiencing none.  Patient denies chest pain, chest pressure/discomfort, claudication, dyspnea, exertional chest pressure/discomfort, fatigue, irregular heart beat, lower extremity edema, near-syncope, orthopnea, palpitations, paroxysmal nocturnal dyspnea, syncope and tachypnea.   Cardiovascular risk factors include advanced age (older than 39 for men, 28 for women), dyslipidemia, hypertension and female gender.  Use of agents associated with hypertension: thyroid hormones.     Weight trend: stable Wt Readings from Last 3 Encounters:  09/22/18 (!) 397 lb 12 oz (180.4 kg)  09/11/18 (!) 380 lb (172.4 kg)  05/24/18 (!) 388 lb (176 kg)   ------------------------------------------------------------------------  Lipid/Cholesterol, Follow-up:   Last seen for this 7 months ago.  Management changes since that visit include continue medications. . Last Lipid Panel:    Component Value Date/Time   CHOL 170 05/24/2018 1153   TRIG 161 (H) 05/24/2018 1153   HDL 42 05/24/2018 1153   CHOLHDL 4.0 05/24/2018 1153   [place] 96 05/24/2018 1153    Risk factors for vascular disease include hypercholesterolemia and hypertension  She reports good compliance with treatment. She is not having side effects.   Current symptoms include none  Weight trend: stable Prior visit with dietician: {yes/no:17258} Current diet: {diet habits:16563} Current exercise: {exercise types:16438}  Wt Readings from Last 3 Encounters:  09/22/18 (!) 397 lb 12 oz (180.4 kg)  09/11/18 (!) 380 lb (172.4 kg)  05/24/18 (!) 388 lb (176 kg)    -------------------------------------------------------------------  Hypothyroid, follow-up:  TSH  Date Value Ref Range Status  05/24/2018 6.820 (H) 0.450 - 4.500 uIU/mL Final  05/28/2017 4.430 0.450 - 4.500 uIU/mL Final  02/13/2016 3.95 0.41 - 5.90 uIU/mL Final   Wt Readings from Last 3 Encounters:  09/22/18 (!) 397 lb 12 oz (180.4 kg)  09/11/18 (!) 380 lb (172.4 kg)  05/24/18 (!) 388 lb (176 kg)    She was last seen for hypothyroid 7 months ago.  Management since that visit includes no change. She reports good compliance with treatment. She is not having side effects.  She {is/is not:9024} exercising. She is experiencing none She denies change in energy level, diarrhea, heat / cold intolerance, nervousness, palpitations and weight changes Weight trend: stable  ------------------------------------------------------------------------  Allergies  Allergen Reactions  . Epinephrine Palpitations    faint  . Penicillins Rash    Has patient had a PCN reaction causing immediate rash, facial/tongue/throat swelling, SOB or lightheadedness with hypotension:  Yes Has patient had a PCN reaction causing severe rash involving mucus membranes or skin necrosis: No Has patient had a PCN reaction that required hospitalization: No Has patient had a PCN reaction occurring within the last 10 years: No If all of the above answers are "NO", then may proceed with Cephalosporin use.;  . Strawberry Extract Rash  Current Outpatient Medications:  .  ALPRAZolam (XANAX) 0.5 MG tablet, Take 1 tablet (0.5 mg total) by mouth daily as needed. for anxiety, Disp: 30 tablet, Rfl: 0 .   atorvastatin (LIPITOR) 10 MG tablet, Take 1 tablet (10 mg total) by mouth at bedtime., Disp: 90 tablet, Rfl: 3 .  famotidine (PEPCID) 20 MG tablet, Take 1 tablet (20 mg total) by mouth 2 (two) times daily., Disp: 60 tablet, Rfl: 6 .  levothyroxine (SYNTHROID, LEVOTHROID) 75 MCG tablet, Take 1 tablet (75 mcg total) by mouth at bedtime., Disp: 90 tablet, Rfl: 3 .  metoprolol succinate (TOPROL-XL) 50 MG 24 hr tablet, Take 1 tablet (50 mg total) by mouth daily. Take with or immediately following a meal., Disp: 90 tablet, Rfl: 3 .  Multiple Vitamins-Minerals (PRESERVISION AREDS 2) CAPS, Take 1 tablet by mouth 2 (two) times daily., Disp: , Rfl:  .  ondansetron (ZOFRAN ODT) 4 MG disintegrating tablet, Take 1 tablet (4 mg total) by mouth every 8 (eight) hours as needed., Disp: 20 tablet, Rfl: 0 .  Probiotic Product (PROBIOTIC PO), Take by mouth 2 (two) times a day., Disp: , Rfl:  .  sertraline (ZOLOFT) 100 MG tablet, Take 1.5 tablets (150 mg total) by mouth daily., Disp: 135 tablet, Rfl: 3 .  valsartan-hydrochlorothiazide (DIOVAN-HCT) 160-25 MG tablet, Take 1 tablet by mouth at bedtime., Disp: 90 tablet, Rfl: 3  Review of Systems  Constitutional: Negative.   Respiratory: Negative.   Cardiovascular: Negative.   Musculoskeletal: Negative.     Social History   Tobacco Use  . Smoking status: Never Smoker  . Smokeless tobacco: Never Used  Substance Use Topics  . Alcohol use: Yes    Comment: 1-2 drinks per year      Objective:   There were no vitals taken for this visit. There were no vitals filed for this visit.There is no height or weight on file to calculate BMI.   Physical Exam   No results found for any visits on 01/03/19.     Assessment & Plan        Lavon Paganini, MD  [facility]

## 2019-01-04 MED ORDER — ALPRAZOLAM 0.5 MG PO TABS: 0.5000 mg | ORAL_TABLET | Freq: Every day | ORAL | 0 refills | Status: DC | PRN

## 2019-01-04 NOTE — Telephone Encounter (Signed)
Rx sent 

## 2019-01-06 ENCOUNTER — Ambulatory Visit (INDEPENDENT_AMBULATORY_CARE_PROVIDER_SITE_OTHER): Payer: Medicare Other | Admitting: Family Medicine

## 2019-01-06 ENCOUNTER — Encounter: Payer: Self-pay | Admitting: Family Medicine

## 2019-01-06 ENCOUNTER — Other Ambulatory Visit: Payer: Self-pay

## 2019-01-06 DIAGNOSIS — Z23 Encounter for immunization: Secondary | ICD-10-CM

## 2019-01-06 NOTE — Progress Notes (Signed)
Patient here for flu vaccination only.  I did not examine the patient.  I did review his medical history, medications, and allergies and vaccine consent form.  CMA gave vaccination. Patient tolerated well.  [name], MD, MPH [facility] 01/06/2019 12:44 PM

## 2019-01-13 ENCOUNTER — Encounter: Payer: Self-pay | Admitting: Family Medicine

## 2019-01-13 ENCOUNTER — Ambulatory Visit (INDEPENDENT_AMBULATORY_CARE_PROVIDER_SITE_OTHER): Payer: Medicare Other | Admitting: Family Medicine

## 2019-01-13 VITALS — BP 114/73 | HR 52 | Wt 387.0 lb

## 2019-01-13 DIAGNOSIS — E785 Hyperlipidemia, unspecified: Secondary | ICD-10-CM | POA: Diagnosis not present

## 2019-01-13 DIAGNOSIS — R1310 Dysphagia, unspecified: Secondary | ICD-10-CM | POA: Diagnosis not present

## 2019-01-13 DIAGNOSIS — F411 Generalized anxiety disorder: Secondary | ICD-10-CM | POA: Diagnosis not present

## 2019-01-13 DIAGNOSIS — I10 Essential (primary) hypertension: Secondary | ICD-10-CM | POA: Diagnosis not present

## 2019-01-13 DIAGNOSIS — F332 Major depressive disorder, recurrent severe without psychotic features: Secondary | ICD-10-CM

## 2019-01-13 DIAGNOSIS — E034 Atrophy of thyroid (acquired): Secondary | ICD-10-CM | POA: Diagnosis not present

## 2019-01-13 DIAGNOSIS — R1319 Other dysphagia: Secondary | ICD-10-CM | POA: Insufficient documentation

## 2019-01-13 DIAGNOSIS — R7303 Prediabetes: Secondary | ICD-10-CM | POA: Diagnosis not present

## 2019-01-13 DIAGNOSIS — L608 Other nail disorders: Secondary | ICD-10-CM

## 2019-01-13 NOTE — Progress Notes (Addendum)
Patient: Christina Rubio Female    DOB: [birth year]   68 y.o.   MRN: [id number] Visit Date: 01/13/2019  Today's Provider: Lavon Paganini, MD   Chief Complaint  Patient presents with  . Anxiety    Worsening since daughter's illness  . Hypertension  . Hyperlipidemia   Subjective:    Virtual Visit via Video Note  I connected with Christina Rubio on 01/13/19 at  2:40 PM EDT by a video enabled telemedicine application and verified that I am speaking with the correct person using two identifiers.   Patient location: home Provider location: [place] involved in the visit: patient, provider    I discussed the limitations of evaluation and management by telemedicine and the availability of in person appointments. The patient expressed understanding and agreed to proceed.   HPI  States that daughter's illness has gotten worse and she was hospitalized for a long time.  This has worsened her anxiety.  She has increased Xanax use to about daily from 1-2 per week.  She is having difficulty sleeping at night due to racing thoughts and worry about her daughter.  The pandemic is also stressful for her.  She is still taking Zoloft 150mg  daily.  States that it is hard to tell if this is helping or not because circumstances are so stressful.  HTN: - Medications: Valsartan-HCTZ 160-25 mg, metoprolol 50mg  daily - Compliance: good - Checking BP at home: yes, well controlled on home readings - Denies any SOB, CP, vision changes, LE edema, medication SEs, or symptoms of hypotension - Diet: "bad" - Exercise: none   HLD - medications: atorvastatin 10mg  daily  - compliance: good - medication SEs: none   Hypothyroidism: Taking Synthroid 75 mcg daily with good compliance.  Denies any symptoms.  Dysphagia and feeling of something getting stuck as it is going down. Had episode of chest pain and called EMS.  She was taken to the emergency department.  This occurred on  09/11/2018.  She saw cardiology and was reassured that this was not a cardiac etiology.  She states that she is will still occasionally have pain and feels as though something is getting stuck as it is going down when she is swallowing.  This only occurs with solids.  She does not have any choking or sputtering while swallowing liquids.  She states that rice is the biggest culprit.  Depression screen [facility] 2/9 01/13/2019 05/11/2018 11/17/2017 07/28/2017 05/28/2017  Decreased Interest 2 2 2 1 1   Down, Depressed, Hopeless 1 2 3  0 1  PHQ - 2 Score 3 4 5 1 2   Altered sleeping 3 2 3 1 2   Tired, decreased energy 3 2 2 1 2   Change in appetite 3 2 2 3 1   Feeling bad or failure about yourself  1 1 1  0 0  Trouble concentrating 3 1 1  0 0  Moving slowly or fidgety/restless 3 0 1 0 0  Suicidal thoughts 0 0 0 0 0  PHQ-9 Score 19 12 15 6 7   Difficult doing work/chores Extremely dIfficult Somewhat difficult Very difficult Somewhat difficult Very difficult     GAD 7 : Generalized Anxiety Score 01/13/2019 11/17/2017 07/28/2017  Nervous, Anxious, on Edge 2 2 0  Control/stop worrying 3 3 2   Worry too much - different things 3 2 2   Trouble relaxing 3 3 1   Restless 0 1 0  Easily annoyed or irritable 1 2 1   Afraid - awful might happen 2 2 2  Total GAD 7 Score 14 15 8   Anxiety Difficulty Somewhat difficult Very difficult Somewhat difficult     Allergies  Allergen Reactions  . Epinephrine Palpitations    faint  . Penicillins Rash    Has patient had a PCN reaction causing immediate rash, facial/tongue/throat swelling, SOB or lightheadedness with hypotension:  Yes Has patient had a PCN reaction causing severe rash involving mucus membranes or skin necrosis: No Has patient had a PCN reaction that required hospitalization: No Has patient had a PCN reaction occurring within the last 10 years: No If all of the above answers are "NO", then may proceed with Cephalosporin use.;  . Strawberry Extract Rash     Current  Outpatient Medications:  .  ALPRAZolam (XANAX) 0.5 MG tablet, Take 1 tablet (0.5 mg total) by mouth daily as needed. for anxiety, Disp: 30 tablet, Rfl: 0 .  atorvastatin (LIPITOR) 10 MG tablet, Take 1 tablet (10 mg total) by mouth at bedtime., Disp: 90 tablet, Rfl: 3 .  famotidine (PEPCID) 20 MG tablet, Take 1 tablet (20 mg total) by mouth 2 (two) times daily., Disp: 60 tablet, Rfl: 6 .  levothyroxine (SYNTHROID, LEVOTHROID) 75 MCG tablet, Take 1 tablet (75 mcg total) by mouth at bedtime., Disp: 90 tablet, Rfl: 3 .  Multiple Vitamins-Minerals (PRESERVISION AREDS 2) CAPS, Take 1 tablet by mouth 2 (two) times daily., Disp: , Rfl:  .  ondansetron (ZOFRAN ODT) 4 MG disintegrating tablet, Take 1 tablet (4 mg total) by mouth every 8 (eight) hours as needed., Disp: 20 tablet, Rfl: 0 .  Probiotic Product (PROBIOTIC PO), Take by mouth 2 (two) times a day., Disp: , Rfl:  .  sertraline (ZOLOFT) 100 MG tablet, Take 1.5 tablets (150 mg total) by mouth daily., Disp: 135 tablet, Rfl: 3 .  valsartan-hydrochlorothiazide (DIOVAN-HCT) 160-25 MG tablet, Take 1 tablet by mouth at bedtime., Disp: 90 tablet, Rfl: 3 .  metoprolol succinate (TOPROL-XL) 50 MG 24 hr tablet, Take 1 tablet (50 mg total) by mouth daily. Take with or immediately following a meal., Disp: 90 tablet, Rfl: 3  Review of Systems  Constitutional: Negative.   HENT: Negative.   Respiratory: Negative.   Cardiovascular: Negative.   Gastrointestinal: Positive for nausea. Negative for abdominal distention, abdominal pain, anal bleeding, blood in stool, constipation, diarrhea, rectal pain and vomiting.  Skin: Negative.   Psychiatric/Behavioral: Positive for decreased concentration, dysphoric mood and sleep disturbance. Negative for hallucinations, self-injury and suicidal ideas. The patient is nervous/anxious.     Social History   Tobacco Use  . Smoking status: Never Smoker  . Smokeless tobacco: Never Used  Substance Use Topics  . Alcohol use: Yes     Comment: 1-2 drinks per year      Objective:   BP 114/73   Pulse (!) 52   Wt (!) 387 lb (175.5 kg)   BMI 66.43 kg/m  Vitals:   01/13/19 1422  BP: 114/73  Pulse: (!) 52  Weight: (!) 387 lb (175.5 kg)  Body mass index is 66.43 kg/m.   Physical Exam Constitutional:      General: She is not in acute distress.    Appearance: Normal appearance.  Pulmonary:     Effort: Pulmonary effort is normal. No respiratory distress.  Neurological:     Mental Status: She is alert and oriented to person, place, and time. Mental status is at baseline.  Psychiatric:        Mood and Affect: Mood is depressed. Affect is flat.  Speech: Speech is tangential.        Behavior: Behavior normal.        Thought Content: Thought content does not include homicidal or suicidal ideation. Thought content does not include homicidal or suicidal plan.      No results found for any visits on 01/13/19.     Assessment & Plan  I discussed the assessment and treatment plan with the patient. The patient was provided an opportunity to ask questions and all were answered. The patient agreed with the plan and demonstrated an understanding of the instructions.   The patient was advised to call back or seek an in-person evaluation if the symptoms worsen or if the condition fails to improve as anticipated.   Problem List Items Addressed This Visit      Cardiovascular and Mediastinum   Essential hypertension, benign - Primary    Well controlled on home readings Continue current medications Recheck metabolic panel F/u in 6 months         Digestive   Esophageal dysphagia    New problem Referral to GI        Endocrine   Hypothyroidism due to acquired atrophy of thyroid    Asymptomatic TSH was elevated on last check and dose was adjusted Continue Synthroid at current dose  Recheck TSH and adjust Synthroid as indicated          Musculoskeletal and Integument   Toenail deformity    Referral  to podiatry        Other   Morbid obesity ([place]) (Chronic)    Discussed importance of healthy weight management Discussed diet and exercise       Dyslipidemia    Continue lipitor Recheck CMP and FLP      MDD (major depressive disorder)    Chronic and uncontrolled Increase Zoloft as below Referral to psychiatry Encourage therapy Contracted for safety-no SI/HI      GAD (generalized anxiety disorder)    Chronic Uncontrolled Stressful situations are contributing to worsening of symptoms Increase Zoloft to 200mg  daily Continue Xanax prn - no more than once per day GAD7 and PHQ9 at next visit Referral to psychiatry Encourage therapy      Prediabetes    Recheck A1c Encouraged low carb diet          Return in about 6 months (around 07/14/2019) for AWV, chronic disease f/u.   The entirety of the information documented in the History of Present Illness, Review of Systems and Physical Exam were personally obtained by me. Portions of this information were initially documented by Ashley Royalty, CMA and reviewed by me for thoroughness and accuracy.    , Christina Bucy, MD MPH [facility]

## 2019-01-13 NOTE — Assessment & Plan Note (Signed)
Chronic and uncontrolled Increase Zoloft as below Referral to psychiatry Encourage therapy Contracted for safety-no SI/HI

## 2019-01-13 NOTE — Assessment & Plan Note (Signed)
Discussed importance of healthy weight management Discussed diet and exercise  

## 2019-01-13 NOTE — Assessment & Plan Note (Signed)
Well controlled on home readings Continue current medications Recheck metabolic panel F/u in 6 months  

## 2019-01-13 NOTE — Assessment & Plan Note (Addendum)
Recheck A1c Encouraged low carb diet

## 2019-01-13 NOTE — Assessment & Plan Note (Signed)
New problem Referral to GI

## 2019-01-13 NOTE — Assessment & Plan Note (Signed)
Continue lipitor Recheck CMP and FLP

## 2019-01-13 NOTE — Assessment & Plan Note (Signed)
Referral to podiatry  

## 2019-01-13 NOTE — Assessment & Plan Note (Addendum)
Chronic Uncontrolled Stressful situations are contributing to worsening of symptoms Increase Zoloft to 200mg  daily Continue Xanax prn - no more than once per day GAD7 and PHQ9 at next visit Referral to psychiatry Encourage therapy

## 2019-01-13 NOTE — Assessment & Plan Note (Signed)
Asymptomatic TSH was elevated on last check and dose was adjusted Continue Synthroid at current dose  Recheck TSH and adjust Synthroid as indicated

## 2019-02-01 ENCOUNTER — Other Ambulatory Visit: Payer: Self-pay | Admitting: Family Medicine

## 2019-02-01 MED ORDER — ALPRAZOLAM 0.5 MG PO TABS: 0.5000 mg | ORAL_TABLET | Freq: Every day | ORAL | 3 refills | Status: DC | PRN

## 2019-02-01 NOTE — Telephone Encounter (Signed)
Pt needing refills on:  ALPRAZolam Duanne Moron) 0.5 MG tablet  Please fill at:  [place], [place] [phone] (Phone) [phone] (Fax)   [place], [place]

## 2019-03-03 ENCOUNTER — Ambulatory Visit: Payer: Self-pay | Admitting: *Deleted

## 2019-03-03 NOTE — Progress Notes (Signed)
Patient: Christina Rubio Female    DOB: 07/14/1950   68 y.o.   MRN: [id number] Visit Date: 03/04/2019  Today's Provider: Lavon Paganini, MD   Chief Complaint  Patient presents with  . Rash  . Diarrhea   Subjective:    Virtual Visit via Telephone Note  I connected with Christina Rubio on 03/04/19 at 11:20 AM EST by telephone and verified that I am speaking with the correct person using two identifiers.  Location: Patient location: home Provider location: [facility] Persons involved in the visit: patient, provider    I discussed the limitations, risks, security and privacy concerns of performing an evaluation and management service by telephone and the availability of in person appointments. I also discussed with the patient that there may be a patient responsible charge related to this service. The patient expressed understanding and agreed to proceed.   Rash This is a new problem. The current episode started 1 to 4 weeks ago. The affected locations include the chest (Under breasts). The rash is characterized by dryness, pain, redness, itchiness and draining. She was exposed to nothing. Associated symptoms include diarrhea. Pertinent negatives include no vomiting.   She does not wear a bra daily as she stays at home due to the pandemic.  Started under R breast.  Used a barrier cream initially (Calavime). Then tried a powder that her daughter had for a similar rash (Nystatin).  Reports toenail fungus and needs to see a podiatrist. Requests referral.  Allergies  Allergen Reactions  . Epinephrine Palpitations    faint  . Penicillins Rash    Has patient had a PCN reaction causing immediate rash, facial/tongue/throat swelling, SOB or lightheadedness with hypotension:  Yes Has patient had a PCN reaction causing severe rash involving mucus membranes or skin necrosis: No Has patient had a PCN reaction that required hospitalization: No Has patient had a PCN  reaction occurring within the last 10 years: No If all of the above answers are "NO", then may proceed with Cephalosporin use.;  . Strawberry Extract Rash     Current Outpatient Medications:  .  ALPRAZolam (XANAX) 0.5 MG tablet, Take 1 tablet (0.5 mg total) by mouth daily as needed. for anxiety, Disp: 30 tablet, Rfl: 3 .  atorvastatin (LIPITOR) 10 MG tablet, Take 1 tablet (10 mg total) by mouth at bedtime., Disp: 90 tablet, Rfl: 3 .  famotidine (PEPCID) 20 MG tablet, Take 1 tablet (20 mg total) by mouth 2 (two) times daily., Disp: 60 tablet, Rfl: 6 .  levothyroxine (SYNTHROID, LEVOTHROID) 75 MCG tablet, Take 1 tablet (75 mcg total) by mouth at bedtime., Disp: 90 tablet, Rfl: 3 .  loperamide (IMODIUM A-D) 2 MG tablet, Take 2 mg by mouth 4 (four) times daily as needed for diarrhea or loose stools., Disp: , Rfl:  .  Multiple Vitamins-Minerals (PRESERVISION AREDS 2) CAPS, Take 1 tablet by mouth 2 (two) times daily., Disp: , Rfl:  .  ondansetron (ZOFRAN ODT) 4 MG disintegrating tablet, Take 1 tablet (4 mg total) by mouth every 8 (eight) hours as needed., Disp: 20 tablet, Rfl: 0 .  Probiotic Product (PROBIOTIC PO), Take by mouth 2 (two) times a day., Disp: , Rfl:  .  sertraline (ZOLOFT) 100 MG tablet, Take 1.5 tablets (150 mg total) by mouth daily., Disp: 135 tablet, Rfl: 3 .  valsartan-hydrochlorothiazide (DIOVAN-HCT) 160-25 MG tablet, Take 1 tablet by mouth at bedtime., Disp: 90 tablet, Rfl: 3 .  metoprolol succinate (TOPROL-XL) 50 MG 24  hr tablet, Take 1 tablet (50 mg total) by mouth daily. Take with or immediately following a meal., Disp: 90 tablet, Rfl: 3  Review of Systems  Constitutional: Negative.   Gastrointestinal: Positive for diarrhea. Negative for abdominal distention, abdominal pain, anal bleeding, blood in stool, constipation, nausea (Pt take Imodium almost daily.), rectal pain and vomiting.  Musculoskeletal: Negative.   Skin: Positive for rash. Negative for color change, pallor and  wound.  Neurological: Negative for dizziness, light-headedness and headaches.    Social History   Tobacco Use  . Smoking status: Never Smoker  . Smokeless tobacco: Never Used  Substance Use Topics  . Alcohol use: Yes    Comment: 1-2 drinks per year      Objective:   BP 132/72   Pulse (!) 52   Wt (!) 387 lb (175.5 kg)   SpO2 98%   BMI 66.43 kg/m  Vitals:   03/04/19 1059  BP: 132/72  Pulse: (!) 52  SpO2: 98%  Weight: (!) 387 lb (175.5 kg)  Body mass index is 66.43 kg/m.   Physical Exam Speaking in full sentences in NAD See photos from Mychart message  No results found for any visits on 03/04/19.     Assessment & Plan     Follow Up Instructions:    I discussed the assessment and treatment plan with the patient. The patient was provided an opportunity to ask questions and all were answered. The patient agreed with the plan and demonstrated an understanding of the instructions.   The patient was advised to call back or seek an in-person evaluation if the symptoms worsen or if the condition fails to improve as anticipated.  I provided 15 minutes of non-face-to-face time during this encounter.   1. Candidal intertrigo - photos and description c/w candidal intertrigo - discussed importance of keeping the area clean and dry - as she had no relief with Nystatin, will start Ketoconazole - use daily until resolution - discussed return precautions and signs of secondary infection   Meds ordered this encounter  Medications  . ketoconazole (NIZORAL) 2 % cream    Sig: Apply 1 application topically daily. Until resolution    Dispense:  15 g    Refill:  3    The entirety of the information documented in the History of Present Illness, Review of Systems and Physical Exam were personally obtained by me. Portions of this information were initially documented by Ashley Royalty, CMA and reviewed by me for thoroughness and accuracy.    Husna Krone, Dionne Bucy, MD  [facility]

## 2019-03-03 NOTE — Telephone Encounter (Signed)
Rash under both breast for one week or longer. Red, few fluid filled bumps weeping at times. Itches at times painful at times. Tried prescription ointment she had on hand and neosporin with no improvement. Has not had antibiotic therapy recently.No fever. No other rashes/hives. Care Advice including leave area open to air, keep it dry. Virtual appointment in the am. Encouraged to take photo and send to [place].   Reason for Disposition . Localized rash present > 7 days  Answer Assessment - Initial Assessment Questions 1. APPEARANCE of RASH: "Describe the rash."     Blister like bumps sometimes itchy sometimes hurt 2. LOCATION: "Where is the rash located?"     Under both breast 3. NUMBER: "How many spots are there?"      Few bumps with mostly raw area, some weeping. 4. SIZE: "How big are the spots?" (Inches, centimeters or compare to size of a coin)      small 5. ONSET: "When did the rash start?"      One week or longer 6. ITCHING: "Does the rash itch?" If so, ask: "How bad is the itch?"  (Scale 1-10; or mild, moderate, severe)     sometimes 7. PAIN: "Does the rash hurt?" If so, ask: "How bad is the pain?"  (Scale 1-10; or mild, moderate, severe)     mild 8. OTHER SYMPTOMS: "Do you have any other symptoms?" (e.g., fever)     none 9. PREGNANCY: "Is there any chance you are pregnant?" "When was your last menstrual period?"     na  Protocols used: RASH OR REDNESS - LOCALIZED-A-AH

## 2019-03-04 ENCOUNTER — Encounter: Payer: Self-pay | Admitting: Family Medicine

## 2019-03-04 ENCOUNTER — Ambulatory Visit (INDEPENDENT_AMBULATORY_CARE_PROVIDER_SITE_OTHER): Payer: Medicare Other | Admitting: Family Medicine

## 2019-03-04 VITALS — BP 132/72 | HR 52 | Wt 387.0 lb

## 2019-03-04 DIAGNOSIS — B372 Candidiasis of skin and nail: Secondary | ICD-10-CM | POA: Diagnosis not present

## 2019-03-04 DIAGNOSIS — B351 Tinea unguium: Secondary | ICD-10-CM | POA: Diagnosis not present

## 2019-03-04 MED ORDER — KETOCONAZOLE 2 % EX CREA: 1.0000 "application " | TOPICAL_CREAM | Freq: Every day | CUTANEOUS | 3 refills | Status: DC

## 2019-03-04 NOTE — Patient Instructions (Signed)
Intertrigo Intertrigo is skin irritation (inflammation) that happens in warm, moist areas of the body. The irritation can cause a rash and make skin raw and itchy. The rash is usually pink or red. It happens mostly between folds of skin or where skin rubs together, such as:  Between the toes.  In the armpits.  In the groin area.  Under the belly.  Under the breasts.  Around the butt area. This condition is not passed from person to person (is not contagious). What are the causes?  Heat, moisture, rubbing, and not enough air movement.  The condition can be made worse by: ? Sweat. ? Bacteria. ? A fungus, such as yeast. What increases the risk?  Moisture in your skin folds.  You are more likely to develop this condition if you: ? Have diabetes. ? Are overweight. ? Are not able to move around. ? Live in a warm and moist climate. ? Wear splints, braces, or other medical devices. ? Are not able to control your pee (urine) or poop (stool). What are the signs or symptoms?  A pink or red skin rash in the skin fold or near the skin fold.  Raw or scaly skin.  Itching.  A burning feeling.  Bleeding.  Leaking fluid.  A bad smell. How is this treated?  Cleaning and drying your skin.  Taking an antibiotic medicine or using an antibiotic skin cream for a bacterial infection.  Using an antifungal cream on your skin or taking pills for an infection that was caused by a fungus, such as yeast.  Using a steroid ointment to stop the itching and irritation.  Separating the skin fold with a clean cotton cloth to absorb moisture and allow air to flow into the area. Follow these instructions at home:  Keep the affected area clean and dry.  Do not scratch your skin.  Stay cool as much as you can. Use an air conditioner or a fan, if you have one.  Apply over-the-counter and prescription medicines only as told by your doctor.  If you were prescribed an antibiotic medicine,  use it as told by your doctor. Do not stop using the antibiotic even if your condition starts to get better.  Keep all follow-up visits as told by your doctor. This is important. How is this prevented?   Stay at a healthy weight.  Take care of your feet. This is very important if you have diabetes. You should: ? Wear shoes that fit well. ? Keep your feet dry. ? Wear clean cotton or wool socks.  Protect the skin in your groin and butt area as told by your doctor. To do this: ? Follow a regular cleaning routine. ? Use creams, powders, or ointments that protect your skin. ? Change protection pads often.  Do not wear tight clothes. Wear clothes that: ? Are loose. ? Take moisture away from your body. ? Are made of cotton.  Wear a bra that gives good support, if needed.  Shower and dry yourself well after being active. Use a hair dryer on a cool setting to dry between skin folds.  Keep your blood sugar under control if you have diabetes. Contact a doctor if:  Your symptoms do not get better with treatment.  Your symptoms get worse or they spread.  You notice more redness and warmth.  You have a fever. Summary  Intertrigo is skin irritation that occurs when folds of skin rub together.  This condition is caused by heat, moisture,   and rubbing.  This condition may be treated by cleaning and drying your skin and with medicines.  Apply over-the-counter and prescription medicines only as told by your doctor.  Keep all follow-up visits as told by your doctor. This is important. This information is not intended to replace advice given to you by your health care provider. Make sure you discuss any questions you have with your health care provider. Document Released: 04/12/2010 Document Revised: 12/17/2017 Document Reviewed: 12/17/2017 Elsevier Patient Education  2020 [employer]  

## 2019-03-15 ENCOUNTER — Ambulatory Visit: Payer: BLUE CROSS/BLUE SHIELD | Admitting: Podiatry

## 2019-03-22 ENCOUNTER — Ambulatory Visit: Payer: Medicare Other | Admitting: Podiatry

## 2019-04-16 ENCOUNTER — Other Ambulatory Visit: Payer: Self-pay | Admitting: Family Medicine

## 2019-04-16 NOTE — Telephone Encounter (Signed)
Requested Prescriptions  Pending Prescriptions Disp Refills  . valsartan-hydrochlorothiazide (DIOVAN-HCT) 160-25 MG tablet [Pharmacy Med Name: VALSARTAN-HCTZ 160-25 MG TAB] 90 tablet 0    Sig: TAKE ONE TABLET BY MOUTH AT BEDTIME     Cardiovascular: ARB + Diuretic Combos Failed - 04/16/2019 10:05 AM      Failed - K in normal range and within 180 days    Potassium  Date Value Ref Range Status  09/11/2018 3.3 (L) 3.5 - 5.1 mmol/L Final         Failed - Na in normal range and within 180 days    Sodium  Date Value Ref Range Status  09/11/2018 133 (L) 135 - 145 mmol/L Final  05/24/2018 137 134 - 144 mmol/L Final         Failed - Cr in normal range and within 180 days    Creatinine, Ser  Date Value Ref Range Status  09/11/2018 1.03 (H) 0.44 - 1.00 mg/dL Final         Failed - Ca in normal range and within 180 days    Calcium  Date Value Ref Range Status  09/11/2018 8.8 (L) 8.9 - 10.3 mg/dL Final         Passed - Patient is not pregnant      Passed - Last BP in normal range    BP Readings from Last 1 Encounters:  03/04/19 132/72         Passed - Valid encounter within last 6 months    Recent Outpatient Visits          1 month ago Candidal intertrigo   [facility] [place], Dionne Bucy, MD   3 months ago Essential hypertension, benign   [place], Dionne Bucy, MD   8 months ago Bacterial conjunctivitis of left eye   [place], Dionne Bucy, MD   10 months ago Essential hypertension, benign   [facility], Dionne Bucy, MD   1 year ago Depression with anxiety   [facility] [place], Dionne Bucy, MD

## 2019-04-23 DIAGNOSIS — F329 Major depressive disorder, single episode, unspecified: Secondary | ICD-10-CM | POA: Diagnosis not present

## 2019-04-23 DIAGNOSIS — B372 Candidiasis of skin and nail: Secondary | ICD-10-CM | POA: Diagnosis not present

## 2019-04-23 DIAGNOSIS — R262 Difficulty in walking, not elsewhere classified: Secondary | ICD-10-CM | POA: Diagnosis not present

## 2019-04-23 DIAGNOSIS — R197 Diarrhea, unspecified: Secondary | ICD-10-CM | POA: Diagnosis not present

## 2019-04-23 DIAGNOSIS — M6281 Muscle weakness (generalized): Secondary | ICD-10-CM | POA: Diagnosis not present

## 2019-04-23 DIAGNOSIS — R279 Unspecified lack of coordination: Secondary | ICD-10-CM | POA: Diagnosis not present

## 2019-04-23 DIAGNOSIS — M1990 Unspecified osteoarthritis, unspecified site: Secondary | ICD-10-CM | POA: Diagnosis not present

## 2019-04-23 DIAGNOSIS — F411 Generalized anxiety disorder: Secondary | ICD-10-CM | POA: Diagnosis not present

## 2019-04-23 DIAGNOSIS — M48061 Spinal stenosis, lumbar region without neurogenic claudication: Secondary | ICD-10-CM | POA: Diagnosis not present

## 2019-04-23 DIAGNOSIS — I10 Essential (primary) hypertension: Secondary | ICD-10-CM | POA: Diagnosis not present

## 2019-04-26 DIAGNOSIS — R197 Diarrhea, unspecified: Secondary | ICD-10-CM | POA: Diagnosis not present

## 2019-04-26 DIAGNOSIS — M1990 Unspecified osteoarthritis, unspecified site: Secondary | ICD-10-CM | POA: Diagnosis not present

## 2019-04-26 DIAGNOSIS — R279 Unspecified lack of coordination: Secondary | ICD-10-CM | POA: Diagnosis not present

## 2019-04-26 DIAGNOSIS — B372 Candidiasis of skin and nail: Secondary | ICD-10-CM | POA: Diagnosis not present

## 2019-04-26 DIAGNOSIS — R262 Difficulty in walking, not elsewhere classified: Secondary | ICD-10-CM | POA: Diagnosis not present

## 2019-04-26 DIAGNOSIS — M48061 Spinal stenosis, lumbar region without neurogenic claudication: Secondary | ICD-10-CM | POA: Diagnosis not present

## 2019-04-27 DIAGNOSIS — B372 Candidiasis of skin and nail: Secondary | ICD-10-CM | POA: Diagnosis not present

## 2019-04-27 DIAGNOSIS — M1990 Unspecified osteoarthritis, unspecified site: Secondary | ICD-10-CM | POA: Diagnosis not present

## 2019-04-27 DIAGNOSIS — R197 Diarrhea, unspecified: Secondary | ICD-10-CM | POA: Diagnosis not present

## 2019-04-27 DIAGNOSIS — R262 Difficulty in walking, not elsewhere classified: Secondary | ICD-10-CM | POA: Diagnosis not present

## 2019-04-27 DIAGNOSIS — R279 Unspecified lack of coordination: Secondary | ICD-10-CM | POA: Diagnosis not present

## 2019-04-27 DIAGNOSIS — M48061 Spinal stenosis, lumbar region without neurogenic claudication: Secondary | ICD-10-CM | POA: Diagnosis not present

## 2019-04-28 ENCOUNTER — Telehealth (INDEPENDENT_AMBULATORY_CARE_PROVIDER_SITE_OTHER): Payer: Medicare Other | Admitting: Family Medicine

## 2019-04-28 DIAGNOSIS — M1990 Unspecified osteoarthritis, unspecified site: Secondary | ICD-10-CM | POA: Diagnosis not present

## 2019-04-28 DIAGNOSIS — Z789 Other specified health status: Secondary | ICD-10-CM | POA: Diagnosis not present

## 2019-04-28 DIAGNOSIS — R5381 Other malaise: Secondary | ICD-10-CM

## 2019-04-28 DIAGNOSIS — B372 Candidiasis of skin and nail: Secondary | ICD-10-CM

## 2019-04-28 DIAGNOSIS — R197 Diarrhea, unspecified: Secondary | ICD-10-CM | POA: Diagnosis not present

## 2019-04-28 DIAGNOSIS — L608 Other nail disorders: Secondary | ICD-10-CM

## 2019-04-28 DIAGNOSIS — B351 Tinea unguium: Secondary | ICD-10-CM

## 2019-04-28 DIAGNOSIS — R279 Unspecified lack of coordination: Secondary | ICD-10-CM | POA: Diagnosis not present

## 2019-04-28 DIAGNOSIS — M48061 Spinal stenosis, lumbar region without neurogenic claudication: Secondary | ICD-10-CM | POA: Diagnosis not present

## 2019-04-28 DIAGNOSIS — R262 Difficulty in walking, not elsewhere classified: Secondary | ICD-10-CM | POA: Diagnosis not present

## 2019-04-28 MED ORDER — FLUCONAZOLE 150 MG PO TABS: 150.0000 mg | ORAL_TABLET | ORAL | 0 refills | Status: DC

## 2019-04-28 NOTE — Progress Notes (Signed)
Patient: Christina Rubio Female    DOB: March 11, 1951   69 y.o.   MRN: [id number] Visit Date: 04/28/2019  Today's Provider: Lavon Paganini, MD   Chief Complaint  Patient presents with  . Rash  . Foot Pain    Needs another referral    Subjective:    Virtual Visit via Video Note  I connected with Christina Rubio on 04/28/19 at  1:20 PM EST by a video enabled telemedicine application and verified that I am speaking with the correct person using two identifiers.  Location: Patient location: home Provider location: [facility] Persons involved in the visit: patient, provider   I discussed the limitations of evaluation and management by telemedicine and the availability of in person appointments. The patient expressed understanding and agreed to proceed.  Rash This is a chronic problem. The current episode started more than 1 month ago. The problem has been gradually improving since onset. The affected locations include the chest. The rash is characterized by blistering, pain and redness. She was exposed to nothing. Pertinent negatives include no anorexia, congestion, cough, diarrhea, facial edema, fatigue, fever or shortness of breath.  Foot Pain This is a chronic problem. The problem has been gradually worsening. Associated symptoms include a rash. Pertinent negatives include no anorexia, chest pain, congestion, coughing, fatigue, fever or headaches.    She was unable to make her appt with Podiatry as her daughter was hospitalized  She is trying to get [facility] RN set up to get wound care. She is also having difficulty getting in and out of the bathtub and with ADLs due to deconditioning and OA.   Allergies  Allergen Reactions  . Epinephrine Palpitations    faint  . Penicillins Rash    Has patient had a PCN reaction causing immediate rash, facial/tongue/throat swelling, SOB or lightheadedness with hypotension:  Yes Has patient had a PCN reaction causing severe rash  involving mucus membranes or skin necrosis: No Has patient had a PCN reaction that required hospitalization: No Has patient had a PCN reaction occurring within the last 10 years: No If all of the above answers are "NO", then may proceed with Cephalosporin use.;  . Strawberry Extract Rash     Current Outpatient Medications:  .  ALPRAZolam (XANAX) 0.5 MG tablet, Take 1 tablet (0.5 mg total) by mouth daily as needed. for anxiety, Disp: 30 tablet, Rfl: 3 .  atorvastatin (LIPITOR) 10 MG tablet, Take 1 tablet (10 mg total) by mouth at bedtime., Disp: 90 tablet, Rfl: 3 .  famotidine (PEPCID) 20 MG tablet, Take 1 tablet (20 mg total) by mouth 2 (two) times daily., Disp: 60 tablet, Rfl: 6 .  ketoconazole (NIZORAL) 2 % cream, Apply 1 application topically daily. Until resolution, Disp: 15 g, Rfl: 3 .  levothyroxine (SYNTHROID, LEVOTHROID) 75 MCG tablet, Take 1 tablet (75 mcg total) by mouth at bedtime., Disp: 90 tablet, Rfl: 3 .  loperamide (IMODIUM A-D) 2 MG tablet, Take 2 mg by mouth 4 (four) times daily as needed for diarrhea or loose stools., Disp: , Rfl:  .  Multiple Vitamins-Minerals (PRESERVISION AREDS 2) CAPS, Take 1 tablet by mouth 2 (two) times daily., Disp: , Rfl:  .  ondansetron (ZOFRAN ODT) 4 MG disintegrating tablet, Take 1 tablet (4 mg total) by mouth every 8 (eight) hours as needed., Disp: 20 tablet, Rfl: 0 .  Probiotic Product (PROBIOTIC PO), Take by mouth 2 (two) times a day., Disp: , Rfl:  .  sertraline (  ZOLOFT) 100 MG tablet, Take 1.5 tablets (150 mg total) by mouth daily., Disp: 135 tablet, Rfl: 3 .  valsartan-hydrochlorothiazide (DIOVAN-HCT) 160-25 MG tablet, TAKE ONE TABLET BY MOUTH AT BEDTIME, Disp: 90 tablet, Rfl: 0 .  fluconazole (DIFLUCAN) 150 MG tablet, Take 1 tablet (150 mg total) by mouth once a week., Disp: 4 tablet, Rfl: 0 .  metoprolol succinate (TOPROL-XL) 50 MG 24 hr tablet, Take 1 tablet (50 mg total) by mouth daily. Take with or immediately following a meal., Disp: 90  tablet, Rfl: 3  Review of Systems  Constitutional: Negative.  Negative for fatigue and fever.  HENT: Negative for congestion.   Respiratory: Negative.  Negative for cough and shortness of breath.   Cardiovascular: Positive for leg swelling (Feet swelling.). Negative for chest pain and palpitations.  Gastrointestinal: Negative for anorexia and diarrhea.  Skin: Positive for color change, rash and wound. Negative for pallor.  Neurological: Negative for dizziness, light-headedness and headaches.    Social History   Tobacco Use  . Smoking status: Never Smoker  . Smokeless tobacco: Never Used  Substance Use Topics  . Alcohol use: Yes    Comment: 1-2 drinks per year     Objective:   There were no vitals taken for this visit. There were no vitals filed for this visit.There is no height or weight on file to calculate BMI.   Physical Exam Constitutional:      General: She is not in acute distress.    Appearance: She is obese.  Pulmonary:     Effort: Pulmonary effort is normal. No respiratory distress.  Neurological:     Mental Status: She is alert and oriented to person, place, and time. Mental status is at baseline.  Psychiatric:        Mood and Affect: Mood normal.        Behavior: Behavior normal.      No results found for any visits on 04/28/19.     Assessment & Plan    Follow Up Instructions:  I discussed the assessment and treatment plan with the patient. The patient was provided an opportunity to ask questions and all were answered. The patient agreed with the plan and demonstrated an understanding of the instructions.   The patient was advised to call back or seek an in-person evaluation if the symptoms worsen or if the condition fails to improve as anticipated.  1. Candidal intertrigo - some improvement with ketoconazole, but not completely healed - discussed keeping area clean and dry - will try fluconazole weekly x4 wks - [facility] RN for wound care  2. Toenail  fungus 3. Toenail deformity - ongoing issue - unable to make last podiatry appt - will send new referral  4. Decreased activities of daily living (ADL) 5. Physical deconditioning - more difficulty getting in and out of the bathtub and doing ADLs - would benefit from [facility] PT/OT - referral placed today  Meds ordered this encounter  Medications  . fluconazole (DIFLUCAN) 150 MG tablet    Sig: Take 1 tablet (150 mg total) by mouth once a week.    Dispense:  4 tablet    Refill:  0     Return if symptoms worsen or fail to improve.   The entirety of the information documented in the History of Present Illness, Review of Systems and Physical Exam were personally obtained by me. Portions of this information were initially documented by Ashley Royalty, CMA and reviewed by me for thoroughness and accuracy.  Jayme Mednick, Dionne Bucy, MD MPH [facility]

## 2019-05-02 ENCOUNTER — Ambulatory Visit: Payer: Medicare Other | Attending: Internal Medicine

## 2019-05-03 ENCOUNTER — Ambulatory Visit: Payer: Medicare Other | Admitting: Podiatry

## 2019-05-03 DIAGNOSIS — R197 Diarrhea, unspecified: Secondary | ICD-10-CM | POA: Diagnosis not present

## 2019-05-03 DIAGNOSIS — M1990 Unspecified osteoarthritis, unspecified site: Secondary | ICD-10-CM | POA: Diagnosis not present

## 2019-05-03 DIAGNOSIS — R279 Unspecified lack of coordination: Secondary | ICD-10-CM | POA: Diagnosis not present

## 2019-05-03 DIAGNOSIS — M48061 Spinal stenosis, lumbar region without neurogenic claudication: Secondary | ICD-10-CM | POA: Diagnosis not present

## 2019-05-03 DIAGNOSIS — R262 Difficulty in walking, not elsewhere classified: Secondary | ICD-10-CM | POA: Diagnosis not present

## 2019-05-03 DIAGNOSIS — B372 Candidiasis of skin and nail: Secondary | ICD-10-CM | POA: Diagnosis not present

## 2019-05-04 DIAGNOSIS — R262 Difficulty in walking, not elsewhere classified: Secondary | ICD-10-CM | POA: Diagnosis not present

## 2019-05-04 DIAGNOSIS — R197 Diarrhea, unspecified: Secondary | ICD-10-CM | POA: Diagnosis not present

## 2019-05-04 DIAGNOSIS — B372 Candidiasis of skin and nail: Secondary | ICD-10-CM | POA: Diagnosis not present

## 2019-05-04 DIAGNOSIS — M1990 Unspecified osteoarthritis, unspecified site: Secondary | ICD-10-CM | POA: Diagnosis not present

## 2019-05-04 DIAGNOSIS — M48061 Spinal stenosis, lumbar region without neurogenic claudication: Secondary | ICD-10-CM | POA: Diagnosis not present

## 2019-05-04 DIAGNOSIS — R279 Unspecified lack of coordination: Secondary | ICD-10-CM | POA: Diagnosis not present

## 2019-05-05 DIAGNOSIS — R197 Diarrhea, unspecified: Secondary | ICD-10-CM | POA: Diagnosis not present

## 2019-05-05 DIAGNOSIS — R279 Unspecified lack of coordination: Secondary | ICD-10-CM | POA: Diagnosis not present

## 2019-05-05 DIAGNOSIS — R262 Difficulty in walking, not elsewhere classified: Secondary | ICD-10-CM | POA: Diagnosis not present

## 2019-05-05 DIAGNOSIS — M1990 Unspecified osteoarthritis, unspecified site: Secondary | ICD-10-CM | POA: Diagnosis not present

## 2019-05-05 DIAGNOSIS — B372 Candidiasis of skin and nail: Secondary | ICD-10-CM | POA: Diagnosis not present

## 2019-05-05 DIAGNOSIS — M48061 Spinal stenosis, lumbar region without neurogenic claudication: Secondary | ICD-10-CM | POA: Diagnosis not present

## 2019-05-06 DIAGNOSIS — R197 Diarrhea, unspecified: Secondary | ICD-10-CM | POA: Diagnosis not present

## 2019-05-06 DIAGNOSIS — M1990 Unspecified osteoarthritis, unspecified site: Secondary | ICD-10-CM | POA: Diagnosis not present

## 2019-05-06 DIAGNOSIS — R262 Difficulty in walking, not elsewhere classified: Secondary | ICD-10-CM | POA: Diagnosis not present

## 2019-05-06 DIAGNOSIS — R279 Unspecified lack of coordination: Secondary | ICD-10-CM | POA: Diagnosis not present

## 2019-05-06 DIAGNOSIS — B372 Candidiasis of skin and nail: Secondary | ICD-10-CM | POA: Diagnosis not present

## 2019-05-06 DIAGNOSIS — M48061 Spinal stenosis, lumbar region without neurogenic claudication: Secondary | ICD-10-CM | POA: Diagnosis not present

## 2019-05-10 ENCOUNTER — Ambulatory Visit (INDEPENDENT_AMBULATORY_CARE_PROVIDER_SITE_OTHER): Payer: Medicare Other | Admitting: Podiatry

## 2019-05-10 ENCOUNTER — Other Ambulatory Visit: Payer: Self-pay

## 2019-05-10 DIAGNOSIS — B351 Tinea unguium: Secondary | ICD-10-CM

## 2019-05-10 DIAGNOSIS — B372 Candidiasis of skin and nail: Secondary | ICD-10-CM | POA: Diagnosis not present

## 2019-05-10 DIAGNOSIS — M48061 Spinal stenosis, lumbar region without neurogenic claudication: Secondary | ICD-10-CM | POA: Diagnosis not present

## 2019-05-10 DIAGNOSIS — M79674 Pain in right toe(s): Secondary | ICD-10-CM | POA: Diagnosis not present

## 2019-05-10 DIAGNOSIS — R7303 Prediabetes: Secondary | ICD-10-CM | POA: Diagnosis not present

## 2019-05-10 DIAGNOSIS — M79675 Pain in left toe(s): Secondary | ICD-10-CM

## 2019-05-10 DIAGNOSIS — R262 Difficulty in walking, not elsewhere classified: Secondary | ICD-10-CM | POA: Diagnosis not present

## 2019-05-10 DIAGNOSIS — R279 Unspecified lack of coordination: Secondary | ICD-10-CM | POA: Diagnosis not present

## 2019-05-10 DIAGNOSIS — R197 Diarrhea, unspecified: Secondary | ICD-10-CM | POA: Diagnosis not present

## 2019-05-10 DIAGNOSIS — M1990 Unspecified osteoarthritis, unspecified site: Secondary | ICD-10-CM | POA: Diagnosis not present

## 2019-05-11 ENCOUNTER — Encounter: Payer: Self-pay | Admitting: Podiatry

## 2019-05-11 NOTE — Progress Notes (Signed)
  Subjective:  Patient ID: Christina Rubio, female    DOB: 1950-12-05,  MRN: [id number]  Chief Complaint  Patient presents with  . Nail Problem    pt is here for bil toenail fungus as well as possible dry skin to the feet as well.   69 y.o. female returns for the above complaint.  Patient presents with painful thickened elongated mycotic toenails x10.  Patient states that this has progressively gotten worse with time.  She would like to know if there is anything that could be done for these toenails.  They have been painful to ambulate on there is an antalgic gait associated with it.  She would like to know if this could be debrided down to give her some relief.  She denies any other acute complaints.  She denies having diabetes.  She states that she is prediabetic.  With last A1c of 6.0.  Objective:  There were no vitals filed for this visit. Podiatric Exam: Vascular: dorsalis pedis and posterior tibial pulses are palpable bilateral. Capillary return is immediate. Temperature gradient is WNL. Skin turgor WNL  Sensorium: Normal Semmes Weinstein monofilament test. Normal tactile sensation bilaterally. Nail Exam: Pt has thick disfigured discolored nails with subungual debris noted bilateral entire nail hallux through fifth toenails Ulcer Exam: There is no evidence of ulcer or pre-ulcerative changes or infection. Orthopedic Exam: Muscle tone and strength are WNL. No limitations in general ROM. No crepitus or effusions noted. HAV  B/L.  Hammer toes 2-5  B/L. Skin: No Porokeratosis. No infection or ulcers  Assessment & Plan:  Patient was evaluated and treated and all questions answered.  Onychomycosis with pain  -Nails palliatively debrided as below. -Educated on self-care  Procedure: Nail Debridement Rationale: pain  Type of Debridement: manual, sharp debridement. Instrumentation: Nail nipper, rotary burr. Number of Nails: 10  Procedures and Treatment: Consent by patient was obtained for  treatment procedures. The patient understood the discussion of treatment and procedures well. All questions were answered thoroughly reviewed. Debridement of mycotic and hypertrophic toenails, 1 through 5 bilateral and clearing of subungual debris. No ulceration, no infection noted.  Return Visit-Office Procedure: Patient instructed to return to the office for a follow up visit 3 months for continued evaluation and treatment.  Boneta Lucks, DPM    No follow-ups on file.

## 2019-05-13 DIAGNOSIS — R262 Difficulty in walking, not elsewhere classified: Secondary | ICD-10-CM | POA: Diagnosis not present

## 2019-05-13 DIAGNOSIS — B372 Candidiasis of skin and nail: Secondary | ICD-10-CM | POA: Diagnosis not present

## 2019-05-13 DIAGNOSIS — R197 Diarrhea, unspecified: Secondary | ICD-10-CM | POA: Diagnosis not present

## 2019-05-13 DIAGNOSIS — M48061 Spinal stenosis, lumbar region without neurogenic claudication: Secondary | ICD-10-CM | POA: Diagnosis not present

## 2019-05-13 DIAGNOSIS — R279 Unspecified lack of coordination: Secondary | ICD-10-CM | POA: Diagnosis not present

## 2019-05-13 DIAGNOSIS — M1990 Unspecified osteoarthritis, unspecified site: Secondary | ICD-10-CM | POA: Diagnosis not present

## 2019-05-17 DIAGNOSIS — M48061 Spinal stenosis, lumbar region without neurogenic claudication: Secondary | ICD-10-CM | POA: Diagnosis not present

## 2019-05-17 DIAGNOSIS — R197 Diarrhea, unspecified: Secondary | ICD-10-CM | POA: Diagnosis not present

## 2019-05-17 DIAGNOSIS — M1990 Unspecified osteoarthritis, unspecified site: Secondary | ICD-10-CM | POA: Diagnosis not present

## 2019-05-17 DIAGNOSIS — R279 Unspecified lack of coordination: Secondary | ICD-10-CM | POA: Diagnosis not present

## 2019-05-17 DIAGNOSIS — B372 Candidiasis of skin and nail: Secondary | ICD-10-CM | POA: Diagnosis not present

## 2019-05-17 DIAGNOSIS — R262 Difficulty in walking, not elsewhere classified: Secondary | ICD-10-CM | POA: Diagnosis not present

## 2019-05-19 DIAGNOSIS — R197 Diarrhea, unspecified: Secondary | ICD-10-CM | POA: Diagnosis not present

## 2019-05-19 DIAGNOSIS — M48061 Spinal stenosis, lumbar region without neurogenic claudication: Secondary | ICD-10-CM | POA: Diagnosis not present

## 2019-05-19 DIAGNOSIS — R262 Difficulty in walking, not elsewhere classified: Secondary | ICD-10-CM | POA: Diagnosis not present

## 2019-05-19 DIAGNOSIS — M1990 Unspecified osteoarthritis, unspecified site: Secondary | ICD-10-CM | POA: Diagnosis not present

## 2019-05-19 DIAGNOSIS — R279 Unspecified lack of coordination: Secondary | ICD-10-CM | POA: Diagnosis not present

## 2019-05-19 DIAGNOSIS — B372 Candidiasis of skin and nail: Secondary | ICD-10-CM | POA: Diagnosis not present

## 2019-05-20 ENCOUNTER — Ambulatory Visit: Payer: Medicare Other | Attending: Internal Medicine

## 2019-05-20 DIAGNOSIS — Z23 Encounter for immunization: Secondary | ICD-10-CM

## 2019-05-20 NOTE — Progress Notes (Signed)
   Covid-19 Vaccination Clinic  Name:  Christina Rubio    MRN: [id number] DOB: 12-05-1950  05/20/2019  [name] was observed post Covid-19 immunization for 15 minutes without incidence. She was provided with Vaccine Information Sheet and instruction to access the V-Safe system.   [name] was instructed to call 911 with any severe reactions post vaccine: Marland Kitchen Difficulty breathing  . Swelling of your face and throat  . A fast heartbeat  . A bad rash all over your body  . Dizziness and weakness    Immunizations Administered    Name Date Dose VIS Date Route   Pfizer COVID-19 Vaccine 05/20/2019 10:13 AM 0.3 mL 03/04/2019 Intramuscular   Manufacturer: [place]   Lot: [id number]   [place]: [id number]

## 2019-05-23 DIAGNOSIS — I10 Essential (primary) hypertension: Secondary | ICD-10-CM | POA: Diagnosis not present

## 2019-05-23 DIAGNOSIS — B372 Candidiasis of skin and nail: Secondary | ICD-10-CM | POA: Diagnosis not present

## 2019-05-23 DIAGNOSIS — M6281 Muscle weakness (generalized): Secondary | ICD-10-CM | POA: Diagnosis not present

## 2019-05-23 DIAGNOSIS — F411 Generalized anxiety disorder: Secondary | ICD-10-CM | POA: Diagnosis not present

## 2019-05-23 DIAGNOSIS — M48061 Spinal stenosis, lumbar region without neurogenic claudication: Secondary | ICD-10-CM | POA: Diagnosis not present

## 2019-05-23 DIAGNOSIS — R262 Difficulty in walking, not elsewhere classified: Secondary | ICD-10-CM | POA: Diagnosis not present

## 2019-05-23 DIAGNOSIS — R279 Unspecified lack of coordination: Secondary | ICD-10-CM | POA: Diagnosis not present

## 2019-05-23 DIAGNOSIS — F329 Major depressive disorder, single episode, unspecified: Secondary | ICD-10-CM | POA: Diagnosis not present

## 2019-05-23 DIAGNOSIS — R197 Diarrhea, unspecified: Secondary | ICD-10-CM | POA: Diagnosis not present

## 2019-05-23 DIAGNOSIS — M1990 Unspecified osteoarthritis, unspecified site: Secondary | ICD-10-CM | POA: Diagnosis not present

## 2019-05-31 DIAGNOSIS — M48061 Spinal stenosis, lumbar region without neurogenic claudication: Secondary | ICD-10-CM | POA: Diagnosis not present

## 2019-05-31 DIAGNOSIS — R262 Difficulty in walking, not elsewhere classified: Secondary | ICD-10-CM | POA: Diagnosis not present

## 2019-05-31 DIAGNOSIS — M1990 Unspecified osteoarthritis, unspecified site: Secondary | ICD-10-CM | POA: Diagnosis not present

## 2019-05-31 DIAGNOSIS — R279 Unspecified lack of coordination: Secondary | ICD-10-CM | POA: Diagnosis not present

## 2019-05-31 DIAGNOSIS — B372 Candidiasis of skin and nail: Secondary | ICD-10-CM | POA: Diagnosis not present

## 2019-05-31 DIAGNOSIS — R197 Diarrhea, unspecified: Secondary | ICD-10-CM | POA: Diagnosis not present

## 2019-06-03 DIAGNOSIS — M1990 Unspecified osteoarthritis, unspecified site: Secondary | ICD-10-CM | POA: Diagnosis not present

## 2019-06-03 DIAGNOSIS — R262 Difficulty in walking, not elsewhere classified: Secondary | ICD-10-CM | POA: Diagnosis not present

## 2019-06-03 DIAGNOSIS — M48061 Spinal stenosis, lumbar region without neurogenic claudication: Secondary | ICD-10-CM | POA: Diagnosis not present

## 2019-06-03 DIAGNOSIS — R279 Unspecified lack of coordination: Secondary | ICD-10-CM | POA: Diagnosis not present

## 2019-06-03 DIAGNOSIS — R197 Diarrhea, unspecified: Secondary | ICD-10-CM | POA: Diagnosis not present

## 2019-06-03 DIAGNOSIS — B372 Candidiasis of skin and nail: Secondary | ICD-10-CM | POA: Diagnosis not present

## 2019-06-06 DIAGNOSIS — R279 Unspecified lack of coordination: Secondary | ICD-10-CM | POA: Diagnosis not present

## 2019-06-06 DIAGNOSIS — M48061 Spinal stenosis, lumbar region without neurogenic claudication: Secondary | ICD-10-CM | POA: Diagnosis not present

## 2019-06-06 DIAGNOSIS — R262 Difficulty in walking, not elsewhere classified: Secondary | ICD-10-CM | POA: Diagnosis not present

## 2019-06-06 DIAGNOSIS — B372 Candidiasis of skin and nail: Secondary | ICD-10-CM | POA: Diagnosis not present

## 2019-06-06 DIAGNOSIS — M1990 Unspecified osteoarthritis, unspecified site: Secondary | ICD-10-CM | POA: Diagnosis not present

## 2019-06-06 DIAGNOSIS — R197 Diarrhea, unspecified: Secondary | ICD-10-CM | POA: Diagnosis not present

## 2019-06-14 ENCOUNTER — Ambulatory Visit: Payer: Medicare Other | Attending: Internal Medicine

## 2019-06-14 DIAGNOSIS — Z23 Encounter for immunization: Secondary | ICD-10-CM

## 2019-06-14 NOTE — Progress Notes (Signed)
   Covid-19 Vaccination Clinic  Name:  Christina Rubio    MRN: [id number] DOB: Jun 14, 1950  06/14/2019  [name] was observed post Covid-19 immunization for 15 minutes without incident. She was provided with Vaccine Information Sheet and instruction to access the V-Safe system.   [name] was instructed to call 911 with any severe reactions post vaccine: Marland Kitchen Difficulty breathing  . Swelling of face and throat  . A fast heartbeat  . A bad rash all over body  . Dizziness and weakness   Immunizations Administered    Name Date Dose VIS Date Route   Pfizer COVID-19 Vaccine 06/14/2019 12:12 PM 0.3 mL 03/04/2019 Intramuscular   Manufacturer: [place]   Lot: [id number]   [place]: [id number]

## 2019-06-16 ENCOUNTER — Other Ambulatory Visit: Payer: Self-pay | Admitting: Family Medicine

## 2019-06-21 DIAGNOSIS — M48061 Spinal stenosis, lumbar region without neurogenic claudication: Secondary | ICD-10-CM | POA: Diagnosis not present

## 2019-06-21 DIAGNOSIS — R197 Diarrhea, unspecified: Secondary | ICD-10-CM | POA: Diagnosis not present

## 2019-06-21 DIAGNOSIS — R279 Unspecified lack of coordination: Secondary | ICD-10-CM | POA: Diagnosis not present

## 2019-06-21 DIAGNOSIS — B372 Candidiasis of skin and nail: Secondary | ICD-10-CM | POA: Diagnosis not present

## 2019-06-21 DIAGNOSIS — M1990 Unspecified osteoarthritis, unspecified site: Secondary | ICD-10-CM | POA: Diagnosis not present

## 2019-06-21 DIAGNOSIS — R262 Difficulty in walking, not elsewhere classified: Secondary | ICD-10-CM | POA: Diagnosis not present

## 2019-07-01 ENCOUNTER — Other Ambulatory Visit: Payer: Self-pay | Admitting: Family Medicine

## 2019-07-05 NOTE — Progress Notes (Signed)
Subjective:   Christina Rubio is a 69 y.o. female who presents for Medicare Annual (Subsequent) preventive examination.    This visit is being conducted through telemedicine due to the COVID-19 pandemic. This patient has given me verbal consent via doximity to conduct this visit, patient states they are participating from their home address. Some vital signs may be absent or patient reported.    Patient identification: identified by name, DOB, and current address  Review of Systems:  N/A  Cardiac Risk Factors include: advanced age (>38men, >7 women);obesity (BMI >30kg/m2);hypertension;dyslipidemia     Objective:     Vitals: There were no vitals taken for this visit.  There is no height or weight on file to calculate BMI. Unable to obtain vitals due to visit being conducted via telephonically.   Advanced Directives 07/06/2019 09/11/2018 05/11/2018 03/14/2016 03/07/2016 01/21/2016 01/16/2016  Does Patient Have a Medical Advance Directive? No No No No No No No  Would patient like information on creating a medical advance directive? No - Patient declined - Yes (MAU/Ambulatory/Procedural Areas - Information given) - - No - patient declined information No - patient declined information    Tobacco Social History   Tobacco Use  Smoking Status Never Smoker  Smokeless Tobacco Never Used     Counseling given: Not Answered   Clinical Intake:  Pre-visit preparation completed: Yes  Pain : No/denies pain     Nutritional Risks: Nausea/ vomitting/ diarrhea(Has nausea occasionally due to esophagus issues.) Diabetes: No  How often do you need to have someone help you when you read instructions, pamphlets, or other written materials from your doctor or pharmacy?: 1 - Never  Interpreter Needed?: No  Information entered by :: [facility], LPN  Past Medical History:  Diagnosis Date  . Anxiety   . Arthritis   . Depression   . Hyperlipidemia   . Hypertension   . Neuromuscular  disorder ([place])   . Osteoporosis   . PONV (postoperative nausea and vomiting)   . Skin cancer    non melanoma  . Spinal stenosis   . Thyroid disease    hyperthyroid s/p partial thyroidectomy   Past Surgical History:  Procedure Laterality Date  . CESAREAN SECTION    . CHOLECYSTECTOMY    . COLON SURGERY    . FEMUR IM NAIL Right 01/09/2016   Procedure: INTRAMEDULLARY (IM) RETROGRADE FEMORAL NAILING RIGHT DISTAL FEMUR;  Surgeon: Dorna Leitz, MD;  Location: [place];  Service: Orthopedics;  Laterality: Right;  . LAPAROSCOPIC GASTRIC SLEEVE RESECTION    . Right thyroidectomy    . TONSILLECTOMY     Family History  Problem Relation Age of Onset  . Lung cancer Mother   . Prostate cancer Father   . Depression Daughter   . Anemia Daughter        [facility]  . Thyroid cancer Maternal Uncle   . Lung cancer Paternal Uncle   . Stroke Maternal Grandmother   . Stroke Maternal Grandfather   . Stroke Paternal Grandmother   . Healthy Sister   . Healthy Brother   . Healthy Sister   . Emphysema Paternal Grandfather   . Colon cancer Neg Hx   . Breast cancer Neg Hx   . Ovarian cancer Neg Hx   . Cervical cancer Neg Hx    Social History   Socioeconomic History  . Marital status: Widowed    Spouse name: Not on file  . Number of children: 1  . Years of education: 2 master's degrees  .  Highest education level: Master's degree (e.g., MA, MS, MEng, MEd, MSW, MBA)  Occupational History  . Occupation: retired    Comment: Proofreader  Tobacco Use  . Smoking status: Never Smoker  . Smokeless tobacco: Never Used  Substance and Sexual Activity  . Alcohol use: Yes    Comment: 1-2 drinks per year  . Drug use: No  . Sexual activity: Never  Other Topics Concern  . Not on file  Social History Narrative  . Not on file   Social Determinants of Health   Financial Resource Strain: Low Risk   . Difficulty of Paying Living Expenses: Not hard at all  Food Insecurity: No Food Insecurity  . Worried  About Charity fundraiser in the Last Year: Never true  . Ran Out of Food in the Last Year: Never true  Transportation Needs: No Transportation Needs  . Lack of Transportation (Medical): No  . Lack of Transportation (Non-Medical): No  Physical Activity: Inactive  . Days of Exercise per Week: 0 days  . Minutes of Exercise per Session: 0 min  Stress: Stress Concern Present  . Feeling of Stress : Very much  Social Connections: Moderately Isolated  . Frequency of Communication with Friends and Family: More than three times a week  . Frequency of Social Gatherings with Friends and Family: More than three times a week  . Attends Religious Services: Never  . Active Member of Clubs or Organizations: No  . Attends Archivist Meetings: Never  . Marital Status: Widowed    Outpatient Encounter Medications as of 07/06/2019  Medication Sig  . ALPRAZolam (XANAX) 0.5 MG tablet TAKE ONE TABLET BY MOUTH DAILY AS NEEDED FOR ANXIETY  . atorvastatin (LIPITOR) 10 MG tablet TAKE ONE TABLET BY MOUTH AT BEDTIME  . famotidine (PEPCID) 20 MG tablet Take 1 tablet (20 mg total) by mouth 2 (two) times daily.  Marland Kitchen ketoconazole (NIZORAL) 2 % cream APPLY TOPICALLY ONCE DAILY UNTIL RESOLUTION  . levothyroxine (SYNTHROID) 75 MCG tablet TAKE ONE TABLET BY MOUTH EVERY NIGHT AT BEDTIME  . loperamide (IMODIUM A-D) 2 MG tablet Take 2 mg by mouth 4 (four) times daily as needed for diarrhea or loose stools.  . metoprolol succinate (TOPROL-XL) 50 MG 24 hr tablet Take 1 tablet (50 mg total) by mouth daily. Take with or immediately following a meal.  . Multiple Vitamins-Minerals (PRESERVISION AREDS 2) CAPS Take 1 tablet by mouth 2 (two) times daily.  . ondansetron (ZOFRAN ODT) 4 MG disintegrating tablet Take 1 tablet (4 mg total) by mouth every 8 (eight) hours as needed.  . Probiotic Product (PROBIOTIC PO) Take by mouth daily.   . sertraline (ZOLOFT) 100 MG tablet Take 1.5 tablets (150 mg total) by mouth daily.  .  valsartan-hydrochlorothiazide (DIOVAN-HCT) 160-25 MG tablet TAKE ONE TABLET BY MOUTH AT BEDTIME  . fluconazole (DIFLUCAN) 150 MG tablet Take 1 tablet (150 mg total) by mouth once a week. (Patient not taking: Reported on 07/06/2019)   No facility-administered encounter medications on file as of 07/06/2019.    Activities of Daily Living In your present state of health, do you have any difficulty performing the following activities: 07/06/2019  Hearing? N  Vision? N  Difficulty concentrating or making decisions? N  Walking or climbing stairs? Y  Comment Due to feet pain.  Dressing or bathing? N  Doing errands, shopping? N  Preparing Food and eating ? Y  Comment Limited cooking due to mobility issues.  Using the Toilet? N  In the past six months, have you accidently leaked urine? Y  Comment With urges once standing.  Do you have problems with loss of bowel control? N  Managing your Medications? N  Managing your Finances? N  Housekeeping or managing your Housekeeping? Y  Comment Does limited cleaning due to pain and mobility issues.  Some recent data might be hidden    Patient Care Team: [name], MD as PCP - General (Family Medicine) Dorna Leitz, MD as Consulting Physician (Orthopedic Surgery) End, Harrell Gave, MD as Consulting Physician (Cardiology)    Assessment:   This is a routine wellness examination for [facility].  Exercise Activities and Dietary recommendations Current Exercise Habits: Home exercise routine, Type of exercise: stretching;Other - see comments(PT), Time (Minutes): 15, Frequency (Times/Week): 7, Weekly Exercise (Minutes/Week): 105, Intensity: Mild, Exercise limited by: orthopedic condition(s)  Goals    . DIET - INCREASE WATER INTAKE     Recommend to drink at least 6-8 8oz glasses of water per day.       Fall Risk: Fall Risk  07/06/2019 05/11/2018 05/28/2017  Falls in the past year? 1 0 No  Number falls in past yr: 0 - -  Injury with Fall? 0 - -    Follow up Falls prevention discussed - -    FALL RISK PREVENTION PERTAINING TO THE HOME:  Any stairs in or around the home? Yes  If so, are there any without handrails? No   Home free of loose throw rugs in walkways, pet beds, electrical cords, etc? Yes  Adequate lighting in your home to reduce risk of falls? Yes   ASSISTIVE DEVICES UTILIZED TO PREVENT FALLS:  Life alert? No  Use of a cane, walker or w/c? Yes  Grab bars in the bathroom? Yes  Shower chair or bench in shower? Yes  Elevated toilet seat or a handicapped toilet? Yes    TIMED UP AND GO:  Was the test performed? No .    Depression Screen PHQ 2/9 Scores 07/06/2019 01/13/2019 05/11/2018 11/17/2017  PHQ - 2 Score 5 3 4 5   PHQ- 9 Score 16 19 12 15      Cognitive Function     6CIT Screen 07/06/2019 05/11/2018  What Year? 0 points 0 points  What month? 0 points 0 points  What time? 0 points 0 points  Count back from 20 0 points 0 points  Months in reverse 0 points 0 points  Repeat phrase 0 points 0 points  Total Score 0 0    Immunization History  Administered Date(s) Administered  . Fluad Quad(high Dose 65+) 01/06/2019  . Influenza, High Dose Seasonal PF 01/29/2017, 04/20/2018  . PFIZER SARS-COV-2 Vaccination 05/20/2019, 06/14/2019  . Pneumococcal Conjugate-13 05/28/2017  . Td 05/24/2018  . Zoster Recombinat (Shingrix) 11/17/2017    Qualifies for Shingles Vaccine? Yes , Shingrix dose #2 currently due.  Tdap: Up to date  Flu Vaccine: Up to date  Pneumococcal Vaccine: Due for Pneumococcal vaccine. Does the patient want to receive this vaccine today?  No . Advised may receive this vaccine at local pharmacy or Health Dept. Aware to provide a copy of the vaccination record if obtained from local pharmacy or Health Dept. Verbalized acceptance and understanding.   Screening Tests Health Maintenance  Topic Date Due  . MAMMOGRAM  Never done  . COLONOSCOPY  Never done  . DEXA SCAN  Never done  . PNA vac Low  Risk Adult (2 of 2 - PPSV23) 05/29/2018  . INFLUENZA VACCINE  10/23/2019  .  TETANUS/TDAP  05/23/2028  . Hepatitis C Screening  Completed    Cancer Screenings:  Colorectal Screening: Currently due. Declined referral at this time.  Mammogram: Currently due. Declined order today.   Bone Density: Currently due. Declined order at this time,  Lung Cancer Screening: (Low Dose CT Chest recommended if Age 25-80 years, 30 pack-year currently smoking OR have quit w/in 15years.) does qualify.   Additional Screening:  Hepatitis C Screening: Up to date  Vision Screening: Recommended annual ophthalmology exams for early detection of glaucoma and other disorders of the eye.  Dental Screening: Recommended annual dental exams for proper oral hygiene  Community Resource Referral:  CRR required this visit?  No       Plan:  I have personally reviewed and addressed the Medicare Annual Wellness questionnaire and have noted the following in the patient's chart:  A. Medical and social history B. Use of alcohol, tobacco or illicit drugs  C. Current medications and supplements D. Functional ability and status E.  Nutritional status F.  Physical activity G. Advance directives H. List of other physicians I.  Hospitalizations, surgeries, and ER visits in previous 12 months J.  [place] such as hearing and vision if needed, cognitive and depression L. Referrals and appointments   In addition, I have reviewed and discussed with patient certain preventive protocols, quality metrics, and best practice recommendations. A written personalized care plan for preventive services as well as general preventive health recommendations were provided to patient. Nurse Health Advisor  Signed,    Providence Stivers [place], [place]  [phone] Nurse Health Advisor   Nurse Notes: Pt would like to receive the Pneumovax 23 vaccine at next in office apt. Declined a colonoscopy referral, mammogram order and DEXA  order at this time.

## 2019-07-06 ENCOUNTER — Other Ambulatory Visit: Payer: Self-pay

## 2019-07-06 ENCOUNTER — Encounter: Payer: Medicare Other | Admitting: Family Medicine

## 2019-07-06 ENCOUNTER — Ambulatory Visit (INDEPENDENT_AMBULATORY_CARE_PROVIDER_SITE_OTHER): Payer: Medicare Other

## 2019-07-06 DIAGNOSIS — Z0000 Encounter for general adult medical examination without abnormal findings: Secondary | ICD-10-CM | POA: Diagnosis not present

## 2019-07-06 NOTE — Patient Instructions (Signed)
Christina Rubio , Thank you for taking time to come for your Medicare Wellness Visit. I appreciate your ongoing commitment to your health goals. Please review the following plan we discussed and let me know if I can assist you in the future.   Screening recommendations/referrals: Colonoscopy: Currently due. Declined referral today.  Mammogram: Currently due. Declined order today. Bone Density: Currently due. Declined order today.  Recommended yearly ophthalmology/optometry visit for glaucoma screening and checkup Recommended yearly dental visit for hygiene and checkup  Vaccinations: Influenza vaccine: Up to date Pneumococcal vaccine: Pneumovax 23 due. Tdap vaccine: Up to date Shingles vaccine: Dose # 2 due    Advanced directives: Advance directive discussed with you today. Even though you declined this today please call our office should you change your mind and we can give you the proper paperwork for you to fill out.  Conditions/risks identified: Recommend increasing water intake to 6-8 8 oz glasses a day.   Next appointment: 07/15/19 @ 3:00 PM with Dr Brita Romp   Preventive Care 68 Years and Older, Female Preventive care refers to lifestyle choices and visits with your health care provider that can promote health and wellness. What does preventive care include?  A yearly physical exam. This is also called an annual well check.  Dental exams once or twice a year.  Routine eye exams. Ask your health care provider how often you should have your eyes checked.  Personal lifestyle choices, including:  Daily care of your teeth and gums.  Regular physical activity.  Eating a healthy diet.  Avoiding tobacco and drug use.  Limiting alcohol use.  Practicing safe sex.  Taking low-dose aspirin every day.  Taking vitamin and mineral supplements as recommended by your health care provider. What happens during an annual well check? The services and screenings done by your health  care provider during your annual well check will depend on your age, overall health, lifestyle risk factors, and family history of disease. Counseling  Your health care provider may ask you questions about your:  Alcohol use.  Tobacco use.  Drug use.  Emotional well-being.  Home and relationship well-being.  Sexual activity.  Eating habits.  History of falls.  Memory and ability to understand (cognition).  Work and work Statistician.  Reproductive health. Screening  You may have the following tests or measurements:  Height, weight, and BMI.  Blood pressure.  Lipid and cholesterol levels. These may be checked every 5 years, or more frequently if you are over 68 years old.  Skin check.  Lung cancer screening. You may have this screening every year starting at age 68 if you have a 30-pack-year history of smoking and currently smoke or have quit within the past 15 years.  Fecal occult blood test (FOBT) of the stool. You may have this test every year starting at age 68.  Flexible sigmoidoscopy or colonoscopy. You may have a sigmoidoscopy every 5 years or a colonoscopy every 10 years starting at age 68.  Hepatitis C blood test.  Hepatitis B blood test.  Sexually transmitted disease (STD) testing.  Diabetes screening. This is done by checking your blood sugar (glucose) after you have not eaten for a while (fasting). You may have this done every 1-3 years.  Bone density scan. This is done to screen for osteoporosis. You may have this done starting at age 55.  Mammogram. This may be done every 1-2 years. Talk to your health care provider about how often you should have regular mammograms. Talk with  your health care provider about your test results, treatment options, and if necessary, the need for more tests. Vaccines  Your health care provider may recommend certain vaccines, such as:  Influenza vaccine. This is recommended every year.  Tetanus, diphtheria, and  acellular pertussis (Tdap, Td) vaccine. You may need a Td booster every 10 years.  Zoster vaccine. You may need this after age 52.  Pneumococcal 13-valent conjugate (PCV13) vaccine. One dose is recommended after age 51.  Pneumococcal polysaccharide (PPSV23) vaccine. One dose is recommended after age 7. Talk to your health care provider about which screenings and vaccines you need and how often you need them. This information is not intended to replace advice given to you by your health care provider. Make sure you discuss any questions you have with your health care provider. Document Released: 04/06/2015 Document Revised: 11/28/2015 Document Reviewed: 01/09/2015 Elsevier Interactive Patient Education  2017 [place] Prevention in the Home Falls can cause injuries. They can happen to people of all ages. There are many things you can do to make your home safe and to help prevent falls. What can I do on the outside of my home?  Regularly fix the edges of walkways and driveways and fix any cracks.  Remove anything that might make you trip as you walk through a door, such as a raised step or threshold.  Trim any bushes or trees on the path to your home.  Use bright outdoor lighting.  Clear any walking paths of anything that might make someone trip, such as rocks or tools.  Regularly check to see if handrails are loose or broken. Make sure that both sides of any steps have handrails.  Any raised decks and porches should have guardrails on the edges.  Have any leaves, snow, or ice cleared regularly.  Use sand or salt on walking paths during winter.  Clean up any spills in your garage right away. This includes oil or grease spills. What can I do in the bathroom?  Use night lights.  Install grab bars by the toilet and in the tub and shower. Do not use towel bars as grab bars.  Use non-skid mats or decals in the tub or shower.  If you need to sit down in the shower, use  a plastic, non-slip stool.  Keep the floor dry. Clean up any water that spills on the floor as soon as it happens.  Remove soap buildup in the tub or shower regularly.  Attach bath mats securely with double-sided non-slip rug tape.  Do not have throw rugs and other things on the floor that can make you trip. What can I do in the bedroom?  Use night lights.  Make sure that you have a light by your bed that is easy to reach.  Do not use any sheets or blankets that are too big for your bed. They should not hang down onto the floor.  Have a firm chair that has side arms. You can use this for support while you get dressed.  Do not have throw rugs and other things on the floor that can make you trip. What can I do in the kitchen?  Clean up any spills right away.  Avoid walking on wet floors.  Keep items that you use a lot in easy-to-reach places.  If you need to reach something above you, use a strong step stool that has a grab bar.  Keep electrical cords out of the way.  Do not  use floor polish or wax that makes floors slippery. If you must use wax, use non-skid floor wax.  Do not have throw rugs and other things on the floor that can make you trip. What can I do with my stairs?  Do not leave any items on the stairs.  Make sure that there are handrails on both sides of the stairs and use them. Fix handrails that are broken or loose. Make sure that handrails are as long as the stairways.  Check any carpeting to make sure that it is firmly attached to the stairs. Fix any carpet that is loose or worn.  Avoid having throw rugs at the top or bottom of the stairs. If you do have throw rugs, attach them to the floor with carpet tape.  Make sure that you have a light switch at the top of the stairs and the bottom of the stairs. If you do not have them, ask someone to add them for you. What else can I do to help prevent falls?  Wear shoes that:  Do not have high heels.  Have  rubber bottoms.  Are comfortable and fit you well.  Are closed at the toe. Do not wear sandals.  If you use a stepladder:  Make sure that it is fully opened. Do not climb a closed stepladder.  Make sure that both sides of the stepladder are locked into place.  Ask someone to hold it for you, if possible.  Clearly mark and make sure that you can see:  Any grab bars or handrails.  First and last steps.  Where the edge of each step is.  Use tools that help you move around (mobility aids) if they are needed. These include:  Canes.  Walkers.  Scooters.  Crutches.  Turn on the lights when you go into a dark area. Replace any light bulbs as soon as they burn out.  Set up your furniture so you have a clear path. Avoid moving your furniture around.  If any of your floors are uneven, fix them.  If there are any pets around you, be aware of where they are.  Review your medicines with your doctor. Some medicines can make you feel dizzy. This can increase your chance of falling. Ask your doctor what other things that you can do to help prevent falls. This information is not intended to replace advice given to you by your health care provider. Make sure you discuss any questions you have with your health care provider. Document Released: 01/04/2009 Document Revised: 08/16/2015 Document Reviewed: 04/14/2014 Elsevier Interactive Patient Education  2017 [employer].

## 2019-07-14 ENCOUNTER — Encounter: Payer: Medicare Other | Admitting: Family Medicine

## 2019-07-14 ENCOUNTER — Ambulatory Visit: Payer: BLUE CROSS/BLUE SHIELD | Admitting: Podiatry

## 2019-07-15 ENCOUNTER — Other Ambulatory Visit: Payer: Self-pay

## 2019-07-15 ENCOUNTER — Ambulatory Visit (INDEPENDENT_AMBULATORY_CARE_PROVIDER_SITE_OTHER): Payer: Medicare Other | Admitting: Family Medicine

## 2019-07-15 ENCOUNTER — Encounter: Payer: Self-pay | Admitting: Family Medicine

## 2019-07-15 VITALS — BP 121/73 | HR 58 | Temp 97.3°F | Resp 16 | Ht 64.0 in | Wt >= 6400 oz

## 2019-07-15 DIAGNOSIS — I878 Other specified disorders of veins: Secondary | ICD-10-CM | POA: Diagnosis not present

## 2019-07-15 DIAGNOSIS — R1310 Dysphagia, unspecified: Secondary | ICD-10-CM

## 2019-07-15 DIAGNOSIS — Z1211 Encounter for screening for malignant neoplasm of colon: Secondary | ICD-10-CM

## 2019-07-15 DIAGNOSIS — R7303 Prediabetes: Secondary | ICD-10-CM | POA: Diagnosis not present

## 2019-07-15 DIAGNOSIS — E034 Atrophy of thyroid (acquired): Secondary | ICD-10-CM

## 2019-07-15 DIAGNOSIS — I10 Essential (primary) hypertension: Secondary | ICD-10-CM | POA: Diagnosis not present

## 2019-07-15 DIAGNOSIS — Z23 Encounter for immunization: Secondary | ICD-10-CM

## 2019-07-15 DIAGNOSIS — E785 Hyperlipidemia, unspecified: Secondary | ICD-10-CM | POA: Diagnosis not present

## 2019-07-15 DIAGNOSIS — F411 Generalized anxiety disorder: Secondary | ICD-10-CM

## 2019-07-15 DIAGNOSIS — F332 Major depressive disorder, recurrent severe without psychotic features: Secondary | ICD-10-CM | POA: Diagnosis not present

## 2019-07-15 DIAGNOSIS — R1319 Other dysphagia: Secondary | ICD-10-CM

## 2019-07-15 NOTE — Assessment & Plan Note (Signed)
Pt with history of HTN well controlled on medication BP today was 121/73  Plan: Will check lipid panel Will check basic metabolic panel Will continue taking medications as prescribed

## 2019-07-15 NOTE — Progress Notes (Signed)
See note from [place], attested by me.

## 2019-07-15 NOTE — Progress Notes (Addendum)
Follow-up to AWV     Patient: Christina Rubio, Female    DOB: 1950/08/15, 69 y.o.   MRN: [id number] Visit Date: 07/15/2019  Today's Provider: Lavon Paganini, MD  Subjective:    Chief Complaint  Patient presents with  . Hypertension  . Depression  . Anxiety    She reports consuming a general diet. The patient does not participate in regular exercise at present. She generally feels fairly well. She reports sleeping fairly well. She does have additional problems to discuss today.   HPI  Hypertension, follow-up:  BP Readings from Last 3 Encounters:  07/15/19 121/73  03/04/19 132/72  01/13/19 114/73    She was last seen for hypertension 6 months ago.  BP at that visit was 114/73. Management changes since that visit include Valsartan-hydrochlorothiazde She reports excellent compliance with treatment. She is not having side effects.  She is not exercising. She is adherent to low salt diet.   Outside blood pressures are within normal limits. She is experiencing none.  Patient denies chest pain, chest pressure/discomfort, claudication, dyspnea, exertional chest pressure/discomfort, fatigue, irregular heart beat, lower extremity edema, near-syncope, palpitations, syncope and tachypnea.   Cardiovascular risk factors include advanced age (older than 43 for men, 46 for women), dyslipidemia, hypertension, obesity (BMI >= 30 kg/m2) and sedentary lifestyle.  Use of agents associated with hypertension: thyroid hormones.     Weight trend: increasing steadily Wt Readings from Last 3 Encounters:  07/15/19 (!) 403 lb (182.8 kg)  03/04/19 (!) 387 lb (175.5 kg)  01/13/19 (!) 387 lb (175.5 kg)    ------------------------------------------------------------------------  Prediabetes, Follow-up:   Lab Results  Component Value Date   HGBA1C 6.0 (H) 05/24/2018   GLUCOSE 176 (H) 09/11/2018   GLUCOSE 126 (H) 05/24/2018   GLUCOSE 107 (H) 05/28/2017    Last seen for for this6 months  ago.  Management since that visit includes low carb diet Current symptoms include none and have been stable.  Weight trend: increasing steadily Prior visit with dietician: no Current diet: not asked Current exercise: none  Pertinent Labs:    Component Value Date/Time   CHOL 170 05/24/2018 1153   TRIG 161 (H) 05/24/2018 1153   CHOLHDL 4.0 05/24/2018 1153   CREATININE 1.03 (H) 09/11/2018 2046    Wt Readings from Last 3 Encounters:  07/15/19 (!) 403 lb (182.8 kg)  03/04/19 (!) 387 lb (175.5 kg)  01/13/19 (!) 387 lb (175.5 kg)   --------------------------------------------------------------------------------  Lipid/Cholesterol, Follow-up:   Last seen for this6 months ago.  Management changes since that visit include atorvastatin. . Last Lipid Panel:    Component Value Date/Time   CHOL 170 05/24/2018 1153   TRIG 161 (H) 05/24/2018 1153   HDL 42 05/24/2018 1153   CHOLHDL 4.0 05/24/2018 1153   [place] 96 05/24/2018 1153    Risk factors for vascular disease include hypercholesterolemia and hypertension  She reports excellent compliance with treatment. She is not having side effects.  Current symptoms include none and have been stable. Weight trend: increasing steadily Prior visit with dietician: no Current diet: not asked Current exercise: none  Wt Readings from Last 3 Encounters:  07/15/19 (!) 403 lb (182.8 kg)  03/04/19 (!) 387 lb (175.5 kg)  01/13/19 (!) 387 lb (175.5 kg)    -------------------------------------------------------------------   Hypothyroid, follow-up:  TSH  Date Value Ref Range Status  05/24/2018 6.820 (H) 0.450 - 4.500 uIU/mL Final  05/28/2017 4.430 0.450 - 4.500 uIU/mL Final  02/13/2016 3.95 0.41 - 5.90 uIU/mL Final  Wt Readings from Last 3 Encounters:  07/15/19 (!) 403 lb (182.8 kg)  03/04/19 (!) 387 lb (175.5 kg)  01/13/19 (!) 387 lb (175.5 kg)    She was last seen for hypothyroid 6 months ago.  Management since that visit  includes levothyroxine. She reports excellent compliance with treatment. She is not having side effects.  She is not exercising. She is experiencing change in energy level and heat / cold intolerance She denies diarrhea Weight trend: increasing steadily  ------------------------------------------------------------------------   Follow up for MDD/Anxiety  The patient was last seen for this 6 months ago. Changes made at last visit include alprazolam and sertraline,   She reports excellent compliance with treatment. She feels that condition is Unchanged. She is not having side effects.   Pt states that she has a lot of stress and anxiety due to political circumstances Pt states that she has a lot of stress and anxiety over her daughter with ITP Pt states that the medications help her symptoms  ------------------------------------------------------------------------------------  Follow up for Esophageal Dysphagia  The patient was last seen for this October 2020 Changes made at last visit include referral to GI  Pt states that GI said it is likely esophageal spams Pt has not had any new symptoms  ------------------------------------------------------------------------------------ .  Follow up for Feet Swelling  Pt with worsening symptoms of feet swelling Pt states that that she has worsening swelling over the last month Pt states she has pain in her feet while walking Pain is described as dull and achy Pt has a history of venous stasis    ------------------------------------------------------------------------------------        Social History   Tobacco Use  . Smoking status: Never Smoker  . Smokeless tobacco: Never Used  Substance Use Topics  . Alcohol use: Yes    Comment: 1-2 drinks per year  . Drug use: No   Social History   Socioeconomic History  . Marital status: Widowed    Spouse name: Not on file  . Number of children: 1  . Years of education: 2  master's degrees  . Highest education level: Master's degree (e.g., MA, MS, MEng, MEd, MSW, MBA)  Occupational History  . Occupation: retired    Comment: Proofreader  Tobacco Use  . Smoking status: Never Smoker  . Smokeless tobacco: Never Used  Substance and Sexual Activity  . Alcohol use: Yes    Comment: 1-2 drinks per year  . Drug use: No  . Sexual activity: Never  Other Topics Concern  . Not on file  Social History Narrative  . Not on file   Social Determinants of Health   Financial Resource Strain: Low Risk   . Difficulty of Paying Living Expenses: Not hard at all  Food Insecurity: No Food Insecurity  . Worried About Charity fundraiser in the Last Year: Never true  . Ran Out of Food in the Last Year: Never true  Transportation Needs: No Transportation Needs  . Lack of Transportation (Medical): No  . Lack of Transportation (Non-Medical): No  Physical Activity: Inactive  . Days of Exercise per Week: 0 days  . Minutes of Exercise per Session: 0 min  Stress: Stress Concern Present  . Feeling of Stress : Very much  Social Connections: Moderately Isolated  . Frequency of Communication with Friends and Family: More than three times a week  . Frequency of Social Gatherings with Friends and Family: More than three times a week  . Attends Religious Services: Never  .  Active Member of Clubs or Organizations: No  . Attends Archivist Meetings: Never  . Marital Status: Widowed  Intimate Partner Violence: Not At Risk  . Fear of Current or Ex-Partner: No  . Emotionally Abused: No  . Physically Abused: No  . Sexually Abused: No       Medications: Outpatient Medications Prior to Visit  Medication Sig  . ALPRAZolam (XANAX) 0.5 MG tablet TAKE ONE TABLET BY MOUTH DAILY AS NEEDED FOR ANXIETY  . atorvastatin (LIPITOR) 10 MG tablet TAKE ONE TABLET BY MOUTH AT BEDTIME  . famotidine (PEPCID) 20 MG tablet Take 1 tablet (20 mg total) by mouth 2 (two) times daily.  Marland Kitchen  ketoconazole (NIZORAL) 2 % cream APPLY TOPICALLY ONCE DAILY UNTIL RESOLUTION  . levothyroxine (SYNTHROID) 75 MCG tablet TAKE ONE TABLET BY MOUTH EVERY NIGHT AT BEDTIME  . loperamide (IMODIUM A-D) 2 MG tablet Take 2 mg by mouth 4 (four) times daily as needed for diarrhea or loose stools.  . Multiple Vitamins-Minerals (PRESERVISION AREDS 2) CAPS Take 1 tablet by mouth 2 (two) times daily.  . ondansetron (ZOFRAN ODT) 4 MG disintegrating tablet Take 1 tablet (4 mg total) by mouth every 8 (eight) hours as needed.  . Probiotic Product (PROBIOTIC PO) Take by mouth daily.   . sertraline (ZOLOFT) 100 MG tablet Take 1.5 tablets (150 mg total) by mouth daily.  . valsartan-hydrochlorothiazide (DIOVAN-HCT) 160-25 MG tablet TAKE ONE TABLET BY MOUTH AT BEDTIME  . metoprolol succinate (TOPROL-XL) 50 MG 24 hr tablet Take 1 tablet (50 mg total) by mouth daily. Take with or immediately following a meal.  . [DISCONTINUED] fluconazole (DIFLUCAN) 150 MG tablet Take 1 tablet (150 mg total) by mouth once a week. (Patient not taking: Reported on 07/06/2019)   No facility-administered medications prior to visit.    Allergies  Allergen Reactions  . Epinephrine Palpitations    faint  . Penicillins Rash    Has patient had a PCN reaction causing immediate rash, facial/tongue/throat swelling, SOB or lightheadedness with hypotension:  Yes Has patient had a PCN reaction causing severe rash involving mucus membranes or skin necrosis: No Has patient had a PCN reaction that required hospitalization: No Has patient had a PCN reaction occurring within the last 10 years: No If all of the above answers are "NO", then may proceed with Cephalosporin use.;  . Strawberry Extract Rash    Patient Care Team: [name], MD as PCP - General (Family Medicine) Dorna Leitz, MD as Consulting Physician (Orthopedic Surgery) End, Harrell Gave, MD as Consulting Physician (Cardiology)  Review of Systems  Constitutional: Negative.     HENT: Positive for sinus pain.   Eyes: Negative.   Respiratory: Negative.   Cardiovascular: Positive for leg swelling.  Gastrointestinal: Negative.   Endocrine: Positive for cold intolerance, polydipsia and polyuria.  Genitourinary: Negative.   Musculoskeletal: Positive for back pain.  Skin: Positive for rash.  Allergic/Immunologic: Positive for food allergies.  Neurological: Negative.   Hematological: Negative.   Psychiatric/Behavioral: Positive for decreased concentration. The patient is nervous/anxious.     Last metabolic panel Lab Results  Component Value Date   GLUCOSE 176 (H) 09/11/2018   NA 133 (L) 09/11/2018   K 3.3 (L) 09/11/2018   CL 95 (L) 09/11/2018   CO2 26 09/11/2018   BUN 28 (H) 09/11/2018   CREATININE 1.03 (H) 09/11/2018   GFRNONAA 57 (L) 09/11/2018   GFRAA >60 09/11/2018   CALCIUM 8.8 (L) 09/11/2018   PROT 7.6 09/11/2018   ALBUMIN  4.0 09/11/2018   LABGLOB 3.1 05/24/2018   AGRATIO 1.4 05/24/2018   BILITOT 0.5 09/11/2018   ALKPHOS 38 09/11/2018   AST 22 09/11/2018   ALT 16 09/11/2018   ANIONGAP 12 09/11/2018   Last lipids Lab Results  Component Value Date   CHOL 170 05/24/2018   HDL 42 05/24/2018   LDLCALC 96 05/24/2018   TRIG 161 (H) 05/24/2018   CHOLHDL 4.0 05/24/2018   Last hemoglobin A1c Lab Results  Component Value Date   HGBA1C 6.0 (H) 05/24/2018   Last thyroid functions Lab Results  Component Value Date   TSH 6.820 (H) 05/24/2018       Objective:    Vitals: BP 121/73   Pulse (!) 58   Temp (!) 97.3 F (36.3 C) (Temporal)   Resp 16   Ht 5\' 4"  (1.626 m)   Wt (!) 403 lb (182.8 kg)   BMI 69.17 kg/m  BP Readings from Last 3 Encounters:  07/15/19 121/73  03/04/19 132/72  01/13/19 114/73   Wt Readings from Last 3 Encounters:  07/15/19 (!) 403 lb (182.8 kg)  03/04/19 (!) 387 lb (175.5 kg)  01/13/19 (!) 387 lb (175.5 kg)      Physical Exam Constitutional:      Appearance: Normal appearance. She is obese.  HENT:      Head: Normocephalic and atraumatic.     Right Ear: Tympanic membrane, ear canal and external ear normal.     Left Ear: Tympanic membrane, ear canal and external ear normal.     Nose: Nose normal.     Mouth/Throat:     Mouth: Mucous membranes are moist.     Dentition: Dental caries present.     Tongue: No lesions. Tongue does not deviate from midline.     Palate: No mass.     Pharynx: Oropharynx is clear.  Eyes:     Extraocular Movements: Extraocular movements intact.     Conjunctiva/sclera: Conjunctivae normal.     Pupils: Pupils are equal, round, and reactive to light.  Cardiovascular:     Rate and Rhythm: Normal rate and regular rhythm.     Pulses: Normal pulses.     Heart sounds: Normal heart sounds.  Pulmonary:     Effort: Pulmonary effort is normal.     Breath sounds: Normal breath sounds.  Abdominal:     General: Bowel sounds are normal.     Palpations: Abdomen is soft.     Tenderness: There is no abdominal tenderness. There is no guarding or rebound.  Musculoskeletal:        General: Swelling present.     Cervical back: Normal range of motion and neck supple.     Right lower leg: Tenderness present. No deformity, lacerations or bony tenderness. Pitting Edema present.     Left lower leg: Tenderness present. No deformity, lacerations or bony tenderness. Pitting Edema present.     Right ankle: Swelling present. Normal pulse.     Left ankle: Swelling present. Normal pulse.     Right foot: Swelling present. Normal pulse.     Left foot: Swelling present. Normal pulse.  Feet:     Right foot:     Skin integrity: Erythema and dry skin present.     Left foot:     Skin integrity: Erythema and dry skin present.  Skin:    General: Skin is warm and dry.     Capillary Refill: Capillary refill takes less than 2 seconds.  Neurological:  General: No focal deficit present.     Mental Status: She is alert and oriented to person, place, and time.     Cranial Nerves: No cranial nerve  deficit.     Sensory: No sensory deficit.     Motor: No weakness.     Deep Tendon Reflexes: Reflexes normal.  Psychiatric:        Mood and Affect: Mood normal.        Behavior: Behavior normal.      Most recent functional status assessment: In your present state of health, do you have any difficulty performing the following activities: 07/06/2019  Hearing? N  Vision? N  Difficulty concentrating or making decisions? N  Walking or climbing stairs? Y  Comment Due to feet pain.  Dressing or bathing? N  Doing errands, shopping? N  Preparing Food and eating ? Y  Comment Limited cooking due to mobility issues.  Using the Toilet? N  In the past six months, have you accidently leaked urine? Y  Comment With urges once standing.  Do you have problems with loss of bowel control? N  Managing your Medications? N  Managing your Finances? N  Housekeeping or managing your Housekeeping? Y  Comment Does limited cleaning due to pain and mobility issues.  Some recent data might be hidden    Most recent fall risk assessment: Fall Risk  07/06/2019  Falls in the past year? 1  Number falls in past yr: 0  Injury with Fall? 0  Follow up Falls prevention discussed     Most recent depression screenings: PHQ 2/9 Scores 07/06/2019 01/13/2019  PHQ - 2 Score 5 3  PHQ- 9 Score 16 19    Most recent cognitive screening: 6CIT Screen 07/06/2019  What Year? 0 points  What month? 0 points  What time? 0 points  Count back from 20 0 points  Months in reverse 0 points  Repeat phrase 0 points  Total Score 0    No results found for any visits on 07/15/19.    Assessment & Plan:       Problem List Items Addressed This Visit      Cardiovascular and Mediastinum   Essential hypertension, benign - Primary    Pt with history of HTN well controlled on medication BP today was 121/73  Plan: Will check lipid panel Will check basic metabolic panel Will continue taking medications as prescribed       Relevant Orders   Lipid Profile   Comprehensive Metabolic Panel (CMET)     Digestive   Esophageal dysphagia    Pt with an episode of sharp chest pain referred to GI GI states symptoms likely due to esophageal spasm  Plan: Will continue to monitor No further work-up is indicated at this time        Endocrine   Hypothyroidism due to acquired atrophy of thyroid    Pt with a history of hypothyroidism Pt states new symptoms of mild cold intolerance  Plan: Will check TSH today Continue taking levothyroxine as prescribed pending results of TSH       Relevant Orders   TSH     Other   Morbid obesity ([place]) (Chronic)    Pt with morbid obesity  Plan: Discussed with patient the importance of diet and exercise for healthy weight      Relevant Orders   HgB A1c   Lipid Profile   Dyslipidemia    Pt with a history of hypercholesterolemia well controlled on medication Pt is  taking atorvastatin  Plan Will check lipid panel Will continue taking medication as prescribed       Relevant Orders   Lipid Profile   Comprehensive Metabolic Panel (CMET)   MDD (major depressive disorder)    Pt with a history of MDD and GAD that is fairly controlled given social circumstances Pt has interest in referral to psychiatry for both counseling and medication management   Plan: Continue taking medication as prescribed Referral to psychiatry Will consider increasing zoloft to 200mg  if symptoms continue to persist      Relevant Orders   Ambulatory referral to Psychiatry   Venous stasis    Pt with a history of venous stasis  Pt with worsening symptoms of leg swelling Symptoms most consistent with venous stasis with dermatitis  Plan: Recommend wearing compression stockings Recommend elevating feet when possible Will consider referral to vascular if symptoms continue to persist      GAD (generalized anxiety disorder)    Pt with a history of MDD and GAD that is fairly controlled given  social circumstances Pt has interest in referral to psychiatry for both counseling and medication management   Plan: Continue taking medication as prescribed Referral to psychiatry Will consider increasing zoloft to 200mg  if symptoms continue to persist      Relevant Orders   Ambulatory referral to Psychiatry   Prediabetes    Pt with A1C of 6 at last visit  Plan: Will check A1C today Will continue to monitor Discussed with pt the importance of low carb diet for prediabetes management       Relevant Orders   HgB A1c   Lipid Profile   Comprehensive Metabolic Panel (CMET)    Other Visit Diagnoses    Need for Streptococcus pneumoniae vaccination       Relevant Orders   Pneumococcal polysaccharide vaccine 23-valent greater than or equal to 2yo subcutaneous/IM (Completed)   Screen for colon cancer       Relevant Orders   Cologuard      Declines mammogram and DEXA  Return in about 6 months (around 01/14/2020) for chronic follow up.     Chipper Herb, Medical Student [facility] [phone] (phone) [phone] (fax)  [place]

## 2019-07-15 NOTE — Assessment & Plan Note (Signed)
Pt with a history of hypothyroidism Pt states new symptoms of mild cold intolerance  Plan: Will check TSH today Continue taking levothyroxine as prescribed pending results of TSH

## 2019-07-15 NOTE — Patient Instructions (Signed)
Preventive Care 38 Years and Older, Female Preventive care refers to lifestyle choices and visits with your health care provider that can promote health and wellness. This includes:  A yearly physical exam. This is also called an annual well check.  Regular dental and eye exams.  Immunizations.  Screening for certain conditions.  Healthy lifestyle choices, such as diet and exercise. What can I expect for my preventive care visit? Physical exam Your health care provider will check:  Height and weight. These may be used to calculate body mass index (BMI), which is a measurement that tells if you are at a healthy weight.  Heart rate and blood pressure.  Your skin for abnormal spots. Counseling Your health care provider may ask you questions about:  Alcohol, tobacco, and drug use.  Emotional well-being.  Home and relationship well-being.  Sexual activity.  Eating habits.  History of falls.  Memory and ability to understand (cognition).  Work and work Statistician.  Pregnancy and menstrual history. What immunizations do I need?  Influenza (flu) vaccine  This is recommended every year. Tetanus, diphtheria, and pertussis (Tdap) vaccine  You may need a Td booster every 10 years. Varicella (chickenpox) vaccine  You may need this vaccine if you have not already been vaccinated. Zoster (shingles) vaccine  You may need this after age 33. Pneumococcal conjugate (PCV13) vaccine  One dose is recommended after age 33. Pneumococcal polysaccharide (PPSV23) vaccine  One dose is recommended after age 72. Measles, mumps, and rubella (MMR) vaccine  You may need at least one dose of MMR if you were born in 1957 or later. You may also need a second dose. Meningococcal conjugate (MenACWY) vaccine  You may need this if you have certain conditions. Hepatitis A vaccine  You may need this if you have certain conditions or if you travel or work in places where you may be exposed  to hepatitis A. Hepatitis B vaccine  You may need this if you have certain conditions or if you travel or work in places where you may be exposed to hepatitis B. Haemophilus influenzae type b (Hib) vaccine  You may need this if you have certain conditions. You may receive vaccines as individual doses or as more than one vaccine together in one shot (combination vaccines). Talk with your health care provider about the risks and benefits of combination vaccines. What tests do I need? Blood tests  Lipid and cholesterol levels. These may be checked every 5 years, or more frequently depending on your overall health.  Hepatitis C test.  Hepatitis B test. Screening  Lung cancer screening. You may have this screening every year starting at age 39 if you have a 30-pack-year history of smoking and currently smoke or have quit within the past 15 years.  Colorectal cancer screening. All adults should have this screening starting at age 36 and continuing until age 15. Your health care provider may recommend screening at age 23 if you are at increased risk. You will have tests every 1-10 years, depending on your results and the type of screening test.  Diabetes screening. This is done by checking your blood sugar (glucose) after you have not eaten for a while (fasting). You may have this done every 1-3 years.  Mammogram. This may be done every 1-2 years. Talk with your health care provider about how often you should have regular mammograms.  BRCA-related cancer screening. This may be done if you have a family history of breast, ovarian, tubal, or peritoneal cancers.  Other tests  Sexually transmitted disease (STD) testing.  Bone density scan. This is done to screen for osteoporosis. You may have this done starting at age 44. Follow these instructions at home: Eating and drinking  Eat a diet that includes fresh fruits and vegetables, whole grains, lean protein, and low-fat dairy products. Limit  your intake of foods with high amounts of sugar, saturated fats, and salt.  Take vitamin and mineral supplements as recommended by your health care provider.  Do not drink alcohol if your health care provider tells you not to drink.  If you drink alcohol: ? Limit how much you have to 0-1 drink a day. ? Be aware of how much alcohol is in your drink. In the [place] one drink equals one 12 oz bottle of beer (355 mL), one 5 oz glass of wine (148 mL), or one 1 oz glass of hard liquor (44 mL). Lifestyle  Take daily care of your teeth and gums.  Stay active. Exercise for at least 30 minutes on 5 or more days each week.  Do not use any products that contain nicotine or tobacco, such as cigarettes, e-cigarettes, and chewing tobacco. If you need help quitting, ask your health care provider.  If you are sexually active, practice safe sex. Use a condom or other form of protection in order to prevent STIs (sexually transmitted infections).  Talk with your health care provider about taking a low-dose aspirin or statin. What's next?  Go to your health care provider once a year for a well check visit.  Ask your health care provider how often you should have your eyes and teeth checked.  Stay up to date on all vaccines. This information is not intended to replace advice given to you by your health care provider. Make sure you discuss any questions you have with your health care provider. Document Revised: 03/04/2018 Document Reviewed: 03/04/2018 Elsevier Patient Education  2020 [employer].

## 2019-07-15 NOTE — Assessment & Plan Note (Signed)
Pt with a history of hypercholesterolemia well controlled on medication Pt is taking atorvastatin  Plan Will check lipid panel Will continue taking medication as prescribed

## 2019-07-15 NOTE — Assessment & Plan Note (Signed)
Pt with morbid obesity  Plan: Discussed with patient the importance of diet and exercise for healthy weight

## 2019-07-15 NOTE — Assessment & Plan Note (Signed)
Pt with an episode of sharp chest pain referred to GI GI states symptoms likely due to esophageal spasm  Plan: Will continue to monitor No further work-up is indicated at this time

## 2019-07-15 NOTE — Assessment & Plan Note (Signed)
Pt with a history of MDD and GAD that is fairly controlled given social circumstances Pt has interest in referral to psychiatry for both counseling and medication management   Plan: Continue taking medication as prescribed Referral to psychiatry Will consider increasing zoloft to 200mg  if symptoms continue to persist

## 2019-07-15 NOTE — Assessment & Plan Note (Signed)
Pt with a history of venous stasis  Pt with worsening symptoms of leg swelling Symptoms most consistent with venous stasis with dermatitis  Plan: Recommend wearing compression stockings Recommend elevating feet when possible Will consider referral to vascular if symptoms continue to persist

## 2019-07-15 NOTE — Assessment & Plan Note (Addendum)
Pt with A1C of 6 at last visit  Plan: Will check A1C today Will continue to monitor Discussed with pt the importance of low carb diet for prediabetes management

## 2019-07-25 ENCOUNTER — Ambulatory Visit: Payer: Medicare Other | Admitting: Podiatry

## 2019-07-26 ENCOUNTER — Telehealth: Payer: Self-pay

## 2019-07-26 NOTE — Telephone Encounter (Signed)
Copied from [place] [id number]. Topic: General - Other >> Jul 26, 2019 11:10 AM Yvette Rack wrote: Reason for CRM: Pt stated she recently saw Dr. B and she would like a Rx for Lasix due to foot swelling. Pt requests that the Rx be sent to [facility], [place] if approved.

## 2019-07-27 NOTE — Telephone Encounter (Signed)
She will need to get her labs done before we can start Lasix, because we need to see what her potassium as first.

## 2019-07-27 NOTE — Telephone Encounter (Signed)
LMTCB 07/27/2019.  PEC Please advise pt as below.   Thanks,   -Mickel Baas

## 2019-08-03 ENCOUNTER — Telehealth: Payer: Self-pay | Admitting: Internal Medicine

## 2019-08-03 NOTE — Telephone Encounter (Signed)
3 attempts to schedule fu appt from recall list.   Deleting recall.   

## 2019-08-13 ENCOUNTER — Other Ambulatory Visit: Payer: Self-pay | Admitting: Family Medicine

## 2019-08-13 DIAGNOSIS — F418 Other specified anxiety disorders: Secondary | ICD-10-CM

## 2019-08-13 NOTE — Telephone Encounter (Signed)
Requested Prescriptions  Pending Prescriptions Disp Refills  . valsartan-hydrochlorothiazide (DIOVAN-HCT) 160-25 MG tablet [Pharmacy Med Name: VALSARTAN-HCTZ 160-25 MG TAB] 90 tablet 1    Sig: TAKE ONE TABLET BY MOUTH AT BEDTIME     Cardiovascular: ARB + Diuretic Combos Failed - 08/13/2019  8:57 AM      Failed - K in normal range and within 180 days    Potassium  Date Value Ref Range Status  09/11/2018 3.3 (L) 3.5 - 5.1 mmol/L Final         Failed - Na in normal range and within 180 days    Sodium  Date Value Ref Range Status  09/11/2018 133 (L) 135 - 145 mmol/L Final  05/24/2018 137 134 - 144 mmol/L Final         Failed - Cr in normal range and within 180 days    Creatinine, Ser  Date Value Ref Range Status  09/11/2018 1.03 (H) 0.44 - 1.00 mg/dL Final         Failed - Ca in normal range and within 180 days    Calcium  Date Value Ref Range Status  09/11/2018 8.8 (L) 8.9 - 10.3 mg/dL Final         Passed - Patient is not pregnant      Passed - Last BP in normal range    BP Readings from Last 1 Encounters:  07/15/19 121/73         Passed - Valid encounter within last 6 months    Recent Outpatient Visits          4 weeks ago Essential hypertension, benign   [facility] [place], Dionne Bucy, MD   3 months ago Candidal intertrigo   [facility] [place], Dionne Bucy, MD   5 months ago Candidal intertrigo   [facility] [place], Dionne Bucy, MD   7 months ago Essential hypertension, benign   [facility], Dionne Bucy, MD   1 year ago Bacterial conjunctivitis of left eye   [place], Dionne Bucy, MD      Future Appointments            In 5 months Bacigalupo, Dionne Bucy, MD [facility], [facility]           . sertraline (ZOLOFT) 100 MG tablet [Pharmacy Med Name: SERTRALINE HCL 100 MG TABLET] 135 tablet 2    Sig: TAKE ONE AND ONE-HALF (1 & 1/2) TABLET BY MOUTH DAILY      Psychiatry:  Antidepressants - SSRI Passed - 08/13/2019  8:57 AM      Passed - Completed PHQ-2 or PHQ-9 in the last 360 days.      Passed - Valid encounter within last 6 months    Recent Outpatient Visits          4 weeks ago Essential hypertension, benign   [facility] [place], Dionne Bucy, MD   3 months ago Candidal intertrigo   [facility] [place], Dionne Bucy, MD   5 months ago Candidal intertrigo   [facility] [place], Dionne Bucy, MD   7 months ago Essential hypertension, benign   [facility], Dionne Bucy, MD   1 year ago Bacterial conjunctivitis of left eye   [facility] Bacigalupo, Dionne Bucy, MD      Future Appointments            In 5 months Bacigalupo, Dionne Bucy, MD [facility] [place], [place]

## 2019-10-01 ENCOUNTER — Other Ambulatory Visit: Payer: Self-pay | Admitting: Physician Assistant

## 2019-10-01 NOTE — Telephone Encounter (Signed)
Requested medication (s) are due for refill today: yes  Requested medication (s) are on the active medication list: yes  Last refill:  07/01/19  Future visit scheduled: yes  Notes to clinic:  overdue blood work   Requested Prescriptions  Pending Prescriptions Disp Refills   atorvastatin (LIPITOR) 10 MG tablet [Pharmacy Med Name: ATORVASTATIN 10 MG TABLET] 90 tablet 0    Sig: TAKE ONE TABLET BY MOUTH AT BEDTIME      Cardiovascular:  Antilipid - Statins Failed - 10/01/2019  9:20 AM      Failed - Total Cholesterol in normal range and within 360 days    Cholesterol, Total  Date Value Ref Range Status  05/24/2018 170 100 - 199 mg/dL Final          Failed - LDL in normal range and within 360 days    LDL Calculated  Date Value Ref Range Status  05/24/2018 96 0 - 99 mg/dL Final          Failed - HDL in normal range and within 360 days    HDL  Date Value Ref Range Status  05/24/2018 42 >39 mg/dL Final          Failed - Triglycerides in normal range and within 360 days    Triglycerides  Date Value Ref Range Status  05/24/2018 161 (H) 0 - 149 mg/dL Final          Passed - Patient is not pregnant      Passed - Valid encounter within last 12 months    Recent Outpatient Visits           2 months ago Essential hypertension, benign   [place], Dionne Bucy, MD   5 months ago Candidal intertrigo   [facility] [place], Dionne Bucy, MD   7 months ago Candidal intertrigo   [facility] [place], Dionne Bucy, MD   8 months ago Essential hypertension, benign   [facility] [place], Dionne Bucy, MD   1 year ago Bacterial conjunctivitis of left eye   [place], Dionne Bucy, MD       Future Appointments             In 3 months Bacigalupo, Dionne Bucy, MD [facility], [place]

## 2019-10-28 ENCOUNTER — Telehealth: Payer: Self-pay | Admitting: Family Medicine

## 2019-10-28 ENCOUNTER — Ambulatory Visit: Payer: Self-pay | Admitting: *Deleted

## 2019-10-28 ENCOUNTER — Other Ambulatory Visit: Payer: Self-pay | Admitting: Family Medicine

## 2019-10-28 MED ORDER — LEVOTHYROXINE SODIUM 75 MCG PO TABS: 75.0000 ug | ORAL_TABLET | Freq: Every day | ORAL | 0 refills | Status: DC

## 2019-10-28 NOTE — Telephone Encounter (Signed)
Medication: levothyroxine (SYNTHROID) 75 MCG tablet [[id number]]  - Patient is requesting one more refill before she has to come in for an appt. Her daughter normally brings her. The daughter is just getting out of the hospital August 25, 2019.   Has the patient contacted their pharmacy? YES (Agent: If no, request that the patient contact the pharmacy for the refill.) (Agent: If yes, when and what did the pharmacy advise?)  Preferred Pharmacy (with phone number or street name): [place], [place]  Phone:  [phone] Fax:  [phone]     Agent: Please be advised that RX refills may take up to 3 business days. We ask that you follow-up with your pharmacy.

## 2019-10-28 NOTE — Telephone Encounter (Deleted)
Medication: levothyroxine (SYNTHROID) 75 MCG tablet [[id number]]  - Patient is requesting one more refill before she has to come in for an appt. Her daughter normally brings her. The daughter is just getting out of the hospital August 25, 2019.   Has the patient contacted their pharmacy? {yes [id number]} (Agent: If no, request that the patient contact the pharmacy for the refill.) (Agent: If yes, when and what did the pharmacy advise?)  Preferred Pharmacy (with phone number or street name): [place], [place]  Phone:  [phone] Fax:  [phone]     Agent: Please be advised that RX refills may take up to 3 business days. We ask that you follow-up with your pharmacy.

## 2019-10-28 NOTE — Telephone Encounter (Addendum)
I returned pt's call.   She is supposed to come in and get blood work done that was ordered after her last visit.   (Orders are in Epic). She ran out of her Synthroid.  Has not had any since Wed. (7/28)    She will take her dose starting today to be back on schedule. Her question is how long does she need to wait after starting the Synthroid before she should have her blood work done which includes a TSH.  I let her know I would find out and call her back.    I spoke with Rashena.  He checked with one of the providers and was advised pt needed to be on the Synthroid for at least a month before having her TSH checked.  I thanked her for her help.  I also let Rashena know pt did not receive her Cologuard test.   They will refax the request to the company on Monday.  I called pt back and let her know to wait at least a month after starting the Synthroid which would be 9/3 if she starts today the earliest she could come in.  She was agreeable to this and will get the other labs drawn at the same time. I let her know the Cologuard request would be refaxed to the company on Monday.  She thanked me for my help.      Reason for Disposition . [1] Caller has NON-URGENT medicine question about med that PCP prescribed AND [2] triager unable to answer question  Answer Assessment - Initial Assessment Questions 1. NAME of MEDICATION: "What medicine are you calling about?"     Synthroid 2. QUESTION: "What is your question?" (e.g., medication refill, side effect)     Pt been off of Synthroid for 5 days.  It's been refilled.  How long does she need to take it before having her blood work done so the thyroid level will be accurate.   3. PRESCRIBING HCP: "Who prescribed it?" Reason: if prescribed by specialist, call should be referred to that group.     Fenton Malling during Dr. Nancy Nordmann leave. 4. SYMPTOMS: "Do you have any symptoms?"     N/A 5. SEVERITY: If symptoms are present, ask "Are they  mild, moderate or severe?"     N/A 6. PREGNANCY:  "Is there any chance that you are pregnant?" "When was your last menstrual period?"     N/A  Protocols used: MEDICATION QUESTION CALL-A-AH

## 2019-10-28 NOTE — Telephone Encounter (Deleted)
Medication: levothyroxine (SYNTHROID) 75 MCG tablet [[id number]]  - Patient is requesting one more refill before she has to come in for an appt. Her daughter normally brings her. The daughter is just getting out of the hospital August 25, 2019.   Has the patient contacted their pharmacy? {yes [id number]} (Agent: If no, request that the patient contact the pharmacy for the refill.) (Agent: If yes, when and what did the pharmacy advise?)  Preferred Pharmacy (with phone number or street name): [place], [place]  Phone:  [phone] Fax:  [phone]     Agent: Please be advised that RX refills may take up to 3 business days. We ask that you follow-up with your pharmacy.

## 2019-11-10 ENCOUNTER — Telehealth: Payer: Self-pay

## 2019-11-10 NOTE — Telephone Encounter (Signed)
We can write DME Rx for these.  Need to know what home care/DME company she wants [place] to fax to.

## 2019-11-10 NOTE — Telephone Encounter (Signed)
Copied from [place] [id number]. Topic: General - Inquiry >> Nov 10, 2019 12:09 PM Greggory Keen D wrote: Reason for CRM: Pt called asking if she could get an order written for a toilet assistant bar and a shower chair.  She is handicapped and thinks these would be helpful to her.  CB#  [phone]

## 2019-11-11 NOTE — Telephone Encounter (Signed)
Pt states her insurance won't cover the shower chair, but will cover the toilet bar.  She will call back when she finds out what company she wants to use.   Thanks,   -Mickel Baas

## 2019-11-16 ENCOUNTER — Other Ambulatory Visit: Payer: Self-pay | Admitting: Family Medicine

## 2019-11-16 ENCOUNTER — Other Ambulatory Visit: Payer: Self-pay | Admitting: Physician Assistant

## 2019-11-16 ENCOUNTER — Other Ambulatory Visit: Payer: Self-pay | Admitting: Internal Medicine

## 2019-11-16 NOTE — Telephone Encounter (Signed)
Requested medication (s) are due for refill today: yes  Requested medication (s) are on the active medication list: yes  Last refill:  07/01/19 #30 with 2 refills  Future visit scheduled: yes  Notes to clinic:  Please review for refill. Refills not delegated per protocol    Requested Prescriptions  Pending Prescriptions Disp Refills   ALPRAZolam (XANAX) 0.5 MG tablet [Pharmacy Med Name: ALPRAZolam 0.5 MG TABLET] 30 tablet     Sig: TAKE ONE TABLET BY MOUTH DAILY AS NEEDED FOR ANXIETY      Not Delegated - Psychiatry:  Anxiolytics/Hypnotics Failed - 11/16/2019 10:04 AM      Failed - This refill cannot be delegated      Failed - Urine Drug Screen completed in last 360 days.      Failed - Valid encounter within last 6 months    Recent Outpatient Visits           4 months ago Essential hypertension, benign   [facility] [place], Dionne Bucy, MD   6 months ago Candidal intertrigo   [facility] [place], Dionne Bucy, MD   8 months ago Candidal intertrigo   [facility] [place], Dionne Bucy, MD   10 months ago Essential hypertension, benign   [facility], Dionne Bucy, MD   1 year ago Bacterial conjunctivitis of left eye   [facility] Bacigal[place], Dionne Bucy, MD       Future Appointments             In 2 months Bacigalupo, Dionne Bucy, MD [facility], [place]

## 2019-11-16 NOTE — Telephone Encounter (Signed)
lmtcb

## 2019-11-16 NOTE — Telephone Encounter (Signed)
I can see this was prescribed previously, but no previous fills.  Is she taking it?  Filling it in [place]?

## 2019-11-17 ENCOUNTER — Telehealth: Payer: Self-pay

## 2019-11-17 NOTE — Telephone Encounter (Signed)
Copied from [place] [id number]. Topic: General - Inquiry >> Nov 17, 2019  4:00 PM Gillis Ends D wrote: Reason for CRM: Patient  called to give you the last time she got her alprazolam filled. The date was 10-01-2019. She asked that you go ahead and call in her medication. Please advise.

## 2019-11-17 NOTE — Telephone Encounter (Signed)
Copied from [place] [phone]. Topic: General - Call Back - No Documentation >> Nov 17, 2019  9:02 AM Hinda Lenis D wrote: PT miss a call / some left a msn about medications / please advise

## 2019-11-18 ENCOUNTER — Other Ambulatory Visit: Payer: Self-pay | Admitting: Family Medicine

## 2019-11-18 ENCOUNTER — Telehealth: Payer: Self-pay | Admitting: *Deleted

## 2019-11-18 MED ORDER — METOPROLOL SUCCINATE ER 50 MG PO TB24: 50.0000 mg | ORAL_TABLET | Freq: Every day | ORAL | 3 refills | Status: DC

## 2019-11-18 NOTE — Telephone Encounter (Signed)
Copied from [place] [phone]. Topic: Quick Communication - Rx Refill/Question >> Nov 18, 2019  9:37 AM Hinda Lenis D wrote: PT is confuse on her medications,  asking to speak with a nurse / please advise

## 2019-11-18 NOTE — Telephone Encounter (Signed)
Requested medication (s) are due for refill today: no  Requested medication (s) are on the active medication list: yes  Last refill:  10/03/2019  Future visit scheduled: yes  Notes to clinic:  Patient has appointment on 01/16/2020 Labs have not been done per last script

## 2019-12-12 ENCOUNTER — Ambulatory Visit: Payer: BLUE CROSS/BLUE SHIELD | Admitting: Podiatry

## 2019-12-23 ENCOUNTER — Other Ambulatory Visit: Payer: Self-pay | Admitting: Family Medicine

## 2019-12-23 NOTE — Telephone Encounter (Signed)
.  Requested medication (s) are due for refill today: Atorvastatin, yes  Requested medication (s) are on the active medication list: Yes  Last refill:  11/18/19  Future visit scheduled: yes  Notes to clinic: pharmacy note reads "PER PHYSICIAN, THIS IS THE LAST REFILL UNTIL LABS ARE DONE"  Requested medication (s) are due for refill today: Alprazolam, yes  Requested medication (s) are on the active medication list: yes  Last refill:  11/18/19  Future visit scheduled: yes  Notes to clinic:  Not delgated

## 2019-12-23 NOTE — Telephone Encounter (Signed)
Please review for Dr. B ° ° °Thanks,  ° °-Christina Rubio  °

## 2019-12-23 NOTE — Telephone Encounter (Signed)
Medication Refill - Medication: atorvastatin (LIPITOR) 10 MG tablet,ALPRAZolam (XANAX) 0.5 MG tablet (Patient also wants to know if atorvastatin 90 day supply due to the costs and the patient limited transportation.)  Has the patient contacted their pharmacy? yes (Agent: If no, request that the patient contact the pharmacy for the refill.) (Agent: If yes, when and what did the pharmacy advise?)Contact PCP  Preferred Pharmacy (with phone number or street name): [place], [place] Phone:  [phone]  Fax:  [phone]       Agent: Please be advised that RX refills may take up to 3 business days. We ask that you follow-up with your pharmacy.

## 2019-12-27 ENCOUNTER — Other Ambulatory Visit: Payer: Self-pay | Admitting: Physician Assistant

## 2019-12-27 MED ORDER — ATORVASTATIN CALCIUM 10 MG PO TABS: ORAL_TABLET | ORAL | 0 refills | Status: DC

## 2019-12-27 MED ORDER — ALPRAZOLAM 0.5 MG PO TABS: ORAL_TABLET | ORAL | 0 refills | Status: DC

## 2019-12-29 DIAGNOSIS — R7303 Prediabetes: Secondary | ICD-10-CM | POA: Diagnosis not present

## 2019-12-29 DIAGNOSIS — E034 Atrophy of thyroid (acquired): Secondary | ICD-10-CM | POA: Diagnosis not present

## 2019-12-29 DIAGNOSIS — E785 Hyperlipidemia, unspecified: Secondary | ICD-10-CM | POA: Diagnosis not present

## 2019-12-29 DIAGNOSIS — I10 Essential (primary) hypertension: Secondary | ICD-10-CM | POA: Diagnosis not present

## 2019-12-30 LAB — LIPID PANEL
Chol/HDL Ratio: 4.4 ratio (ref 0.0–4.4)
Cholesterol, Total: 190 mg/dL (ref 100–199)
HDL: 43 mg/dL (ref >39)
LDL Chol Calc (NIH): 113 mg/dL — ABNORMAL HIGH (ref 0–99)
Triglycerides: 194 mg/dL — ABNORMAL HIGH (ref 0–149)
VLDL Cholesterol Cal: 34 mg/dL (ref 5–40)

## 2019-12-30 LAB — COMPREHENSIVE METABOLIC PANEL
ALT: 21 IU/L (ref 0–32)
AST: 28 IU/L (ref 0–40)
Albumin/Globulin Ratio: 1.5 (ref 1.2–2.2)
Albumin: 4.7 g/dL (ref 3.8–4.8)
Alkaline Phosphatase: 41 IU/L — ABNORMAL LOW (ref 44–121)
BUN/Creatinine Ratio: 18 (ref 12–28)
BUN: 16 mg/dL (ref 8–27)
Bilirubin Total: 0.6 mg/dL (ref 0.0–1.2)
CO2: 23 mmol/L (ref 20–29)
Calcium: 9.8 mg/dL (ref 8.7–10.3)
Chloride: 99 mmol/L (ref 96–106)
Creatinine, Ser: 0.91 mg/dL (ref 0.57–1.00)
GFR calc Af Amer: 75 mL/min/{1.73_m2} (ref >59)
GFR calc non Af Amer: 65 mL/min/{1.73_m2} (ref >59)
Globulin, Total: 3.2 g/dL (ref 1.5–4.5)
Glucose: 146 mg/dL — ABNORMAL HIGH (ref 65–99)
Potassium: 4.1 mmol/L (ref 3.5–5.2)
Sodium: 138 mmol/L (ref 134–144)
Total Protein: 7.9 g/dL (ref 6.0–8.5)

## 2019-12-30 LAB — HEMOGLOBIN A1C
Est. average glucose Bld gHb Est-mCnc: 151 mg/dL
Hgb A1c MFr Bld: 6.9 % — ABNORMAL HIGH (ref 4.8–5.6)

## 2019-12-30 LAB — TSH: TSH: 6.9 u[IU]/mL — ABNORMAL HIGH (ref 0.450–4.500)

## 2020-01-05 ENCOUNTER — Telehealth: Payer: Self-pay

## 2020-01-05 MED ORDER — LEVOTHYROXINE SODIUM 88 MCG PO TABS: 88.0000 ug | ORAL_TABLET | Freq: Every day | ORAL | 3 refills | Status: DC

## 2020-01-05 NOTE — Telephone Encounter (Signed)
-----   Message from [name], MD sent at 01/03/2020 10:48 AM EDT ----- TSH remains elevated, meaning that Synthroid dose is too low.  If taking with good compliance, can increase to 88 mcg daily. Plan to recheck in 2 months.    A1c is elevated and prediabetes has worsened to diabetes.  Recommend appointment to discuss new diagnosis as well as screenings required for diabetes.  Cholesterol goal is much lower now too, so we should discuss that also.

## 2020-01-05 NOTE — Telephone Encounter (Signed)
Patient notified of lab results and PCP recommendations. Patient would like new dose sent to [facility]. Patient has an appointment already scheduled in a couple weeks and she would like to get her flu shot at that appointment too.

## 2020-01-05 NOTE — Telephone Encounter (Signed)
RX sent to [place].

## 2020-01-05 NOTE — Addendum Note (Signed)
Addended by: Jules Schick on: 01/05/2020 04:25 PM   Modules accepted: Orders

## 2020-01-05 NOTE — Telephone Encounter (Signed)
Attempted to contact patient, no answer left a voicemail. Okay for [facility] to advise patient.  

## 2020-01-16 ENCOUNTER — Encounter: Payer: Self-pay | Admitting: Family Medicine

## 2020-01-16 ENCOUNTER — Ambulatory Visit: Payer: Medicare Other | Admitting: Family Medicine

## 2020-01-16 ENCOUNTER — Ambulatory Visit: Payer: Medicare Other

## 2020-01-16 ENCOUNTER — Telehealth (INDEPENDENT_AMBULATORY_CARE_PROVIDER_SITE_OTHER): Payer: Medicare Other | Admitting: Family Medicine

## 2020-01-16 VITALS — BP 128/68 | HR 56

## 2020-01-16 DIAGNOSIS — R600 Localized edema: Secondary | ICD-10-CM | POA: Insufficient documentation

## 2020-01-16 DIAGNOSIS — E1169 Type 2 diabetes mellitus with other specified complication: Secondary | ICD-10-CM

## 2020-01-16 DIAGNOSIS — F332 Major depressive disorder, recurrent severe without psychotic features: Secondary | ICD-10-CM | POA: Diagnosis not present

## 2020-01-16 DIAGNOSIS — I10 Essential (primary) hypertension: Secondary | ICD-10-CM | POA: Diagnosis not present

## 2020-01-16 DIAGNOSIS — E034 Atrophy of thyroid (acquired): Secondary | ICD-10-CM

## 2020-01-16 DIAGNOSIS — E785 Hyperlipidemia, unspecified: Secondary | ICD-10-CM

## 2020-01-16 DIAGNOSIS — F411 Generalized anxiety disorder: Secondary | ICD-10-CM

## 2020-01-16 MED ORDER — ATORVASTATIN CALCIUM 20 MG PO TABS
20.0000 mg | ORAL_TABLET | Freq: Every day | ORAL | 3 refills | Status: DC
Start: 2020-01-16 — End: 2020-01-23

## 2020-01-16 NOTE — Assessment & Plan Note (Signed)
Chronic and uncontrolled Exacerbated in the setting of social circumstances currently with the poor health of her daughter and her upcoming divorce Contracted for safety-no SI/HI Previously referred to psychiatry, but appointment was not made New referral to psychiatry placed today Continue Zoloft at current dose Would also benefit from therapy

## 2020-01-16 NOTE — Assessment & Plan Note (Signed)
Longstanding issue No worsening Discussed with patient this is likely multifactorial related to venous stasis, morbid obesity Discussed importance of leg elevation and compression stockings Follow-up at next visit

## 2020-01-16 NOTE — Patient Instructions (Signed)

## 2020-01-16 NOTE — Assessment & Plan Note (Signed)
Hyperlipidemia previously well controlled Now with new diabetes diagnosis, lower LDL goal LDL goal less than 70 Discussed with the patient She is tolerating atorvastatin well We will increase to 20 mg daily At next visit, recheck CMP and lipid panel

## 2020-01-16 NOTE — Assessment & Plan Note (Signed)
Chronic and uncontrolled Worsening recently given social circumstances She was previously referred to psychiatry, but appointment was never made New referral placed to psychiatry today Continue Zoloft at current dose Contracted for safety-no SI/HI May also benefit from therapy

## 2020-01-16 NOTE — Progress Notes (Signed)
MyChart Video Visit    Virtual Visit via Video Note   This visit type was conducted due to national recommendations for restrictions regarding the COVID-19 Pandemic (e.g. social distancing) in an effort to limit this patient's exposure and mitigate transmission in our community. This patient is at least at moderate risk for complications without adequate follow up. This format is felt to be most appropriate for this patient at this time. Physical exam was limited by quality of the video and audio technology used for the visit.    Patient location: home Provider location: [place] involved in the visit: patient, provider  I discussed the limitations of evaluation and management by telemedicine and the availability of in person appointments. The patient expressed understanding and agreed to proceed.   Patient: Christina Rubio   DOB: 12/10/1950   69 y.o. Female  MRN: [id number] Visit Date: 01/16/2020  Today's healthcare provider: Lavon Paganini, MD   Chief Complaint  Patient presents with  . Follow-up   Subjective    HPI   Her daughter had a bone marrow transplant and seems to be doing really well. Now her husband is divorcing her.  Patient has been feeling nauseous and vomiting x1 day.  Thinks this is stress vs something that she ate.  Feet are very swollen for last few months.  This is bothersome to her.  Depression screen [facility] 2/9 07/06/2019 01/13/2019 05/11/2018 11/17/2017 07/28/2017  Decreased Interest 2 2 2 2 1   Down, Depressed, Hopeless 3 1 2 3  0  PHQ - 2 Score 5 3 4 5 1   Altered sleeping 3 3 2 3 1   Tired, decreased energy 3 3 2 2 1   Change in appetite 3 3 2 2 3   Feeling bad or failure about yourself  0 1 1 1  0  Trouble concentrating 2 3 1 1  0  Moving slowly or fidgety/restless 0 3 0 1 0  Suicidal thoughts 0 0 0 0 0  PHQ-9 Score 16 19 12 15 6   Difficult doing work/chores Very difficult Extremely dIfficult Somewhat difficult Very difficult  Somewhat difficult     Social History   Tobacco Use  . Smoking status: Never Smoker  . Smokeless tobacco: Never Used  Vaping Use  . Vaping Use: Never used  Substance Use Topics  . Alcohol use: Yes    Comment: 1-2 drinks per year  . Drug use: No      Medications: Outpatient Medications Prior to Visit  Medication Sig  . ALPRAZolam (XANAX) 0.5 MG tablet TAKE ONE TABLET BY MOUTH DAILY AS NEEDED FOR ANXIETY  . famotidine (PEPCID) 20 MG tablet TAKE ONE TABLET BY MOUTH TWICE A DAY  . ketoconazole (NIZORAL) 2 % cream APPLY TOPICALLY ONCE DAILY UNTIL RESOLUTION  . levothyroxine (SYNTHROID) 88 MCG tablet Take 1 tablet (88 mcg total) by mouth daily.  Marland Kitchen loperamide (IMODIUM A-D) 2 MG tablet Take 2 mg by mouth 4 (four) times daily as needed for diarrhea or loose stools.  . metoprolol succinate (TOPROL-XL) 50 MG 24 hr tablet Take 1 tablet (50 mg total) by mouth daily. Take with or immediately following a meal.  . Multiple Vitamins-Minerals (PRESERVISION AREDS 2) CAPS Take 1 tablet by mouth 2 (two) times daily.  . ondansetron (ZOFRAN ODT) 4 MG disintegrating tablet Take 1 tablet (4 mg total) by mouth every 8 (eight) hours as needed.  . Probiotic Product (PROBIOTIC PO) Take by mouth daily.   . sertraline (ZOLOFT) 100 MG tablet TAKE ONE  AND ONE-HALF (1 & 1/2) TABLET BY MOUTH DAILY  . valsartan-hydrochlorothiazide (DIOVAN-HCT) 160-25 MG tablet TAKE ONE TABLET BY MOUTH AT BEDTIME  . [DISCONTINUED] atorvastatin (LIPITOR) 10 MG tablet TAKE ONE TABLET BY MOUTH AT BEDTIME. PER PHYSICIAN, THIS IS THE LAST REFILL UNTIL LABS ARE DONE   No facility-administered medications prior to visit.    Review of Systems    Objective    BP 128/68   Pulse (!) 56    Physical Exam Constitutional:      General: She is not in acute distress.    Appearance: Normal appearance.     Comments: Laying in bed, not feeling well, NAD  Eyes:     Conjunctiva/sclera: Conjunctivae normal.  Pulmonary:     Effort:  Pulmonary effort is normal. No respiratory distress.  Neurological:     Mental Status: She is alert and oriented to person, place, and time. Mental status is at baseline.        Assessment & Plan     Problem List Items Addressed This Visit      Cardiovascular and Mediastinum   Essential hypertension, benign - Primary    Well controlled Continue current medications Recheck metabolic panel F/u in 6 months       Relevant Medications   atorvastatin (LIPITOR) 20 MG tablet     Endocrine   Hyperlipidemia associated with type 2 diabetes mellitus ([place])    Hyperlipidemia previously well controlled Now with new diabetes diagnosis, lower LDL goal LDL goal less than 70 Discussed with the patient She is tolerating atorvastatin well We will increase to 20 mg daily At next visit, recheck CMP and lipid panel      Relevant Medications   atorvastatin (LIPITOR) 20 MG tablet   Hypothyroidism due to acquired atrophy of thyroid    Uncontrolled on recent check Patient has increased dose of synthroid Recommend rechecking in 2 months      [name]    New diagnosis A1c 6.9 on recent labs Discussed progression from prediabetes to diabetes UTD on vaccines Recommend foot exam at next visit Recommend annual eye exam On ARB and statin We will not start medications at this time Discussed diet and exercise Follow-up in 3 months and repeat A1c      Relevant Medications   atorvastatin (LIPITOR) 20 MG tablet     Other   Morbid obesity (HCC) (Chronic)    Discussed importance of healthy weight management Discussed diet and exercise       MDD (major depressive disorder)    Chronic and uncontrolled Exacerbated in the setting of social circumstances currently with the poor health of her daughter and her upcoming divorce Contracted for safety-no SI/HI Previously referred to psychiatry, but appointment was not made New referral to psychiatry placed  today Continue Zoloft at current dose Would also benefit from therapy      Relevant Orders   Ambulatory referral to Psychiatry   GAD (generalized anxiety disorder)    Chronic and uncontrolled Worsening recently given social circumstances She was previously referred to psychiatry, but appointment was never made New referral placed to psychiatry today Continue Zoloft at current dose Contracted for safety-no SI/HI May also benefit from therapy      Relevant Orders   Ambulatory referral to Psychiatry   Bilateral lower extremity edema    Longstanding issue No worsening Discussed with patient this is likely multifactorial related to venous stasis, morbid obesity Discussed importance of leg elevation and compression stockings Follow-up  at next visit          Return in about 3 months (around 04/17/2020) for chronic disease f/u.     Total time spent on today's visit was greater than 40 minutes, including both face-to-face time and nonface-to-face time personally spent on review of chart (labs and imaging), discussing labs and goals, discussing further work-up, treatment options, referrals to specialist if needed, reviewing outside records of pertinent, answering patient's questions, and coordinating care.     I discussed the assessment and treatment plan with the patient. The patient was provided an opportunity to ask questions and all were answered. The patient agreed with the plan and demonstrated an understanding of the instructions.   The patient was advised to call back or seek an in-person evaluation if the symptoms worsen or if the condition fails to improve as anticipated.  I, Lavon Paganini, MD, have reviewed all documentation for this visit. The documentation on 01/16/20 for the exam, diagnosis, procedures, and orders are all accurate and complete.   Clydell Sposito, Dionne Bucy, MD, MPH [facility]

## 2020-01-16 NOTE — Assessment & Plan Note (Addendum)
New diagnosis A1c 6.9 on recent labs Discussed progression from prediabetes to diabetes UTD on vaccines Recommend foot exam at next visit Recommend annual eye exam On ARB and statin We will not start medications at this time Discussed diet and exercise Follow-up in 3 months and repeat A1c

## 2020-01-16 NOTE — Assessment & Plan Note (Signed)
Uncontrolled on recent check Patient has increased dose of synthroid Recommend rechecking in 2 months

## 2020-01-16 NOTE — Assessment & Plan Note (Signed)
Well controlled Continue current medications Recheck metabolic panel F/u in 6 months  

## 2020-01-16 NOTE — Assessment & Plan Note (Signed)
Discussed importance of healthy weight management Discussed diet and exercise  

## 2020-01-21 ENCOUNTER — Other Ambulatory Visit: Payer: Self-pay | Admitting: Physician Assistant

## 2020-01-23 ENCOUNTER — Other Ambulatory Visit: Payer: Self-pay | Admitting: Family Medicine

## 2020-01-23 MED ORDER — ATORVASTATIN CALCIUM 20 MG PO TABS
20.0000 mg | ORAL_TABLET | Freq: Every day | ORAL | 3 refills | Status: DC
Start: 2020-01-23 — End: 2021-03-11

## 2020-01-23 NOTE — Telephone Encounter (Signed)
Per notes- Rx not received by pharmacy- resent Rx

## 2020-01-23 NOTE — Telephone Encounter (Signed)
Christina Rubio states they never received the Rx atorvastatin (LIPITOR) 20 MG tablet  Pt is about to run out. Please advise if you can resend.

## 2020-01-27 ENCOUNTER — Other Ambulatory Visit: Payer: Self-pay | Admitting: Family Medicine

## 2020-01-27 NOTE — Telephone Encounter (Signed)
Patient states dosage from levothyroxine (SYNTHROID) 88 MCG tablet change and would like PCP to send in new  Rx .  Patient also requesting ALPRAZolam (XANAX) 0.5 MG tablet. Informed please allow 48 to 72 hour turn around time    [place], [place] Phone:  [phone]  Fax:  [phone]

## 2020-01-27 NOTE — Telephone Encounter (Signed)
Requested medication (s) are due for refill today - yes  Requested medication (s) are on the active medication list -yes  Future visit scheduled -no  Last refill: 12/27/19  Notes to clinic: Request RF non delegated Rx  Requested Prescriptions  Pending Prescriptions Disp Refills   ALPRAZolam (XANAX) 0.5 MG tablet 30 tablet 0    Sig: TAKE ONE TABLET BY MOUTH DAILY AS NEEDED FOR ANXIETY      Not Delegated - Psychiatry:  Anxiolytics/Hypnotics Failed - 01/27/2020 11:52 AM      Failed - This refill cannot be delegated      Failed - Urine Drug Screen completed in last 360 days      Passed - Valid encounter within last 6 months    Recent Outpatient Visits           1 week ago Essential hypertension, benign   [facility] [place], Dionne Bucy, MD   6 months ago Essential hypertension, benign   [facility] [place], Dionne Bucy, MD   9 months ago Candidal intertrigo   [facility] [place], Dionne Bucy, MD   10 months ago Candidal intertrigo   [facility] [place], Dionne Bucy, MD   1 year ago Essential hypertension, benign   [facility] [place], Dionne Bucy, MD               Refused Prescriptions Disp Refills   levothyroxine (SYNTHROID) 88 MCG tablet 90 tablet 3    Sig: Take 1 tablet (88 mcg total) by mouth daily.      Endocrinology:  Hypothyroid Agents Failed - 01/27/2020 11:52 AM      Failed - TSH needs to be rechecked within 3 months after an abnormal result. Refill until TSH is due.      Failed - TSH in normal range and within 360 days    TSH  Date Value Ref Range Status  12/29/2019 6.900 (H) 0.450 - 4.500 uIU/mL Final          Passed - Valid encounter within last 12 months    Recent Outpatient Visits           1 week ago Essential hypertension, benign   [facility] [place], Dionne Bucy, MD   6 months ago Essential hypertension, benign   [facility],  Dionne Bucy, MD   9 months ago Candidal intertrigo   [facility] [place], Dionne Bucy, MD   10 months ago Candidal intertrigo   [facility] [place], Dionne Bucy, MD   1 year ago Essential hypertension, benign   [facility] [place], Dionne Bucy, MD                  Requested Prescriptions  Pending Prescriptions Disp Refills   ALPRAZolam (XANAX) 0.5 MG tablet 30 tablet 0    Sig: TAKE ONE TABLET BY MOUTH DAILY AS NEEDED FOR ANXIETY      Not Delegated - Psychiatry:  Anxiolytics/Hypnotics Failed - 01/27/2020 11:52 AM      Failed - This refill cannot be delegated      Failed - Urine Drug Screen completed in last 360 days      Passed - Valid encounter within last 6 months    Recent Outpatient Visits           1 week ago Essential hypertension, benign   [facility] [place], Dionne Bucy, MD   6 months ago Essential hypertension, benign   [facility]  [name], MD   9 months ago Candidal intertrigo   [facility] [place], Dionne Bucy, MD   10 months ago Candidal intertrigo   [facility] [place], Dionne Bucy, MD   1 year ago Essential hypertension, benign   [facility] [place], Dionne Bucy, MD               Refused Prescriptions Disp Refills   levothyroxine (SYNTHROID) 88 MCG tablet 90 tablet 3    Sig: Take 1 tablet (88 mcg total) by mouth daily.      Endocrinology:  Hypothyroid Agents Failed - 01/27/2020 11:52 AM      Failed - TSH needs to be rechecked within 3 months after an abnormal result. Refill until TSH is due.      Failed - TSH in normal range and within 360 days    TSH  Date Value Ref Range Status  12/29/2019 6.900 (H) 0.450 - 4.500 uIU/mL Final          Passed - Valid encounter within last 12 months    Recent Outpatient Visits           1 week ago Essential hypertension, benign   [facility] [place], Dionne Bucy, MD   6 months ago Essential hypertension, benign   [facility] [place], Dionne Bucy, MD   9 months ago Candidal intertrigo   [facility] [place], Dionne Bucy, MD   10 months ago Candidal intertrigo   [facility] [place], Dionne Bucy, MD   1 year ago Essential hypertension, benign   [facility], Dionne Bucy, MD

## 2020-01-30 ENCOUNTER — Other Ambulatory Visit: Payer: Self-pay | Admitting: Physician Assistant

## 2020-01-30 MED ORDER — ALPRAZOLAM 0.5 MG PO TABS
ORAL_TABLET | ORAL | 0 refills | Status: DC
Start: 2020-01-30 — End: 2020-08-28

## 2020-01-30 NOTE — Telephone Encounter (Signed)
Requested medication (s) are due for refill today: yes  Requested medication (s) are on the active medication list: yes  Last refill:  12/27/19  Future visit scheduled: no  Notes to clinic:  not delegated    Requested Prescriptions  Pending Prescriptions Disp Refills   ALPRAZolam (XANAX) 0.5 MG tablet [Pharmacy Med Name: ALPRAZolam 0.5 MG TABLET] 30 tablet     Sig: TAKE ONE TABLET BY MOUTH DAILY AS NEEDED FOR ANXIETY      Not Delegated - Psychiatry:  Anxiolytics/Hypnotics Failed - 01/30/2020  9:12 AM      Failed - This refill cannot be delegated      Failed - Urine Drug Screen completed in last 360 days      Passed - Valid encounter within last 6 months    Recent Outpatient Visits           2 weeks ago Essential hypertension, benign   [facility] [place], Dionne Bucy, MD   6 months ago Essential hypertension, benign   [facility], Dionne Bucy, MD   9 months ago Candidal intertrigo   [facility] [place], Dionne Bucy, MD   11 months ago Candidal intertrigo   [facility] [place], Dionne Bucy, MD   1 year ago Essential hypertension, benign   [facility], Dionne Bucy, MD

## 2020-02-21 ENCOUNTER — Telehealth: Payer: Self-pay

## 2020-02-21 NOTE — Telephone Encounter (Signed)
Copied from [place] [id number]. Topic: General - Inquiry >> Feb 21, 2020  4:46 PM Greggory Keen D wrote: Reason for CRM: pt called saying Dr. B referred her to a mental health doctor but they can not see her until Feb.  She said she really needs to see someone before then if at all possible.  CB#  [phone]

## 2020-02-22 NOTE — Telephone Encounter (Signed)
We could see if anyone else is available before then.  Unfortunately, that is a very common wait time to see psychiatry.  Behavioral health at [facility] can sometimes see them sooner but this is for therapy and not med management.  If she needs emergent help, the behavioral health urgent care in [place] is a Microbiologist.

## 2020-02-22 NOTE — Telephone Encounter (Signed)
Patient advised as below. She states she will continue to call other places to check for appointments. Right now her appt with Dr. Nicolasa Ducking is scheduled for 05/07/2020.

## 2020-02-22 NOTE — Telephone Encounter (Signed)
Messaged referral coordinator for help.

## 2020-02-27 NOTE — Telephone Encounter (Signed)
Lenore called  stating that she has tried to contact pt to ask her to complete paperwork that has been emailed to her or come by their office to fill it out. I also left message for pt to return call to advise. They have had cancellations and trying to get pt in

## 2020-02-27 NOTE — Telephone Encounter (Signed)
What number does she need to call?

## 2020-02-29 ENCOUNTER — Other Ambulatory Visit: Payer: Self-pay | Admitting: Family Medicine

## 2020-02-29 NOTE — Telephone Encounter (Signed)
She will either need to fill out information that was emailed to her or go by their office to fill out. Dr Waylan Boga number is [phone]

## 2020-02-29 NOTE — Telephone Encounter (Signed)
Patient advised and will fill out forms asap.

## 2020-03-13 ENCOUNTER — Telehealth: Payer: Self-pay | Admitting: *Deleted

## 2020-03-13 NOTE — Chronic Care Management (AMB) (Signed)
  Chronic Care Management   Outreach Note  03/13/2020 Name: Christina Rubio MRN: [id number] DOB: 05-29-1950  Christina Rubio is a 69 y.o. year old female who is a primary care patient of Bacigalupo, Dionne Bucy, MD. I reached out to Fransisco Hertz by phone today in response to a referral sent by Ms. [facility]'s health plan.     A telephone outreach was attempted today patient was not available. The patient was referred to the case management team for assistance with care management and care coordination.   Follow Up Plan: The care management team will reach out to the patient again over the next 7 days. If patient returns call to provider office, please advise to call [place] at [phone].  [place] Management  Direct Dial: [phone]

## 2020-03-20 NOTE — Chronic Care Management (AMB) (Signed)
  Chronic Care Management   Outreach Note  03/20/2020 Name: Christina Rubio MRN: [id number] DOB: Nov 30, 1950  Christina Rubio is a 69 y.o. year old female who is a primary care patient of Bacigalupo, Marzella Schlein, MD. I reached out to [name] by phone today in response to a referral sent by [name]'s health plan.     A second unsuccessful telephone outreach was attempted today. The patient was referred to the case management team for assistance with care management and care coordination.   Follow Up Plan: A HIPAA compliant phone message was left for the patient providing contact information and requesting a return call. The care management team will reach out to the patient again over the next 7 days. If patient returns call to provider office, please advise to call Embedded Care Management Care Guide Gwenevere Ghazi at [phone].  Gwenevere Ghazi  Care Guide, Embedded Care Coordination [facility] Management  Direct Dial: [phone]

## 2020-03-27 NOTE — Chronic Care Management (AMB) (Signed)
  Chronic Care Management   Outreach Note  03/27/2020 Name: Aleza Pew MRN: [id number] DOB: 11-Jun-1950  Keiley Levey is a 70 y.o. year old female who is a primary care patient of Bacigalupo, Marzella Schlein, MD. I reached out to [name] by phone today in response to a referral sent by Ms. [facility]'s health plan.     Third unsuccessful telephone outreach was attempted today. The patient was referred to the case management team for assistance with care management and care coordination. The patient's primary care provider has been notified of our unsuccessful attempts to make or maintain contact with the patient. The care management team is pleased to engage with this patient at any time in the future should he/she be interested in assistance from the care management team.   Follow Up Plan: A HIPAA compliant phone message was left for the patient providing contact information and requesting a return call.  If patient returns call to provider office, please advise to call Embedded Care Management Care Guide Gwenevere Ghazi at [phone].  Gwenevere Ghazi  Care Guide, Embedded Care Coordination [facility] Management  Direct Dial: [phone]

## 2020-03-28 ENCOUNTER — Telehealth: Payer: Self-pay | Admitting: *Deleted

## 2020-03-28 NOTE — Chronic Care Management (AMB) (Signed)
  Chronic Care Management   Note  03/28/2020 Name: Christina Rubio MRN: [id number] DOB: 04/16/1950  Christina Rubio is a 70 y.o. year old female who is a primary care patient of Bacigalupo, Dionne Bucy, MD. I reached out to Christina Rubio by phone today in response to a referral sent by Christina Rubio's health plan.     Christina Rubio was given information about Chronic Care Management services today including:  1. CCM service includes personalized support from designated clinical staff supervised by her physician, including individualized plan of care and coordination with other care providers 2. 24/7 contact phone numbers for assistance for urgent and routine care needs. 3. Service will only be billed when office clinical staff spend 20 minutes or more in a month to coordinate care. 4. Only one practitioner may furnish and bill the service in a calendar month. 5. The patient may stop CCM services at any time (effective at the end of the month) by phone call to the office staff. 6. The patient will be responsible for cost sharing (co-pay) of up to 20% of the service fee (after annual deductible is met).  Patient did not agree to enrollment in care management services and does not wish to consider at this time.  Follow up plan: Patient declines engagement by the care management team. Appropriate care team members and provider have been notified via electronic communication.   [place] Management  Direct Dial: [phone]

## 2020-04-12 ENCOUNTER — Encounter: Payer: Self-pay | Admitting: Family Medicine

## 2020-04-12 ENCOUNTER — Ambulatory Visit: Payer: Medicare Other

## 2020-04-12 ENCOUNTER — Telehealth: Payer: Self-pay | Admitting: *Deleted

## 2020-04-12 ENCOUNTER — Ambulatory Visit: Payer: Self-pay | Admitting: *Deleted

## 2020-04-12 ENCOUNTER — Telehealth (INDEPENDENT_AMBULATORY_CARE_PROVIDER_SITE_OTHER): Payer: Medicare Other | Admitting: Family Medicine

## 2020-04-12 DIAGNOSIS — L03115 Cellulitis of right lower limb: Secondary | ICD-10-CM

## 2020-04-12 DIAGNOSIS — R600 Localized edema: Secondary | ICD-10-CM | POA: Diagnosis not present

## 2020-04-12 DIAGNOSIS — Z9189 Other specified personal risk factors, not elsewhere classified: Secondary | ICD-10-CM | POA: Diagnosis not present

## 2020-04-12 DIAGNOSIS — Z5982 Transportation insecurity: Secondary | ICD-10-CM

## 2020-04-12 MED ORDER — DOXYCYCLINE HYCLATE 100 MG PO TABS: 100.0000 mg | ORAL_TABLET | Freq: Two times a day (BID) | ORAL | 0 refills | Status: AC

## 2020-04-12 NOTE — Progress Notes (Signed)
MyChart Video Visit    Virtual Visit via Video Note   This visit type was conducted due to national recommendations for restrictions regarding the COVID-19 Pandemic (e.g. social distancing) in an effort to limit this patient's exposure and mitigate transmission in our community. This patient is at least at moderate risk for complications without adequate follow up. This format is felt to be most appropriate for this patient at this time. Physical exam was limited by quality of the video and audio technology used for the visit.    Patient location: home Provider location: home office Persons involved in the visit: patient, provider  I discussed the limitations of evaluation and management by telemedicine and the availability of in person appointments. The patient expressed understanding and agreed to proceed.  Patient: Christina Rubio   DOB: [birth year]   70 y.o. Female  MRN: [id number] Visit Date: 04/12/2020  Today's healthcare provider: Lavon Paganini, MD   Chief Complaint  Patient presents with  . Edema   Subjective    HPI   Edema: Patient complains of edema. The location of the edema is feet bilateral.  The edema has been severe, especially in the last week.   Onset of symptoms was several weeks ago, gradually worsening since that time. The edema is present all day.  The swelling has been aggravated by nothing, relieved by nothing, and been associated with nothing. Cardiac risk factors include obesity (BMI >= 30 kg/m2) and sedentary lifestyle.  Feet feel like they are cracking/oozing on the bottom, but unable to see that.    Pt would also like to discuss a referral to CCM (She is needing help to set up transportation for medical appointments.)   Patient Active Problem List   Diagnosis Date Noted  . Bilateral lower extremity edema 01/16/2020  . Candidal intertrigo 04/28/2019  . Esophageal dysphagia 01/13/2019  . Toenail deformity 01/13/2019  . Chest pain 09/22/2018  .  Bradycardia 09/22/2018  . GAD (generalized anxiety disorder) 05/24/2018  . T2DM (type 2 diabetes mellitus) ([place]) 05/24/2018  . Venous stasis 07/28/2017  . Spinal stenosis 05/28/2017  . DJD (degenerative joint disease) 05/28/2017  . Essential hypertension, benign 02/10/2016  . Hyperlipidemia associated with type 2 diabetes mellitus ([place]) 02/10/2016  . MDD (major depressive disorder) 02/10/2016  . Hypothyroidism due to acquired atrophy of thyroid 02/10/2016  . Morbid obesity ([place]) 01/09/2016   Past Surgical History:  Procedure Laterality Date  . CESAREAN SECTION    . CHOLECYSTECTOMY    . COLON SURGERY    . FEMUR IM NAIL Right 01/09/2016   Procedure: INTRAMEDULLARY (IM) RETROGRADE FEMORAL NAILING RIGHT DISTAL FEMUR;  Surgeon: Dorna Leitz, MD;  Location: [facility];  Service: Orthopedics;  Laterality: Right;  . LAPAROSCOPIC GASTRIC SLEEVE RESECTION    . Right thyroidectomy    . TONSILLECTOMY     Social History   Tobacco Use  . Smoking status: Never Smoker  . Smokeless tobacco: Never Used  Vaping Use  . Vaping Use: Never used  Substance Use Topics  . Alcohol use: Yes    Comment: 1-2 drinks per year  . Drug use: No   Allergies  Allergen Reactions  . Epinephrine Palpitations    faint  . Penicillins Rash    Has patient had a PCN reaction causing immediate rash, facial/tongue/throat swelling, SOB or lightheadedness with hypotension:  Yes Has patient had a PCN reaction causing severe rash involving mucus membranes or skin necrosis: No Has patient had a PCN reaction that required hospitalization:  No Has patient had a PCN reaction occurring within the last 10 years: No If all of the above answers are "NO", then may proceed with Cephalosporin use.;  . Strawberry Extract Rash    Medications: Outpatient Medications Prior to Visit  Medication Sig  . ALPRAZolam (XANAX) 0.5 MG tablet TAKE ONE TABLET BY MOUTH DAILY AS NEEDED FOR ANXIETY  . atorvastatin (LIPITOR) 20 MG tablet Take 1 tablet  (20 mg total) by mouth daily.  . famotidine (PEPCID) 20 MG tablet TAKE ONE TABLET BY MOUTH TWICE A DAY  . ketoconazole (NIZORAL) 2 % cream APPLY TOPICALLY ONCE DAILY UNTIL RESOLUTION  . levothyroxine (SYNTHROID) 88 MCG tablet Take 1 tablet (88 mcg total) by mouth daily.  Marland Kitchen loperamide (IMODIUM A-D) 2 MG tablet Take 2 mg by mouth 4 (four) times daily as needed for diarrhea or loose stools.  . metoprolol succinate (TOPROL-XL) 50 MG 24 hr tablet Take 1 tablet (50 mg total) by mouth daily. Take with or immediately following a meal.  . Multiple Vitamins-Minerals (PRESERVISION AREDS 2) CAPS Take 1 tablet by mouth 2 (two) times daily.  . ondansetron (ZOFRAN ODT) 4 MG disintegrating tablet Take 1 tablet (4 mg total) by mouth every 8 (eight) hours as needed.  . Probiotic Product (PROBIOTIC PO) Take by mouth daily.   . sertraline (ZOLOFT) 100 MG tablet TAKE ONE AND ONE-HALF (1 & 1/2) TABLET BY MOUTH DAILY  . valsartan-hydrochlorothiazide (DIOVAN-HCT) 160-25 MG tablet TAKE ONE TABLET BY MOUTH AT BEDTIME   No facility-administered medications prior to visit.    Review of Systems  Constitutional: Negative.   Respiratory: Negative.   Cardiovascular: Positive for leg swelling. Negative for chest pain and palpitations.  Skin: Positive for wound. Negative for color change, pallor and rash.    Objective    There were no vitals taken for this visit.   Physical Exam Constitutional:      General: She is not in acute distress.    Appearance: Normal appearance.  HENT:     Head: Normocephalic.  Pulmonary:     Effort: Pulmonary effort is normal. No respiratory distress.  Musculoskeletal:     Right lower leg: No edema.     Left lower leg: No edema.  Skin:    Findings: Erythema (of RLE with purulent drainage and TTP per patient) present.  Neurological:     Mental Status: She is alert and oriented to person, place, and time. Mental status is at baseline.        Assessment & Plan     1. Bilateral  lower extremity edema - worsening significantly over last 48 hours with asymmetry - concerning for possible DVT given sedentary state - will order STAT venous duplex b/l - US Venous Img Lower Bilateral; Future  2. Cellulitis of right leg - noted to have erythema, purulent drainage and TTP of RLE on virtual exam - HH RN for wound care as patient unable to reach to do this herself - treat with Doxy x7d - discussed return precautions - Ambulatory referral to [facility]  3. Lack of access to transportation - will refer to CCM for assistance with transportation and other social determinants of health - AMB Referral to [facility] ordered this encounter  Medications  . doxycycline (VIBRA-TABS) 100 MG tablet    Sig: Take 1 tablet (100 mg total) by mouth 2 (two) times daily for 7 days.    Dispense:  14 tablet    Refill:  0  No follow-ups on file.     I discussed the assessment and treatment plan with the patient. The patient was provided an opportunity to ask questions and all were answered. The patient agreed with the plan and demonstrated an understanding of the instructions.   The patient was advised to call back or seek an in-person evaluation if the symptoms worsen or if the condition fails to improve as anticipated.  I provided 30 minutes of non-face-to-face time during this encounter.  I, Lavon Paganini, MD, have reviewed all documentation for this visit. The documentation on 04/12/20 for the exam, diagnosis, procedures, and orders are all accurate and complete.   Macgregor Aeschliman, Dionne Bucy, MD, MPH [facility]

## 2020-04-12 NOTE — Telephone Encounter (Signed)
Patient calling with complaints of swelling to bilateral feet that goes up to the calf. Patient states that the right side is worse than the left. Patient states that the swelling has been occurring for a while. Patient states that feet feel dry,rough and stiff. Pt thinks that she may have aggravated the skin on her right foot with exfoliating and caused it to bleed.Patient states she is not able to see the bottom of foot but does she specks of blood on the floor. Patient states with lying on her back sometimes she has to let her leg hand off of the bed to the relieve the pressure. Patient denies any chest pain, SOB or other symptoms at this time. Patient states that her daughter recently had a bone marrow transplant and is her a source of transportation. Patient requesting virtual visit. MyChart virtual visit scheduled on 04/12/20 at 11:20 am.  Reason for Disposition . [1] Thigh, calf, or ankle swelling AND [2] bilateral AND [3] 1 side is more swollen  Answer Assessment - Initial Assessment Questions 1. ONSET: "When did the swelling start?" (e.g., minutes, hours, days)     Patient states that it has been going on for a while now 2. LOCATION: "What part of the leg is swollen?"  "Are both legs swollen or just one leg?"     Bilateral feet and legs up to calf 3. SEVERITY: "How bad is the swelling?" (e.g., localized; mild, moderate, severe)  - Localized - small area of swelling localized to one leg  - MILD pedal edema - swelling limited to foot and ankle, pitting edema < 1/4 inch (6 mm) deep, rest and elevation eliminate most or all swelling  - MODERATE edema - swelling of lower leg to knee, pitting edema > 1/4 inch (6 mm) deep, rest and elevation only partially reduce swelling  - SEVERE edema - swelling extends above knee, facial or hand swelling present      moderate 4. REDNESS: "Does the swelling look red or infected?"     Patient states that she does have sores on her legs due to scratching against  furniture 5. PAIN: "Is the swelling painful to touch?" If Yes, ask: "How painful is it?"   (Scale 1-10; mild, moderate or severe)     Yes painful with lying on the bed, has to hang leg off of bed to relieve pressure 6. FEVER: "Do you have a fever?" If Yes, ask: "What is it, how was it measured, and when did it start?"      Denies fever 7. CAUSE: "What do you think is causing the leg swelling?"     unknown 8. MEDICAL HISTORY: "Do you have a history of heart failure, kidney disease, liver failure, or cancer?"     yes 9. RECURRENT SYMPTOM: "Have you had leg swelling before?" If Yes, ask: "When was the last time?" "What happened that time?"     Not assessed 10. OTHER SYMPTOMS: "Do you have any other symptoms?" (e.g., chest pain, difficulty breathing)       No other symptoms voiced 11. PREGNANCY: "Is there any chance you are pregnant?" "When was your last menstrual period?"       n/a  Protocols used: LEG SWELLING AND EDEMA-A-AH

## 2020-04-12 NOTE — Patient Instructions (Signed)

## 2020-04-12 NOTE — Telephone Encounter (Signed)
Call dropped during transfer to triage nurse. Attempted to return call to patient x 2. LVM to return call to office to discuss symptoms of foot swelling.

## 2020-04-12 NOTE — Chronic Care Management (AMB) (Signed)
  Chronic Care Management   Note  04/12/2020 Name: Genasis Zingale MRN: [id number] DOB: 05/24/1950  Emilea Goga is a 70 y.o. year old female who is a primary care patient of Bacigalupo, Dionne Bucy, MD. I reached out to Fransisco Hertz by phone today in response to a referral sent by Ms. Nikola Mascio's PCP,Bacigalupo, Dionne Bucy, MD.    Ms. Aughenbaugh was given information about Chronic Care Management services today including:  1. CCM service includes personalized support from designated clinical staff supervised by her physician, including individualized plan of care and coordination with other care providers 2. 24/7 contact phone numbers for assistance for urgent and routine care needs. 3. Service will only be billed when office clinical staff spend 20 minutes or more in a month to coordinate care. 4. Only one practitioner may furnish and bill the service in a calendar month. 5. The patient may stop CCM services at any time (effective at the end of the month) by phone call to the office staff. 6. The patient will be responsible for cost sharing (co-pay) of up to 20% of the service fee (after annual deductible is met).  Patient agreed to services and verbal consent obtained.   Follow up plan: Telephone appointment with care management team member scheduled for:04/19/2020 Telephone appointment with care management team member scheduled for: 04/24/2020  [facility]

## 2020-04-13 ENCOUNTER — Ambulatory Visit: Payer: Medicare Other

## 2020-04-16 ENCOUNTER — Telehealth: Payer: Self-pay

## 2020-04-16 ENCOUNTER — Ambulatory Visit
Admission: RE | Admit: 2020-04-16 | Discharge: 2020-04-16 | Disposition: A | Discharge status: Home / Self Care | Payer: Medicare Other | Source: Ambulatory Visit | Attending: Family Medicine | Admitting: Family Medicine

## 2020-04-16 ENCOUNTER — Other Ambulatory Visit: Payer: Self-pay

## 2020-04-16 DIAGNOSIS — R600 Localized edema: Secondary | ICD-10-CM

## 2020-04-16 DIAGNOSIS — M7989 Other specified soft tissue disorders: Secondary | ICD-10-CM | POA: Diagnosis not present

## 2020-04-16 NOTE — Telephone Encounter (Signed)
Copied from [place] [id number]. Topic: General - Inquiry >> Apr 16, 2020 11:41 AM Gillis Ends D wrote: Reason for CRM: Tiffany from [facility] called and said that she already has orders for this patient but the patient would like to be seen on Wednesday 04-18-20 and she will follow up with the frequency. Please advise

## 2020-04-16 NOTE — Telephone Encounter (Signed)
   Telephone encounter was:  Successful.  04/16/2020 Name: Loneta Tamplin MRN: [id number] DOB: 1950/11/24  Kinya Meine is a 71 y.o. year old female who is a primary care patient of Bacigalupo, Dionne Bucy, MD . The community resource team was consulted for assistance with Transportation Needs   Care guide performed the following interventions: Patient provided with information about care guide support team and interviewed to confirm resource needs Discussed resources to assist with transportation Patient stateda friend would take her to her appointment today.  Gave patient information for [employer] transportation for future appointments.  .  Follow Up Plan:  Care guide will follow up with patient by phone over the next 4 days  Breslyn Abdo, AAS Paralegal, [place] . Embedded Care Coordination [facility]  Care Management  [address], [place] [address] ??[email or web address]  ?? [phone]   [email or web address]

## 2020-04-17 NOTE — Telephone Encounter (Signed)
This was just an FYI, due to patient not being seen with in 48 hours of referral.

## 2020-04-18 ENCOUNTER — Telehealth: Payer: Self-pay | Admitting: Family Medicine

## 2020-04-18 DIAGNOSIS — E7800 Pure hypercholesterolemia, unspecified: Secondary | ICD-10-CM | POA: Diagnosis not present

## 2020-04-18 DIAGNOSIS — R600 Localized edema: Secondary | ICD-10-CM | POA: Diagnosis not present

## 2020-04-18 DIAGNOSIS — E039 Hypothyroidism, unspecified: Secondary | ICD-10-CM | POA: Diagnosis not present

## 2020-04-18 DIAGNOSIS — F329 Major depressive disorder, single episode, unspecified: Secondary | ICD-10-CM | POA: Diagnosis not present

## 2020-04-18 DIAGNOSIS — R1319 Other dysphagia: Secondary | ICD-10-CM | POA: Diagnosis not present

## 2020-04-18 DIAGNOSIS — L03115 Cellulitis of right lower limb: Secondary | ICD-10-CM | POA: Diagnosis not present

## 2020-04-18 DIAGNOSIS — I872 Venous insufficiency (chronic) (peripheral): Secondary | ICD-10-CM | POA: Diagnosis not present

## 2020-04-18 DIAGNOSIS — E785 Hyperlipidemia, unspecified: Secondary | ICD-10-CM | POA: Diagnosis not present

## 2020-04-18 DIAGNOSIS — E1151 Type 2 diabetes mellitus with diabetic peripheral angiopathy without gangrene: Secondary | ICD-10-CM | POA: Diagnosis not present

## 2020-04-18 DIAGNOSIS — B372 Candidiasis of skin and nail: Secondary | ICD-10-CM | POA: Diagnosis not present

## 2020-04-18 DIAGNOSIS — E1169 Type 2 diabetes mellitus with other specified complication: Secondary | ICD-10-CM | POA: Diagnosis not present

## 2020-04-18 DIAGNOSIS — I10 Essential (primary) hypertension: Secondary | ICD-10-CM | POA: Diagnosis not present

## 2020-04-18 DIAGNOSIS — M1990 Unspecified osteoarthritis, unspecified site: Secondary | ICD-10-CM | POA: Diagnosis not present

## 2020-04-18 DIAGNOSIS — F411 Generalized anxiety disorder: Secondary | ICD-10-CM | POA: Diagnosis not present

## 2020-04-18 DIAGNOSIS — M4800 Spinal stenosis, site unspecified: Secondary | ICD-10-CM | POA: Diagnosis not present

## 2020-04-18 NOTE — Telephone Encounter (Signed)
Called for verbal orders: Skilled nursing - 2xwk 1, 3xwk 2, 2x wk 2, 1xwk 3 Add: evaluation for PT, Home Health aid 2xwk 8 to begin on 04/23/20 , Medical social work   If there are any questions, please call [phone]

## 2020-04-19 ENCOUNTER — Ambulatory Visit: Payer: BLUE CROSS/BLUE SHIELD | Admitting: *Deleted

## 2020-04-19 DIAGNOSIS — F411 Generalized anxiety disorder: Secondary | ICD-10-CM

## 2020-04-19 DIAGNOSIS — Z9189 Other specified personal risk factors, not elsewhere classified: Secondary | ICD-10-CM

## 2020-04-19 DIAGNOSIS — Z5982 Transportation insecurity: Secondary | ICD-10-CM

## 2020-04-19 DIAGNOSIS — F329 Major depressive disorder, single episode, unspecified: Secondary | ICD-10-CM

## 2020-04-19 NOTE — Telephone Encounter (Signed)
Ria Comment advised as below.

## 2020-04-19 NOTE — Telephone Encounter (Signed)
OK for verbals 

## 2020-04-20 ENCOUNTER — Ambulatory Visit: Payer: Self-pay | Admitting: *Deleted

## 2020-04-20 DIAGNOSIS — Z9189 Other specified personal risk factors, not elsewhere classified: Secondary | ICD-10-CM

## 2020-04-20 DIAGNOSIS — Z5982 Transportation insecurity: Secondary | ICD-10-CM

## 2020-04-20 DIAGNOSIS — F329 Major depressive disorder, single episode, unspecified: Secondary | ICD-10-CM

## 2020-04-20 DIAGNOSIS — F411 Generalized anxiety disorder: Secondary | ICD-10-CM

## 2020-04-20 NOTE — Chronic Care Management (AMB) (Signed)
Chronic Care Management    Clinical Social Work Note  04/20/2020 Name: Christina Rubio MRN: [id number] DOB: Jan 01, 1951  Christina Rubio is a 70 y.o. year old female who is a primary care patient of Bacigalupo, Dionne Bucy, MD. The CCM team was consulted to assist the patient with chronic disease management and/or care coordination needs related to: Transportation Needs , Emergency planning/management officer  and [place] and Resources.   Engaged with patient by telephone for follow up visit in response to provider referral for social work chronic care management and care coordination services.   Consent to Services:  The patient was given information about Chronic Care Management services, agreed to services, and gave verbal consent prior to initiation of services.  Please see initial visit note for detailed documentation.   Patient agreed to services and consent obtained.   Assessment: Review of patient past medical history, allergies, medications, and health status, including review of relevant consultants reports was performed today as part of a comprehensive evaluation and provision of chronic care management and care coordination services.     SDOH (Social Determinants of Health) assessments and interventions performed:    Advanced Directives Status: Not addressed in this encounter.  CCM Care Plan  Allergies  Allergen Reactions  . Epinephrine Palpitations    faint  . Penicillins Rash    Has patient had a PCN reaction causing immediate rash, facial/tongue/throat swelling, SOB or lightheadedness with hypotension:  Yes Has patient had a PCN reaction causing severe rash involving mucus membranes or skin necrosis: No Has patient had a PCN reaction that required hospitalization: No Has patient had a PCN reaction occurring within the last 10 years: No If all of the above answers are "NO", then may proceed with Cephalosporin use.;  . Strawberry Extract Rash    Outpatient Encounter  Medications as of 04/20/2020  Medication Sig  . ALPRAZolam (XANAX) 0.5 MG tablet TAKE ONE TABLET BY MOUTH DAILY AS NEEDED FOR ANXIETY  . atorvastatin (LIPITOR) 20 MG tablet Take 1 tablet (20 mg total) by mouth daily.  . famotidine (PEPCID) 20 MG tablet TAKE ONE TABLET BY MOUTH TWICE A DAY  . ketoconazole (NIZORAL) 2 % cream APPLY TOPICALLY ONCE DAILY UNTIL RESOLUTION  . levothyroxine (SYNTHROID) 88 MCG tablet Take 1 tablet (88 mcg total) by mouth daily.  Marland Kitchen loperamide (IMODIUM A-D) 2 MG tablet Take 2 mg by mouth 4 (four) times daily as needed for diarrhea or loose stools.  . metoprolol succinate (TOPROL-XL) 50 MG 24 hr tablet Take 1 tablet (50 mg total) by mouth daily. Take with or immediately following a meal.  . Multiple Vitamins-Minerals (PRESERVISION AREDS 2) CAPS Take 1 tablet by mouth 2 (two) times daily.  . ondansetron (ZOFRAN ODT) 4 MG disintegrating tablet Take 1 tablet (4 mg total) by mouth every 8 (eight) hours as needed.  . Probiotic Product (PROBIOTIC PO) Take by mouth daily.   . sertraline (ZOLOFT) 100 MG tablet TAKE ONE AND ONE-HALF (1 & 1/2) TABLET BY MOUTH DAILY  . valsartan-hydrochlorothiazide (DIOVAN-HCT) 160-25 MG tablet TAKE ONE TABLET BY MOUTH AT BEDTIME   No facility-administered encounter medications on file as of 04/20/2020.    Patient Active Problem List   Diagnosis Date Noted  . Bilateral lower extremity edema 01/16/2020  . Candidal intertrigo 04/28/2019  . Esophageal dysphagia 01/13/2019  . Toenail deformity 01/13/2019  . Chest pain 09/22/2018  . Bradycardia 09/22/2018  . GAD (generalized anxiety disorder) 05/24/2018  . T2DM (type 2 diabetes mellitus) ([place]) 05/24/2018  .  Venous stasis 07/28/2017  . Spinal stenosis 05/28/2017  . DJD (degenerative joint disease) 05/28/2017  . Essential hypertension, benign 02/10/2016  . Hyperlipidemia associated with type 2 diabetes mellitus ([place]) 02/10/2016  . MDD (major depressive disorder) 02/10/2016  . Hypothyroidism due  to acquired atrophy of thyroid 02/10/2016  . Morbid obesity ([place]) 01/09/2016    Conditions to be addressed/monitored: Depression and community resources; Transportation, Mental Health Concerns  and community resources  Care Plan : General Social Work (Adult)  Updates made by Vern Claude, LCSW since 04/20/2020 12:00 AM    Problem: Mobility and Independence     Goal: Mobility and Independence Optimized   Start Date: 04/19/2020  Expected End Date: 09/17/2020  Recent Progress: On track  Priority: High  Note:   Current Barriers:  . Transportation and ADL IADL limitations  Clinical Social Work Clinical Goal(s):  Marland Kitchen Over the next 90 days, patient will work with the community resources identified to address needs related to modifications needed to increase safe mobility in her home  Interventions: . 1:1 collaboration with Brita Romp, Dionne Bucy, MD regarding development and update of comprehensive plan of care as evidenced by provider attestation and co-signature . Inter-disciplinary care team collaboration (see longitudinal plan of care) . Collaboration phone call to the [employer] -spoke with [place] who agreed to install patient's grab bars in her garage and bathroom when the weather clears. . Phone call also made to the [place] as back up regarding request for grab bars . Phone call to patient to confirm that referral has been made and to expect a call in the next few weeks . Discussed plans with patient for ongoing care management follow up and provided patient with direct contact information for care management team   Patient Goal for self-Care Activities Over the next 90 days, patient will:   - Patient will self administer medications as prescribed Patient will attend all scheduled provider appointments - begin a notebook of services in my neighborhood or community - follow-up on any referrals for help I am given - think ahead to  make sure my need does not become an emergency - have a back-up plan  Follow up Plan: SW will follow up with patient by phone over the next 7-14 business days    Task: Maintain or Improve Mobility and Independence        Follow Up Plan: SW will follow up with patient by phone over the next 7-14 business days       [place], [place] Worker  [facility] Care Management [phone]

## 2020-04-20 NOTE — Chronic Care Management (AMB) (Signed)
Chronic Care Management    Clinical Social Work Note  04/20/2020 Name: Christina Rubio MRN: [id number] DOB: 1950/10/27  Christina Rubio is a 70 y.o. year old female who is a primary care patient of Bacigalupo, Christina Rubio. The CCM team was consulted to assist the patient with chronic disease management and/or care coordination needs related to: [employer] .   Engaged with patient by telephone for initial visit in response to provider referral for social work chronic care management and care coordination services.   Consent to Services:  The patient was given information about Chronic Care Management services, agreed to services, and gave verbal consent prior to initiation of services.  Please see initial visit note for detailed documentation.   Patient agreed to services and consent obtained.   Assessment: Review of patient past medical history, allergies, medications, and health status, including review of relevant consultants reports was performed today as part of a comprehensive evaluation and provision of chronic care management and care coordination services.     SDOH (Social Determinants of Health) assessments and interventions performed:    Advanced Directives Status: Not addressed in this encounter.  CCM Care Plan  Allergies  Allergen Reactions  . Epinephrine Palpitations    faint  . Penicillins Rash    Has patient had a PCN reaction causing immediate rash, facial/tongue/throat swelling, SOB or lightheadedness with hypotension:  Yes Has patient had a PCN reaction causing severe rash involving mucus membranes or skin necrosis: No Has patient had a PCN reaction that required hospitalization: No Has patient had a PCN reaction occurring within the last 10 years: No If all of the above answers are "NO", then may proceed with Cephalosporin use.;  . Strawberry Extract Rash    Outpatient Encounter Medications as of 04/19/2020  Medication Sig  . ALPRAZolam (XANAX) 0.5 MG  tablet TAKE ONE TABLET BY MOUTH DAILY AS NEEDED FOR ANXIETY  . atorvastatin (LIPITOR) 20 MG tablet Take 1 tablet (20 mg total) by mouth daily.  . [EXPIRED] doxycycline (VIBRA-TABS) 100 MG tablet Take 1 tablet (100 mg total) by mouth 2 (two) times daily for 7 days.  . famotidine (PEPCID) 20 MG tablet TAKE ONE TABLET BY MOUTH TWICE A DAY  . ketoconazole (NIZORAL) 2 % cream APPLY TOPICALLY ONCE DAILY UNTIL RESOLUTION  . levothyroxine (SYNTHROID) 88 MCG tablet Take 1 tablet (88 mcg total) by mouth daily.  Marland Kitchen loperamide (IMODIUM A-D) 2 MG tablet Take 2 mg by mouth 4 (four) times daily as needed for diarrhea or loose stools.  . metoprolol succinate (TOPROL-XL) 50 MG 24 hr tablet Take 1 tablet (50 mg total) by mouth daily. Take with or immediately following a meal.  . Multiple Vitamins-Minerals (PRESERVISION AREDS 2) CAPS Take 1 tablet by mouth 2 (two) times daily.  . ondansetron (ZOFRAN ODT) 4 MG disintegrating tablet Take 1 tablet (4 mg total) by mouth every 8 (eight) hours as needed.  . Probiotic Product (PROBIOTIC PO) Take by mouth daily.   . sertraline (ZOLOFT) 100 MG tablet TAKE ONE AND ONE-HALF (1 & 1/2) TABLET BY MOUTH DAILY  . valsartan-hydrochlorothiazide (DIOVAN-HCT) 160-25 MG tablet TAKE ONE TABLET BY MOUTH AT BEDTIME   No facility-administered encounter medications on file as of 04/19/2020.    Patient Active Problem List   Diagnosis Date Noted  . Bilateral lower extremity edema 01/16/2020  . Candidal intertrigo 04/28/2019  . Esophageal dysphagia 01/13/2019  . Toenail deformity 01/13/2019  . Chest pain 09/22/2018  . Bradycardia 09/22/2018  . GAD (generalized  anxiety disorder) 05/24/2018  . T2DM (type 2 diabetes mellitus) ([place]) 05/24/2018  . Venous stasis 07/28/2017  . Spinal stenosis 05/28/2017  . DJD (degenerative joint disease) 05/28/2017  . Essential hypertension, benign 02/10/2016  . Hyperlipidemia associated with type 2 diabetes mellitus ([place]) 02/10/2016  . MDD (major  depressive disorder) 02/10/2016  . Hypothyroidism due to acquired atrophy of thyroid 02/10/2016  . Morbid obesity ([place]) 01/09/2016    Conditions to be addressed/monitored: Anxiety, Depression and community resources; Transportation and Mental Health Concerns   Care Plan : General Social Work (Adult)  Updates made by Christina Claude, LCSW since 04/20/2020 12:00 AM    Problem: Mobility and Independence     Goal: Mobility and Independence Optimized   Start Date: 04/19/2020  Expected End Date: 09/17/2020  This Visit's Progress: On track  Priority: High  Note:   Current Barriers:  . Transportation and ADL IADL limitations  Clinical Social Work Clinical Goal(s):  Marland Kitchen Over the next 90 days, patient will work with the community resources identified to address needs related to modifications needed to increase safe mobility in her home  Interventions: . 1:1 collaboration with Christina Rubio, Christina Rubio regarding development and update of comprehensive plan of care as evidenced by provider attestation and co-signature . Inter-disciplinary care team collaboration (see longitudinal plan of care) . Patient interviewed and appropriate assessments performed . Patient discussed her physical limitations due to her medical issues stating need for grab bars in her bathroom and garage to avoid falls . Patient has been provided with community resource information for transportation received by the care guides, however patient requesting additional information in terms of if the Lucianne Lei can come inside her garage and if grab bars can be installed . Patient does have mental health challenges and was able to confirm that she has an appointment with [facility] on 05/07/20 . Patient openly discussed challenges with her daughters health, emotional support provided, engagement with [facility] . Provided patient with information about various church ministries along with  the [employer] that could possibly assist with the grab bars for her home-this Education officer, museum will call to confirm . Discussed plans with patient for ongoing care management follow up and provided patient with direct contact information for care management team   Patient Goal for self-Care Activities Over the next 90 days, patient will:   - Patient will self administer medications as prescribed Patient will attend all scheduled provider appointments - begin a notebook of services in my neighborhood or community - follow-up on any referrals for help I am given - think ahead to make sure my need does not become an emergency - have a back-up plan  Follow up Plan: SW will follow up with patient by phone over the next 7-14 business days    Task: Maintain or Improve Mobility and Independence        Follow Up Plan: SW will follow up with patient by phone over the next 05/01/20       Elliot Gurney, [place] Worker  [facility] Care Management [phone]

## 2020-04-20 NOTE — Patient Instructions (Signed)
Visit Information  Patient Care Plan: General Social Work (Adult)    Problem Identified: Mobility and Independence     Goal: Mobility and Independence Optimized   Start Date: 04/19/2020  Expected End Date: 09/17/2020  This Visit's Progress: On track  Priority: High  Note:   Current Barriers:  . Transportation and ADL IADL limitations  Clinical Social Work Clinical Goal(s):  Marland Kitchen Over the next 90 days, patient will work with the community resources identified to address needs related to modifications needed to increase safe mobility in her home  Interventions: . 1:1 collaboration with Brita Romp, Dionne Bucy, MD regarding development and update of comprehensive plan of care as evidenced by provider attestation and co-signature . Inter-disciplinary care team collaboration (see longitudinal plan of care) . Patient interviewed and appropriate assessments performed . Patient discussed her physical limitations due to her medical issues stating need for grab bars in her bathroom and garage to avoid falls . Patient has been provided with community resource information for transportation received by the care guides, however patient requesting additional information in terms of if the Lucianne Lei can come inside her garage and if grab bars can be installed . Patient does have mental health challenges and was able to confirm that she has an appointment with [facility] [facility] on 05/07/20 . Patient openly discussed challenges with her daughters health, emotional support provided, engagement with [facility] . Provided patient with information about various church ministries along with the [employer] that could possibly assist with the grab bars for her home-this Education officer, museum will call to confirm . Discussed plans with patient for ongoing care management follow up and provided patient with direct contact information for care management team   Patient Goal  for self-Care Activities Over the next 90 days, patient will:   - Patient will self administer medications as prescribed Patient will attend all scheduled provider appointments - begin a notebook of services in my neighborhood or community - follow-up on any referrals for help I am given - think ahead to make sure my need does not become an emergency - have a back-up plan  Follow up Plan: SW will follow up with patient by phone over the next 7-14 business days    Task: Maintain or Improve Mobility and Independence       [name] was given information about Care Management services today including:  1. Care Management services include personalized support from designated clinical staff supervised by her physician, including individualized plan of care and coordination with other care providers 2. 24/7 contact phone numbers for assistance for urgent and routine care needs. 3. The patient may stop CCM services at any time (effective at the end of the month) by phone call to the office staff.  Patient agreed to services and verbal consent obtained.   The patient verbalized understanding of instructions, educational materials, and care plan provided today and declined offer to receive copy of patient instructions, educational materials, and care plan.   Telephone follow up appointment with care management team member scheduled for: 05/01/20   Elliot Gurney, [place] Worker  [facility] Care Management [phone]

## 2020-04-20 NOTE — Patient Instructions (Signed)
Visit Information  Patient Care Plan: General Social Work (Adult)    Problem Identified: Mobility and Independence     Goal: Mobility and Independence Optimized   Start Date: 04/19/2020  Expected End Date: 09/17/2020  Recent Progress: On track  Priority: High  Note:   Current Barriers:  . Transportation and ADL IADL limitations  Clinical Social Work Clinical Goal(s):  Marland Kitchen Over the next 90 days, patient will work with the community resources identified to address needs related to modifications needed to increase safe mobility in her home  Interventions: . 1:1 collaboration with Brita Romp, Dionne Bucy, MD regarding development and update of comprehensive plan of care as evidenced by provider attestation and co-signature . Inter-disciplinary care team collaboration (see longitudinal plan of care) . Collaboration phone call to the [employer] -spoke with [place] who agreed to install patient's grab bars in her garage and bathroom when the weather clears. . Phone call also made to the [place] as back up regarding request for grab bars . Phone call to patient to confirm that referral has been made and to expect a call in the next few weeks . Discussed plans with patient for ongoing care management follow up and provided patient with direct contact information for care management team   Patient Goal for self-Care Activities Over the next 90 days, patient will:   - Patient will self administer medications as prescribed Patient will attend all scheduled provider appointments - begin a notebook of services in my neighborhood or community - follow-up on any referrals for help I am given - think ahead to make sure my need does not become an emergency - have a back-up plan  Follow up Plan: SW will follow up with patient by phone over the next 7-14 business days    Task: Maintain or Improve Mobility and Independence       The patient verbalized  understanding of instructions, educational materials, and care plan provided today and declined offer to receive copy of patient instructions, educational materials, and care plan.   Telephone follow up appointment with care management team member scheduled for: 05/01/20    Elliot Gurney, [place] Worker  [facility] Care [facility] [phone]

## 2020-04-24 ENCOUNTER — Ambulatory Visit (INDEPENDENT_AMBULATORY_CARE_PROVIDER_SITE_OTHER): Payer: Medicare Other

## 2020-04-24 ENCOUNTER — Telehealth: Payer: Self-pay

## 2020-04-24 DIAGNOSIS — I10 Essential (primary) hypertension: Secondary | ICD-10-CM

## 2020-04-24 DIAGNOSIS — F329 Major depressive disorder, single episode, unspecified: Secondary | ICD-10-CM

## 2020-04-24 DIAGNOSIS — L03115 Cellulitis of right lower limb: Secondary | ICD-10-CM

## 2020-04-24 DIAGNOSIS — E1169 Type 2 diabetes mellitus with other specified complication: Secondary | ICD-10-CM

## 2020-04-24 NOTE — Telephone Encounter (Signed)
   Telephone encounter was:  Successful.  04/24/2020 Name: Christina Rubio MRN: [id number] DOB: 02/04/1951  Christina Rubio is a 70 y.o. year old female who is a primary care patient of Bacigalupo, Dionne Bucy, MD . The community resource team was consulted for assistance with Transportation Needs   Care guide performed the following interventions: Follow up call placed to the patient to discuss status of referral Patient has information for [employer] transportation and will call to schedule rides for her appointments..  Follow Up Plan:  No further follow up planned at this time. The patient has been provided with needed resources.  Selicia Windom, AAS Paralegal, [place] . Embedded Care Coordination [facility]  Care Management  [address], [place] [address] ??[email or web address]  ?? [phone]   [email or web address]

## 2020-04-26 ENCOUNTER — Telehealth: Payer: Self-pay | Admitting: Family Medicine

## 2020-04-26 NOTE — Telephone Encounter (Signed)
Left detailed voice mail for [place].

## 2020-04-26 NOTE — Telephone Encounter (Signed)
Spoke to [place] with [facility], she reports that patient's leg is not improving. Appt has been scheduled for tomorrow with Fabio Bering.

## 2020-04-26 NOTE — Telephone Encounter (Signed)
Ok to reorder if requested by nurse. Never saw request

## 2020-04-26 NOTE — Telephone Encounter (Signed)
OK for verbals 

## 2020-04-26 NOTE — Telephone Encounter (Signed)
The Rx Pt was prescribed for her leg infection has run out and the home health nurse called to get a refill for antibiotic sent [facility] / Pt states the nurse has not heard back / so Pt is calling for the status/ please advise if another round will be sent  doxycycline (VIBRA-TABS) 100 MG tablet

## 2020-04-26 NOTE — Telephone Encounter (Signed)
Christina Rubio with Christina Rubio has called and states that pt cancelled evaluation and he states he needs a verbal order in order to resch pt FU with Christina Rubio at [phone]

## 2020-04-27 ENCOUNTER — Ambulatory Visit: Payer: Self-pay | Admitting: Physician Assistant

## 2020-04-27 ENCOUNTER — Telehealth: Payer: Self-pay

## 2020-04-27 NOTE — Telephone Encounter (Signed)
FYI. Thanks.

## 2020-04-27 NOTE — Telephone Encounter (Signed)
Copied from [place] [phone]. Topic: General - Other >> Apr 26, 2020  4:56 PM Tessa Lerner A wrote: Reason for CRM: Mendel Ryder from [facility] called to notify PCP that patient's wound on the back of the right leg is continuing to drain clear fluid and shows no sign of further healing

## 2020-04-27 NOTE — Telephone Encounter (Signed)
Patient reports that she is doing much better. She reports that [facility] nurse with go see her today. Patient denies any fever, drainage, or pain on leg.

## 2020-04-27 NOTE — Telephone Encounter (Signed)
I thought they had set a f/u appt with our office?  Clear drainage is not a sign of infection and was hoping [facility] RN would work with her on would care for this ulceration.  Consider Unna boot also.

## 2020-04-30 NOTE — Patient Instructions (Signed)
Thank you for allowing the Chronic Care Management team to participate in your care.   Patient Care Plan: Fall Risk (Adult)    Problem Identified: Fall Risk     Long-Range Goal: Absence of Fall and Fall-Related Injury   Start Date: 04/24/2020  Expected End Date: 06/23/2020  Priority: High  Note:   Current Barriers:  . Difficulty Ambulating r/t Spinal Stenosis and gait instability.  Clinical Goal(s):  Marland Kitchen Over the next 120 days, patient will not require hospitalization or emergent care d/t fall related injuries.   Interventions:  . Collaboration with [name], MD regarding development and update of comprehensive plan of care as evidenced by provider attestation and co-signature . Inter-disciplinary care team collaboration (see longitudinal plan of care) . Discussed safety and fall prevention measures. Encouraged to follow plan and restrict activities as recommended by the Home Health Physical Therapy team. . Reviewed medications and discussed potential side effects such as dizziness and frequent urination . Discussed possible in-home needs. Reports being able to perform ADLs but feels she would benefit from a Home Health aide to assist during the week. She is aware that [facility] will only be available while she is receiving home health services. Reports receiving assistance once a week with light housekeeping and small tasks in the home. We will continue to discuss long-term needs if her mobility status does not improve.  Patient Goals/Self-Care Activities Over the next 120 days, patient will:   - Follow mobility plan as outlined by the Home Health PT team. - Utilize assistive devices appropriately with all ambulation - Ensure walking pathways are clear and well lit. - Change positions slowly - Wear secure fitting/skid free footwear when ambulating Over the next 30 days, patient will: - Update the care management team if ability to perform self care declines.  Follow Up Plan:   -Will follow up next month.   Patient Care Plan: Hypertension (Adult)    Problem Identified: Hypertension (Hypertension)     Long-Range Goal: Hypertension Monitored   Start Date: 04/24/2020  Expected End Date: 06/23/2020  Priority: High  Note:   Objective:  . Last practice recorded BP readings:  BP Readings from Last 3 Encounters:  01/16/20 128/68  07/15/19 121/73  03/04/19 132/72 .   Marland Kitchen Most recent eGFR/CrCl: No results found for: EGFR  No components found for: CRCL  Current Barriers:  . Chronic Disease Management support r/t hypertension management.  Case Manager Clinical Goal(s):  Marland Kitchen Over the next 120 days, patient will demonstrate improved adherence to prescribed treatment plan as             evidenced by taking all medications as prescribed, monitoring and recording blood pressure as                       directed and adhering to a low sodium/DASH diet.  Interventions:  . Collaboration with [name], MD regarding development and update of comprehensive p              plan of care as evidenced by provider attestation and co-signature .       Inter-disciplinary care team collaboration (see longitudinal plan of care) . Evaluation of current treatment plan r/t hypertension self management. . Reviewed medications and importance of compliance. Encouraged to continue taking medications as             prescribed and notify provider if unable to tolerate prescribed regimen. Encouraged to update care  management team with concerns regarding prescription cost. . Reviewed home BP readings. Encouraged to continue monitoring and recording readings. Discussed             established BP parameters and indications for notifying provider. Reports systolic readings have                     ranged in the low 130's to 140's. Diastolic readings have ranged in the 80's. . Reviewed s/sx of heart attack and stroke. Discussed worsening s/sx that require immediate medical               attention. . Discussed current home stressors and the impact on her overall health. Reports episodes of stress               related anxiety/depression d/t recent changes in her daughter's health condition. Reports she was                   receiving mild relief with sertraline and aprazolam but now the medications seem less effective. She               plans to discuss other medication options with during her Psychiatry visit.  Patient Goals/Self-Care Activities Over the next 120 days, patient will:  - Self administer medications as prescribed - Attend all scheduled medical appointments - Monitor and record BP readings. - Follow recommended diet. - Call provider office or notify the care management team with new questions/concerns  Follow Up Plan:  Will follow up  next month.   Patient Care Plan: Diabetes Type 2 (Adult)    Problem Identified: Disease Progression (Diabetes, Type 2)     Long-Range Goal: Disease Progression Prevented or Minimized   Start Date: 04/24/2020  Expected End Date: 06/23/2020  Note:   Objective:  Lab Results  Component Value Date   HGBA1C 6.9 (H) 12/29/2019 .   Lab Results  Component Value Date   CREATININE 0.91 12/29/2019   CREATININE 1.03 (H) 09/11/2018   CREATININE 1.00 05/24/2018 .   Marland Kitchen No results found for: EGFR  Current Barriers:  . Chronic disease management and support related to Diabetes self-management   Case Manager Clinical Goal(s):  Over the next 120 days, patient will: Marland Kitchen Demonstrate improved adherence to prescribed treatment plan for Diabetes self management.  Interventions:  . Collaboration with [name], MD regarding development and update of comprehensive plan of care as evidenced by provider attestation and co-signature . Inter-disciplinary care team collaboration (see longitudinal plan of care) . Reviewed medications. Encouraged to take medications as prescribed and notify team with concerns regarding prescription  cost. . Discussed current A1C. Reports not currently monitoring blood glucose levels. Attempting to manage with diet. Discussed nutritional intake and diabetic food options. Encouraged to comply with recommended diet. . Discussed available resources and option for Diabetes education courses. Declines current need for additional resources. . Discussed importance of completing recommended DM preventive care. She is aware of need to schedule a routine eye exam d/t last exam being completed approximately 2 years ago.  . Discussed status of left leg cellulitis along with worsening s/sx that require provider notification. Reports wound has been assessed by the Home Health nurse. Denies fevers. Reports completing antibiotic regimen as prescribed. States the wound is not getting worse but unsure if additional antibiotics are needed. Agreed to notify PCP with changes to the area.   Patient Goals/Self-Care Activities Over the next 120 days, patient will:  - Self administer  oral medications as prescribed - Attend all scheduled provider appointments - Monitor blood glucose levels consistently and utilize recommended interventions - Adhere to recommended diet. - Notify provider or care management team with health related questions and concerns as needed Over the next 30 days patient will: - Continue to monitor left leg and follow recommendations to promote wound healing.   Follow Up Plan:  -Will follow up next month.     Ms. Massar was given information about Chronic Care Management services including:  1. CCM service includes personalized support from designated clinical staff supervised by her physician, including individualized plan of care and coordination with other care providers 2. 24/7 contact phone numbers for assistance for urgent and routine care needs. 3. Service will only be billed when office clinical staff spend 20 minutes or more in a month to coordinate care. 4. Only one practitioner  may furnish and bill the service in a calendar month. 5. The patient may stop CCM services at any time (effective at the end of the month) by phone call to the office staff. 6. The patient will be responsible for cost sharing (co-pay) of up to 20% of the service fee (after annual deductible is met).  Patient agreed to services and verbal consent obtained.     Ms. Garron verbalized understanding of the information discussed during the telephonic outreach today. Declined need for a mailed/printed copy of the information. A member of the care management team will follow-up within the next month.    [name] [phone]

## 2020-04-30 NOTE — Chronic Care Management (AMB) (Signed)
Chronic Care Management   Initial Visit Note   Name: Christina Rubio MRN: [id number] DOB: 1951/02/22  Primary Care Provider: [name], MD Reason for referral : Chronic Care Management  Christina Rubio is a 70 y.o. year old female who is a primary care patient of Bacigalupo, Dionne Bucy, MD. The CCM team was consulted for assistance with chronic disease management and care coordination.  Review of [name]'s status, including review of consultants reports, relevant labs and test results was conducted today. Collaboration with appropriate care team members was performed as part of the comprehensive evaluation and provision of chronic care management services.    SDOH (Social Determinants of Health) assessments performed: Yes See Care Plan activities for detailed interventions related to SDOH  SDOH Interventions   Flowsheet Row Most Recent Value  SDOH Interventions   Food Insecurity Interventions Other (Comment)  [Will update team if assistance is required.]  Stress Interventions Other (Comment)  [Pending initial outreach with Pyschiatrist.]  Transportation Interventions Other (Comment)  [Care Guide currently engaged and assisting with transportation.]  Depression Interventions/Treatment  Currently on Treatment  [Pending  Psychiatry visit on 05/07/20.]       Medications: Outpatient Encounter Medications as of 04/24/2020  Medication Sig Note  . ALPRAZolam (XANAX) 0.5 MG tablet TAKE ONE TABLET BY MOUTH DAILY AS NEEDED FOR ANXIETY   . atorvastatin (LIPITOR) 20 MG tablet Take 1 tablet (20 mg total) by mouth daily.   . famotidine (PEPCID) 20 MG tablet TAKE ONE TABLET BY MOUTH TWICE A DAY 04/24/2020: Reports usually taking once a day.  . levothyroxine (SYNTHROID) 88 MCG tablet Take 1 tablet (88 mcg total) by mouth daily.   Marland Kitchen loperamide (IMODIUM A-D) 2 MG tablet Take 2 mg by mouth 4 (four) times daily as needed for diarrhea or loose stools.   . Probiotic Product (PROBIOTIC PO) Take by  mouth daily.    . sertraline (ZOLOFT) 100 MG tablet TAKE ONE AND ONE-HALF (1 & 1/2) TABLET BY MOUTH DAILY   . valsartan-hydrochlorothiazide (DIOVAN-HCT) 160-25 MG tablet TAKE ONE TABLET BY MOUTH AT BEDTIME   . ketoconazole (NIZORAL) 2 % cream APPLY TOPICALLY ONCE DAILY UNTIL RESOLUTION   . metoprolol succinate (TOPROL-XL) 50 MG 24 hr tablet Take 1 tablet (50 mg total) by mouth daily. Take with or immediately following a meal.   . Multiple Vitamins-Minerals (PRESERVISION AREDS 2) CAPS Take 1 tablet by mouth 2 (two) times daily.   . ondansetron (ZOFRAN ODT) 4 MG disintegrating tablet Take 1 tablet (4 mg total) by mouth every 8 (eight) hours as needed.    No facility-administered encounter medications on file as of 04/24/2020.     Objective:  Patient Care Plan: Fall Risk (Adult)    Problem Identified: Fall Risk     Long-Range Goal: Absence of Fall and Fall-Related Injury   Start Date: 04/24/2020  Expected End Date: 06/23/2020  Priority: High  Note:   Current Barriers:  . Difficulty Ambulating r/t Spinal Stenosis and gait instability.  Clinical Goal(s):  Marland Kitchen Over the next 120 days, patient will not require hospitalization or emergent care d/t fall related injuries.   Interventions:  . Collaboration with [name], MD regarding development and update of comprehensive plan of care as evidenced by provider attestation and co-signature . Inter-disciplinary care team collaboration (see longitudinal plan of care) . Discussed safety and fall prevention measures. Encouraged to follow plan and restrict activities as recommended by the Home Health Physical Therapy team. . Reviewed medications and discussed potential  side effects such as dizziness and frequent urination . Discussed possible in-home needs. Reports being able to perform ADLs but feels she would benefit from a Home Health aide to assist during the week. She is aware that [facility] will only be available while she is receiving home  health services. Reports receiving assistance once a week with light housekeeping and small tasks in the home. We will continue to discuss long-term needs if her mobility status does not improve.  Patient Goals/Self-Care Activities Over the next 120 days, patient will:   - Follow mobility plan as outlined by the Home Health PT team. - Utilize assistive devices appropriately with all ambulation - Ensure walking pathways are clear and well lit. - Change positions slowly - Wear secure fitting/skid free footwear when ambulating Over the next 30 days, patient will: - Update the care management team if ability to perform self care declines.  Follow Up Plan:  -Will follow up next month.   Patient Care Plan: Hypertension (Adult)    Problem Identified: Hypertension (Hypertension)     Long-Range Goal: Hypertension Monitored   Start Date: 04/24/2020  Expected End Date: 06/23/2020  Priority: High  Note:   Objective:  . Last practice recorded BP readings:  BP Readings from Last 3 Encounters:  01/16/20 128/68  07/15/19 121/73  03/04/19 132/72 .   Marland Kitchen Most recent eGFR/CrCl: No results found for: EGFR  No components found for: CRCL  Current Barriers:  . Chronic Disease Management support r/t hypertension management.  Case Manager Clinical Goal(s):  Marland Kitchen Over the next 120 days, patient will demonstrate improved adherence to prescribed treatment plan as             evidenced by taking all medications as prescribed, monitoring and recording blood pressure as                       directed and adhering to a low sodium/DASH diet.  Interventions:  . Collaboration with [name], MD regarding development and update of comprehensive p              plan of care as evidenced by provider attestation and co-signature .       Inter-disciplinary care team collaboration (see longitudinal plan of care) . Evaluation of current treatment plan r/t hypertension self management. . Reviewed medications and  importance of compliance. Encouraged to continue taking medications as             prescribed and notify provider if unable to tolerate prescribed regimen. Encouraged to update care                  management team with concerns regarding prescription cost. . Reviewed home BP readings. Encouraged to continue monitoring and recording readings. Discussed             established BP parameters and indications for notifying provider. Reports systolic readings have                     ranged in the low 130's to 140's. Diastolic readings have ranged in the 80's. . Reviewed s/sx of heart attack and stroke. Discussed worsening s/sx that require immediate medical              attention. . Discussed current home stressors and the impact on her overall health. Reports episodes of stress               related anxiety/depression d/t recent changes  in her daughter's health condition. Reports she was                   receiving mild relief with sertraline and aprazolam but now the medications seem less effective. She               plans to discuss other medication options with during her Psychiatry visit.  Patient Goals/Self-Care Activities Over the next 120 days, patient will:  - Self administer medications as prescribed - Attend all scheduled medical appointments - Monitor and record BP readings. - Follow recommended diet. - Call provider office or notify the care management team with new questions/concerns  Follow Up Plan:  Will follow up  next month.   Patient Care Plan: Diabetes Type 2 (Adult)    Problem Identified: Disease Progression (Diabetes, Type 2)     Long-Range Goal: Disease Progression Prevented or Minimized   Start Date: 04/24/2020  Expected End Date: 06/23/2020  Note:   Objective:  Lab Results  Component Value Date   HGBA1C 6.9 (H) 12/29/2019 .   Lab Results  Component Value Date   CREATININE 0.91 12/29/2019   CREATININE 1.03 (H) 09/11/2018   CREATININE 1.00 05/24/2018 .   Marland Kitchen No results  found for: EGFR  Current Barriers:  . Chronic disease management and support related to Diabetes self-management   Case Manager Clinical Goal(s):  Over the next 120 days, patient will: Marland Kitchen Demonstrate improved adherence to prescribed treatment plan for Diabetes self management.  Interventions:  . Collaboration with Erasmo Downer, MD regarding development and update of comprehensive plan of care as evidenced by provider attestation and co-signature . Inter-disciplinary care team collaboration (see longitudinal plan of care) . Reviewed medications. Encouraged to take medications as prescribed and notify team with concerns regarding prescription cost. . Discussed current A1C. Reports not currently monitoring blood glucose levels. Attempting to manage with diet. Discussed nutritional intake and diabetic food options. Encouraged to comply with recommended diet. . Discussed available resources and option for Diabetes education courses. Declines current need for additional resources. . Discussed importance of completing recommended DM preventive care. She is aware of need to schedule a routine eye exam d/t last exam being completed approximately 2 years ago.  . Discussed status of left leg cellulitis along with worsening s/sx that require provider notification. Reports wound has been assessed by the Home Health nurse. Denies fevers. Reports completing antibiotic regimen as prescribed. States the wound is not getting worse but unsure if additional antibiotics are needed. Agreed to notify PCP with changes to the area.   Patient Goals/Self-Care Activities Over the next 120 days, patient will:  - Self administer oral medications as prescribed - Attend all scheduled provider appointments - Monitor blood glucose levels consistently and utilize recommended interventions - Adhere to recommended diet. - Notify provider or care management team with health related questions and concerns as needed Over  the next 30 days patient will: - Continue to monitor left leg and follow recommendations to promote wound healing.   Follow Up Plan:  -Will follow up next month.     Ms. Ahlquist was given information about Chronic Care Management services including:  1. CCM service includes personalized support from designated clinical staff supervised by her physician, including individualized plan of care and coordination with other care providers 2. 24/7 contact phone numbers for assistance for urgent and routine care needs. 3. Service will only be billed when office clinical staff spend 20 minutes or more in a  month to coordinate care. 4. Only one practitioner may furnish and bill the service in a calendar month. 5. The patient may stop CCM services at any time (effective at the end of the month) by phone call to the office staff. 6. The patient will be responsible for cost sharing (co-pay) of up to 20% of the service fee (after annual deductible is met).  Patient agreed to services and verbal consent obtained.     PLAN A member of the care management team will follow-up within the next month.    [name] Care [facility] [phone]

## 2020-05-01 ENCOUNTER — Ambulatory Visit: Payer: Self-pay | Admitting: *Deleted

## 2020-05-01 DIAGNOSIS — Z9189 Other specified personal risk factors, not elsewhere classified: Secondary | ICD-10-CM

## 2020-05-01 DIAGNOSIS — F329 Major depressive disorder, single episode, unspecified: Secondary | ICD-10-CM

## 2020-05-01 DIAGNOSIS — Z5982 Transportation insecurity: Secondary | ICD-10-CM

## 2020-05-01 DIAGNOSIS — F411 Generalized anxiety disorder: Secondary | ICD-10-CM

## 2020-05-01 NOTE — Chronic Care Management (AMB) (Signed)
Chronic Care Management    Clinical Social Work Note  05/01/2020 Name: Christina Rubio MRN: [id number] DOB: [birth year]  Christina Rubio is a 70 y.o. year old female who is a primary care patient of Bacigalupo, Dionne Bucy, MD. The CCM team was consulted to assist the patient with chronic disease management and/or care coordination needs related to: [employer] .   Engaged with patient by telephone for follow up visit in response to provider referral for social work chronic care management and care coordination services.   Consent to Services:  The patient was given information about Chronic Care Management services, agreed to services, and gave verbal consent prior to initiation of services.  Please see initial visit note for detailed documentation.   Patient agreed to services and consent obtained.   Assessment: Review of patient past medical history, allergies, medications, and health status, including review of relevant consultants reports was performed today as part of a comprehensive evaluation and provision of chronic care management and care coordination services.     SDOH (Social Determinants of Health) assessments and interventions performed:    Advanced Directives Status: Not addressed in this encounter.  CCM Care Plan  Allergies  Allergen Reactions  . Shellfish Allergy     Patient reported.  Marland Kitchen Epinephrine Palpitations    faint  . Penicillins Rash    Has patient had a PCN reaction causing immediate rash, facial/tongue/throat swelling, SOB or lightheadedness with hypotension:  Yes Has patient had a PCN reaction causing severe rash involving mucus membranes or skin necrosis: No Has patient had a PCN reaction that required hospitalization: No Has patient had a PCN reaction occurring within the last 10 years: No If all of the above answers are "NO", then may proceed with Cephalosporin use.;  . Strawberry Extract Rash    Outpatient Encounter Medications as of 05/01/2020   Medication Sig Note  . ALPRAZolam (XANAX) 0.5 MG tablet TAKE ONE TABLET BY MOUTH DAILY AS NEEDED FOR ANXIETY   . atorvastatin (LIPITOR) 20 MG tablet Take 1 tablet (20 mg total) by mouth daily.   . famotidine (PEPCID) 20 MG tablet TAKE ONE TABLET BY MOUTH TWICE A DAY 04/24/2020: Reports usually taking once a day.  . ketoconazole (NIZORAL) 2 % cream APPLY TOPICALLY ONCE DAILY UNTIL RESOLUTION   . levothyroxine (SYNTHROID) 88 MCG tablet Take 1 tablet (88 mcg total) by mouth daily.   Marland Kitchen loperamide (IMODIUM A-D) 2 MG tablet Take 2 mg by mouth 4 (four) times daily as needed for diarrhea or loose stools.   . metoprolol succinate (TOPROL-XL) 50 MG 24 hr tablet Take 1 tablet (50 mg total) by mouth daily. Take with or immediately following a meal.   . Multiple Vitamins-Minerals (PRESERVISION AREDS 2) CAPS Take 1 tablet by mouth 2 (two) times daily.   . ondansetron (ZOFRAN ODT) 4 MG disintegrating tablet Take 1 tablet (4 mg total) by mouth every 8 (eight) hours as needed.   . Probiotic Product (PROBIOTIC PO) Take by mouth daily.    . sertraline (ZOLOFT) 100 MG tablet TAKE ONE AND ONE-HALF (1 & 1/2) TABLET BY MOUTH DAILY   . valsartan-hydrochlorothiazide (DIOVAN-HCT) 160-25 MG tablet TAKE ONE TABLET BY MOUTH AT BEDTIME    No facility-administered encounter medications on file as of 05/01/2020.    Patient Active Problem List   Diagnosis Date Noted  . Bilateral lower extremity edema 01/16/2020  . Candidal intertrigo 04/28/2019  . Esophageal dysphagia 01/13/2019  . Toenail deformity 01/13/2019  . Chest pain 09/22/2018  .  Bradycardia 09/22/2018  . GAD (generalized anxiety disorder) 05/24/2018  . T2DM (type 2 diabetes mellitus) ([place]) 05/24/2018  . Venous stasis 07/28/2017  . Spinal stenosis 05/28/2017  . DJD (degenerative joint disease) 05/28/2017  . Essential hypertension, benign 02/10/2016  . Hyperlipidemia associated with type 2 diabetes mellitus ([place]) 02/10/2016  . MDD (major depressive disorder)  02/10/2016  . Hypothyroidism due to acquired atrophy of thyroid 02/10/2016  . Morbid obesity ([place]) 01/09/2016    Conditions to be addressed/monitored:  Transportation, Mental Health Concerns  and Lacks knowledge of community resource: to assist with grab bars in bathroom and garage to assist with mobility  Care Plan : General Social Work (Adult)  Updates made by Vern Claude, LCSW since 05/01/2020 12:00 AM    Problem: Mobility and Independence     Goal: Mobility and Independence Optimized   Start Date: 04/19/2020  Expected End Date: 09/17/2020  Recent Progress: On track  Priority: High  Note:   Current Barriers:  . Transportation and ADL IADL limitations  Clinical Social Work Clinical Goal(s):  Marland Kitchen Over the next 90 days, patient will work with the community resources identified to address needs related to modifications needed to increase safe mobility in her home  Interventions: . 1:1 collaboration with Brita Romp, Dionne Bucy, MD regarding development and update of comprehensive plan of care as evidenced by provider attestation and co-signature . Inter-disciplinary care team collaboration (see longitudinal plan of care) . Follow up phone call to patient regarding mental health follow up and accessibility needs . Patient continues to discuss difficulty dealing with her medical issues and family conflicts . Per patient, she has not heard from the [employer] yet regarding the grab bars in her garage and bathroom but understands that they will call once the weather improves. . Patient has multiple medical appointments coming up and has contacted [facility] who will need to back in to her garage in order for her to get to the [place] . Patient's next appointment is with [facility] on 05/07/20 at 1:30pm (they have her listed as [name]) . Phone call to [facility] to confirm that they will be able to back in to her garage for [facility]  appointment . Patient will call to confirm that transportation has been arranged . Emotional support and positive reinforcement provided  . Discussed plans with patient for ongoing care management follow up and provided patient with direct contact information for care management team   Patient Goal for self-Care Activities Over the next 90 days, patient will:   - Patient will self administer medications as prescribed Patient will attend all scheduled provider appointments - begin a notebook of services in my neighborhood or community - follow-up on any referrals for help I am given - think ahead to make sure my need does not become an emergency - have a back-up plan  Follow up Plan: SW will follow up with patient by phone over the next 7-14 business days       Follow Up Plan: SW will follow up with patient by phone over the next 14 business days       [place], [place] Worker  [facility] Care Management [phone]

## 2020-05-01 NOTE — Patient Instructions (Addendum)
Visit Information  PATIENT GOALS:  Goals Addressed            This Visit's Progress   . Find Help in My Community       Timeframe:  Long-Range Goal Priority:  High Start Date:  04/19/20                           Expected End Date:    09/17/20                   Follow Up Date 05/15/20   - begin a notebook of services in my neighborhood or community - follow-up on any referrals for help I am given ([facility], [place], [employer]) - think ahead to make sure my need does not become an emergency - have a back-up plan to meet transportation needs   Why is this important?    Knowing how and where to find help for yourself or family in your neighborhood and community is an important skill.   You will want to take some steps to learn how.    Notes:        The patient verbalized understanding of instructions, educational materials, and care plan provided today and declined offer to receive copy of patient instructions, educational materials, and care plan.   Telephone follow up appointment with care management team member scheduled for:05/17/20   Elliot Gurney, [place] Worker  [facility] Care Management [phone]

## 2020-05-03 ENCOUNTER — Telehealth: Payer: Self-pay | Admitting: Family Medicine

## 2020-05-03 ENCOUNTER — Telehealth (INDEPENDENT_AMBULATORY_CARE_PROVIDER_SITE_OTHER): Payer: Medicare Other | Admitting: Physician Assistant

## 2020-05-03 ENCOUNTER — Encounter: Payer: Self-pay | Admitting: Physician Assistant

## 2020-05-03 DIAGNOSIS — B379 Candidiasis, unspecified: Secondary | ICD-10-CM

## 2020-05-03 DIAGNOSIS — T3695XA Adverse effect of unspecified systemic antibiotic, initial encounter: Secondary | ICD-10-CM

## 2020-05-03 DIAGNOSIS — R110 Nausea: Secondary | ICD-10-CM | POA: Diagnosis not present

## 2020-05-03 DIAGNOSIS — L03115 Cellulitis of right lower limb: Secondary | ICD-10-CM

## 2020-05-03 MED ORDER — FLUCONAZOLE 150 MG PO TABS: 150.0000 mg | ORAL_TABLET | Freq: Once | ORAL | 0 refills | Status: AC

## 2020-05-03 MED ORDER — ONDANSETRON 4 MG PO TBDP: 4.0000 mg | ORAL_TABLET | Freq: Three times a day (TID) | ORAL | 0 refills | Status: DC | PRN

## 2020-05-03 MED ORDER — SULFAMETHOXAZOLE-TRIMETHOPRIM 800-160 MG PO TABS: 1.0000 | ORAL_TABLET | Freq: Two times a day (BID) | ORAL | 0 refills | Status: DC

## 2020-05-03 NOTE — Progress Notes (Signed)
MyChart Video Visit    Virtual Visit via Video Note   This visit type was conducted due to national recommendations for restrictions regarding the COVID-19 Pandemic (e.g. social distancing) in an effort to limit this patient's exposure and mitigate transmission in our community. This patient is at least at moderate risk for complications without adequate follow up. This format is felt to be most appropriate for this patient at this time. Physical exam was limited by quality of the video and audio technology used for the visit.   Patient location: Home Provider location: [facility]  I discussed the limitations of evaluation and management by telemedicine and the availability of in person appointments. The patient expressed understanding and agreed to proceed.  Patient: Christina Rubio   DOB: 1950-05-14   70 y.o. Female  MRN: [id number] Visit Date: 05/03/2020  Today's healthcare provider: Mar Daring, PA-C   Chief Complaint  Patient presents with  . Follow-up  . Cellulitis   Subjective    HPI  Follow up for Cellulitis  The patient was last seen for this 3 weeks ago. Changes made at last visit include starting Doxycycline for 7 days.  She reports excellent compliance with treatment. She feels that condition is Unchanged. She is not having side effects.   She does have home health nursing that has been coming and dressing the wound. Now having purulent drainage, soreness and surrounding redness. -----------------------------------------------------------------------------------------    Patient Active Problem List   Diagnosis Date Noted  . Bilateral lower extremity edema 01/16/2020  . Candidal intertrigo 04/28/2019  . Esophageal dysphagia 01/13/2019  . Toenail deformity 01/13/2019  . Chest pain 09/22/2018  . Bradycardia 09/22/2018  . GAD (generalized anxiety disorder) 05/24/2018  . T2DM (type 2 diabetes mellitus) ([place]) 05/24/2018  . Venous  stasis 07/28/2017  . Spinal stenosis 05/28/2017  . DJD (degenerative joint disease) 05/28/2017  . Essential hypertension, benign 02/10/2016  . Hyperlipidemia associated with type 2 diabetes mellitus ([place]) 02/10/2016  . MDD (major depressive disorder) 02/10/2016  . Hypothyroidism due to acquired atrophy of thyroid 02/10/2016  . Morbid obesity ([place]) 01/09/2016   Past Medical History:  Diagnosis Date  . Anxiety   . Arthritis   . Depression   . Hyperlipidemia   . Hypertension   . Neuromuscular disorder ([place])   . Osteoporosis   . PONV (postoperative nausea and vomiting)   . Skin cancer    non melanoma  . Spinal stenosis   . Thyroid disease    hyperthyroid s/p partial thyroidectomy   Social History   Tobacco Use  . Smoking status: Never Smoker  . Smokeless tobacco: Never Used  Vaping Use  . Vaping Use: Never used  Substance Use Topics  . Alcohol use: Yes    Comment: 1-2 drinks per year  . Drug use: No   Allergies  Allergen Reactions  . Shellfish Allergy     Patient reported.  Marland Kitchen Epinephrine Palpitations    faint  . Penicillins Rash    Has patient had a PCN reaction causing immediate rash, facial/tongue/throat swelling, SOB or lightheadedness with hypotension:  Yes Has patient had a PCN reaction causing severe rash involving mucus membranes or skin necrosis: No Has patient had a PCN reaction that required hospitalization: No Has patient had a PCN reaction occurring within the last 10 years: No If all of the above answers are "NO", then may proceed with Cephalosporin use.;  . Strawberry Extract Rash    Medications: Outpatient Medications Prior to  Visit  Medication Sig  . ALPRAZolam (XANAX) 0.5 MG tablet TAKE ONE TABLET BY MOUTH DAILY AS NEEDED FOR ANXIETY  . atorvastatin (LIPITOR) 20 MG tablet Take 1 tablet (20 mg total) by mouth daily.  . famotidine (PEPCID) 20 MG tablet TAKE ONE TABLET BY MOUTH TWICE A DAY  . ketoconazole (NIZORAL) 2 % cream APPLY TOPICALLY ONCE  DAILY UNTIL RESOLUTION  . levothyroxine (SYNTHROID) 88 MCG tablet Take 1 tablet (88 mcg total) by mouth daily.  Marland Kitchen loperamide (IMODIUM A-D) 2 MG tablet Take 2 mg by mouth 4 (four) times daily as needed for diarrhea or loose stools.  . Multiple Vitamins-Minerals (PRESERVISION AREDS 2) CAPS Take 1 tablet by mouth 2 (two) times daily.  . ondansetron (ZOFRAN ODT) 4 MG disintegrating tablet Take 1 tablet (4 mg total) by mouth every 8 (eight) hours as needed.  . Probiotic Product (PROBIOTIC PO) Take by mouth daily.   . sertraline (ZOLOFT) 100 MG tablet TAKE ONE AND ONE-HALF (1 & 1/2) TABLET BY MOUTH DAILY  . valsartan-hydrochlorothiazide (DIOVAN-HCT) 160-25 MG tablet TAKE ONE TABLET BY MOUTH AT BEDTIME  . metoprolol succinate (TOPROL-XL) 50 MG 24 hr tablet Take 1 tablet (50 mg total) by mouth daily. Take with or immediately following a meal.   No facility-administered medications prior to visit.    Review of Systems  Constitutional: Negative.   Respiratory: Negative.   Cardiovascular: Positive for leg swelling. Negative for chest pain and palpitations.  Skin: Positive for wound. Negative for color change, pallor and rash.  Neurological: Negative for dizziness, light-headedness and headaches.      Objective    There were no vitals taken for this visit.   Physical Exam Vitals reviewed.  Constitutional:      Appearance: She is well-developed and well-nourished.  HENT:     Head: Normocephalic and atraumatic.  Eyes:     Extraocular Movements: EOM normal.  Pulmonary:     Effort: Pulmonary effort is normal. No respiratory distress.  Musculoskeletal:     Cervical back: Normal range of motion and neck supple.  Skin:    Comments: Ulceration on the right posteromedial lower extremity with associated redness, purulent drainage and tenderness.  Psychiatric:        Mood and Affect: Mood and affect normal.        Behavior: Behavior normal.        Thought Content: Thought content normal.         Judgment: Judgment normal.        Assessment & Plan     1. Cellulitis of right lower extremity Failed Doxycycline. Will change to bactrim as below. Home health is coming to dress the wound. Call if worsening.  - sulfamethoxazole-trimethoprim (BACTRIM DS) 800-160 MG tablet; Take 1 tablet by mouth 2 (two) times daily.  Dispense: 20 tablet; Refill: 0  2. Nausea Stable. Diagnosis pulled for medication refill. Continue current medical treatment plan. - ondansetron (ZOFRAN ODT) 4 MG disintegrating tablet; Take 1 tablet (4 mg total) by mouth every 8 (eight) hours as needed.  Dispense: 20 tablet; Refill: 0  3. Antibiotic-induced yeast infection Gets yeast infections with antibiotics. Diflucan given as below.  - fluconazole (DIFLUCAN) 150 MG tablet; Take 1 tablet (150 mg total) by mouth once for 1 dose. May repeat in 48-72 hrs if needed  Dispense: 3 tablet; Refill: 0   No follow-ups on file.     I discussed the assessment and treatment plan with the patient. The patient was provided an opportunity to  ask questions and all were answered. The patient agreed with the plan and demonstrated an understanding of the instructions.   The patient was advised to call back or seek an in-person evaluation if the symptoms worsen or if the condition fails to improve as anticipated.  I provided 17 minutes of face-to-face time during this encounter via MyChart Video enabled encounter.  Reynolds Bowl, PA-C, have reviewed all documentation for this visit. The documentation on 05/03/20 for the exam, diagnosis, procedures, and orders are all accurate and complete.  [place] [facility] [phone] (phone) [phone] (fax)  [place]

## 2020-05-03 NOTE — Telephone Encounter (Signed)
Collier Salina PT with [facility] is calling for Verbal Orders. Frequency- 2 w 4,  Order for OT evaluation. Please advise CB- [phone]

## 2020-05-04 NOTE — Telephone Encounter (Signed)
Collier Salina, PT with [facility] calling for verbal orders.   This got sent to [facility] but should have been directed to [facility].  I'm forwarding to the practice.   See attached note.  Thanks.

## 2020-05-04 NOTE — Telephone Encounter (Signed)
Christina Rubio w/ [facility] was advised and states he already spoke with someone from the office about the verbal order.

## 2020-05-04 NOTE — Telephone Encounter (Signed)
Verbal order is fine!

## 2020-05-14 ENCOUNTER — Telehealth: Payer: Self-pay | Admitting: Family Medicine

## 2020-05-14 NOTE — Telephone Encounter (Signed)
Called to request verbal orders for OT - 1xwk1, 2xwk 3,  Any questions, please call [phone]

## 2020-05-15 ENCOUNTER — Telehealth: Payer: Self-pay

## 2020-05-15 NOTE — Telephone Encounter (Signed)
OK for verbals 

## 2020-05-15 NOTE — Telephone Encounter (Signed)
Copied from [place] [phone]. Topic: General - Inquiry >> May 15, 2020 10:11 AM Greggory Keen D wrote: Reason for CRM: Pt called saying she will is switching pharmacy to [facility].  She has been using [facility].

## 2020-05-16 NOTE — Telephone Encounter (Signed)
Pharmacy changed in file

## 2020-05-16 NOTE — Telephone Encounter (Signed)
Advised 

## 2020-05-17 ENCOUNTER — Ambulatory Visit: Payer: Self-pay | Admitting: *Deleted

## 2020-05-17 ENCOUNTER — Ambulatory Visit: Payer: BLUE CROSS/BLUE SHIELD | Admitting: Family Medicine

## 2020-05-17 DIAGNOSIS — E1169 Type 2 diabetes mellitus with other specified complication: Secondary | ICD-10-CM | POA: Diagnosis not present

## 2020-05-17 DIAGNOSIS — F329 Major depressive disorder, single episode, unspecified: Secondary | ICD-10-CM | POA: Diagnosis not present

## 2020-05-17 DIAGNOSIS — F411 Generalized anxiety disorder: Secondary | ICD-10-CM

## 2020-05-17 DIAGNOSIS — Z5982 Transportation insecurity: Secondary | ICD-10-CM

## 2020-05-17 DIAGNOSIS — Z9189 Other specified personal risk factors, not elsewhere classified: Secondary | ICD-10-CM

## 2020-05-17 DIAGNOSIS — I10 Essential (primary) hypertension: Secondary | ICD-10-CM | POA: Diagnosis not present

## 2020-05-17 NOTE — Patient Instructions (Signed)
Visit Information  Goals Addressed            This Visit's Progress   . Find Help in My Community       Timeframe:  Long-Range Goal Priority:  High Start Date:  04/19/20                           Expected End Date:    09/17/20                   Follow Up Date 05/15/20   - begin a notebook of services in my neighborhood or community - continue to follow-up on any referrals for help I am given ([facility], [place], [employer]) - think ahead to make sure my need does not become an emergency - have a back-up plan to meet transportation needs   Why is this important?    Knowing how and where to find help for yourself or family in your neighborhood and community is an important skill.   You will want to take some steps to learn how.    Notes:        The patient verbalized understanding of instructions, educational materials, and care plan provided today and declined offer to receive copy of patient instructions, educational materials, and care plan.   Telephone follow up appointment with care management team member scheduled for:05/31/20   Elliot Gurney, [place] Worker  [facility] Care Management [phone]

## 2020-05-17 NOTE — Chronic Care Management (AMB) (Signed)
Chronic Care Management    Clinical Social Work Note  05/17/2020 Name: Christina Rubio MRN: [id number] DOB: 01-Oct-1950  Christina Rubio is a 70 y.o. year old female who is a primary care patient of Bacigalupo, Christina Bucy, MD. The CCM team was consulted to assist the patient with chronic disease management and/or care coordination needs related to: [employer] .   Engaged with patient by telephone for follow up visit in response to provider referral for social work chronic care management and care coordination services.   Consent to Services:  The patient was given information about Chronic Care Management services, agreed to services, and gave verbal consent prior to initiation of services.  Please see initial visit note for detailed documentation.   Patient agreed to services and consent obtained.   Assessment: Review of patient past medical history, allergies, medications, and health status, including review of relevant consultants reports was performed today as part of a comprehensive evaluation and provision of chronic care management and care coordination services.     SDOH (Social Determinants of Health) assessments and interventions performed:    Advanced Directives Status: Not addressed in this encounter.  CCM Care Plan  Allergies  Allergen Reactions  . Shellfish Allergy     Patient reported.  Marland Kitchen Epinephrine Palpitations    faint  . Penicillins Rash    Has patient had a PCN reaction causing immediate rash, facial/tongue/throat swelling, SOB or lightheadedness with hypotension:  Yes Has patient had a PCN reaction causing severe rash involving mucus membranes or skin necrosis: No Has patient had a PCN reaction that required hospitalization: No Has patient had a PCN reaction occurring within the last 10 years: No If all of the above answers are "NO", then may proceed with Cephalosporin use.;  . Strawberry Extract Rash    Outpatient Encounter Medications as of 05/17/2020   Medication Sig Note  . ALPRAZolam (XANAX) 0.5 MG tablet TAKE ONE TABLET BY MOUTH DAILY AS NEEDED FOR ANXIETY   . atorvastatin (LIPITOR) 20 MG tablet Take 1 tablet (20 mg total) by mouth daily.   . famotidine (PEPCID) 20 MG tablet TAKE ONE TABLET BY MOUTH TWICE A DAY 04/24/2020: Reports usually taking once a day.  . levothyroxine (SYNTHROID) 88 MCG tablet Take 1 tablet (88 mcg total) by mouth daily.   Marland Kitchen loperamide (IMODIUM A-D) 2 MG tablet Take 2 mg by mouth 4 (four) times daily as needed for diarrhea or loose stools.   . metoprolol succinate (TOPROL-XL) 50 MG 24 hr tablet Take 1 tablet (50 mg total) by mouth daily. Take with or immediately following a meal.   . Multiple Vitamins-Minerals (PRESERVISION AREDS 2) CAPS Take 1 tablet by mouth 2 (two) times daily.   . ondansetron (ZOFRAN ODT) 4 MG disintegrating tablet Take 1 tablet (4 mg total) by mouth every 8 (eight) hours as needed.   . Probiotic Product (PROBIOTIC PO) Take by mouth daily.    . sertraline (ZOLOFT) 100 MG tablet TAKE ONE AND ONE-HALF (1 & 1/2) TABLET BY MOUTH DAILY   . sulfamethoxazole-trimethoprim (BACTRIM DS) 800-160 MG tablet Take 1 tablet by mouth 2 (two) times daily.   . valsartan-hydrochlorothiazide (DIOVAN-HCT) 160-25 MG tablet TAKE ONE TABLET BY MOUTH AT BEDTIME    No facility-administered encounter medications on file as of 05/17/2020.    Patient Active Problem List   Diagnosis Date Noted  . Bilateral lower extremity edema 01/16/2020  . Candidal intertrigo 04/28/2019  . Esophageal dysphagia 01/13/2019  . Toenail deformity 01/13/2019  .  Chest pain 09/22/2018  . Bradycardia 09/22/2018  . GAD (generalized anxiety disorder) 05/24/2018  . T2DM (type 2 diabetes mellitus) ([place]) 05/24/2018  . Venous stasis 07/28/2017  . Spinal stenosis 05/28/2017  . DJD (degenerative joint disease) 05/28/2017  . Essential hypertension, benign 02/10/2016  . Hyperlipidemia associated with type 2 diabetes mellitus ([place]) 02/10/2016  . MDD  (major depressive disorder) 02/10/2016  . Hypothyroidism due to acquired atrophy of thyroid 02/10/2016  . Morbid obesity ([place]) 01/09/2016    Conditions to be addressed/monitored:  Transportation and Lacks knowledge of community resource: related to DME  Care Plan : General Social Work (Adult)  Updates made by Vern Claude, LCSW since 05/17/2020 12:00 AM    Problem: Mobility and Independence     Goal: Mobility and Independence Optimized   Start Date: 04/19/2020  Expected End Date: 09/17/2020  Recent Progress: On track  Priority: High  Note:   Current Barriers:  . Transportation and ADL IADL limitations  Clinical Social Work Clinical Goal(s):  Marland Kitchen Over the next 90 days, patient will work with the community resources identified to address needs related to modifications needed to increase safe mobility in her home  Interventions: . 1:1 collaboration with Christina Rubio, Christina Bucy, MD regarding development and update of comprehensive plan of care as evidenced by provider attestation and co-signature . Inter-disciplinary care team collaboration (see longitudinal plan of care) . Follow up phone call to patient regarding mental health follow up and accessibility needs . Patient continues to discuss difficulty dealing with her medical issues and family conflicts . Per patient, she has not heard from the [employer] yet regarding the grab bars in her garage and bathroom but understands that they will call once the weather improves. . Collaboration phone call to [facility] for status of grab bars, voicemail message left requesting a return call. . Patient's appointment is with [facility] on 05/07/20 at 1:30pm was missed-patient confirms that she has re-scheduled this appointment and will have to re-schedule transport(they have her listed as [name]) . Emotional support and reassurance provided to patient . Positive coping /self care  emphasized . Discussed plans with patient for ongoing care management follow up and provided patient with direct contact information for care management team   Patient Goal for self-Care Activities Over the next 90 days, patient will:   - Patient will self administer medications as prescribed Patient will attend all scheduled provider appointments - begin a notebook of services in my neighborhood or community - follow-up on any referrals for help I am given - think ahead to make sure my need does not become an emergency - have a back-up plan  Follow up Plan: SW will follow up with patient by phone over the next 7-14 business days       Follow Up Plan: SW will follow up with patient by phone over the next 14 business days       [place], [place] Worker  [facility] Care Management [phone]

## 2020-05-18 ENCOUNTER — Ambulatory Visit: Payer: Medicare Other | Admitting: Podiatry

## 2020-05-18 DIAGNOSIS — E039 Hypothyroidism, unspecified: Secondary | ICD-10-CM | POA: Diagnosis not present

## 2020-05-18 DIAGNOSIS — E7800 Pure hypercholesterolemia, unspecified: Secondary | ICD-10-CM | POA: Diagnosis not present

## 2020-05-18 DIAGNOSIS — E785 Hyperlipidemia, unspecified: Secondary | ICD-10-CM | POA: Diagnosis not present

## 2020-05-18 DIAGNOSIS — I10 Essential (primary) hypertension: Secondary | ICD-10-CM | POA: Diagnosis not present

## 2020-05-18 DIAGNOSIS — M4800 Spinal stenosis, site unspecified: Secondary | ICD-10-CM | POA: Diagnosis not present

## 2020-05-18 DIAGNOSIS — M1990 Unspecified osteoarthritis, unspecified site: Secondary | ICD-10-CM | POA: Diagnosis not present

## 2020-05-18 DIAGNOSIS — F329 Major depressive disorder, single episode, unspecified: Secondary | ICD-10-CM | POA: Diagnosis not present

## 2020-05-18 DIAGNOSIS — F411 Generalized anxiety disorder: Secondary | ICD-10-CM | POA: Diagnosis not present

## 2020-05-18 DIAGNOSIS — I872 Venous insufficiency (chronic) (peripheral): Secondary | ICD-10-CM | POA: Diagnosis not present

## 2020-05-18 DIAGNOSIS — E1151 Type 2 diabetes mellitus with diabetic peripheral angiopathy without gangrene: Secondary | ICD-10-CM | POA: Diagnosis not present

## 2020-05-18 DIAGNOSIS — R1319 Other dysphagia: Secondary | ICD-10-CM | POA: Diagnosis not present

## 2020-05-18 DIAGNOSIS — L03115 Cellulitis of right lower limb: Secondary | ICD-10-CM | POA: Diagnosis not present

## 2020-05-18 DIAGNOSIS — B372 Candidiasis of skin and nail: Secondary | ICD-10-CM | POA: Diagnosis not present

## 2020-05-18 DIAGNOSIS — R600 Localized edema: Secondary | ICD-10-CM | POA: Diagnosis not present

## 2020-05-18 DIAGNOSIS — E1169 Type 2 diabetes mellitus with other specified complication: Secondary | ICD-10-CM | POA: Diagnosis not present

## 2020-05-22 ENCOUNTER — Other Ambulatory Visit: Payer: Self-pay

## 2020-05-22 ENCOUNTER — Encounter: Payer: Self-pay | Admitting: Family Medicine

## 2020-05-22 ENCOUNTER — Ambulatory Visit (INDEPENDENT_AMBULATORY_CARE_PROVIDER_SITE_OTHER): Payer: Medicare Other | Admitting: Family Medicine

## 2020-05-22 ENCOUNTER — Telehealth: Payer: Self-pay

## 2020-05-22 VITALS — BP 147/73 | HR 63 | Temp 98.4°F

## 2020-05-22 DIAGNOSIS — E1169 Type 2 diabetes mellitus with other specified complication: Secondary | ICD-10-CM | POA: Diagnosis not present

## 2020-05-22 DIAGNOSIS — I890 Lymphedema, not elsewhere classified: Secondary | ICD-10-CM

## 2020-05-22 DIAGNOSIS — L97211 Non-pressure chronic ulcer of right calf limited to breakdown of skin: Secondary | ICD-10-CM

## 2020-05-22 DIAGNOSIS — E1159 Type 2 diabetes mellitus with other circulatory complications: Secondary | ICD-10-CM

## 2020-05-22 DIAGNOSIS — I152 Hypertension secondary to endocrine disorders: Secondary | ICD-10-CM

## 2020-05-22 DIAGNOSIS — E034 Atrophy of thyroid (acquired): Secondary | ICD-10-CM | POA: Diagnosis not present

## 2020-05-22 DIAGNOSIS — I83012 Varicose veins of right lower extremity with ulcer of calf: Secondary | ICD-10-CM

## 2020-05-22 DIAGNOSIS — F331 Major depressive disorder, recurrent, moderate: Secondary | ICD-10-CM

## 2020-05-22 DIAGNOSIS — E785 Hyperlipidemia, unspecified: Secondary | ICD-10-CM

## 2020-05-22 DIAGNOSIS — I872 Venous insufficiency (chronic) (peripheral): Secondary | ICD-10-CM

## 2020-05-22 NOTE — Telephone Encounter (Signed)
  Chronic Care Management   Outreach Note  05/22/2020 Name: Christina Rubio MRN: [id number] DOB: 1951-03-10  Primary Care Provider: [name], MD Reason for referral : Chronic Care Management   An unsuccessful telephone outreach was attempted today. Christina Rubio is currently enrolled in the Chronic Care Management program.   Follow Up Plan:  Received voice prompt. Unable to leave a voice message today. A member of the care management team will attempt to reach Christina Rubio again within the next week.    [name] [phone]

## 2020-05-22 NOTE — Progress Notes (Signed)
Acute Office Visit  Subjective:    Patient ID: Christina Rubio, female    DOB: 1950-05-22, 70 y.o.   MRN: [id number]  Chief Complaint  Patient presents with  . Leg Swelling    HPI Patient is in today for leg pain.  Patient states that about 1 month ago she had a spot on the right calf that was dry and scaly.  She was using a loofa on the area to see if it would help.  She said the area began getting worse so she stopped.  She had a virtual visit here and was given antibiotics.  She states they did not help and she did not see any improvement.  Patient has been getting PT. OT and home health nurse visits for another issue.  The nurse has doing dressing changes to the area 3-4 times per week.  Her last time out was 5 days ago.    Past Medical History:  Diagnosis Date  . Anxiety   . Arthritis   . Depression   . Hyperlipidemia   . Hypertension   . Neuromuscular disorder ([place])   . Osteoporosis   . PONV (postoperative nausea and vomiting)   . Skin cancer    non melanoma  . Spinal stenosis   . Thyroid disease    hyperthyroid s/p partial thyroidectomy    Past Surgical History:  Procedure Laterality Date  . CESAREAN SECTION    . CHOLECYSTECTOMY    . COLON SURGERY    . FEMUR IM NAIL Right 01/09/2016   Procedure: INTRAMEDULLARY (IM) RETROGRADE FEMORAL NAILING RIGHT DISTAL FEMUR;  Surgeon: Dorna Leitz, MD;  Location: [facility];  Service: Orthopedics;  Laterality: Right;  . LAPAROSCOPIC GASTRIC SLEEVE RESECTION    . Right thyroidectomy    . TONSILLECTOMY      Family History  Problem Relation Age of Onset  . Lung cancer Mother   . Prostate cancer Father   . Depression Daughter   . Anemia Daughter        [facility]  . Thyroid cancer Maternal Uncle   . Lung cancer Paternal Uncle   . Stroke Maternal Grandmother   . Stroke Maternal Grandfather   . Stroke Paternal Grandmother   . Healthy Sister   . Healthy Brother   . Healthy Sister   . Emphysema Paternal Grandfather   . Colon cancer  Neg Hx   . Breast cancer Neg Hx   . Ovarian cancer Neg Hx   . Cervical cancer Neg Hx     Social History   Socioeconomic History  . Marital status: Widowed    Spouse name: Not on file  . Number of children: 1  . Years of education: 2 master's degrees  . Highest education level: Master's degree (e.g., MA, MS, MEng, MEd, MSW, MBA)  Occupational History  . Occupation: retired    Comment: Proofreader  Tobacco Use  . Smoking status: Never Smoker  . Smokeless tobacco: Never Used  Vaping Use  . Vaping Use: Never used  Substance and Sexual Activity  . Alcohol use: Yes    Comment: 1-2 drinks per year  . Drug use: No  . Sexual activity: Never  Other Topics Concern  . Not on file  Social History Narrative  . Not on file   Social Determinants of Health   Financial Resource Strain: Low Risk   . Difficulty of Paying Living Expenses: Not hard at all  Food Insecurity: No Food Insecurity  . Worried About Estate manager/land agent  of Food in the Last Year: Never true  . Ran Out of Food in the Last Year: Never true  Transportation Needs: Unmet Transportation Needs  . Lack of Transportation (Medical): Yes  . Lack of Transportation (Non-Medical): Yes  Physical Activity: Inactive  . Days of Exercise per Week: 0 days  . Minutes of Exercise per Session: 0 min  Stress: Stress Concern Present  . Feeling of Stress : Very much  Social Connections: Socially Isolated  . Frequency of Communication with Friends and Family: More than three times a week  . Frequency of Social Gatherings with Friends and Family: More than three times a week  . Attends Religious Services: Never  . Active Member of Clubs or Organizations: No  . Attends Archivist Meetings: Never  . Marital Status: Widowed  Intimate Partner Violence: Not At Risk  . Fear of Current or Ex-Partner: No  . Emotionally Abused: No  . Physically Abused: No  . Sexually Abused: No    Outpatient Medications Prior to Visit  Medication  Sig Dispense Refill  . ALPRAZolam (XANAX) 0.5 MG tablet TAKE ONE TABLET BY MOUTH DAILY AS NEEDED FOR ANXIETY 30 tablet 0  . atorvastatin (LIPITOR) 20 MG tablet Take 1 tablet (20 mg total) by mouth daily. 90 tablet 3  . famotidine (PEPCID) 20 MG tablet TAKE ONE TABLET BY MOUTH TWICE A DAY 60 tablet 3  . levothyroxine (SYNTHROID) 88 MCG tablet Take 1 tablet (88 mcg total) by mouth daily. 90 tablet 3  . loperamide (IMODIUM A-D) 2 MG tablet Take 2 mg by mouth 4 (four) times daily as needed for diarrhea or loose stools.    . Multiple Vitamins-Minerals (PRESERVISION AREDS 2) CAPS Take 1 tablet by mouth 2 (two) times daily.    . ondansetron (ZOFRAN ODT) 4 MG disintegrating tablet Take 1 tablet (4 mg total) by mouth every 8 (eight) hours as needed. 20 tablet 0  . Probiotic Product (PROBIOTIC PO) Take by mouth daily.     . sertraline (ZOLOFT) 100 MG tablet TAKE ONE AND ONE-HALF (1 & 1/2) TABLET BY MOUTH DAILY 135 tablet 2  . sulfamethoxazole-trimethoprim (BACTRIM DS) 800-160 MG tablet Take 1 tablet by mouth 2 (two) times daily. 20 tablet 0  . valsartan-hydrochlorothiazide (DIOVAN-HCT) 160-25 MG tablet TAKE ONE TABLET BY MOUTH AT BEDTIME 90 tablet 1  . metoprolol succinate (TOPROL-XL) 50 MG 24 hr tablet Take 1 tablet (50 mg total) by mouth daily. Take with or immediately following a meal. 90 tablet 3   No facility-administered medications prior to visit.    Allergies  Allergen Reactions  . Shellfish Allergy     Patient reported.  Marland Kitchen Epinephrine Palpitations    faint  . Penicillins Rash    Has patient had a PCN reaction causing immediate rash, facial/tongue/throat swelling, SOB or lightheadedness with hypotension:  Yes Has patient had a PCN reaction causing severe rash involving mucus membranes or skin necrosis: No Has patient had a PCN reaction that required hospitalization: No Has patient had a PCN reaction occurring within the last 10 years: No If all of the above answers are "NO", then may  proceed with Cephalosporin use.;  . Strawberry Extract Rash    Review of Systems  Cardiovascular: Positive for leg swelling.  Musculoskeletal: Positive for gait problem.  Skin: Positive for wound.       Objective:    Physical Exam Constitutional:      General: She is not in acute distress.    Appearance: Normal  appearance.     Comments: Sitting in wheelchair  HENT:     Head: Normocephalic.  Eyes:     General: No scleral icterus.    Conjunctiva/sclera: Conjunctivae normal.  Cardiovascular:     Rate and Rhythm: Normal rate and regular rhythm.     Pulses: Normal pulses.     Heart sounds: Normal heart sounds. No murmur heard.   Pulmonary:     Effort: Pulmonary effort is normal. No respiratory distress.     Breath sounds: Normal breath sounds. No wheezing.  Abdominal:     General: There is no distension.     Palpations: Abdomen is soft.     Tenderness: There is no abdominal tenderness.  Musculoskeletal:     Cervical back: Neck supple.     Right lower leg: Edema present.     Left lower leg: Edema present.  Lymphadenopathy:     Cervical: No cervical adenopathy.  Skin:    Comments: Ulceration on RLE (back of calf) that has no erythema or purulent drainage and evidence of granulation tissue  Neurological:     Mental Status: She is alert.     BP (!) 147/73 (BP Location: Left Wrist, Patient Position: Sitting, Cuff Size: Large)   Pulse 63   Temp 98.4 F (36.9 C) (Oral)   SpO2 100%  Wt Readings from Last 3 Encounters:  07/15/19 (!) 403 lb (182.8 kg)  03/04/19 (!) 387 lb (175.5 kg)  01/13/19 (!) 387 lb (175.5 kg)   Vitals:   05/22/20 1612 05/22/20 1646  BP: (!) 144/55 (!) 147/73  Pulse: 63   Temp: 98.4 F (36.9 C)   TempSrc: Oral   SpO2: 100%      Health Maintenance Due  Topic Date Due  . FOOT EXAM  Never done  . OPHTHALMOLOGY EXAM  Never done  . COLONOSCOPY (Pts 45-36yrs Insurance coverage will need to be confirmed)  Never done  . MAMMOGRAM  Never done   . DEXA SCAN  Never done  . COVID-19 Vaccine (3 - Booster for Pfizer series) 12/15/2019    There are no preventive care reminders to display for this patient.   Lab Results  Component Value Date   TSH 6.200 (H) 05/22/2020   Lab Results  Component Value Date   WBC 9.8 09/11/2018   HGB 13.3 09/11/2018   HCT 41.1 09/11/2018   MCV 86.7 09/11/2018   PLT 263 09/11/2018   Lab Results  Component Value Date   NA 140 05/22/2020   K 4.1 05/22/2020   CO2 22 05/22/2020   GLUCOSE 131 (H) 05/22/2020   BUN 16 05/22/2020   CREATININE 1.19 (H) 05/22/2020   BILITOT 0.5 05/22/2020   ALKPHOS 42 (L) 05/22/2020   AST 16 05/22/2020   ALT 15 05/22/2020   PROT 7.3 05/22/2020   ALBUMIN 4.3 05/22/2020   CALCIUM 9.4 05/22/2020   ANIONGAP 12 09/11/2018   Lab Results  Component Value Date   CHOL 144 05/22/2020   Lab Results  Component Value Date   HDL 38 (L) 05/22/2020   Lab Results  Component Value Date   LDLCALC 82 05/22/2020   Lab Results  Component Value Date   TRIG 134 05/22/2020   Lab Results  Component Value Date   CHOLHDL 3.8 05/22/2020   Lab Results  Component Value Date   HGBA1C 7.0 (H) 05/22/2020       Assessment & Plan:   Problem List Items Addressed This Visit      Cardiovascular and  Mediastinum   Hypertension associated with diabetes ([place])    Uncontrolled today with high BP, but patient would like to monitor home BPs before adjusting meds At next visit, consider medication changes Recheck metabolic panel      Relevant Orders   Comprehensive metabolic panel (Completed)     Endocrine   Hyperlipidemia associated with type 2 diabetes mellitus (H[facility])    Recent atorvastatin dose increase after diagnosis of DM with goal LDL <70 Continue statin Repeat FLP and CMP      Relevant Orders   Comprehensive metabolic panel (Completed)   Lipid panel (Completed)   Hypothyroidism due to acquired atrophy of thyroid    Previously uncontrolled Continue Synthroid at  current dose  Recheck TSH and adjust Synthroid as indicated        Relevant Orders   TSH (Completed)   T2DM (type 2 diabetes mellitus) ([place]) - Primary    Recent diagnosis On no meds On ARB and statin Recheck A1c      Relevant Orders   Hemoglobin A1c (Completed)     Musculoskeletal and Integument   Venous stasis ulcer of right calf limited to breakdown of skin ([place])    Improving with no further infection signs Continue HH RN wound care Needs to see VVS given lymphedema and chronic venous stasis May benefit from lymphedema pumps      Relevant Orders   Ambulatory referral to Vascular Surgery     Other   Morbid obesity ([place]) (Chronic)    Discussed importance of healthy weight management Discussed diet and exercise       MDD (major depressive disorder)    Chronic and stable Continue Zoloft at current dose      Lymphedema    VVS referral as above May benefit from lymphedema pumps      Relevant Orders   Ambulatory referral to Vascular Surgery    Other Visit Diagnoses    Venous stasis dermatitis of both lower extremities       Relevant Orders   Ambulatory referral to Vascular Surgery       Return in about 3 months (around 08/22/2020) for chronic disease f/u.   I, Lavon Paganini, MD, have reviewed all documentation for this visit. The documentation on 05/23/20 for the exam, diagnosis, procedures, and orders are all accurate and complete.   Cambre Matson, Dionne Bucy, MD, MPH [facility]

## 2020-05-23 DIAGNOSIS — L97211 Non-pressure chronic ulcer of right calf limited to breakdown of skin: Secondary | ICD-10-CM | POA: Insufficient documentation

## 2020-05-23 DIAGNOSIS — I83012 Varicose veins of right lower extremity with ulcer of calf: Secondary | ICD-10-CM | POA: Insufficient documentation

## 2020-05-23 DIAGNOSIS — I890 Lymphedema, not elsewhere classified: Secondary | ICD-10-CM | POA: Insufficient documentation

## 2020-05-23 LAB — COMPREHENSIVE METABOLIC PANEL
ALT: 15 IU/L (ref 0–32)
AST: 16 IU/L (ref 0–40)
Albumin/Globulin Ratio: 1.4 (ref 1.2–2.2)
Albumin: 4.3 g/dL (ref 3.8–4.8)
Alkaline Phosphatase: 42 IU/L — ABNORMAL LOW (ref 44–121)
BUN/Creatinine Ratio: 13 (ref 12–28)
BUN: 16 mg/dL (ref 8–27)
Bilirubin Total: 0.5 mg/dL (ref 0.0–1.2)
CO2: 22 mmol/L (ref 20–29)
Calcium: 9.4 mg/dL (ref 8.7–10.3)
Chloride: 101 mmol/L (ref 96–106)
Creatinine, Ser: 1.19 mg/dL — ABNORMAL HIGH (ref 0.57–1.00)
Globulin, Total: 3 g/dL (ref 1.5–4.5)
Glucose: 131 mg/dL — ABNORMAL HIGH (ref 65–99)
Potassium: 4.1 mmol/L (ref 3.5–5.2)
Sodium: 140 mmol/L (ref 134–144)
Total Protein: 7.3 g/dL (ref 6.0–8.5)
eGFR: 50 mL/min/{1.73_m2} — ABNORMAL LOW (ref >59)

## 2020-05-23 LAB — LIPID PANEL
Chol/HDL Ratio: 3.8 ratio (ref 0.0–4.4)
Cholesterol, Total: 144 mg/dL (ref 100–199)
HDL: 38 mg/dL — ABNORMAL LOW (ref >39)
LDL Chol Calc (NIH): 82 mg/dL (ref 0–99)
Triglycerides: 134 mg/dL (ref 0–149)
VLDL Cholesterol Cal: 24 mg/dL (ref 5–40)

## 2020-05-23 LAB — TSH: TSH: 6.2 u[IU]/mL — ABNORMAL HIGH (ref 0.450–4.500)

## 2020-05-23 LAB — HEMOGLOBIN A1C
Est. average glucose Bld gHb Est-mCnc: 154 mg/dL
Hgb A1c MFr Bld: 7 % — ABNORMAL HIGH (ref 4.8–5.6)

## 2020-05-23 NOTE — Assessment & Plan Note (Signed)
VVS referral as above May benefit from lymphedema pumps

## 2020-05-23 NOTE — Assessment & Plan Note (Signed)
Previously uncontrolled Continue Synthroid at current dose  Recheck TSH and adjust Synthroid as indicated   

## 2020-05-23 NOTE — Assessment & Plan Note (Signed)
Discussed importance of healthy weight management Discussed diet and exercise  

## 2020-05-23 NOTE — Assessment & Plan Note (Signed)
Improving with no further infection signs Continue HH RN wound care Needs to see VVS given lymphedema and chronic venous stasis May benefit from lymphedema pumps

## 2020-05-23 NOTE — Assessment & Plan Note (Signed)
Uncontrolled today with high BP, but patient would like to monitor home BPs before adjusting meds At next visit, consider medication changes Recheck metabolic panel

## 2020-05-23 NOTE — Assessment & Plan Note (Signed)
Recent atorvastatin dose increase after diagnosis of DM with goal LDL <70 Continue statin Repeat FLP and CMP

## 2020-05-23 NOTE — Assessment & Plan Note (Signed)
Chronic and stable Continue Zoloft at current dose

## 2020-05-23 NOTE — Assessment & Plan Note (Signed)
Recent diagnosis On no meds On ARB and statin Recheck A1c

## 2020-05-24 ENCOUNTER — Ambulatory Visit: Payer: Self-pay | Admitting: *Deleted

## 2020-05-24 DIAGNOSIS — E034 Atrophy of thyroid (acquired): Secondary | ICD-10-CM

## 2020-05-24 DIAGNOSIS — E1169 Type 2 diabetes mellitus with other specified complication: Secondary | ICD-10-CM

## 2020-05-24 MED ORDER — LEVOTHYROXINE SODIUM 100 MCG PO TABS: 100.0000 ug | ORAL_TABLET | Freq: Every day | ORAL | 1 refills | Status: DC

## 2020-05-24 MED ORDER — METFORMIN HCL 500 MG PO TABS: 500.0000 mg | ORAL_TABLET | Freq: Two times a day (BID) | ORAL | 3 refills | Status: DC

## 2020-05-24 MED ORDER — BLOOD GLUCOSE METER KIT: PACK | 0 refills | Status: DC

## 2020-05-24 NOTE — Telephone Encounter (Signed)
Pt called in and was given her lab result message from Dr. Brita Romp dated 05/23/2020 at 5:00 PM.  She is ok with starting the Metformin. She verbalized understanding to start the new rx for 100 mcg of her thyroid medication.  She is requesting a new glucose meter that her insurance will cover.  Her old one has finally stopped working.  She would like her rx sent to [facility].  I sent this information to [facility] for Dr. Brita Romp.

## 2020-05-24 NOTE — Telephone Encounter (Signed)
-----   Message from [name], MD sent at 05/23/2020  5:00 PM EST ----- A1c has increased to 7. Goal is less than 7. Recommend starting metformin 500mg  twice daily (CMAs ok to send Rx if patient agrees).   Thyroid level is still high.  Recommend increasing levothyroxine to 163mcg daily (ok to send new Rx). Cholesterol is improving.

## 2020-05-25 ENCOUNTER — Ambulatory Visit: Payer: BLUE CROSS/BLUE SHIELD | Admitting: Podiatry

## 2020-05-29 ENCOUNTER — Other Ambulatory Visit: Payer: Self-pay

## 2020-05-29 ENCOUNTER — Ambulatory Visit: Payer: BLUE CROSS/BLUE SHIELD | Admitting: Podiatry

## 2020-05-29 DIAGNOSIS — M79675 Pain in left toe(s): Secondary | ICD-10-CM

## 2020-05-29 DIAGNOSIS — M79674 Pain in right toe(s): Secondary | ICD-10-CM | POA: Diagnosis not present

## 2020-05-29 DIAGNOSIS — B351 Tinea unguium: Secondary | ICD-10-CM

## 2020-05-29 DIAGNOSIS — I890 Lymphedema, not elsewhere classified: Secondary | ICD-10-CM

## 2020-05-29 NOTE — Progress Notes (Signed)
   SUBJECTIVE Patient presents to office today in a wheelchair complaining of elongated, thickened nails that cause pain while ambulating in shoes.  Patient only ambulates minimally.  She suffers from chronic lower extremity lymphedema.  She is unable to trim her own nails. Patient is here for further evaluation and treatment.  Past Medical History:  Diagnosis Date  . Anxiety   . Arthritis   . Depression   . Hyperlipidemia   . Hypertension   . Neuromuscular disorder ([place])   . Osteoporosis   . PONV (postoperative nausea and vomiting)   . Skin cancer    non melanoma  . Spinal stenosis   . Thyroid disease    hyperthyroid s/p partial thyroidectomy    OBJECTIVE General Patient is awake, alert, and oriented x 3 and in no acute distress. Derm Skin is dry and supple bilateral. Negative open lesions or macerations. Remaining integument unremarkable. Nails are tender, long, thickened and dystrophic with subungual debris, consistent with onychomycosis, 1-5 bilateral. No signs of infection noted.  Superficial breakdown with maceration noted to the posterior calf RLE. Vasc chronic lymphedema noted with superficial skin breakdown of the posterior aspect of the right leg/calf Neuro Epicritic and protective threshold sensation grossly intact bilaterally.  Musculoskeletal Exam No symptomatic pedal deformities noted bilateral. Muscular strength within normal limits.  ASSESSMENT 1. Onychodystrophic nails 1-5 bilateral with hyperkeratosis of nails.  2.  Chronic bilateral lower extremity lymphedema 3. Pain in foot bilateral  PLAN OF CARE 1. Patient evaluated today.  2. Instructed to maintain good pedal hygiene and foot care.  3. Mechanical debridement of nails 1-5 bilaterally performed using a nail nipper. Filed with dremel without incident.  4.  Patient has an appointment with vascular next week to address her lymphedema.  Dry sterile dressings were applied today.   5.  Return to clinic as needed  for routine foot care   Edrick Kins, DPM [facility]  Dr. Edrick Kins, DPM    [address],  [address]                Office [phone]  Fax [phone]

## 2020-05-31 ENCOUNTER — Ambulatory Visit (INDEPENDENT_AMBULATORY_CARE_PROVIDER_SITE_OTHER): Payer: Medicare Other | Admitting: *Deleted

## 2020-05-31 DIAGNOSIS — Z5982 Transportation insecurity: Secondary | ICD-10-CM

## 2020-05-31 DIAGNOSIS — F329 Major depressive disorder, single episode, unspecified: Secondary | ICD-10-CM

## 2020-05-31 DIAGNOSIS — F411 Generalized anxiety disorder: Secondary | ICD-10-CM

## 2020-05-31 DIAGNOSIS — Z9189 Other specified personal risk factors, not elsewhere classified: Secondary | ICD-10-CM

## 2020-05-31 NOTE — Chronic Care Management (AMB) (Signed)
Chronic Care Management    Clinical Social Work Note  05/31/2020 Name: Christina Rubio MRN: [id number] DOB: [birth year]  Christina Rubio is a 70 y.o. year old female who is a primary care patient of Bacigalupo, Dionne Bucy, MD. The CCM team was consulted to assist the patient with chronic disease management and/or care coordination needs related to: [employer] .   Engaged with patient by telephone for follow up visit in response to provider referral for social work chronic care management and care coordination services.   Consent to Services:  The patient was given information about Chronic Care Management services, agreed to services, and gave verbal consent prior to initiation of services.  Please see initial visit note for detailed documentation.   Patient agreed to services and consent obtained.   Assessment: Review of patient past medical history, allergies, medications, and health status, including review of relevant consultants reports was performed today as part of a comprehensive evaluation and provision of chronic care management and care coordination services.     SDOH (Social Determinants of Health) assessments and interventions performed:    Advanced Directives Status: Not addressed in this encounter.  CCM Care Plan  Allergies  Allergen Reactions  . Shellfish Allergy     Patient reported.  Marland Kitchen Epinephrine Palpitations    faint  . Penicillins Rash    Has patient had a PCN reaction causing immediate rash, facial/tongue/throat swelling, SOB or lightheadedness with hypotension:  Yes Has patient had a PCN reaction causing severe rash involving mucus membranes or skin necrosis: No Has patient had a PCN reaction that required hospitalization: No Has patient had a PCN reaction occurring within the last 10 years: No If all of the above answers are "NO", then may proceed with Cephalosporin use.;  . Strawberry Extract Rash    Outpatient Encounter Medications as of 05/31/2020   Medication Sig Note  . ALPRAZolam (XANAX) 0.5 MG tablet TAKE ONE TABLET BY MOUTH DAILY AS NEEDED FOR ANXIETY   . atorvastatin (LIPITOR) 20 MG tablet Take 1 tablet (20 mg total) by mouth daily.   . blood glucose meter kit and supplies Dispense based on patient and insurance preference. Use once daily to check for fasting blood sugars. (FOR ICD-10 E10.9, E11.9).   . famotidine (PEPCID) 20 MG tablet TAKE ONE TABLET BY MOUTH TWICE A DAY 04/24/2020: Reports usually taking once a day.  . levothyroxine (SYNTHROID) 100 MCG tablet Take 1 tablet (100 mcg total) by mouth daily.   Marland Kitchen loperamide (IMODIUM A-D) 2 MG tablet Take 2 mg by mouth 4 (four) times daily as needed for diarrhea or loose stools.   . metFORMIN (GLUCOPHAGE) 500 MG tablet Take 1 tablet (500 mg total) by mouth 2 (two) times daily with a meal.   . metoprolol succinate (TOPROL-XL) 50 MG 24 hr tablet Take 1 tablet (50 mg total) by mouth daily. Take with or immediately following a meal.   . Multiple Vitamins-Minerals (PRESERVISION AREDS 2) CAPS Take 1 tablet by mouth 2 (two) times daily.   . ondansetron (ZOFRAN ODT) 4 MG disintegrating tablet Take 1 tablet (4 mg total) by mouth every 8 (eight) hours as needed.   . Probiotic Product (PROBIOTIC PO) Take by mouth daily.    . sertraline (ZOLOFT) 100 MG tablet TAKE ONE AND ONE-HALF (1 & 1/2) TABLET BY MOUTH DAILY   . sulfamethoxazole-trimethoprim (BACTRIM DS) 800-160 MG tablet Take 1 tablet by mouth 2 (two) times daily.   . valsartan-hydrochlorothiazide (DIOVAN-HCT) 160-25 MG tablet TAKE ONE TABLET  BY MOUTH AT BEDTIME    No facility-administered encounter medications on file as of 05/31/2020.    Patient Active Problem List   Diagnosis Date Noted  . Lymphedema 05/23/2020  . Venous stasis ulcer of right calf limited to breakdown of skin ([place]) 05/23/2020  . Bilateral lower extremity edema 01/16/2020  . Candidal intertrigo 04/28/2019  . Esophageal dysphagia 01/13/2019  . Toenail deformity 01/13/2019   . Chest pain 09/22/2018  . Bradycardia 09/22/2018  . GAD (generalized anxiety disorder) 05/24/2018  . T2DM (type 2 diabetes mellitus) ([place]) 05/24/2018  . Venous stasis 07/28/2017  . Spinal stenosis 05/28/2017  . DJD (degenerative joint disease) 05/28/2017  . Hypertension associated with diabetes ([place]) 02/10/2016  . Hyperlipidemia associated with type 2 diabetes mellitus ([place]) 02/10/2016  . MDD (major depressive disorder) 02/10/2016  . Hypothyroidism due to acquired atrophy of thyroid 02/10/2016  . Morbid obesity ([place]) 01/09/2016    Conditions to be addressed/monitored:  Transportation, Mental Health Concerns  and Lacks knowledge of community resource: DME accessibility  Care Plan : General Social Work (Adult)  Updates made by Vern Claude, LCSW since 05/31/2020 12:00 AM    Problem: Mobility and Independence     Goal: Mobility and Independence Optimized   Start Date: 04/19/2020  Expected End Date: 09/17/2020  Recent Progress: On track  Priority: High  Note:   Current Barriers:  . Transportation and ADL IADL limitations  Clinical Social Work Clinical Goal(s):  Marland Kitchen Over the next 90 days, patient will work with the community resources identified to address needs related to modifications needed to increase safe mobility in her home  Interventions: . 1:1 collaboration with Brita Romp, Dionne Bucy, MD regarding development and update of comprehensive plan of care as evidenced by provider attestation and co-signature . Inter-disciplinary care team collaboration (see longitudinal plan of care) . Follow up phone call to patient regarding mental health follow up and accessibility needs . Patient confirmed that she has successfully worked with [employer] and has been transported to her podiatrist and has plans to be transported to her vascular doctor next week, . Patient has also been in contact [facility] with the wheelchair ramp ministry and they have agreed to put up the grab bars in her  bathroom and garage. She had to cancel appointment scheduled with them on Sunday due to her daughter's illness but will call them to re-schedule soon. . Patient has a follow up appointment with the [facility] scheduled and will call [employer] for transport . Emotional support and positive reinforcement continue to be provided to patient for progress made . Positive coping /self care emphasized . Discussed plans with patient for ongoing care management follow up and provided patient with direct contact information for care management team   Patient Goal for self-Care Activities Over the next 90 days, patient will:   - Patient will self administer medications as prescribed Patient will attend all scheduled provider appointments - begin a notebook of services in my neighborhood or community - follow-up on any referrals for help I am given - think ahead to make sure my need does not become an emergency - have a back-up plan  Follow up Plan: SW will follow up with patient by phone over the next 7-14 business days       Follow Up Plan: SW will follow up with patient by phone over the next 14 business days       [employer], [place]  [facility] Care Management [phone]

## 2020-05-31 NOTE — Patient Instructions (Signed)
Visit Information  Goals Addressed            This Visit's Progress   . Find Help in My Community       Timeframe:  Long-Range Goal Priority:  High Start Date:  04/19/20                           Expected End Date:    09/17/20                   Follow Up Date 3/22422   - begin a notebook of services in my neighborhood or community - continue to follow-up on any referrals for help I am given ([facility], [employer], [employer] wheelchair [employer]) - think ahead to make sure my need does not become an emergency - have a back-up plan to meet transportation needs   Why is this important?    Knowing how and where to find help for yourself or family in your neighborhood and community is an important skill.   You will want to take some steps to learn how.    Notes:        The patient verbalized understanding of instructions, educational materials, and care plan provided today and declined offer to receive copy of patient instructions, educational materials, and care plan.   Telephone follow up appointment with care management team member scheduled for:06/14/20   Elliot Gurney, [place] Worker  [facility] Care Management [phone]

## 2020-06-01 ENCOUNTER — Telehealth: Payer: Self-pay | Admitting: Family Medicine

## 2020-06-01 NOTE — Telephone Encounter (Signed)
Joya Gaskins, from [facility], calling and is requesting to have additional PT orders for the pt. She states that is needing to have 2x for 2wks. Please advise.    [phone] secure

## 2020-06-01 NOTE — Telephone Encounter (Signed)
Patient advise.  

## 2020-06-05 ENCOUNTER — Telehealth: Payer: Self-pay | Admitting: Family Medicine

## 2020-06-05 ENCOUNTER — Encounter (INDEPENDENT_AMBULATORY_CARE_PROVIDER_SITE_OTHER): Payer: BLUE CROSS/BLUE SHIELD | Admitting: Vascular Surgery

## 2020-06-05 ENCOUNTER — Other Ambulatory Visit: Payer: Self-pay | Admitting: Internal Medicine

## 2020-06-05 DIAGNOSIS — F418 Other specified anxiety disorders: Secondary | ICD-10-CM

## 2020-06-05 NOTE — Telephone Encounter (Signed)
Medication Refill - Medication: Zoloft 100 mg  Has the patient contacted their pharmacy? Yes.  They told because [facility] and [facility] are no refills left. (Agent: If no, request that the patient contact the pharmacy for the refill.) (Agent: If yes, when and what did the pharmacy advise?)  Preferred Pharmacy (with phone number or street name): [facility]   Agent: Please be advised that RX refills may take up to 3 business days. We ask that you follow-up with your pharmacy.

## 2020-06-05 NOTE — Telephone Encounter (Signed)
Joya Gaskins advised.   Thanks,   -Mickel Baas

## 2020-06-05 NOTE — Telephone Encounter (Signed)
OK for verbals 

## 2020-06-07 ENCOUNTER — Telehealth (INDEPENDENT_AMBULATORY_CARE_PROVIDER_SITE_OTHER): Payer: Medicare Other | Admitting: Family Medicine

## 2020-06-07 ENCOUNTER — Encounter: Payer: Self-pay | Admitting: Family Medicine

## 2020-06-07 ENCOUNTER — Other Ambulatory Visit: Payer: Self-pay

## 2020-06-07 DIAGNOSIS — L853 Xerosis cutis: Secondary | ICD-10-CM

## 2020-06-07 MED ORDER — SERTRALINE HCL 100 MG PO TABS: ORAL_TABLET | ORAL | 2 refills | Status: DC

## 2020-06-07 MED ORDER — TRIAMCINOLONE ACETONIDE 0.5 % EX OINT: 1.0000 "application " | TOPICAL_OINTMENT | Freq: Two times a day (BID) | CUTANEOUS | 1 refills | Status: DC

## 2020-06-07 NOTE — Progress Notes (Signed)
MyChart Video Visit    Virtual Visit via Video Note   This visit type was conducted due to national recommendations for restrictions regarding the COVID-19 Pandemic (e.g. social distancing) in an effort to limit this patient's exposure and mitigate transmission in our community. This patient is at least at moderate risk for complications without adequate follow up. This format is felt to be most appropriate for this patient at this time. Physical exam was limited by quality of the video and audio technology used for the visit.    Patient location: home Provider location: [place] involved in the visit: patient, provider  I discussed the limitations of evaluation and management by telemedicine and the availability of in person appointments. The patient expressed understanding and agreed to proceed.  Patient: Christina Rubio   DOB: Jan 11, 1951   70 y.o. Female  MRN: [id number] Visit Date: 06/07/2020  Today's healthcare provider: Lavon Paganini, MD   Chief Complaint  Patient presents with   Leg Swelling   Subjective    HPI  Leg swelling: Patient complains of swelling of the left lower leg. Swelling started 3 weeks ago and has worsened. Patient states that she has an infection in her leg and has completed a course of antibiotics. Associated symptoms includes pain, warmth  and redness of the left lower leg. She reports having  yellow-bloody colored drainage.   Social History   Tobacco Use   Smoking status: Never Smoker   Smokeless tobacco: Never Used  Vaping Use   Vaping Use: Never used  Substance Use Topics   Alcohol use: Yes    Comment: 1-2 drinks per year   Drug use: No      Medications: Outpatient Medications Prior to Visit  Medication Sig   ALPRAZolam (XANAX) 0.5 MG tablet TAKE ONE TABLET BY MOUTH DAILY AS NEEDED FOR ANXIETY   atorvastatin (LIPITOR) 20 MG tablet Take 1 tablet (20 mg total) by mouth daily.   blood glucose meter  kit and supplies Dispense based on patient and insurance preference. Use once daily to check for fasting blood sugars. (FOR ICD-10 E10.9, E11.9).   famotidine (PEPCID) 20 MG tablet TAKE ONE TABLET BY MOUTH TWICE A DAY   levothyroxine (SYNTHROID) 100 MCG tablet Take 1 tablet (100 mcg total) by mouth daily.   loperamide (IMODIUM A-D) 2 MG tablet Take 2 mg by mouth 4 (four) times daily as needed for diarrhea or loose stools.   metFORMIN (GLUCOPHAGE) 500 MG tablet Take 1 tablet (500 mg total) by mouth 2 (two) times daily with a meal.   metoprolol succinate (TOPROL-XL) 50 MG 24 hr tablet Take 1 tablet (50 mg total) by mouth daily. Take with or immediately following a meal.   Multiple Vitamins-Minerals (PRESERVISION AREDS 2) CAPS Take 1 tablet by mouth 2 (two) times daily.   ondansetron (ZOFRAN ODT) 4 MG disintegrating tablet Take 1 tablet (4 mg total) by mouth every 8 (eight) hours as needed.   Probiotic Product (PROBIOTIC PO) Take by mouth daily.    sertraline (ZOLOFT) 100 MG tablet TAKE ONE AND ONE-HALF (1 & 1/2) TABLET BY MOUTH DAILY   valsartan-hydrochlorothiazide (DIOVAN-HCT) 160-25 MG tablet TAKE ONE TABLET BY MOUTH AT BEDTIME   [DISCONTINUED] sulfamethoxazole-trimethoprim (BACTRIM DS) 800-160 MG tablet Take 1 tablet by mouth 2 (two) times daily. (Patient not taking: Reported on 06/07/2020)   No facility-administered medications prior to visit.    Review of Systems  Constitutional: Negative for appetite change, chills, fatigue and fever.  Respiratory:  Negative for chest tightness and shortness of breath.   Cardiovascular: Positive for leg swelling. Negative for chest pain and palpitations.  Gastrointestinal: Negative for abdominal pain, nausea and vomiting.  Skin: Positive for color change and wound.  Neurological: Negative for dizziness and weakness.      Objective    There were no vitals taken for this visit.   Physical Exam Constitutional:      Appearance: Normal  appearance.  Pulmonary:     Effort: Pulmonary effort is normal. No respiratory distress.  Neurological:     Mental Status: She is alert. Mental status is at baseline.     - see photo via mychart   Assessment & Plan     1. Xerosis of skin - ulceration is slowly healing - now with xerosis and dermatitis - trial of triamcinolone ointment BID - keep appt with [facility] regarding lymphedema and ulceration - continue [facility] RN wound care   Meds ordered this encounter  Medications   triamcinolone ointment (KENALOG) 0.5 %    Sig: Apply 1 application topically 2 (two) times daily.    Dispense:  45 g    Refill:  1     Return if symptoms worsen or fail to improve.     I discussed the assessment and treatment plan with the patient. The patient was provided an opportunity to ask questions and all were answered. The patient agreed with the plan and demonstrated an understanding of the instructions.   The patient was advised to call back or seek an in-person evaluation if the symptoms worsen or if the condition fails to improve as anticipated.  I, Lavon Paganini, MD, have reviewed all documentation for this visit. The documentation on 06/07/20 for the exam, diagnosis, procedures, and orders are all accurate and complete.   Daishia Fetterly, Dionne Bucy, MD, MPH [facility]

## 2020-06-08 ENCOUNTER — Telehealth: Payer: Self-pay

## 2020-06-11 ENCOUNTER — Telehealth: Payer: Self-pay | Admitting: Family Medicine

## 2020-06-11 NOTE — Telephone Encounter (Signed)
Pt is calling to see if Dr. B can extend her nursing orders to change the dressing on her leg. Pt states that her leg is still not well and she is still having a lot a pain with the leg. Pt will follow up with [facility] regarding.  Pt is also requesting a script for a bedside bariatric commode.  Please advise Cb- [phone]

## 2020-06-11 NOTE — Telephone Encounter (Signed)
OK for verbals to home health company for ongoing nursing wound care.  We will need to find out where she would like prescription sent for a bedside commode, but I am okay with the prescription.  Please go ahead and write it out and I will add the diagnosis code and sign it and then we can fax it wherever she wants to go.

## 2020-06-12 ENCOUNTER — Ambulatory Visit (INDEPENDENT_AMBULATORY_CARE_PROVIDER_SITE_OTHER): Payer: Medicare Other | Admitting: Vascular Surgery

## 2020-06-12 ENCOUNTER — Encounter (INDEPENDENT_AMBULATORY_CARE_PROVIDER_SITE_OTHER): Payer: Self-pay | Admitting: Vascular Surgery

## 2020-06-12 ENCOUNTER — Other Ambulatory Visit: Payer: Self-pay

## 2020-06-12 ENCOUNTER — Telehealth: Payer: Self-pay | Admitting: Family Medicine

## 2020-06-12 VITALS — BP 132/71 | HR 49 | Ht 64.0 in | Wt >= 6400 oz

## 2020-06-12 DIAGNOSIS — I152 Hypertension secondary to endocrine disorders: Secondary | ICD-10-CM

## 2020-06-12 DIAGNOSIS — R600 Localized edema: Secondary | ICD-10-CM

## 2020-06-12 DIAGNOSIS — E1169 Type 2 diabetes mellitus with other specified complication: Secondary | ICD-10-CM | POA: Diagnosis not present

## 2020-06-12 DIAGNOSIS — E1159 Type 2 diabetes mellitus with other circulatory complications: Secondary | ICD-10-CM | POA: Diagnosis not present

## 2020-06-12 DIAGNOSIS — I890 Lymphedema, not elsewhere classified: Secondary | ICD-10-CM | POA: Diagnosis not present

## 2020-06-12 NOTE — Progress Notes (Signed)
Patient ID: Christina Rubio, female   DOB: Nov 04, 1950, 70 y.o.   MRN: [id number]  Chief Complaint  Patient presents with  . New Patient (Initial Visit)    Venous stasis ulcer on Rt calf limited to breakdown of skin. Unspecified whether VV present    HPI Christina Rubio is a 70 y.o. female.  I am asked to see the patient by Dr. Brita Romp for evaluation of right leg swelling and ulceration.  The patient has been having swelling and discoloration that has been worsening for years, but over the past month or so she has developed ulceration with weeping of serous fluid from the right lower leg.  She is in a wheelchair.  She is large and cannot even see the back of her leg but she knows there is fluid running down it.  This is the leg that she broke several years ago and had to have femur surgery and surgery on the knee.  She has chronic discoloration of this leg.  She is not very mobile.  No known history of DVT or superficial thrombophlebitis to her knowledge.   Past Medical History:  Diagnosis Date  . Anxiety   . Arthritis   . Depression   . Hyperlipidemia   . Hypertension   . Neuromuscular disorder ([place])   . Osteoporosis   . PONV (postoperative nausea and vomiting)   . Skin cancer    non melanoma  . Spinal stenosis   . Thyroid disease    hyperthyroid s/p partial thyroidectomy    Past Surgical History:  Procedure Laterality Date  . CESAREAN SECTION    . CHOLECYSTECTOMY    . COLON SURGERY    . FEMUR IM NAIL Right 01/09/2016   Procedure: INTRAMEDULLARY (IM) RETROGRADE FEMORAL NAILING RIGHT DISTAL FEMUR;  Surgeon: Dorna Leitz, MD;  Location: [facility];  Service: Orthopedics;  Laterality: Right;  . LAPAROSCOPIC GASTRIC SLEEVE RESECTION    . Right thyroidectomy    . TONSILLECTOMY       Family History  Problem Relation Age of Onset  . Lung cancer Mother   . Prostate cancer Father   . Depression Daughter   . Anemia Daughter        [facility]  . Thyroid cancer Maternal Uncle   . Lung  cancer Paternal Uncle   . Stroke Maternal Grandmother   . Stroke Maternal Grandfather   . Stroke Paternal Grandmother   . Healthy Sister   . Healthy Brother   . Healthy Sister   . Emphysema Paternal Grandfather   . Colon cancer Neg Hx   . Breast cancer Neg Hx   . Ovarian cancer Neg Hx   . Cervical cancer Neg Hx       Social History   Tobacco Use  . Smoking status: Never Smoker  . Smokeless tobacco: Never Used  Vaping Use  . Vaping Use: Never used  Substance Use Topics  . Alcohol use: Yes    Comment: 1-2 drinks per year  . Drug use: No     Allergies  Allergen Reactions  . Shellfish Allergy     Patient reported.  Marland Kitchen Epinephrine Palpitations    faint  . Penicillins Rash    Has patient had a PCN reaction causing immediate rash, facial/tongue/throat swelling, SOB or lightheadedness with hypotension:  Yes Has patient had a PCN reaction causing severe rash involving mucus membranes or skin necrosis: No Has patient had a PCN reaction that required hospitalization: No Has patient had a PCN reaction  occurring within the last 10 years: No If all of the above answers are "NO", then may proceed with Cephalosporin use.;  . Strawberry Extract Rash    Current Outpatient Medications  Medication Sig Dispense Refill  . ALPRAZolam (XANAX) 0.5 MG tablet TAKE ONE TABLET BY MOUTH DAILY AS NEEDED FOR ANXIETY 30 tablet 0  . atorvastatin (LIPITOR) 20 MG tablet Take 1 tablet (20 mg total) by mouth daily. 90 tablet 3  . blood glucose meter kit and supplies Dispense based on patient and insurance preference. Use once daily to check for fasting blood sugars. (FOR ICD-10 E10.9, E11.9). 1 each 0  . famotidine (PEPCID) 20 MG tablet TAKE ONE TABLET BY MOUTH TWICE A DAY 60 tablet 3  . levothyroxine (SYNTHROID) 100 MCG tablet Take 1 tablet (100 mcg total) by mouth daily. 90 tablet 1  . loperamide (IMODIUM A-D) 2 MG tablet Take 2 mg by mouth 4 (four) times daily as needed for diarrhea or loose stools.     . metFORMIN (GLUCOPHAGE) 500 MG tablet Take 1 tablet (500 mg total) by mouth 2 (two) times daily with a meal. 180 tablet 3  . Multiple Vitamins-Minerals (PRESERVISION AREDS 2) CAPS Take 1 tablet by mouth 2 (two) times daily.    . ondansetron (ZOFRAN ODT) 4 MG disintegrating tablet Take 1 tablet (4 mg total) by mouth every 8 (eight) hours as needed. 20 tablet 0  . Probiotic Product (PROBIOTIC PO) Take by mouth daily.     . sertraline (ZOLOFT) 100 MG tablet TAKE ONE AND ONE-HALF (1 & 1/2) TABLET BY MOUTH DAILY 135 tablet 2  . triamcinolone ointment (KENALOG) 0.5 % Apply 1 application topically 2 (two) times daily. 45 g 1  . valsartan-hydrochlorothiazide (DIOVAN-HCT) 160-25 MG tablet TAKE ONE TABLET BY MOUTH AT BEDTIME 90 tablet 1  . metoprolol succinate (TOPROL-XL) 50 MG 24 hr tablet Take 1 tablet (50 mg total) by mouth daily. Take with or immediately following a meal. 90 tablet 3   No current facility-administered medications for this visit.      REVIEW OF SYSTEMS (Negative unless checked)  Constitutional: _0 Weight loss  _1 Fever  _2 Chills Cardiac: _3 Chest pain   _4 Chest pressure   _5 Palpitations   _6 Shortness of breath when laying flat   _7 Shortness of breath at rest   _8 Shortness of breath with exertion. Vascular:  _9 Pain in legs with walking   _10 Pain in legs at rest   _11 Pain in legs when laying flat   _12 Claudication   _13 Pain in feet when walking  _14 Pain in feet at rest  _15 Pain in feet when laying flat   _16 History of DVT   _17 Phlebitis   _18 Swelling in legs   _19 Varicose veins   _20 Non-healing ulcers Pulmonary:   _21 Uses home oxygen   _22 Productive cough   _23 Hemoptysis   _24 Wheeze  _25 COPD   _26 Asthma Neurologic:  _27 Dizziness  _28 Blackouts   _29 Seizures   _30 History of stroke   _31 History of TIA  _32 Aphasia   _33 Temporary blindness   _34 Dysphagia   _35 Weakness or numbness in arms   _36 Weakness or numbness in legs Musculoskeletal:  _37 Arthritis   _38 Joint swelling   _39 Joint pain   _40 Low back  pain Hematologic:  _41 Easy bruising  _42 Easy bleeding   _43 Hypercoagulable state   _44 Anemic  _45 Hepatitis Gastrointestinal:  _46 Blood in stool   _47 Vomiting blood  _48 Gastroesophageal reflux/heartburn   _49 Abdominal pain Genitourinary:  _50 Chronic kidney disease   _51 Difficult urination  _52 Frequent urination  _53 Burning with urination   _54 Hematuria Skin:  _55 Rashes   [  x]Ulcers   _0 Wounds Psychological:  _1 History of anxiety   _2  History of major depression.    Physical Exam BP 132/71   Pulse (!) 49   Ht _3  (1.626 m)   Wt (!) 400 lb (181.4 kg)   BMI 68.66 kg/m  Gen:  WD/WN, NAD.  Morbidly obese Head: [place]/AT, No temporalis wasting.  Ear/Nose/Throat: Hearing grossly intact, nares w/o erythema or drainage, oropharynx w/o Erythema/Exudate Eyes: Conjunctiva clear, sclera non-icteric  Neck: trachea midline.  No JVD.  Pulmonary:  Good air movement, respirations not labored, no use of accessory muscles  Cardiac: RRR, no JVD Vascular:  Vessel Right Left  Radial Palpable Palpable                          PT  not palpable  not palpable  DP  trace  1+   Gastrointestinal:. No masses, surgical incisions, or scars. Musculoskeletal: M/S 5/5 throughout.  Extremities without ischemic changes.  No deformity or atrophy.  Moderate stasis dermatitis changes present to the right leg with multiple superficial ulcerations throughout the right lower leg particularly laterally and posteriorly.  Mild surrounding erythema.  3+ right lower extremity edema, 1-2+ left lower extremity edema.  In a wheelchair. Neurologic: Sensation grossly intact in extremities.  Symmetrical.  Speech is fluent. Motor exam as listed above. Psychiatric: Judgment intact, Mood & affect appropriate for pt's clinical situation. Dermatologic: Right lower leg ulcerations as above    Radiology No results found.  Labs Recent Results (from the past 2160 hour(s))  Hemoglobin A1c     Status: Abnormal   Collection Time: 05/22/20  4:44 PM   Result Value Ref Range   Hgb A1c MFr Bld 7.0 (H) 4.8 - 5.6 %    Comment:          Prediabetes: 5.7 - 6.4          Diabetes: >6.4          Glycemic control for adults with diabetes: <7.0    Est. average glucose Bld gHb Est-mCnc 154 mg/dL  TSH     Status: Abnormal   Collection Time: 05/22/20  4:44 PM  Result Value Ref Range   TSH 6.200 (H) 0.450 - 4.500 uIU/mL  Comprehensive metabolic panel     Status: Abnormal   Collection Time: 05/22/20  4:44 PM  Result Value Ref Range   Glucose 131 (H) 65 - 99 mg/dL   BUN 16 8 - 27 mg/dL   Creatinine, Ser 1.19 (H) 0.57 - 1.00 mg/dL   eGFR 50 (L) >59 mL/min/1.73    Comment: **In accordance with recommendations from the NKF-ASN Task force,**   [facility] has updated its eGFR calculation to the 2021 CKD-EPI   creatinine equation that estimates kidney function without a race   variable.    BUN/Creatinine Ratio 13 12 - 28   Sodium 140 134 - 144 mmol/L   Potassium 4.1 3.5 - 5.2 mmol/L   Chloride 101 96 - 106 mmol/L   CO2 22 20 - 29 mmol/L   Calcium 9.4 8.7 - 10.3 mg/dL   Total Protein 7.3 6.0 - 8.5 g/dL   Albumin 4.3 3.8 - 4.8 g/dL   Globulin, Total 3.0 1.5 - 4.5 g/dL   Albumin/Globulin Ratio 1.4 1.2 - 2.2   Bilirubin Total 0.5 0.0 - 1.2 mg/dL   Alkaline Phosphatase 42 (L) 44 - 121 IU/L   AST 16 0 - 40 IU/L   ALT 15 0 -  32 IU/L  Lipid panel     Status: Abnormal   Collection Time: 05/22/20  4:44 PM  Result Value Ref Range   Cholesterol, Total 144 100 - 199 mg/dL   Triglycerides 134 0 - 149 mg/dL   HDL 38 (L) >39 mg/dL   VLDL Cholesterol Cal 24 5 - 40 mg/dL   LDL Chol Calc (NIH) 82 0 - 99 mg/dL   Chol/HDL Ratio 3.8 0.0 - 4.4 ratio    Comment:                                   T. 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  Were going to wrap her in [place] today.  She is already had a negative DVT study.  A lymphedema pump will be an excellent adjuvant therapy once we get her wounds healed and get her in some sort of compression garment.  Bilateral lower extremity edema Severe. Now with weeping. [employer] on the right. Left leg needs compression and elevation.  T2DM (type 2 diabetes mellitus) (HCC) blood glucose control important in reducing the progression of atherosclerotic disease. Also, involved in wound healing. On appropriate medications.   Hypertension associated with diabetes ([place]) blood pressure control important in reducing the progression of atherosclerotic disease. On appropriate oral medications.       Leotis Pain 06/12/2020, 12:35 PM   This note was created with Dragon medical transcription system.  Any errors from dictation are unintentional.

## 2020-06-12 NOTE — Telephone Encounter (Signed)
Christina Rubio w/ [facility] to get clarification on what order is needed for patient. [phone]

## 2020-06-12 NOTE — Assessment & Plan Note (Signed)
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  Were going to wrap her in [place] today.  She is already had a negative DVT study.  A lymphedema pump will be an excellent adjuvant therapy once we get her wounds healed and get her in some sort of compression garment.

## 2020-06-12 NOTE — Patient Instructions (Signed)
Lymphedema  Lymphedema is swelling that is caused by the abnormal collection of lymph in the tissues under the skin. Lymph is excess fluid from the tissues in your body that is removed through the lymphatic system. This system is part of your body's defense system (immune system) and includes lymph nodes and lymph vessels. The lymph vessels collect and carry the excess fluid, fats, proteins, and waste from the tissues of the body to the bloodstream. This system also works to clean and remove bacteria and waste products from the body. Lymphedema occurs when the lymphatic system is blocked. When the lymph vessels or lymph nodes are blocked or damaged, lymph does not drain properly. This causes an abnormal buildup of lymph, which leads to swelling in the affected area. This may include the trunk area, or an arm or leg. Lymphedema cannot be cured by medicines, but various methods can be used to help reduce the swelling. What are the causes? The cause of this condition depends on the type of lymphedema that you have.  Primary lymphedema is caused by the absence of lymph vessels or having abnormal lymph vessels at birth.  Secondary lymphedema occurs when lymph vessels are blocked or damaged. Secondary lymphedema is more common. Common causes of lymph vessel blockage include: ? Skin infection, such as cellulitis. ? Infection by parasites (filariasis). ? Injury. ? Radiation therapy. ? Cancer. ? Formation of scar tissue. ? Surgery. What are the signs or symptoms? Symptoms of this condition include:  Swelling of the arm or leg.  A heavy or tight feeling in the arm or leg.  Swelling of the feet, toes, or fingers. Shoes or rings may fit more tightly than before.  Redness of the skin over the affected area.  Limited movement of the affected limb.  Sensitivity to touch or discomfort in the affected limb. How is this diagnosed? This condition may be diagnosed based on:  Your symptoms and medical  history.  A physical exam.  Bioimpedance spectroscopy. In this test, painless electrical currents are used to measure fluid levels in your body.  Imaging tests, such as: ? MRI. ? CT scan. ? Duplex ultrasound. This test uses sound waves to produce images of the vessels and the blood flow on a screen. ? Lymphoscintigraphy. In this test, a low dose of a radioactive substance is injected to trace the flow of lymph through your lymph vessels. ? Lymphangiography. In this test, a contrast dye is injected into the lymph vessel to help show blockages. How is this treated? If an underlying condition is causing the lymphedema, that condition will be treated. For example, antibiotic medicines may be used to treat an infection. Treatment for this condition will depend on the cause of your lymphedema. Treatment may include:  Complete decongestive therapy (CDT). This is done by a certified lymphedema therapist to reduce fluid congestion. This therapy includes: ? Skin care. ? Compression wrapping of the affected area. ? Manual lymph drainage. This is a special massage technique that promotes lymph drainage out of a limb. ? Specific exercises. Certain exercises can help fluid move out of the affected limb.  Compression. Various methods may be used to apply pressure to the affected limb to reduce the swelling. They include: ? Wearing compression stockings or sleeves on the affected limb. ? Wrapping the affected limb with special bandages.  Surgery. This is usually done for severe cases only. For example, surgery may be done if you have trouble moving the limb or if the swelling does not  get better with other treatments.   Follow these instructions at home: Self-care  The affected area is more likely to become injured or infected. Take these steps to help prevent infection: ? Keep the affected area clean and dry. ? Use approved creams or lotions to keep the skin moisturized. ? Protect your skin from  cuts:  Use gloves while cooking or gardening.  Do not walk barefoot.  If you shave the affected area, use an Copy.  Do not wear tight clothes, shoes, or jewelry.  Eat a healthy diet that includes a lot of fruits and vegetables. Activity  Do exercises as told by your health care provider.  Do not sit with your legs crossed.  When possible, keep the affected limb raised (elevated) above the level of your heart.  Avoid carrying things with an arm that is affected by lymphedema. General instructions  Wear compression stockings or sleeves as told by your health care provider.  Note any changes in size of the affected limb. You may be instructed to take regular measurements and keep track of them.  Take over-the-counter and prescription medicines only as told by your health care provider.  If you were prescribed an antibiotic medicine, take or apply it as told by your health care provider. Do not stop using the antibiotic even if you start to feel better or if your condition improves.  Do not use heating pads or ice packs on the affected area.  Avoid having blood draws, IV insertions, or blood pressure checks on the affected limb.  Keep all follow-up visits. This is important. Contact a health care provider if you:  Continue to have swelling in your limb.  Have fluid leaking from the skin of your swollen limb.  Have a cut that does not heal.  Have redness or pain in the affected area.  Develop purplish spots, rash, blisters, or sores (lesions) on your affected limb. Get help right away if you:  Have new swelling in your limb that starts suddenly.  Have shortness of breath or chest pain.  Have a fever or chills. These symptoms may represent a serious problem that is an emergency. Do not wait to see if the symptoms will go away. Get medical help right away. Call your local emergency services (911 in the [place] Do not drive yourself to the  hospital. Summary  Lymphedema is swelling that is caused by the abnormal collection of lymph in the tissues under the skin.  Lymph is fluid from the tissues in your body that is removed through the lymphatic system. This system collects and carries excess fluid, fats, proteins, and wastes from the tissues of the body to the bloodstream.  Lymphedema causes swelling, pain, and redness in the affected area. This may include the trunk area, or an arm or leg.  Treatment for this condition may depend on the cause of your lymphedema. Treatment may include treating the underlying cause, complete decongestive therapy (CDT), compression methods, or surgery. This information is not intended to replace advice given to you by your health care provider. Make sure you discuss any questions you have with your health care provider. Document Revised: 01/04/2020 Document Reviewed: 01/04/2020 Elsevier Patient Education  2021 [employer].

## 2020-06-12 NOTE — Assessment & Plan Note (Signed)
blood pressure control important in reducing the progression of atherosclerotic disease. On appropriate oral medications.  

## 2020-06-12 NOTE — Telephone Encounter (Signed)
Mendel Ryder with [facility] is calling in to request a VO to recert pt for skilled nursing. Mendel Ryder would like to add 1 visit this week.   Please assist   [phone]

## 2020-06-12 NOTE — Assessment & Plan Note (Signed)
blood glucose control important in reducing the progression of atherosclerotic disease. Also, involved in wound healing. On appropriate medications.  

## 2020-06-12 NOTE — Assessment & Plan Note (Signed)
Severe. Now with weeping. [employer] on the right. Left leg needs compression and elevation.

## 2020-06-13 NOTE — Telephone Encounter (Signed)
Christina Rubio needs a Verbal order for an additional skilled nursing visit for this to re- certify the patient

## 2020-06-14 ENCOUNTER — Telehealth: Payer: Self-pay | Admitting: *Deleted

## 2020-06-14 NOTE — Telephone Encounter (Signed)
  Chronic Care Management   Outreach Note  06/14/2020 Name: Christina Rubio MRN: [id number] DOB: [birth year]  Referred by: [name], MD Reason for referral : Chronic Care Management (/)   An unsuccessful telephone outreach was attempted today. The patient was referred to the case management team for assistance with care management and care coordination.   Follow Up Plan: Telephone follow up appointment with care management team member scheduled for: 06/26/20    Elliot Gurney, [place] Worker  [facility] [phone]

## 2020-06-14 NOTE — Telephone Encounter (Signed)
OK for verbals 

## 2020-06-15 ENCOUNTER — Telehealth: Payer: Self-pay | Admitting: Family Medicine

## 2020-06-15 ENCOUNTER — Telehealth (INDEPENDENT_AMBULATORY_CARE_PROVIDER_SITE_OTHER): Payer: Self-pay

## 2020-06-15 NOTE — Telephone Encounter (Signed)
Christina Rubio from [facility] advised of verbal orders to continue nursing and home health.  Mrs. Christina Rubio advised to check fasting BS daily.

## 2020-06-15 NOTE — Telephone Encounter (Signed)
Pt would like to know how many times a day she needs to check her BS and when

## 2020-06-15 NOTE — Telephone Encounter (Signed)
Patient advised.

## 2020-06-15 NOTE — Telephone Encounter (Signed)
Mendel Ryder from [facility] left a voicemail about home health orders. I informed the nurse that the patient will be receiving home health for unna wraps. The patient was informed that she will only have to come in the office for her follow up and someone will come in the home to change her wraps.

## 2020-06-17 DIAGNOSIS — I10 Essential (primary) hypertension: Secondary | ICD-10-CM | POA: Diagnosis not present

## 2020-06-17 DIAGNOSIS — M4800 Spinal stenosis, site unspecified: Secondary | ICD-10-CM | POA: Diagnosis not present

## 2020-06-17 DIAGNOSIS — R600 Localized edema: Secondary | ICD-10-CM | POA: Diagnosis not present

## 2020-06-17 DIAGNOSIS — F329 Major depressive disorder, single episode, unspecified: Secondary | ICD-10-CM | POA: Diagnosis not present

## 2020-06-17 DIAGNOSIS — E1169 Type 2 diabetes mellitus with other specified complication: Secondary | ICD-10-CM | POA: Diagnosis not present

## 2020-06-17 DIAGNOSIS — E1151 Type 2 diabetes mellitus with diabetic peripheral angiopathy without gangrene: Secondary | ICD-10-CM | POA: Diagnosis not present

## 2020-06-17 DIAGNOSIS — B372 Candidiasis of skin and nail: Secondary | ICD-10-CM | POA: Diagnosis not present

## 2020-06-17 DIAGNOSIS — E785 Hyperlipidemia, unspecified: Secondary | ICD-10-CM | POA: Diagnosis not present

## 2020-06-17 DIAGNOSIS — E7800 Pure hypercholesterolemia, unspecified: Secondary | ICD-10-CM | POA: Diagnosis not present

## 2020-06-17 DIAGNOSIS — F411 Generalized anxiety disorder: Secondary | ICD-10-CM | POA: Diagnosis not present

## 2020-06-17 DIAGNOSIS — I872 Venous insufficiency (chronic) (peripheral): Secondary | ICD-10-CM | POA: Diagnosis not present

## 2020-06-17 DIAGNOSIS — R1319 Other dysphagia: Secondary | ICD-10-CM | POA: Diagnosis not present

## 2020-06-17 DIAGNOSIS — L03115 Cellulitis of right lower limb: Secondary | ICD-10-CM | POA: Diagnosis not present

## 2020-06-17 DIAGNOSIS — M1990 Unspecified osteoarthritis, unspecified site: Secondary | ICD-10-CM | POA: Diagnosis not present

## 2020-06-17 DIAGNOSIS — E039 Hypothyroidism, unspecified: Secondary | ICD-10-CM | POA: Diagnosis not present

## 2020-06-19 ENCOUNTER — Encounter (INDEPENDENT_AMBULATORY_CARE_PROVIDER_SITE_OTHER): Payer: BLUE CROSS/BLUE SHIELD

## 2020-06-21 DIAGNOSIS — R54 Age-related physical debility: Secondary | ICD-10-CM | POA: Diagnosis not present

## 2020-06-26 ENCOUNTER — Telehealth: Payer: Self-pay | Admitting: *Deleted

## 2020-06-26 ENCOUNTER — Encounter (INDEPENDENT_AMBULATORY_CARE_PROVIDER_SITE_OTHER): Payer: BLUE CROSS/BLUE SHIELD

## 2020-06-26 NOTE — Telephone Encounter (Signed)
   06/26/2020  Christina Rubio 11/17/1950 [id number]  Phone call to patient for scheduled appointment. Patient requested to re-schedule due to the [facility] nurse arriving for a visit. Phone call re-scheduled for 06/27/20.    Elliot Gurney, [place] Worker  [facility] [phone]

## 2020-06-27 ENCOUNTER — Encounter: Payer: Self-pay | Admitting: *Deleted

## 2020-06-27 ENCOUNTER — Telehealth: Payer: Self-pay | Admitting: *Deleted

## 2020-06-27 NOTE — Telephone Encounter (Signed)
   06/27/2020  Christina Rubio 12-21-1950 [id number]   Chronic Care Management   Outreach Note  06/27/2020 Name: Christina Rubio MRN: [id number] DOB: 10/20/1950  Referred by: [name], MD Reason for referral : No chief complaint on file.   An unsuccessful telephone outreach was attempted today. The patient was referred to the case management team for assistance with care management and care coordination.   Follow Up Plan: Telephone follow up appointment with care management team member scheduled for: 07/05/20   Elliot Gurney, [place] Worker  [facility] [phone]

## 2020-06-27 NOTE — Telephone Encounter (Signed)
This encounter was created in error - please disregard.

## 2020-07-03 ENCOUNTER — Ambulatory Visit (INDEPENDENT_AMBULATORY_CARE_PROVIDER_SITE_OTHER): Payer: BLUE CROSS/BLUE SHIELD | Admitting: Nurse Practitioner

## 2020-07-05 ENCOUNTER — Ambulatory Visit (INDEPENDENT_AMBULATORY_CARE_PROVIDER_SITE_OTHER): Payer: Medicare Other | Admitting: *Deleted

## 2020-07-05 ENCOUNTER — Telehealth (INDEPENDENT_AMBULATORY_CARE_PROVIDER_SITE_OTHER): Payer: Self-pay

## 2020-07-05 DIAGNOSIS — Z5982 Transportation insecurity: Secondary | ICD-10-CM

## 2020-07-05 DIAGNOSIS — F411 Generalized anxiety disorder: Secondary | ICD-10-CM

## 2020-07-05 DIAGNOSIS — Z9189 Other specified personal risk factors, not elsewhere classified: Secondary | ICD-10-CM

## 2020-07-05 DIAGNOSIS — F329 Major depressive disorder, single episode, unspecified: Secondary | ICD-10-CM

## 2020-07-05 NOTE — Telephone Encounter (Signed)
Lindsey from [facility] left a voicemail requesting verbal orders to increase unna wraps twice weekly. I left a voicemail stating that orders are fine to start.

## 2020-07-06 ENCOUNTER — Ambulatory Visit: Payer: Self-pay

## 2020-07-06 DIAGNOSIS — I878 Other specified disorders of veins: Secondary | ICD-10-CM

## 2020-07-06 DIAGNOSIS — E1169 Type 2 diabetes mellitus with other specified complication: Secondary | ICD-10-CM

## 2020-07-06 NOTE — Patient Instructions (Signed)
Visit Information  Goals Addressed            This Visit's Progress   . Find Help in My Community       Timeframe:  Long-Range Goal Priority:  High Start Date:  04/19/20                           Expected End Date:    09/17/20                   Follow Up Date 07/19/20   - begin a notebook of services in my neighborhood or community - continue to follow-up on any referrals for help I am given ([facility], [place], [employer] wheelchair [employer]) - think ahead to make sure my need does not become an emergency - have a back-up plan to meet transportation needs   Why is this important?    Knowing how and where to find help for yourself or family in your neighborhood and community is an important skill.   You will want to take some steps to learn how.    Notes:        The patient verbalized understanding of instructions, educational materials, and care plan provided today and declined offer to receive copy of patient instructions, educational materials, and care plan.   Telephone follow up appointment with care management team member scheduled for:07/19/20   Elliot Gurney, [place] Worker  [facility] [phone]

## 2020-07-06 NOTE — Chronic Care Management (AMB) (Signed)
  Chronic Care Management   Note  07/06/2020 Name: Christina Rubio MRN: [id number] DOB: 25-Apr-1950   Brief outreach with Christina Rubio regarding care management follow-up. Requested to follow-up after she is evaluated by the Vascular Surgery team. Agreeable to care management outreach on 07/20/20. Agreed to call if needed prior to the scheduled outreach.   Follow up plan: Will follow up as requested on 07/20/20  Horris Latino [facility] Care Management [facility] [phone]

## 2020-07-06 NOTE — Chronic Care Management (AMB) (Signed)
Chronic Care Management    Clinical Social Work Note  07/06/2020 Name: Christina Rubio MRN: [id number] DOB: 07-17-1950  [name] is a 70 y.o. year old female who is a primary care patient of Bacigalupo, Dionne Bucy, MD. The CCM team was consulted to assist the patient with chronic disease management and/or care coordination needs related to: [employer]  and [place] and [employer].   Engaged with patient by telephone for follow up visit in response to provider referral for social work chronic care management and care coordination services.   Consent to Services:  The patient was given information about Chronic Care Management services, agreed to services, and gave verbal consent prior to initiation of services.  Please see initial visit note for detailed documentation.   Patient agreed to services and consent obtained.   Assessment: Review of patient past medical history, allergies, medications, and health status, including review of relevant consultants reports was performed today as part of a comprehensive evaluation and provision of chronic care management and care coordination services.     SDOH (Social Determinants of Health) assessments and interventions performed:    Advanced Directives Status: Not addressed in this encounter.  CCM Care Plan  Allergies  Allergen Reactions  . Shellfish Allergy     Patient reported.  Marland Kitchen Epinephrine Palpitations    faint  . Penicillins Rash    Has patient had a PCN reaction causing immediate rash, facial/tongue/throat swelling, SOB or lightheadedness with hypotension:  Yes Has patient had a PCN reaction causing severe rash involving mucus membranes or skin necrosis: No Has patient had a PCN reaction that required hospitalization: No Has patient had a PCN reaction occurring within the last 10 years: No If all of the above answers are "NO", then may proceed with Cephalosporin use.;  . Strawberry Extract Rash     Outpatient Encounter Medications as of 07/05/2020  Medication Sig Note  . ALPRAZolam (XANAX) 0.5 MG tablet TAKE ONE TABLET BY MOUTH DAILY AS NEEDED FOR ANXIETY   . atorvastatin (LIPITOR) 20 MG tablet Take 1 tablet (20 mg total) by mouth daily.   . blood glucose meter kit and supplies Dispense based on patient and insurance preference. Use once daily to check for fasting blood sugars. (FOR ICD-10 E10.9, E11.9).   . famotidine (PEPCID) 20 MG tablet TAKE ONE TABLET BY MOUTH TWICE A DAY 04/24/2020: Reports usually taking once a day.  . levothyroxine (SYNTHROID) 100 MCG tablet Take 1 tablet (100 mcg total) by mouth daily.   Marland Kitchen loperamide (IMODIUM A-D) 2 MG tablet Take 2 mg by mouth 4 (four) times daily as needed for diarrhea or loose stools.   . metFORMIN (GLUCOPHAGE) 500 MG tablet Take 1 tablet (500 mg total) by mouth 2 (two) times daily with a meal.   . Multiple Vitamins-Minerals (PRESERVISION AREDS 2) CAPS Take 1 tablet by mouth 2 (two) times daily.   . ondansetron (ZOFRAN ODT) 4 MG disintegrating tablet Take 1 tablet (4 mg total) by mouth every 8 (eight) hours as needed.   . Probiotic Product (PROBIOTIC PO) Take by mouth daily.    . sertraline (ZOLOFT) 100 MG tablet TAKE ONE AND ONE-HALF (1 & 1/2) TABLET BY MOUTH DAILY   . triamcinolone ointment (KENALOG) 0.5 % Apply 1 application topically 2 (two) times daily.   . valsartan-hydrochlorothiazide (DIOVAN-HCT) 160-25 MG tablet TAKE ONE TABLET BY MOUTH AT BEDTIME    No facility-administered encounter medications on file as of 07/05/2020.    Patient Active Problem List  Diagnosis Date Noted  . Lymphedema 05/23/2020  . Venous stasis ulcer of right calf limited to breakdown of skin ([place]) 05/23/2020  . Bilateral lower extremity edema 01/16/2020  . Candidal intertrigo 04/28/2019  . Esophageal dysphagia 01/13/2019  . Toenail deformity 01/13/2019  . Chest pain 09/22/2018  . Bradycardia 09/22/2018  . GAD (generalized anxiety disorder) 05/24/2018   . T2DM (type 2 diabetes mellitus) ([place]) 05/24/2018  . Venous stasis 07/28/2017  . Spinal stenosis 05/28/2017  . DJD (degenerative joint disease) 05/28/2017  . Hypertension associated with diabetes ([place]) 02/10/2016  . Hyperlipidemia associated with type 2 diabetes mellitus ([place]) 02/10/2016  . MDD (major depressive disorder) 02/10/2016  . Hypothyroidism due to acquired atrophy of thyroid 02/10/2016  . Morbid obesity ([place]) 01/09/2016    Conditions to be addressed/monitored:  Mental Health Concerns  and Lacks knowledge of community resource: DME and Transportation  Care Plan : General Social Work (Adult)  Updates made by Vern Claude, LCSW since 07/06/2020 12:00 AM    Problem: Mobility and Independence     Goal: Mobility and Independence Optimized   Start Date: 04/19/2020  Expected End Date: 09/17/2020  Recent Progress: On track  Priority: High  Note:   Current Barriers:  . Transportation and ADL IADL limitations  Clinical Social Work Clinical Goal(s):  Marland Kitchen Over the next 90 days, patient will work with the community resources identified to address needs related to modifications needed to increase safe mobility in her home  Interventions: . 1:1 collaboration with Brita Romp, Dionne Bucy, MD regarding development and update of comprehensive plan of care as evidenced by provider attestation and co-signature . Inter-disciplinary care team collaboration (see longitudinal plan of care) . Follow up phone call to patient regarding mental health follow up and accessibility needs . Patient confirmed that she has successfully worked with [place] a ride to coordinate transport to her medical appointments . Patient has also been in contact [facility] with the wheelchair ramp ministry regarding the grab bars in her bathroom and garage. She has decided to decline this referral stating that she now working on getting a walk-in shower which she feels will be much more beneficial. She further confirms  obtaining a new toilet seat . Patient has a follow up appointment with the [place] scheduled and will call dial a ride for transport  . Emotional support and positive reinforcement continue to be provided in regards to family member illness . Positive coping /self care emphasized . Discussed plans with patient for ongoing care management follow up and provided patient with direct contact information for care management team   Patient Goal for self-Care Activities Over the next 90 days, patient will:   - Patient will self administer medications as prescribed Patient will attend all scheduled provider appointments - begin a notebook of services in my neighborhood or community - follow-up on any referrals for help I am given - think ahead to make sure my need does not become an emergency - have a back-up plan  Follow up Plan: SW will follow up with patient by phone over the next 7-14 business days       Follow Up Plan: Appointment scheduled for SW follow up with client by phone on: 07/19/20       Elliot Gurney, [place] Worker  [facility] [phone]

## 2020-07-17 ENCOUNTER — Ambulatory Visit (INDEPENDENT_AMBULATORY_CARE_PROVIDER_SITE_OTHER): Payer: BLUE CROSS/BLUE SHIELD | Admitting: Nurse Practitioner

## 2020-07-17 DIAGNOSIS — F329 Major depressive disorder, single episode, unspecified: Secondary | ICD-10-CM | POA: Diagnosis not present

## 2020-07-17 DIAGNOSIS — M1990 Unspecified osteoarthritis, unspecified site: Secondary | ICD-10-CM | POA: Diagnosis not present

## 2020-07-17 DIAGNOSIS — R600 Localized edema: Secondary | ICD-10-CM | POA: Diagnosis not present

## 2020-07-17 DIAGNOSIS — E785 Hyperlipidemia, unspecified: Secondary | ICD-10-CM | POA: Diagnosis not present

## 2020-07-17 DIAGNOSIS — E039 Hypothyroidism, unspecified: Secondary | ICD-10-CM | POA: Diagnosis not present

## 2020-07-17 DIAGNOSIS — E7800 Pure hypercholesterolemia, unspecified: Secondary | ICD-10-CM | POA: Diagnosis not present

## 2020-07-17 DIAGNOSIS — B372 Candidiasis of skin and nail: Secondary | ICD-10-CM | POA: Diagnosis not present

## 2020-07-17 DIAGNOSIS — M4800 Spinal stenosis, site unspecified: Secondary | ICD-10-CM | POA: Diagnosis not present

## 2020-07-17 DIAGNOSIS — I872 Venous insufficiency (chronic) (peripheral): Secondary | ICD-10-CM | POA: Diagnosis not present

## 2020-07-17 DIAGNOSIS — F411 Generalized anxiety disorder: Secondary | ICD-10-CM | POA: Diagnosis not present

## 2020-07-17 DIAGNOSIS — E1169 Type 2 diabetes mellitus with other specified complication: Secondary | ICD-10-CM | POA: Diagnosis not present

## 2020-07-17 DIAGNOSIS — R1319 Other dysphagia: Secondary | ICD-10-CM | POA: Diagnosis not present

## 2020-07-17 DIAGNOSIS — L03115 Cellulitis of right lower limb: Secondary | ICD-10-CM | POA: Diagnosis not present

## 2020-07-17 DIAGNOSIS — I10 Essential (primary) hypertension: Secondary | ICD-10-CM | POA: Diagnosis not present

## 2020-07-17 DIAGNOSIS — E1151 Type 2 diabetes mellitus with diabetic peripheral angiopathy without gangrene: Secondary | ICD-10-CM | POA: Diagnosis not present

## 2020-07-19 ENCOUNTER — Ambulatory Visit: Payer: Self-pay | Admitting: *Deleted

## 2020-07-19 DIAGNOSIS — I152 Hypertension secondary to endocrine disorders: Secondary | ICD-10-CM

## 2020-07-19 DIAGNOSIS — E1159 Type 2 diabetes mellitus with other circulatory complications: Secondary | ICD-10-CM | POA: Diagnosis not present

## 2020-07-19 DIAGNOSIS — F411 Generalized anxiety disorder: Secondary | ICD-10-CM

## 2020-07-19 DIAGNOSIS — F329 Major depressive disorder, single episode, unspecified: Secondary | ICD-10-CM | POA: Diagnosis not present

## 2020-07-19 DIAGNOSIS — E1169 Type 2 diabetes mellitus with other specified complication: Secondary | ICD-10-CM

## 2020-07-19 NOTE — Patient Instructions (Signed)
Visit Information  Goals Addressed            This Visit's Progress   . Find Help in My Community       Timeframe:  Long-Range Goal Priority:  High Start Date:  04/19/20                           Expected End Date:    09/17/20                   Follow Up Date 08/02/20   - begin a notebook of services in my neighborhood or community - continue to follow-up on any referrals for help I am given ([facility], [place]) - think ahead to make sure my need does not become an emergency - have a back-up plan to meet transportation needs   Why is this important?    Knowing how and where to find help for yourself or family in your neighborhood and community is an important skill.   You will want to take some steps to learn how.    Notes:        The patient verbalized understanding of instructions, educational materials, and care plan provided today and declined offer to receive copy of patient instructions, educational materials, and care plan.   Telephone follow up appointment with care management team member scheduled for: 08/02/20   Elliot Gurney, [place] Worker  [facility] [phone]

## 2020-07-19 NOTE — Chronic Care Management (AMB) (Signed)
Chronic Care Management    Clinical Social Work Note  07/19/2020 Name: Christina Rubio MRN: [id number] DOB: 1950-07-29  Christina Rubio is a 70 y.o. year old female who is a primary care patient of Bacigalupo, Dionne Bucy, MD. The CCM team was consulted to assist the patient with chronic disease management and/or care coordination needs related to: [employer] .   Engaged with patient by telephone for follow up visit in response to provider referral for social work chronic care management and care coordination services.   Consent to Services:  The patient was given information about Chronic Care Management services, agreed to services, and gave verbal consent prior to initiation of services.  Please see initial visit note for detailed documentation.   Patient agreed to services and consent obtained.   Assessment: Review of patient past medical history, allergies, medications, and health status, including review of relevant consultants reports was performed today as part of a comprehensive evaluation and provision of chronic care management and care coordination services.     SDOH (Social Determinants of Health) assessments and interventions performed:    Advanced Directives Status: Not addressed in this encounter.  CCM Care Plan  Allergies  Allergen Reactions  . Shellfish Allergy     Patient reported.  Marland Kitchen Epinephrine Palpitations    faint  . Penicillins Rash    Has patient had a PCN reaction causing immediate rash, facial/tongue/throat swelling, SOB or lightheadedness with hypotension:  Yes Has patient had a PCN reaction causing severe rash involving mucus membranes or skin necrosis: No Has patient had a PCN reaction that required hospitalization: No Has patient had a PCN reaction occurring within the last 10 years: No If all of the above answers are "NO", then may proceed with Cephalosporin use.;  . Strawberry Extract Rash    Outpatient Encounter Medications as of 07/19/2020   Medication Sig Note  . ALPRAZolam (XANAX) 0.5 MG tablet TAKE ONE TABLET BY MOUTH DAILY AS NEEDED FOR ANXIETY   . atorvastatin (LIPITOR) 20 MG tablet Take 1 tablet (20 mg total) by mouth daily.   . blood glucose meter kit and supplies Dispense based on patient and insurance preference. Use once daily to check for fasting blood sugars. (FOR ICD-10 E10.9, E11.9).   . famotidine (PEPCID) 20 MG tablet TAKE ONE TABLET BY MOUTH TWICE A DAY 04/24/2020: Reports usually taking once a day.  . levothyroxine (SYNTHROID) 100 MCG tablet Take 1 tablet (100 mcg total) by mouth daily.   Marland Kitchen loperamide (IMODIUM A-D) 2 MG tablet Take 2 mg by mouth 4 (four) times daily as needed for diarrhea or loose stools.   . metFORMIN (GLUCOPHAGE) 500 MG tablet Take 1 tablet (500 mg total) by mouth 2 (two) times daily with a meal.   . metoprolol succinate (TOPROL-XL) 50 MG 24 hr tablet Take 1 tablet (50 mg total) by mouth daily. Take with or immediately following a meal.   . Multiple Vitamins-Minerals (PRESERVISION AREDS 2) CAPS Take 1 tablet by mouth 2 (two) times daily.   . ondansetron (ZOFRAN ODT) 4 MG disintegrating tablet Take 1 tablet (4 mg total) by mouth every 8 (eight) hours as needed.   . Probiotic Product (PROBIOTIC PO) Take by mouth daily.    . sertraline (ZOLOFT) 100 MG tablet TAKE ONE AND ONE-HALF (1 & 1/2) TABLET BY MOUTH DAILY   . triamcinolone ointment (KENALOG) 0.5 % Apply 1 application topically 2 (two) times daily.   . valsartan-hydrochlorothiazide (DIOVAN-HCT) 160-25 MG tablet TAKE ONE TABLET BY MOUTH  AT BEDTIME    No facility-administered encounter medications on file as of 07/19/2020.    Patient Active Problem List   Diagnosis Date Noted  . Lymphedema 05/23/2020  . Venous stasis ulcer of right calf limited to breakdown of skin ([place]) 05/23/2020  . Bilateral lower extremity edema 01/16/2020  . Candidal intertrigo 04/28/2019  . Esophageal dysphagia 01/13/2019  . Toenail deformity 01/13/2019  . Chest pain  09/22/2018  . Bradycardia 09/22/2018  . GAD (generalized anxiety disorder) 05/24/2018  . T2DM (type 2 diabetes mellitus) ([place]) 05/24/2018  . Venous stasis 07/28/2017  . Spinal stenosis 05/28/2017  . DJD (degenerative joint disease) 05/28/2017  . Hypertension associated with diabetes ([place]) 02/10/2016  . Hyperlipidemia associated with type 2 diabetes mellitus ([place]) 02/10/2016  . MDD (major depressive disorder) 02/10/2016  . Hypothyroidism due to acquired atrophy of thyroid 02/10/2016  . Morbid obesity ([place]) 01/09/2016    Conditions to be addressed/monitored: Depression; Transportation and Mental Health Concerns   Care Plan : General Social Work (Adult)  Updates made by Vern Claude, LCSW since 07/19/2020 12:00 AM    Problem: Mobility and Independence     Goal: Mobility and Independence Optimized   Start Date: 04/19/2020  Expected End Date: 09/17/2020  This Visit's Progress: On track  Recent Progress: On track  Priority: High  Note:   Current Barriers:  . Transportation and ADL IADL limitations  Clinical Social Work Clinical Goal(s):  Marland Kitchen Over the next 90 days, patient will work with the community resources identified to address needs related to modifications needed to increase safe mobility in her home  Interventions: . 1:1 collaboration with Brita Romp, Dionne Bucy, MD regarding development and update of comprehensive plan of care as evidenced by provider attestation and co-signature . Inter-disciplinary care team collaboration (see longitudinal plan of care) . Follow up phone call to patient regarding mental health follow up and transportation needs . Patient confirmed that she has successfully worked with [facility] a ride to coordinate transport to her medical appointments, however they are unable to provide transportation with short notice-discussed alternatives such as family friend, possibly [facility] transportation . Patient continues to discuss contact with Marylee Floras with the  wheelchair ramp ministry regarding the grab bars in her bathroom and garage. She has decided to decline this referral stating that she now working on getting a walk-in shower which she feels will be much more beneficial. She further confirms obtaining a new toilet seat . Patient has a follow up appointment with the [place] appointment scheduled for 6/28 . Emotional support and positive reinforcement continue to be provided in regards to family member illness . Positive coping /self care emphasized . Discussed plans with patient for ongoing care management follow up and provided patient with direct contact information for care management team   Patient Goal for self-Care Activities Over the next 90 days, patient will:   - Patient will self administer medications as prescribed Patient will attend all scheduled provider appointments - begin a notebook of services in my neighborhood or community - follow-up on any referrals for help I am given - think ahead to make sure my need does not become an emergency - have a back-up plan  Follow up Plan: SW will follow up with patient by phone over the next 7-14 business days       Follow Up Plan: SW will follow up with patient by phone over the next 14 business days       [place], LCSW Clinical  Social Worker  [facility] [phone]

## 2020-07-20 ENCOUNTER — Ambulatory Visit: Payer: Self-pay

## 2020-07-20 DIAGNOSIS — Z9181 History of falling: Secondary | ICD-10-CM

## 2020-07-20 DIAGNOSIS — I152 Hypertension secondary to endocrine disorders: Secondary | ICD-10-CM | POA: Diagnosis not present

## 2020-07-20 DIAGNOSIS — E1169 Type 2 diabetes mellitus with other specified complication: Secondary | ICD-10-CM | POA: Diagnosis not present

## 2020-07-20 DIAGNOSIS — E1159 Type 2 diabetes mellitus with other circulatory complications: Secondary | ICD-10-CM | POA: Diagnosis not present

## 2020-07-20 DIAGNOSIS — F329 Major depressive disorder, single episode, unspecified: Secondary | ICD-10-CM | POA: Diagnosis not present

## 2020-07-24 ENCOUNTER — Telehealth: Payer: Self-pay | Admitting: Family Medicine

## 2020-07-24 ENCOUNTER — Telehealth: Payer: Self-pay

## 2020-07-24 NOTE — Telephone Encounter (Signed)
Spoke with patient on the phone who states that she does not wish to switch medications. Patient states that she is suppose to take Metformin twice a day but states that she forgets often, patient states that when she does take medication twice in a day she has diarrhea, patient states that when she takes medicine only once a day she has no episodes of diarrhea. Patient states that she feels that blood sugar is more controlled on Metformin and states that blood sugar readings fasting have been staying between 92-150. Patient would like to know can she decrease to one tab a day? KW

## 2020-07-24 NOTE — Chronic Care Management (AMB) (Signed)
Chronic Care Management   Follow Up Note  Name: Christina Rubio MRN: [id number] DOB: September 09, 1950   Primary Care Provider: Erasmo Downer, MD Reason for referral : Chronic Care Management   Christina Rubio is a 69 y.o. year old female who is a primary care patient of Beryle Flock, Marzella Schlein, MD. She is currently enrolled in the Chronic Care Management program. A routine telephonic outreach was conducted today.  Review of Christina Rubio status, including review of consultants reports, relevant labs and test results was conducted today. Collaboration with appropriate care team members was performed as part of the comprehensive evaluation and provision of chronic care management services.    SDOH (Social Determinants of Health) assessments performed: No     Outpatient Encounter Medications as of 07/20/2020  Medication Sig Note  . ALPRAZolam (XANAX) 0.5 MG tablet TAKE ONE TABLET BY MOUTH DAILY AS NEEDED FOR ANXIETY   . atorvastatin (LIPITOR) 20 MG tablet Take 1 tablet (20 mg total) by mouth daily.   . blood glucose meter kit and supplies Dispense based on patient and insurance preference. Use once daily to check for fasting blood sugars. (FOR ICD-10 E10.9, E11.9).   . famotidine (PEPCID) 20 MG tablet TAKE ONE TABLET BY MOUTH TWICE A DAY 04/24/2020: Reports usually taking once a day.  . levothyroxine (SYNTHROID) 100 MCG tablet Take 1 tablet (100 mcg total) by mouth daily.   Marland Kitchen loperamide (IMODIUM A-D) 2 MG tablet Take 2 mg by mouth 4 (four) times daily as needed for diarrhea or loose stools.   . metFORMIN (GLUCOPHAGE) 500 MG tablet Take 1 tablet (500 mg total) by mouth 2 (two) times daily with a meal.   . metoprolol succinate (TOPROL-XL) 50 MG 24 hr tablet Take 1 tablet (50 mg total) by mouth daily. Take with or immediately following a meal.   . Multiple Vitamins-Minerals (PRESERVISION AREDS 2) CAPS Take 1 tablet by mouth 2 (two) times daily.   . ondansetron (ZOFRAN ODT) 4 MG disintegrating  tablet Take 1 tablet (4 mg total) by mouth every 8 (eight) hours as needed.   . Probiotic Product (PROBIOTIC PO) Take by mouth daily.    . sertraline (ZOLOFT) 100 MG tablet TAKE ONE AND ONE-HALF (1 & 1/2) TABLET BY MOUTH DAILY   . triamcinolone ointment (KENALOG) 0.5 % Apply 1 application topically 2 (two) times daily.   . valsartan-hydrochlorothiazide (DIOVAN-HCT) 160-25 MG tablet TAKE ONE TABLET BY MOUTH AT BEDTIME    No facility-administered encounter medications on file as of 07/20/2020.       Objective:  Patient Care Plan: Fall Risk (Adult)  Problem Identified: Fall Risk     Long-Range Goal: Absence of Fall and Fall-Related Injury   Start Date: 07/20/20  Expected End Date: 10/18/20  Priority: High  Note: Goal Restarted  Current Barriers:  . Difficulty Ambulating r/t Spinal Stenosis and gait instability.  Clinical Goal(s):  Marland Kitchen Over the next 90 days, patient will not require hospitalization or emergent care d/t fall related injuries.   Interventions:  . Collaboration with Erasmo Downer, MD regarding development and update of comprehensive plan of care as evidenced by provider attestation and co-signature . Inter-disciplinary care team collaboration (see longitudinal plan of care) . Reviewed medications and discussed potential side effects of medications such as dizziness and frequent urination. . Reviewed safety and fall prevention  measures. Reports using assistive devices as recommended. She is currently receiving Home Health services for wound care to her left leg. [employer] intact. Reports using caution  as advised.  . Discussed possible in-home needs. She is currently receiving assistance with showers via the Home Health nursing aide. Reports arranging assistance once a week with housekeeping and tasks in the home. Agreed to keep the care management team updated of possible long-term needs.   Patient Goals/Self-Care Activities - Utilize assistive devices appropriately  with all ambulation - Ensure walking pathways are clear and well lit. - Change positions slowly - Wear secure fitting/skid free footwear when ambulating   Follow Up Plan:  Will follow up next month.   Patient Care Plan: Hypertension (Adult)    Problem Identified: Hypertension (Hypertension)     Long-Range Goal: Hypertension Monitored Completed 07/20/2020  Start Date: 04/24/2020  Expected End Date: 06/23/2020  Priority: High  Note:   Objective:    BP Readings from Last 3 Encounters:  06/12/20 132/71  05/22/20 (!) 147/73  01/16/20 128/68    Current Barriers:  . Chronic Disease Management support r/t hypertension management.  Case Manager Clinical Goal(s):  Marland Kitchen Over the next 120 days, patient will demonstrate improved adherence to prescribed treatment plan as evidenced by taking all medications as prescribed, monitoring and recording blood pressure as directed and adhering to a low sodium/DASH diet.  Interventions:  . Collaboration with Erasmo Downer, MD regarding development and update of comprehensive plan of care as evidenced by provider attestation and co-signature . Inter-disciplinary care team collaboration (see longitudinal plan of care) . Reviewed medications and compliance with current treatment plan. Reports taking medications as prescribed. Reports BP readings have been within range. Reviewed BP parameters and indications for notifying a provider.  . Reviewed s/sx of heart attack, stroke and indications for seeking immediate medical attention.  Patient Goals/Self-Care Activities -Self administer medications as prescribed -Attend all scheduled medical appointments -Monitor and record BP readings. -Follow recommended diet. -Call provider office or notify the care management team with new questions/concerns   Goal Met   Patient Care Plan: Diabetes Type 2 (Adult)    Problem Identified: Disease Progression (Diabetes, Type 2)     Long-Range Goal: Disease  Progression Prevented or Minimized   Start Date: 07/20/20  Expected End Date: 10/18/20  Note: Goal Restarted  Objective:   Lab Results  Component Value Date   HGBA1C 7.0 (H) 05/22/2020    Current Barriers:  . Chronic disease management support and educational needs related to Diabetes self-management   Clinical Goal(s):  Marland Kitchen Over the next 90 days, patient will demonstrate improved adherence to prescribed treatment plan for Diabetes management as evidenced by taking medications as prescribed, monitoring and recording CBGs, and adherence to an ADA/ carb modified diet.   Interventions:  . Collaboration with Erasmo Downer, MD regarding development and update of comprehensive plan of care as evidenced by provider attestation and co-signature . Inter-disciplinary care team collaboration (see longitudinal plan of care) . Reviewed medications. Reports taking as prescribed. Expressed concerns regarding side effects when taking metformin twice a day. Reports experiencing diarrhea. She is interested in switching to a slow release tablet. Reports previously discussing this with the CCM Pharmacist. Will follow up to determine if this is still an option. . Reviewed fasting blood glucose readings. Reports readings have ranged from the low 90's to 130's. Reading was 120 mg/dl today. Reviewed s/sx of hypoglycemia and hyperglycemia and appropriate interventions. Attempting to comply with a diabetic diet. Declined need for additional resources.   Patient Goals/Self-Care Activities - Self administer medications as prescribed - Attend all scheduled provider appointments - Monitor blood glucose levels  Chronic Care Management   Follow Up Note  Name: Christina Rubio MRN: [id number] DOB: 03/20/1951   Primary Care Provider: Bacigalupo, Angela M, MD Reason for referral : Chronic Care Management   Christina Rubio is a 69 y.o. year old female who is a primary care patient of Bacigalupo, Angela M, MD. She is currently enrolled in the Chronic Care Management program. A routine telephonic outreach was conducted today.  Review of Christina Rubio's status, including review of consultants reports, relevant labs and test results was conducted today. Collaboration with appropriate care team members was performed as part of the comprehensive evaluation and provision of chronic care management services.    SDOH (Social Determinants of Health) assessments performed: No     Outpatient Encounter Medications as of 07/20/2020  Medication Sig Note  . ALPRAZolam (XANAX) 0.5 MG tablet TAKE ONE TABLET BY MOUTH DAILY AS NEEDED FOR ANXIETY   . atorvastatin (LIPITOR) 20 MG tablet Take 1 tablet (20 mg total) by mouth daily.   . blood glucose meter kit and supplies Dispense based on patient and insurance preference. Use once daily to check for fasting blood sugars. (FOR ICD-10 E10.9, E11.9).   . famotidine (PEPCID) 20 MG tablet TAKE ONE TABLET BY MOUTH TWICE A DAY 04/24/2020: Reports usually taking once a day.  . levothyroxine (SYNTHROID) 100 MCG tablet Take 1 tablet (100 mcg total) by mouth daily.   . loperamide (IMODIUM A-D) 2 MG tablet Take 2 mg by mouth 4 (four) times daily as needed for diarrhea or loose stools.   . metFORMIN (GLUCOPHAGE) 500 MG tablet Take 1 tablet (500 mg total) by mouth 2 (two) times daily with a meal.   . metoprolol succinate (TOPROL-XL) 50 MG 24 hr tablet Take 1 tablet (50 mg total) by mouth daily. Take with or immediately following a meal.   . Multiple Vitamins-Minerals (PRESERVISION AREDS 2) CAPS Take 1 tablet by mouth 2 (two) times daily.   . ondansetron (ZOFRAN ODT) 4 MG disintegrating  tablet Take 1 tablet (4 mg total) by mouth every 8 (eight) hours as needed.   . Probiotic Product (PROBIOTIC PO) Take by mouth daily.    . sertraline (ZOLOFT) 100 MG tablet TAKE ONE AND ONE-HALF (1 & 1/2) TABLET BY MOUTH DAILY   . triamcinolone ointment (KENALOG) 0.5 % Apply 1 application topically 2 (two) times daily.   . valsartan-hydrochlorothiazide (DIOVAN-HCT) 160-25 MG tablet TAKE ONE TABLET BY MOUTH AT BEDTIME    No facility-administered encounter medications on file as of 07/20/2020.       Objective:  Patient Care Plan: Fall Risk (Adult)  Problem Identified: Fall Risk     Long-Range Goal: Absence of Fall and Fall-Related Injury   Start Date: 07/20/20  Expected End Date: 10/18/20  Priority: High  Note: Goal Restarted  Current Barriers:  . Difficulty Ambulating r/t Spinal Stenosis and gait instability.  Clinical Goal(s):  . Over the next 90 days, patient will not require hospitalization or emergent care d/t fall related injuries.   Interventions:  . Collaboration with Bacigalupo, Angela M, MD regarding development and update of comprehensive plan of care as evidenced by provider attestation and co-signature . Inter-disciplinary care team collaboration (see longitudinal plan of care) . Reviewed medications and discussed potential side effects of medications such as dizziness and frequent urination. . Reviewed safety and fall prevention  measures. Reports using assistive devices as recommended. She is currently receiving Home Health services for wound care to her left leg. [employer] intact. Reports using caution

## 2020-07-24 NOTE — Telephone Encounter (Signed)
Yes.  Okay to do metformin 1 tab per day

## 2020-07-24 NOTE — Telephone Encounter (Signed)
Can stop metformin (add to allergy list for diarrhea) and start Jardiance 10mg  daily (#30 r3). We can check in at next visit.

## 2020-07-24 NOTE — Telephone Encounter (Signed)
OK for verbals 

## 2020-07-24 NOTE — Telephone Encounter (Signed)
Verbal orders have been given. KW

## 2020-07-24 NOTE — Telephone Encounter (Signed)
Copied from [place] [id number]. Topic: General - Inquiry >> Jul 24, 2020 12:21 PM Greggory Keen D wrote: Reason for CRM: Pt called saying the metformin is causing her to have a lot of diarrhea. She said her numbers ar doing good now and wants to know whether she can change the metformin up or go off of it for  awhile.  If the rx is changed she goes to [facility]   CB#  [phone]

## 2020-07-24 NOTE — Telephone Encounter (Signed)
Home Health Verbal Orders - Caller/Agency: [facility] Callback Number: [phone] opt 2 secure  Requesting OT/PT/Skilled Nursing/Social Work/Speech Therapy: OT Frequency: 2x for 3wks

## 2020-07-24 NOTE — Telephone Encounter (Signed)
Patient advised.KW 

## 2020-07-30 NOTE — Patient Instructions (Signed)
Thank you for allowing the Chronic Care Management team to participate in your care.  It was a pleasure speaking with you. Please feel free to contact me with questions.   Goals Addressed: Patient Care Plan: Fall Risk (Adult)  Problem Identified: Fall Risk     Long-Range Goal: Absence of Fall and Fall-Related Injury   Start Date: 07/20/20  Expected End Date: 10/18/20  Priority: High  Note: Goal Restarted  Current Barriers:  . Difficulty Ambulating r/t Spinal Stenosis and gait instability.  Clinical Goal(s):  Marland Kitchen Over the next 90 days, patient will not require hospitalization or emergent care d/t fall related injuries.   Interventions:  . Collaboration with [name], MD regarding development and update of comprehensive plan of care as evidenced by provider attestation and co-signature . Inter-disciplinary care team collaboration (see longitudinal plan of care) . Reviewed medications and discussed potential side effects of medications such as dizziness and frequent urination. . Reviewed safety and fall prevention  measures. Reports using assistive devices as recommended. She is currently receiving Home Health services for wound care to her left leg. [employer] intact. Reports using caution as advised.  . Discussed possible in-home needs. She is currently receiving assistance with showers via the Home Health nursing aide. Reports arranging assistance once a week with housekeeping and tasks in the home. Agreed to keep the care management team updated of possible long-term needs.   Patient Goals/Self-Care Activities - Utilize assistive devices appropriately with all ambulation - Ensure walking pathways are clear and well lit. - Change positions slowly - Wear secure fitting/skid free footwear when ambulating   Follow Up Plan:  Will follow up next month.   Patient Care Plan: Hypertension (Adult)    Problem Identified: Hypertension (Hypertension)     Long-Range Goal:  Hypertension Monitored Completed 07/20/2020  Start Date: 04/24/2020  Expected End Date: 06/23/2020  Priority: High  Note:   Objective:    BP Readings from Last 3 Encounters:  06/12/20 132/71  05/22/20 (!) 147/73  01/16/20 128/68    Current Barriers:  . Chronic Disease Management support r/t hypertension management.  Case Manager Clinical Goal(s):  Marland Kitchen Over the next 120 days, patient will demonstrate improved adherence to prescribed treatment plan as evidenced by taking all medications as prescribed, monitoring and recording blood pressure as directed and adhering to a low sodium/DASH diet.  Interventions:  . Collaboration with [name], MD regarding development and update of comprehensive plan of care as evidenced by provider attestation and co-signature . Inter-disciplinary care team collaboration (see longitudinal plan of care) . Reviewed medications and compliance with current treatment plan. Reports taking medications as prescribed. Reports BP readings have been within range. Reviewed BP parameters and indications for notifying a provider.  . Reviewed s/sx of heart attack, stroke and indications for seeking immediate medical attention.  Patient Goals/Self-Care Activities -Self administer medications as prescribed -Attend all scheduled medical appointments -Monitor and record BP readings. -Follow recommended diet. -Call provider office or notify the care management team with new questions/concerns   Goal Met   Patient Care Plan: Diabetes Type 2 (Adult)    Problem Identified: Disease Progression (Diabetes, Type 2)     Long-Range Goal: Disease Progression Prevented or Minimized   Start Date: 07/20/20  Expected End Date: 10/18/20  Note: Goal Restarted  Objective:   Lab Results  Component Value Date   HGBA1C 7.0 (H) 05/22/2020    Current Barriers:  . Chronic disease management support and educational needs related to  Diabetes self-management   Clinical Goal(s):   Marland Kitchen Over the next 90 days, patient will demonstrate improved adherence to prescribed treatment plan for Diabetes management as evidenced by taking medications as prescribed, monitoring and recording CBGs, and adherence to an ADA/ carb modified diet.   Interventions:  . Collaboration with [name], MD regarding development and update of comprehensive plan of care as evidenced by provider attestation and co-signature . Inter-disciplinary care team collaboration (see longitudinal plan of care) . Reviewed medications. Reports taking as prescribed. Expressed concerns regarding side effects when taking metformin twice a day. Reports experiencing diarrhea. She is interested in switching to a slow release tablet. Reports previously discussing this with the CCM Pharmacist. Will follow up to determine if this is still an option. . Reviewed fasting blood glucose readings. Reports readings have ranged from the low 90's to 130's. Reading was 120 mg/dl today. Reviewed s/sx of hypoglycemia and hyperglycemia and appropriate interventions. Attempting to comply with a diabetic diet. Declined need for additional resources.   Patient Goals/Self-Care Activities - Self administer medications as prescribed - Attend all scheduled provider appointments - Monitor blood glucose levels consistently and utilize recommended interventions - Adhere to recommended diet. - Notify provider or care management team with health related questions and concerns as needed   Follow Up Plan:  Will follow up next month.          Ms. Argueta verbalized understanding of the information discussed during the telephonic outreach today. Declined need for mailed/printed instructions. A member of the care management team will follow up next month.    [name] Care Management [facility] [phone]

## 2020-07-31 ENCOUNTER — Telehealth (INDEPENDENT_AMBULATORY_CARE_PROVIDER_SITE_OTHER): Payer: Self-pay | Admitting: Nurse Practitioner

## 2020-07-31 NOTE — Telephone Encounter (Signed)
Patient states someone came out to measure legs on April 7th so she can get a leg pump?  Patient states that has been 4 weeks ago and haven't heard back.  Patient would like an update.

## 2020-07-31 NOTE — Telephone Encounter (Signed)
Patient has been giving [employer] number to contact for more information

## 2020-08-02 ENCOUNTER — Telehealth: Payer: Self-pay

## 2020-08-03 ENCOUNTER — Ambulatory Visit: Payer: Self-pay

## 2020-08-03 ENCOUNTER — Telehealth: Payer: Self-pay

## 2020-08-03 DIAGNOSIS — E1169 Type 2 diabetes mellitus with other specified complication: Secondary | ICD-10-CM

## 2020-08-03 MED ORDER — LOPERAMIDE HCL 2 MG PO TABS: 2.0000 mg | ORAL_TABLET | Freq: Four times a day (QID) | ORAL | 1 refills | Status: AC | PRN

## 2020-08-03 NOTE — Telephone Encounter (Signed)
-----   Message from [name], MD sent at 08/03/2020 11:55 AM EDT ----- Regarding: FW: Request for loperamide Ok to send in loperamide 4mg  BID prn #60 r1 ----- Message ----- From: Neldon Labella, RN Sent: 08/03/2020  11:53 AM EDT To: [name], MD Subject: Request for loperamide                         Good Morning Dr. Jacinto Reap!! I spoke with Ms. Spare today. She wanted to let you know that her symptoms have significantly improved since decreasing metformin to once a day but she is still experiencing episodes of diarrhea. She is requesting that an order for loperamide be faxed to [facility]. She has been attempting to purchase it over the counter but states the price has significantly increased.  Thank You!!

## 2020-08-03 NOTE — Chronic Care Management (AMB) (Signed)
  Chronic Care Management   Note  08/03/2020 Name: Christina Rubio MRN: [id number] DOB: 01-21-1951    Brief outreach with [name] regarding medications. She recently requested to decrease Metformin to once a day due to severe diarrhea. Reports symptoms have improved but she continues to experience episodes and requires an anti-diarrheal. Requesting a prescription for loperamide.     PLAN Medication request forwarded to Dr. Brita Rubio.    Christina Rubio [facility] Care Management [facility] [phone]

## 2020-08-07 ENCOUNTER — Ambulatory Visit (INDEPENDENT_AMBULATORY_CARE_PROVIDER_SITE_OTHER): Payer: BLUE CROSS/BLUE SHIELD | Admitting: Nurse Practitioner

## 2020-08-07 ENCOUNTER — Telehealth: Payer: Self-pay | Admitting: *Deleted

## 2020-08-07 NOTE — Telephone Encounter (Signed)
  Care Management   Follow Up Note   08/07/2020 Name: Estefanie Cornforth MRN: [id number] DOB: 12-Sep-1950   Referred by: [name], MD Reason for referral : Chronic Care Management   An unsuccessful telephone outreach was attempted today. The patient was referred to the case management team for assistance with care management and care coordination.   Follow Up Plan: Telephone follow up appointment with care management team member scheduled for: 08/14/20   Elliot Gurney, [place] Worker  [facility] Care Management [phone]

## 2020-08-08 DIAGNOSIS — I890 Lymphedema, not elsewhere classified: Secondary | ICD-10-CM | POA: Diagnosis not present

## 2020-08-09 ENCOUNTER — Other Ambulatory Visit: Payer: Self-pay

## 2020-08-09 ENCOUNTER — Encounter (INDEPENDENT_AMBULATORY_CARE_PROVIDER_SITE_OTHER): Payer: Self-pay | Admitting: Nurse Practitioner

## 2020-08-09 ENCOUNTER — Ambulatory Visit (INDEPENDENT_AMBULATORY_CARE_PROVIDER_SITE_OTHER): Payer: Medicare Other | Admitting: Nurse Practitioner

## 2020-08-09 VITALS — BP 129/62 | HR 51 | Ht 64.0 in | Wt >= 6400 oz

## 2020-08-09 DIAGNOSIS — I83012 Varicose veins of right lower extremity with ulcer of calf: Secondary | ICD-10-CM

## 2020-08-09 DIAGNOSIS — L97211 Non-pressure chronic ulcer of right calf limited to breakdown of skin: Secondary | ICD-10-CM | POA: Diagnosis not present

## 2020-08-09 DIAGNOSIS — I890 Lymphedema, not elsewhere classified: Secondary | ICD-10-CM

## 2020-08-09 DIAGNOSIS — E1169 Type 2 diabetes mellitus with other specified complication: Secondary | ICD-10-CM | POA: Diagnosis not present

## 2020-08-11 ENCOUNTER — Encounter (INDEPENDENT_AMBULATORY_CARE_PROVIDER_SITE_OTHER): Payer: Self-pay | Admitting: Nurse Practitioner

## 2020-08-11 NOTE — Progress Notes (Signed)
Subjective:    Patient ID: Christina Rubio, female    DOB: December 30, 1950, 70 y.o.   MRN: [id number] Chief Complaint  Patient presents with  . Follow-up    4wk unna boot check    Christina Rubio is a 70 year old female that returns today for follow-up evaluation of her right lower extremity ulceration.  The patient has been in [place] wraps done by home health for the last several weeks.  She notes that the weeping that was coming from her calf has resolved.  There is a large scabbed over area on her calf.  The inner wraps have been helpful and she is tolerating them well.  The patient also elevates her lower extremities as much as possible.   Review of Systems  Cardiovascular: Positive for leg swelling.  Musculoskeletal: Positive for gait problem.  Neurological: Positive for weakness.  All other systems reviewed and are negative.      Objective:   Physical Exam Vitals reviewed.  Constitutional:      Appearance: She is obese.  HENT:     Head: Normocephalic.  Cardiovascular:     Rate and Rhythm: Normal rate.  Pulmonary:     Effort: Pulmonary effort is normal.  Musculoskeletal:     Right lower leg: 3+ Edema present.     Left lower leg: 3+ Edema present.  Skin:    Comments: Dermal thickening bilaterally  Neurological:     Mental Status: She is alert and oriented to person, place, and time.  Psychiatric:        Mood and Affect: Mood normal.        Behavior: Behavior normal.        Thought Content: Thought content normal.        Judgment: Judgment normal.     BP 129/62   Pulse (!) 51   Ht $R'5\' 4"'OR$  (1.626 m)   Wt (!) 400 lb (181.4 kg)   BMI 68.66 kg/m   Past Medical History:  Diagnosis Date  . Anxiety   . Arthritis   . Depression   . Hyperlipidemia   . Hypertension   . Neuromuscular disorder ([place])   . Osteoporosis   . PONV (postoperative nausea and vomiting)   . Skin cancer    non melanoma  . Spinal stenosis   . Thyroid disease    hyperthyroid s/p partial  thyroidectomy    Social History   Socioeconomic History  . Marital status: Widowed    Spouse name: Not on file  . Number of children: 1  . Years of education: 2 master's degrees  . Highest education level: Master's degree (e.g., MA, MS, MEng, MEd, MSW, MBA)  Occupational History  . Occupation: retired    Comment: Proofreader  Tobacco Use  . Smoking status: Never Smoker  . Smokeless tobacco: Never Used  Vaping Use  . Vaping Use: Never used  Substance and Sexual Activity  . Alcohol use: Yes    Comment: 1-2 drinks per year  . Drug use: No  . Sexual activity: Never  Other Topics Concern  . Not on file  Social History Narrative  . Not on file   Social Determinants of Health   Financial Resource Strain: Not on file  Food Insecurity: No Food Insecurity  . Worried About Charity fundraiser in the Last Year: Never true  . Ran Out of Food in the Last Year: Never true  Transportation Needs: Unmet Transportation Needs  . Lack of Transportation (Medical): Yes  .  Lack of Transportation (Non-Medical): Yes  Physical Activity: Not on file  Stress: Stress Concern Present  . Feeling of Stress : Very much  Social Connections: Not on file  Intimate Partner Violence: Not on file    Past Surgical History:  Procedure Laterality Date  . CESAREAN SECTION    . CHOLECYSTECTOMY    . COLON SURGERY    . FEMUR IM NAIL Right 01/09/2016   Procedure: INTRAMEDULLARY (IM) RETROGRADE FEMORAL NAILING RIGHT DISTAL FEMUR;  Surgeon: Dorna Leitz, MD;  Location: [facility];  Service: Orthopedics;  Laterality: Right;  . LAPAROSCOPIC GASTRIC SLEEVE RESECTION    . Right thyroidectomy    . TONSILLECTOMY      Family History  Problem Relation Age of Onset  . Lung cancer Mother   . Prostate cancer Father   . Depression Daughter   . Anemia Daughter        [facility]  . Thyroid cancer Maternal Uncle   . Lung cancer Paternal Uncle   . Stroke Maternal Grandmother   . Stroke Maternal Grandfather   . Stroke  Paternal Grandmother   . Healthy Sister   . Healthy Brother   . Healthy Sister   . Emphysema Paternal Grandfather   . Colon cancer Neg Hx   . Breast cancer Neg Hx   . Ovarian cancer Neg Hx   . Cervical cancer Neg Hx     Allergies  Allergen Reactions  . Shellfish Allergy     Patient reported.  Marland Kitchen Epinephrine Palpitations    faint  . Penicillins Rash    Has patient had a PCN reaction causing immediate rash, facial/tongue/throat swelling, SOB or lightheadedness with hypotension:  Yes Has patient had a PCN reaction causing severe rash involving mucus membranes or skin necrosis: No Has patient had a PCN reaction that required hospitalization: No Has patient had a PCN reaction occurring within the last 10 years: No If all of the above answers are "NO", then may proceed with Cephalosporin use.;  . Strawberry Extract Rash    CBC Latest Ref Rng & Units 09/11/2018 05/28/2017 03/14/2016  WBC 4.0 - 10.5 K/uL 9.8 8.4 9.0  Hemoglobin 12.0 - 15.0 g/dL 13.3 14.0 13.0  Hematocrit 36.0 - 46.0 % 41.1 41.5 38.1  Platelets 150 - 400 K/uL 263 266 263      CMP     Component Value Date/Time   NA 140 05/22/2020 1644   K 4.1 05/22/2020 1644   CL 101 05/22/2020 1644   CO2 22 05/22/2020 1644   GLUCOSE 131 (H) 05/22/2020 1644   GLUCOSE 176 (H) 09/11/2018 2046   BUN 16 05/22/2020 1644   CREATININE 1.19 (H) 05/22/2020 1644   CALCIUM 9.4 05/22/2020 1644   PROT 7.3 05/22/2020 1644   ALBUMIN 4.3 05/22/2020 1644   AST 16 05/22/2020 1644   ALT 15 05/22/2020 1644   ALKPHOS 42 (L) 05/22/2020 1644   BILITOT 0.5 05/22/2020 1644   GFRNONAA 65 12/29/2019 1117   GFRAA 75 12/29/2019 1117     No results found.     Assessment & Plan:   1. Lymphedema Recommend:  No surgery or intervention at this point in time.    I have reviewed my previous discussion with the patient regarding swelling and why it causes symptoms.  Patient will continue wearing graduated compression stockings class 1 (20-30 mmHg)  on a daily basis. The patient will  beginning wearing the stockings first thing in the morning and removing them in the evening. The patient is  instructed specifically not to sleep in the stockings.    In addition, behavioral modification including several periods of elevation of the lower extremities during the day will be continued.  This was reviewed with the patient during the initial visit.  The patient will also continue routine exercise, especially walking on a daily basis as was discussed during the initial visit.    Despite conservative treatments of 4 weeks, including graduated compression therapy class 1 and behavioral modification including exercise and elevation the patient  has not obtained adequate control of the lymphedema.  The patient still has stage 3 lymphedema and therefore, I believe that a lymph pump should be added to improve the control of the patient's lymphedema.  Additionally, a lymph pump is warranted because it will reduce the risk of cellulitis and ulceration in the future.  Patient should follow-up in six months    2. Venous stasis ulcer of right calf limited to breakdown of skin, unspecified whether varicose veins present [facility]) This has resolved at this time.    3. Type 2 diabetes mellitus with other specified complication, without long-term current use of insulin (HCC) Continue hypoglycemic medications as already ordered, these medications have been reviewed and there are no changes at this time.  Hgb A1C to be monitored as already arranged by primary service    Current Outpatient Medications on File Prior to Visit  Medication Sig Dispense Refill  . ALPRAZolam (XANAX) 0.5 MG tablet TAKE ONE TABLET BY MOUTH DAILY AS NEEDED FOR ANXIETY 30 tablet 0  . atorvastatin (LIPITOR) 20 MG tablet Take 1 tablet (20 mg total) by mouth daily. 90 tablet 3  . blood glucose meter kit and supplies Dispense based on patient and insurance preference. Use once daily to check for  fasting blood sugars. (FOR ICD-10 E10.9, E11.9). 1 each 0  . famotidine (PEPCID) 20 MG tablet TAKE ONE TABLET BY MOUTH TWICE A DAY 60 tablet 3  . levothyroxine (SYNTHROID) 100 MCG tablet Take 1 tablet (100 mcg total) by mouth daily. 90 tablet 1  . loperamide (IMODIUM A-D) 2 MG tablet Take 1 tablet (2 mg total) by mouth 4 (four) times daily as needed for diarrhea or loose stools. 60 tablet 1  . metFORMIN (GLUCOPHAGE) 500 MG tablet Take 1 tablet (500 mg total) by mouth 2 (two) times daily with a meal. 180 tablet 3  . Multiple Vitamins-Minerals (PRESERVISION AREDS 2) CAPS Take 1 tablet by mouth 2 (two) times daily.    . ondansetron (ZOFRAN ODT) 4 MG disintegrating tablet Take 1 tablet (4 mg total) by mouth every 8 (eight) hours as needed. 20 tablet 0  . Probiotic Product (PROBIOTIC PO) Take by mouth daily.     . sertraline (ZOLOFT) 100 MG tablet TAKE ONE AND ONE-HALF (1 & 1/2) TABLET BY MOUTH DAILY 135 tablet 2  . triamcinolone ointment (KENALOG) 0.5 % Apply 1 application topically 2 (two) times daily. 45 g 1  . valsartan-hydrochlorothiazide (DIOVAN-HCT) 160-25 MG tablet TAKE ONE TABLET BY MOUTH AT BEDTIME 90 tablet 1  . metoprolol succinate (TOPROL-XL) 50 MG 24 hr tablet Take 1 tablet (50 mg total) by mouth daily. Take with or immediately following a meal. 90 tablet 3   No current facility-administered medications on file prior to visit.    There are no Patient Instructions on file for this visit. No follow-ups on file.   Kris Hartmann, NP

## 2020-08-13 ENCOUNTER — Other Ambulatory Visit: Payer: Self-pay | Admitting: Family Medicine

## 2020-08-13 DIAGNOSIS — E034 Atrophy of thyroid (acquired): Secondary | ICD-10-CM

## 2020-08-14 ENCOUNTER — Ambulatory Visit (INDEPENDENT_AMBULATORY_CARE_PROVIDER_SITE_OTHER): Payer: Medicare Other | Admitting: *Deleted

## 2020-08-14 DIAGNOSIS — F329 Major depressive disorder, single episode, unspecified: Secondary | ICD-10-CM

## 2020-08-14 DIAGNOSIS — E1169 Type 2 diabetes mellitus with other specified complication: Secondary | ICD-10-CM

## 2020-08-14 DIAGNOSIS — F411 Generalized anxiety disorder: Secondary | ICD-10-CM

## 2020-08-21 NOTE — Patient Instructions (Signed)
Visit Information  Goals Addressed            This Visit's Progress   . Find Help in My Community       Timeframe:  Long-Range Goal Priority:  High Start Date:  04/19/20                           Expected End Date:    09/17/20                   Follow Up Date 08/14/20   - begin a notebook of services in my neighborhood or community - continue to follow-up on any referrals for help I am given ([facility], [place]) - think ahead to make sure my need does not become an emergency - have a back-up plan to meet transportation needs   Why is this important?    Knowing how and where to find help for yourself or family in your neighborhood and community is an important skill.   You will want to take some steps to learn how.    Notes:        The patient verbalized understanding of instructions, educational materials, and care plan provided today and declined offer to receive copy of patient instructions, educational materials, and care plan.   No further follow up required: patient to contact this Education officer, museum with any additional community resource needs   [employer], [place] Worker  [facility] Care Management [phone]

## 2020-08-21 NOTE — Chronic Care Management (AMB) (Signed)
Chronic Care Management    Clinical Social Work Note  08/21/2020 Name: Christina Rubio MRN: [id number] DOB: 14-Mar-1951  Christina Rubio is a 70 y.o. year old female who is a primary care patient of Bacigalupo, Dionne Bucy, MD. The CCM team was consulted to assist the patient with chronic disease management and/or care coordination needs related to: [employer] .   Engaged with patient by telephone for follow up visit in response to provider referral for social work chronic care management and care coordination services.   Consent to Services:  The patient was given information about Chronic Care Management services, agreed to services, and gave verbal consent prior to initiation of services.  Please see initial visit note for detailed documentation.   Patient agreed to services and consent obtained.   Assessment: Review of patient past medical history, allergies, medications, and health status, including review of relevant consultants reports was performed today as part of a comprehensive evaluation and provision of chronic care management and care coordination services.     SDOH (Social Determinants of Health) assessments and interventions performed:    Advanced Directives Status: Not addressed in this encounter.  CCM Care Plan  Allergies  Allergen Reactions  . Shellfish Allergy     Patient reported.  Marland Kitchen Epinephrine Palpitations    faint  . Penicillins Rash    Has patient had a PCN reaction causing immediate rash, facial/tongue/throat swelling, SOB or lightheadedness with hypotension:  Yes Has patient had a PCN reaction causing severe rash involving mucus membranes or skin necrosis: No Has patient had a PCN reaction that required hospitalization: No Has patient had a PCN reaction occurring within the last 10 years: No If all of the above answers are "NO", then may proceed with Cephalosporin use.;  . Strawberry Extract Rash    Outpatient Encounter Medications as of 08/14/2020   Medication Sig Note  . ALPRAZolam (XANAX) 0.5 MG tablet TAKE ONE TABLET BY MOUTH DAILY AS NEEDED FOR ANXIETY   . atorvastatin (LIPITOR) 20 MG tablet Take 1 tablet (20 mg total) by mouth daily.   . blood glucose meter kit and supplies Dispense based on patient and insurance preference. Use once daily to check for fasting blood sugars. (FOR ICD-10 E10.9, E11.9).   . famotidine (PEPCID) 20 MG tablet TAKE ONE TABLET BY MOUTH TWICE A DAY 04/24/2020: Reports usually taking once a day.  . levothyroxine (SYNTHROID) 100 MCG tablet Take 1 tablet (100 mcg total) by mouth daily.   Marland Kitchen loperamide (IMODIUM A-D) 2 MG tablet Take 1 tablet (2 mg total) by mouth 4 (four) times daily as needed for diarrhea or loose stools.   . metFORMIN (GLUCOPHAGE) 500 MG tablet Take 1 tablet (500 mg total) by mouth 2 (two) times daily with a meal.   . metoprolol succinate (TOPROL-XL) 50 MG 24 hr tablet Take 1 tablet (50 mg total) by mouth daily. Take with or immediately following a meal.   . Multiple Vitamins-Minerals (PRESERVISION AREDS 2) CAPS Take 1 tablet by mouth 2 (two) times daily.   . ondansetron (ZOFRAN ODT) 4 MG disintegrating tablet Take 1 tablet (4 mg total) by mouth every 8 (eight) hours as needed.   . Probiotic Product (PROBIOTIC PO) Take by mouth daily.    . sertraline (ZOLOFT) 100 MG tablet TAKE ONE AND ONE-HALF (1 & 1/2) TABLET BY MOUTH DAILY   . triamcinolone ointment (KENALOG) 0.5 % APPLY TO AFFECTED AREAS TOPICALLY TWICE DAILY   . valsartan-hydrochlorothiazide (DIOVAN-HCT) 160-25 MG tablet TAKE ONE TABLET  BY MOUTH AT BEDTIME    No facility-administered encounter medications on file as of 08/14/2020.    Patient Active Problem List   Diagnosis Date Noted  . Lymphedema 05/23/2020  . Venous stasis ulcer of right calf limited to breakdown of skin ([place]) 05/23/2020  . Bilateral lower extremity edema 01/16/2020  . Candidal intertrigo 04/28/2019  . Esophageal dysphagia 01/13/2019  . Toenail deformity 01/13/2019  .  Chest pain 09/22/2018  . Bradycardia 09/22/2018  . GAD (generalized anxiety disorder) 05/24/2018  . T2DM (type 2 diabetes mellitus) ([place]) 05/24/2018  . Venous stasis 07/28/2017  . Spinal stenosis 05/28/2017  . DJD (degenerative joint disease) 05/28/2017  . Hypertension associated with diabetes ([place]) 02/10/2016  . Hyperlipidemia associated with type 2 diabetes mellitus ([place]) 02/10/2016  . MDD (major depressive disorder) 02/10/2016  . Hypothyroidism due to acquired atrophy of thyroid 02/10/2016  . Morbid obesity ([place]) 01/09/2016    Conditions to be addressed/monitored: Depression; Transportation  Care Plan : General Social Work (Adult)  Updates made by Vern Claude, LCSW since 08/21/2020 12:00 AM    Problem: Mobility and Independence     Goal: Mobility and Independence Optimized   Start Date: 04/19/2020  Expected End Date: 09/17/2020  Recent Progress: On track  Priority: High  Note:   Current Barriers:  . Transportation and ADL IADL limitations  Clinical Social Work Clinical Goal(s):  Marland Kitchen Over the next 90 days, patient will work with the community resources identified to address needs related to modifications needed to increase safe mobility in her home  Interventions: . 1:1 collaboration with Brita Romp, Dionne Bucy, MD regarding development and update of comprehensive plan of care as evidenced by provider attestation and co-signature . Inter-disciplinary care team collaboration (see longitudinal plan of care) . Follow up phone call to patient regarding mental health follow up and transportation needs . Patient confirmed that she has successfully worked with [place] a ride to coordinate transport to her medical appointments, however they are unable to provide transportation with short notice-continued to reinforce alternatives such as family friend, possibly [facility] transportation -patient reminded to call if [facility] transportation is needed . Patient continues to discuss contact with Marylee Floras with the wheelchair ramp ministry regarding the grab bars in her bathroom and garage. She has decided to decline this referral as well as walk-in shower at this time She further confirms obtaining a new toilet seat . Patient has a follow up appointment with the [place] appointment scheduled for 6/28 . Patient confirmed that [facility] is no longer coming to her home to provide wound care-issue has resolved . Daughter confirmed to be doing much better medically . Emotional support and positive reinforcement continue to be provided in regards to family member illness . Positive coping /self care emphasized . Patient verbalized having no additional community resource needs at this time  . Discussed plans with patient for ongoing care management follow up and provided patient with direct contact information for care management team if needed in the futrure   Patient Goal for self-Care Activities Over the next 90 days, patient will:   - Patient will self administer medications as prescribed Patient will attend all scheduled provider appointments - begin a notebook of services in my neighborhood or community - follow-up on any referrals for help I am given - think ahead to make sure my need does not become an emergency - have a back-up plan  Follow up Plan: SW will follow up with patient by phone over the  next 7-14 business days       Follow Up Plan: Client will contact this social worker with any additional community resource needs       [place], [place] Worker  [facility] Care Management [phone]

## 2020-08-27 ENCOUNTER — Other Ambulatory Visit: Payer: Self-pay | Admitting: Family Medicine

## 2020-08-27 ENCOUNTER — Telehealth: Payer: Self-pay

## 2020-08-27 DIAGNOSIS — F329 Major depressive disorder, single episode, unspecified: Secondary | ICD-10-CM

## 2020-08-27 DIAGNOSIS — F411 Generalized anxiety disorder: Secondary | ICD-10-CM

## 2020-08-27 NOTE — Telephone Encounter (Signed)
Medication Refill - Medication: xanax 50  mg  Has the patient contacted their pharmacy? Yes.  they told her to call the office (Agent: If no, request that the patient contact the pharmacy for the refill.) (Agent: If yes, when and what did the pharmacy advise?)  Preferred Pharmacy (with phone number or street name): [facility]  pharmacy Agent: Please be advised that RX refills may take up to 3 business days. We ask that you follow-up with your pharmacy.

## 2020-08-27 NOTE — Telephone Encounter (Signed)
Copied from [place] [id number]. Topic: General - Other >> Aug 27, 2020  4:57 PM Wynetta Emery, [place] C wrote: Reason for CRM: pt called in for assistance. Pt says that she is needing a new referral to a psychiatrist. Pt says that she had one previously but he has since moved to [facility] which is to far for her to travel. Pt would like to state within the same practice if possible.    Please assist.

## 2020-08-28 ENCOUNTER — Ambulatory Visit: Payer: Self-pay | Admitting: Family Medicine

## 2020-08-28 MED ORDER — ALPRAZOLAM 0.5 MG PO TABS: ORAL_TABLET | ORAL | 0 refills | Status: DC

## 2020-08-28 NOTE — Telephone Encounter (Signed)
Okay with me?  I am not sure which practice it was.

## 2020-08-28 NOTE — Addendum Note (Signed)
Addended by: Wilburt Finlay on: 08/28/2020 03:18 PM   Modules accepted: Orders

## 2020-08-28 NOTE — Telephone Encounter (Signed)
Please advise 

## 2020-08-28 NOTE — Telephone Encounter (Signed)
Placed order per Dr. Rosanna Randy.

## 2020-09-17 ENCOUNTER — Other Ambulatory Visit: Payer: Self-pay | Admitting: Family Medicine

## 2020-09-28 ENCOUNTER — Other Ambulatory Visit: Payer: Self-pay | Admitting: Family Medicine

## 2020-09-28 NOTE — Telephone Encounter (Signed)
Requested medication (s) are due for refill today: yes  Requested medication (s) are on the active medication list: yes  Last refill:  08/28/2020  Future visit scheduled:  yes   Notes to clinic:  this refill cannot be delegated    Requested Prescriptions  Pending Prescriptions Disp Refills   ALPRAZolam (XANAX) 0.5 MG tablet [Pharmacy Med Name: ALPRAZOLAM 0.5 MG TAB] 30 tablet     Sig: TAKE ONE TABLET EVERY DAY AS NEEDED FOR ANXIETY      Not Delegated - Psychiatry:  Anxiolytics/Hypnotics Failed - 09/28/2020  9:42 AM      Failed - This refill cannot be delegated      Failed - Urine Drug Screen completed in last 360 days      Passed - Valid encounter within last 6 months    Recent Outpatient Visits           3 months ago Xerosis of skin   [facility] [place], Dionne Bucy, MD   4 months ago Type 2 diabetes mellitus with other specified complication, without long-term current use of insulin [facility])   [facility] [place], Dionne Bucy, MD   4 months ago Cellulitis of right lower extremity   [place], [place]   5 months ago Bilateral lower extremity edema   [facility] [place], Dionne Bucy, MD   8 months ago Essential hypertension, benign   [facility] [place], Dionne Bucy, MD       Future Appointments             In 1 week Bacigalupo, Dionne Bucy, MD [facility]

## 2020-10-01 DIAGNOSIS — I890 Lymphedema, not elsewhere classified: Secondary | ICD-10-CM | POA: Diagnosis not present

## 2020-10-05 ENCOUNTER — Other Ambulatory Visit: Payer: Self-pay | Admitting: Family Medicine

## 2020-10-05 MED ORDER — ALPRAZOLAM 0.5 MG PO TABS: ORAL_TABLET | ORAL | 1 refills | Status: DC

## 2020-10-05 NOTE — Telephone Encounter (Signed)
  Notes to clinic:  script filled on 09/28/2020 Refill cannot be refused from [facility]    Requested Prescriptions  Pending Prescriptions Disp Refills   ALPRAZolam (XANAX) 0.5 MG tablet [Pharmacy Med Name: ALPRAZOLAM 0.5 MG TAB] 30 tablet     Sig: TAKE ONE TABLET EVERY DAY AS NEEDED FOR ANXIETY      Not Delegated - Psychiatry:  Anxiolytics/Hypnotics Failed - 10/05/2020  8:50 AM      Failed - This refill cannot be delegated      Failed - Urine Drug Screen completed in last 360 days      Passed - Valid encounter within last 6 months    Recent Outpatient Visits           4 months ago Xerosis of skin   [facility] [place], Dionne Bucy, MD   4 months ago Type 2 diabetes mellitus with other specified complication, without long-term current use of insulin [facility])   [facility] [place], Dionne Bucy, MD   5 months ago Cellulitis of right lower extremity   [place], [place]   5 months ago Bilateral lower extremity edema   [facility] [place], Dionne Bucy, MD   8 months ago Essential hypertension, benign   [facility] [place], Dionne Bucy, MD       Future Appointments             In 6 days Bacigalupo, Dionne Bucy, MD [facility]

## 2020-10-05 NOTE — Telephone Encounter (Signed)
Copied from [place] [id number]. Topic: Quick Communication - Rx Refill/Question >> Oct 05, 2020  8:55 AM Tessa Lerner A wrote: Medication: ALPRAZolam Duanne Moron) 0.5 MG tablet   Has the patient contacted their pharmacy? Yes.   (Agent: If no, request that the patient contact the pharmacy for the refill.) (Agent: If yes, when and what did the pharmacy advise?)  Preferred Pharmacy (with phone number or street name): [place], [place]  Phone:  [phone] Fax:  [phone]  Agent: Please be advised that RX refills may take up to 3 business days. We ask that you follow-up with your pharmacy.

## 2020-10-10 ENCOUNTER — Telehealth: Payer: Self-pay

## 2020-10-10 NOTE — Telephone Encounter (Signed)
Copied from [place] [id number]. Topic: Appointment Scheduling - Scheduling Inquiry for Clinic >> Oct 10, 2020 11:51 AM Bayard Beaver wrote: Reason for CRM: patient would like to be called back if sooner appt becomes available then 10/0 3

## 2020-10-11 ENCOUNTER — Ambulatory Visit: Payer: Self-pay | Admitting: Family Medicine

## 2020-10-15 ENCOUNTER — Ambulatory Visit (INDEPENDENT_AMBULATORY_CARE_PROVIDER_SITE_OTHER): Payer: Medicare Other

## 2020-10-15 DIAGNOSIS — E1169 Type 2 diabetes mellitus with other specified complication: Secondary | ICD-10-CM

## 2020-10-15 NOTE — Chronic Care Management (AMB) (Signed)
  Chronic Care Management   CCM RN Visit Note  10/15/2020 Name: Christina Rubio MRN: [id number] DOB: Aug 16, 1950  Subjective: [name] is a 70 y.o. year old female who is a primary care patient of Bacigalupo, Dionne Bucy, MD. The care management team was consulted for assistance with disease management and care coordination needs.  [name] was busy at the time the call. Requested to complete outreach at a later time.  Engaged with patient by telephone for follow up visit in response to provider referral for case management and/or care coordination services.   Consent to Services:  The patient was given information about Chronic Care Management services, agreed to services, and gave verbal consent prior to initiation of services.  Please see initial visit note for detailed documentation.     PLAN: Anticipate outreach within the next 30 days.    [name] [phone]

## 2020-11-02 ENCOUNTER — Telehealth: Payer: Medicare Other | Admitting: Nurse Practitioner

## 2020-11-02 ENCOUNTER — Ambulatory Visit: Payer: Self-pay

## 2020-11-02 DIAGNOSIS — H1032 Unspecified acute conjunctivitis, left eye: Secondary | ICD-10-CM

## 2020-11-02 MED ORDER — GENTAMICIN SULFATE 0.3 % OP SOLN: 1.0000 [drp] | OPHTHALMIC | 0 refills | Status: DC

## 2020-11-02 NOTE — Progress Notes (Signed)
Virtual Visit Consent   Christina Rubio, you are scheduled for a virtual visit with a [facility] provider today.     Just as with appointments in the office, your consent must be obtained to participate.  Your consent will be active for this visit and any virtual visit you may have with one of our providers in the next 365 days.     If you have a MyChart account, a copy of this consent can be sent to you electronically.  All virtual visits are billed to your insurance company just like a traditional visit in the office.    As this is a virtual visit, video technology does not allow for your provider to perform a traditional examination.  This may limit your provider's ability to fully assess your condition.  If your provider identifies any concerns that need to be evaluated in person or the need to arrange testing (such as labs, EKG, etc.), we will make arrangements to do so.     Although advances in technology are sophisticated, we cannot ensure that it will always work on either your end or our end.  If the connection with a video visit is poor, the visit may have to be switched to a telephone visit.  With either a video or telephone visit, we are not always able to ensure that we have a secure connection.     I need to obtain your verbal consent now.   Are you willing to proceed with your visit today?    Christina Rubio has provided verbal consent on 11/02/2020 for a virtual visit (video or telephone).   Apolonio Schneiders, FNP   Date: 11/02/2020 3:05 PM   Virtual Visit via Video Note   I, Apolonio Schneiders, connected with  Christina Rubio  ([id number], 1951/01/21) on 11/02/20 at  3:15 PM EDT by a video-enabled telemedicine application and verified that I am speaking with the correct person using two identifiers.  Location: Patient: Virtual Visit Location Patient: Home Provider: Virtual Visit Location Provider: Office/Clinic   I discussed the limitations of evaluation and management by telemedicine  and the availability of in person appointments. The patient expressed understanding and agreed to proceed.    History of Present Illness: Christina Rubio is a 70 y.o. who identifies as a female who was assigned female at birth, and is being seen today with complaints of irritation to her left eye. Yesterday her left eye started to water and become irritated and today it is red. When she woke up this morning she had drainage and crusting.   Her grandson has been sick, they have continued to test him for COVID and he has been negative so far.   No other known sick contacts  Denies any other systemic symptoms    Problems:  Patient Active Problem List   Diagnosis Date Noted   Lymphedema 05/23/2020   Venous stasis ulcer of right calf limited to breakdown of skin ([place]) 05/23/2020   Bilateral lower extremity edema 01/16/2020   Candidal intertrigo 04/28/2019   Esophageal dysphagia 01/13/2019   Toenail deformity 01/13/2019   Chest pain 09/22/2018   Bradycardia 09/22/2018   GAD (generalized anxiety disorder) 05/24/2018   T2DM (type 2 diabetes mellitus) ([place]) 05/24/2018   Venous stasis 07/28/2017   Spinal stenosis 05/28/2017   DJD (degenerative joint disease) 05/28/2017   Hypertension associated with diabetes ([place]) 02/10/2016   Hyperlipidemia associated with type 2 diabetes mellitus ([place]) 02/10/2016   MDD (major depressive disorder) 02/10/2016   Hypothyroidism due  to acquired atrophy of thyroid 02/10/2016   Morbid obesity ([place]) 01/09/2016    Allergies:  Allergies  Allergen Reactions   Shellfish Allergy     Patient reported.   Epinephrine Palpitations    faint   Penicillins Rash    Has patient had a PCN reaction causing immediate rash, facial/tongue/throat swelling, SOB or lightheadedness with hypotension:  Yes Has patient had a PCN reaction causing severe rash involving mucus membranes or skin necrosis: No Has patient had a PCN reaction that required hospitalization: No Has patient  had a PCN reaction occurring within the last 10 years: No If all of the above answers are "NO", then may proceed with Cephalosporin use.;   Strawberry Extract Rash   Medications:  Current Outpatient Medications:    ALPRAZolam (XANAX) 0.5 MG tablet, TAKE ONE TABLET EVERY DAY AS NEEDED FOR ANXIETY, Disp: 30 tablet, Rfl: 1   atorvastatin (LIPITOR) 20 MG tablet, Take 1 tablet (20 mg total) by mouth daily., Disp: 90 tablet, Rfl: 3   blood glucose meter kit and supplies, Dispense based on patient and insurance preference. Use once daily to check for fasting blood sugars. (FOR ICD-10 E10.9, E11.9)., Disp: 1 each, Rfl: 0   famotidine (PEPCID) 20 MG tablet, TAKE ONE TABLET BY MOUTH TWICE A DAY, Disp: 60 tablet, Rfl: 3   levothyroxine (SYNTHROID) 100 MCG tablet, Take 1 tablet (100 mcg total) by mouth daily., Disp: 90 tablet, Rfl: 1   loperamide (IMODIUM A-D) 2 MG tablet, Take 1 tablet (2 mg total) by mouth 4 (four) times daily as needed for diarrhea or loose stools., Disp: 60 tablet, Rfl: 1   metFORMIN (GLUCOPHAGE) 500 MG tablet, Take 1 tablet (500 mg total) by mouth 2 (two) times daily with a meal., Disp: 180 tablet, Rfl: 3   metoprolol succinate (TOPROL-XL) 50 MG 24 hr tablet, TAKE 1 TABLET BY MOUTH DAILY WITH OR IMMEDIATELY FOLLOWING A MEAL, Disp: 90 tablet, Rfl: 0   Multiple Vitamins-Minerals (PRESERVISION AREDS 2) CAPS, Take 1 tablet by mouth 2 (two) times daily., Disp: , Rfl:    ondansetron (ZOFRAN ODT) 4 MG disintegrating tablet, Take 1 tablet (4 mg total) by mouth every 8 (eight) hours as needed., Disp: 20 tablet, Rfl: 0   Probiotic Product (PROBIOTIC PO), Take by mouth daily. , Disp: , Rfl:    sertraline (ZOLOFT) 100 MG tablet, TAKE ONE AND ONE-HALF (1 & 1/2) TABLET BY MOUTH DAILY, Disp: 135 tablet, Rfl: 2   triamcinolone ointment (KENALOG) 0.5 %, APPLY TO AFFECTED AREAS TOPICALLY TWICE DAILY, Disp: 45 g, Rfl: 1   valsartan-hydrochlorothiazide (DIOVAN-HCT) 160-25 MG tablet, TAKE 1 TABLET BY MOUTH  NIGHTLY, Disp: 90 tablet, Rfl: 1  Observations/Objective: Patient is well-developed, well-nourished in no acute distress.  Resting comfortably at home.  Head is normocephalic, atraumatic.  No labored breathing.  Speech is clear and coherent with logical content.  Patient is alert and oriented at baseline.  Left conjunctiva injected sclera erythematous lids and lashes appear WNL  Assessment and Plan: 1. Acute bacterial conjunctivitis of left eye  - gentamicin (GARAMYCIN) 0.3 % ophthalmic solution; Place 1 drop into both eyes every 4 (four) hours while awake.  Dispense: 5 mL; Refill: 0     Follow Up Instructions: I discussed the assessment and treatment plan with the patient. The patient was provided an opportunity to ask questions and all were answered. The patient agreed with the plan and demonstrated an understanding of the instructions.  A copy of instructions were sent to the patient  via [place].  The patient was advised to call back or seek an in-person evaluation if the symptoms worsen or if the condition fails to improve as anticipated.  Time:  I spent 15 minutes with the patient via telehealth technology discussing the above problems/concerns.    Apolonio Schneiders, FNP

## 2020-11-02 NOTE — Telephone Encounter (Signed)
Patient is calling with exposure to pink eye- her left eye is red with clear discharge. No appointment available at this late time in the day- helped patient make virtual appointment with [facility]. Reason for Disposition  [1] Eye with yellow or green discharge, or eyelashes stick together AND [2] NO PCP standing order to call in antibiotic eye drops  (Exception: [place]; continue triage.)  Answer Assessment - Initial Assessment Questions 1. EYE DISCHARGE: "Is the discharge in one or both eyes?" "What color is it?" "How much is there?" "When did the discharge start?"      Left eye- pink- clear- constant 2. REDNESS OF SCLERA: "Is the redness in one or both eyes?" "When did the redness start?"      Left eye, 2nd day 3. EYELIDS: "Are the eyelids red or swollen?" If Yes, ask: "How much?"      puffy 4. VISION: "Is there any difficulty seeing clearly?"      Blurry vision-tearing 5. PAIN: "Is there any pain? If Yes, ask: "How bad is it?" (Scale 1-10; or mild, moderate, severe)    - MILD (1-3): doesn't interfere with normal activities     - MODERATE (4-7): interferes with normal activities or awakens from sleep    - SEVERE (8-10): excruciating pain, unable to do any normal activities       No- uncomfortable 6. CONTACT LENS: "Do you wear contacts?"     no 7. OTHER SYMPTOMS: "Do you have any other symptoms?" (e.g., fever, runny nose, cough)     Slight runny nose 8. PREGNANCY: "Is there any chance you are pregnant?" "When was your last menstrual period?"     N/a  Protocols used: Eye - Pus or [place]

## 2020-11-02 NOTE — Telephone Encounter (Signed)
Patient called, left VM to return the call to the office to speak to a nurse about her symptoms.   Summary: Pink eye   Pt called to report that she is currently experiencing Pink eye in her left eye. She claims she was exposed by her grandson.  [phone]

## 2020-11-05 NOTE — Telephone Encounter (Signed)
Noted  

## 2020-11-06 ENCOUNTER — Ambulatory Visit (INDEPENDENT_AMBULATORY_CARE_PROVIDER_SITE_OTHER): Payer: Medicare Other | Admitting: Vascular Surgery

## 2020-11-07 ENCOUNTER — Telehealth (INDEPENDENT_AMBULATORY_CARE_PROVIDER_SITE_OTHER): Payer: Medicare Other | Admitting: Family Medicine

## 2020-11-07 NOTE — Progress Notes (Deleted)
MyChart Video Visit    Virtual Visit via Video Note   This visit type was conducted due to national recommendations for restrictions regarding the COVID-19 Pandemic (e.g. social distancing) in an effort to limit this patient's exposure and mitigate transmission in our community. This patient is at least at moderate risk for complications without adequate follow up. This format is felt to be most appropriate for this patient at this time. Physical exam was limited by quality of the video and audio technology used for the visit.   Patient location: *** Provider location: ***  I discussed the limitations of evaluation and management by telemedicine and the availability of in person appointments. The patient expressed understanding and agreed to proceed.  Patient: Christina Rubio   DOB: 02/22/1951   70 y.o. Female  MRN: [id number] Visit Date: 11/07/2020  Today's healthcare provider: Wilhemena Durie, MD   No chief complaint on file.  Subjective    HPI  ***  {Show patient history (optional):23778}  Medications: Outpatient Medications Prior to Visit  Medication Sig   ALPRAZolam (XANAX) 0.5 MG tablet TAKE ONE TABLET EVERY DAY AS NEEDED FOR ANXIETY   atorvastatin (LIPITOR) 20 MG tablet Take 1 tablet (20 mg total) by mouth daily.   blood glucose meter kit and supplies Dispense based on patient and insurance preference. Use once daily to check for fasting blood sugars. (FOR ICD-10 E10.9, E11.9).   famotidine (PEPCID) 20 MG tablet TAKE ONE TABLET BY MOUTH TWICE A DAY   gentamicin (GARAMYCIN) 0.3 % ophthalmic solution Place 1 drop into both eyes every 4 (four) hours while awake.   levothyroxine (SYNTHROID) 100 MCG tablet Take 1 tablet (100 mcg total) by mouth daily.   loperamide (IMODIUM A-D) 2 MG tablet Take 1 tablet (2 mg total) by mouth 4 (four) times daily as needed for diarrhea or loose stools.   metFORMIN (GLUCOPHAGE) 500 MG tablet Take 1 tablet (500 mg total) by mouth 2 (two)  times daily with a meal.   metoprolol succinate (TOPROL-XL) 50 MG 24 hr tablet TAKE 1 TABLET BY MOUTH DAILY WITH OR IMMEDIATELY FOLLOWING A MEAL   Multiple Vitamins-Minerals (PRESERVISION AREDS 2) CAPS Take 1 tablet by mouth 2 (two) times daily.   ondansetron (ZOFRAN ODT) 4 MG disintegrating tablet Take 1 tablet (4 mg total) by mouth every 8 (eight) hours as needed.   Probiotic Product (PROBIOTIC PO) Take by mouth daily.    sertraline (ZOLOFT) 100 MG tablet TAKE ONE AND ONE-HALF (1 & 1/2) TABLET BY MOUTH DAILY   triamcinolone ointment (KENALOG) 0.5 % APPLY TO AFFECTED AREAS TOPICALLY TWICE DAILY   valsartan-hydrochlorothiazide (DIOVAN-HCT) 160-25 MG tablet TAKE 1 TABLET BY MOUTH NIGHTLY   No facility-administered medications prior to visit.    Review of Systems  {Labs  Heme  Chem  Endocrine  Serology  Results Review (optional):23779}  Objective    There were no vitals taken for this visit. {Show previous vital signs (optional):23777}  Physical Exam     Assessment & Plan     ***  No follow-ups on file.     I discussed the assessment and treatment plan with the patient. The patient was provided an opportunity to ask questions and all were answered. The patient agreed with the plan and demonstrated an understanding of the instructions.   The patient was advised to call back or seek an in-person evaluation if the symptoms worsen or if the condition fails to improve as anticipated.  I provided *** minutes of  non-face-to-face time during this encounter.  {provider attestation***:1}  Wilhemena Durie, MD [facility] [phone] (phone) [phone] (fax)  [place]

## 2020-11-20 ENCOUNTER — Other Ambulatory Visit: Payer: Self-pay | Admitting: Family Medicine

## 2020-11-20 NOTE — Telephone Encounter (Signed)
Requested medication (s) are due for refill today: no  Requested medication (s) are on the active medication list: yes  Last refill:  10/05/20 #30 1 RF  Future visit scheduled: yes  Notes to clinic:  med not delegated to NT to RF   Requested Prescriptions  Pending Prescriptions Disp Refills   ALPRAZolam (XANAX) 0.5 MG tablet [Pharmacy Med Name: ALPRAZOLAM 0.5 MG TAB] 30 tablet     Sig: TAKE 1 TABLET BY MOUTH EVERY DAY AS NEEDED FOR ANXIETY.     Not Delegated - Psychiatry:  Anxiolytics/Hypnotics Failed - 11/20/2020  8:16 AM      Failed - This refill cannot be delegated      Failed - Urine Drug Screen completed in last 360 days      Passed - Valid encounter within last 6 months    Recent Outpatient Visits           1 week ago    [facility], Retia Passe., MD   5 months ago Xerosis of skin   [facility] [place], Dionne Bucy, MD   6 months ago Type 2 diabetes mellitus with other specified complication, without long-term current use of insulin [facility])   [facility], Dionne Bucy, MD   6 months ago Cellulitis of right lower extremity   [place], [place]   7 months ago Bilateral lower extremity edema   [facility] [place], Dionne Bucy, MD       Future Appointments             In 1 month Bacigalupo, Dionne Bucy, MD [facility], [place]

## 2020-11-22 ENCOUNTER — Telehealth: Payer: Self-pay

## 2020-11-22 NOTE — Telephone Encounter (Signed)
Copied from [place] [id number]. Topic: General - Call Back - No Documentation >> Nov 22, 2020  4:44 PM Yvette Rack wrote: Reason for CRM: Pt stated she had a missed call. Pt requests call back

## 2020-11-29 ENCOUNTER — Telehealth: Payer: Self-pay

## 2020-11-29 NOTE — Telephone Encounter (Signed)
Was just refilled a week ago.  Cannot give early refills or up the dose without discussion. Consider appt to discuss options for treating anxiety.

## 2020-11-29 NOTE — Telephone Encounter (Signed)
Copied from [place] [id number]. Topic: General - Other >> Nov 29, 2020  9:15 AM Holley Dexter N wrote: Reason for CRM: pt called in stating her daughter is currently in the hospital for a surgery and her anxiety is really bad and pt wanted to know if she could get a refill of her medication and have a up dosage. Please advise.  ALPRAZolam (XANAX) 0.5 MG tablet

## 2020-11-29 NOTE — Telephone Encounter (Signed)
Patient advised.

## 2020-12-04 ENCOUNTER — Encounter: Payer: Self-pay | Admitting: Family Medicine

## 2020-12-04 ENCOUNTER — Other Ambulatory Visit: Payer: Self-pay

## 2020-12-04 ENCOUNTER — Ambulatory Visit (INDEPENDENT_AMBULATORY_CARE_PROVIDER_SITE_OTHER): Payer: Medicare Other | Admitting: Family Medicine

## 2020-12-04 VITALS — BP 131/53 | HR 76 | Resp 16

## 2020-12-04 DIAGNOSIS — F331 Major depressive disorder, recurrent, moderate: Secondary | ICD-10-CM

## 2020-12-04 DIAGNOSIS — I152 Hypertension secondary to endocrine disorders: Secondary | ICD-10-CM

## 2020-12-04 DIAGNOSIS — E034 Atrophy of thyroid (acquired): Secondary | ICD-10-CM

## 2020-12-04 DIAGNOSIS — E1169 Type 2 diabetes mellitus with other specified complication: Secondary | ICD-10-CM

## 2020-12-04 DIAGNOSIS — I890 Lymphedema, not elsewhere classified: Secondary | ICD-10-CM

## 2020-12-04 DIAGNOSIS — F411 Generalized anxiety disorder: Secondary | ICD-10-CM | POA: Diagnosis not present

## 2020-12-04 DIAGNOSIS — I83012 Varicose veins of right lower extremity with ulcer of calf: Secondary | ICD-10-CM | POA: Diagnosis not present

## 2020-12-04 DIAGNOSIS — L97211 Non-pressure chronic ulcer of right calf limited to breakdown of skin: Secondary | ICD-10-CM

## 2020-12-04 DIAGNOSIS — E785 Hyperlipidemia, unspecified: Secondary | ICD-10-CM

## 2020-12-04 DIAGNOSIS — Z23 Encounter for immunization: Secondary | ICD-10-CM | POA: Diagnosis not present

## 2020-12-04 DIAGNOSIS — E1159 Type 2 diabetes mellitus with other circulatory complications: Secondary | ICD-10-CM | POA: Diagnosis not present

## 2020-12-04 DIAGNOSIS — K089 Disorder of teeth and supporting structures, unspecified: Secondary | ICD-10-CM | POA: Insufficient documentation

## 2020-12-04 DIAGNOSIS — Z6841 Body Mass Index (BMI) 40.0 and over, adult: Secondary | ICD-10-CM | POA: Insufficient documentation

## 2020-12-04 DIAGNOSIS — F418 Other specified anxiety disorders: Secondary | ICD-10-CM

## 2020-12-04 MED ORDER — SERTRALINE HCL 100 MG PO TABS: 200.0000 mg | ORAL_TABLET | Freq: Every day | ORAL | 1 refills | Status: DC

## 2020-12-04 NOTE — Assessment & Plan Note (Signed)
needs to resume [facility] RN Wound care May benefit from [employer]

## 2020-12-04 NOTE — Assessment & Plan Note (Signed)
Continue statin Repeat FLP and CMP Goal LDL < 70 

## 2020-12-04 NOTE — Assessment & Plan Note (Signed)
Chronic and uncontrolled As above continue LCSW CCM, set up appt with psych Therapy would be tremendously helpful Increase zoloft to '200mg'$  daily

## 2020-12-04 NOTE — Progress Notes (Signed)
Established patient visit   Patient: Christina Rubio   DOB: 11-Feb-1951   70 y.o. Female  MRN: [id number] Visit Date: 12/04/2020  Today's healthcare provider: Lavon Paganini, MD   Chief Complaint  Patient presents with   Anxiety   Subjective    Anxiety     Anxiety, Follow-up  She was last seen for anxiety 6 months ago. Changes made at last visit include no changes.  Her daughter had COVID, pneumonia and a blood infection and was in the hospital. Along with other familial issues that are cause more stress and anxiety.  She is trying to get an appointment with a psychiatrist. She reports she stopped wellbutrin because it wasn't helping. However is taking zoloft 150 mg and alprazolam 0.5 mg. She takes xanax 1 x a day.  She reports  compliance with treatment. She reports good tolerance of treatment. She  having side effects.   She feels her anxiety is moderate and  since last visit.  Symptoms:  chest pain  difficulty concentrating   dizziness  fatigue   feelings of losing control  insomnia   irritable  palpitations   panic attacks  racing thoughts   shortness of breath  sweating   tremors/shakes    GAD-7 Results GAD-7 Generalized Anxiety Disorder Screening Tool 12/04/2020 01/13/2019 11/17/2017  1. Feeling Nervous, Anxious, or on Edge _0 2. Not Being Able to Stop or Control Worrying _1 3. Worrying Too Much About Different Things _2 4. Trouble Relaxing _3 5. Being So Restless it's Hard To Sit Still 0 0 1  6. Becoming Easily Annoyed or Irritable _4 7. Feeling Afraid As If Something Awful Might Happen _5 Total GAD-7 Score _6 Difficulty At Work, Home, or Getting  Along With Others? - Somewhat difficult Very difficult    PHQ-9 Scores PHQ9 SCORE ONLY 12/04/2020 12/04/2020 05/22/2020  PHQ-9 Total Score _7 Leg wound  She has a sore on the back of her left leg and the wound was reopened. In the past she was wrapped in an unna boot  and she is amenable to a wound nurse coming to assist.  Balance problems  In the last few days she has had issues with her balance and difficulty breathing because she forgets to breath. This will cause her lightheadedness/dizziness.   Broken teeth/esophageal spasms She has old fillings that are causing her teeth to break. She occasionally has difficulty swallowing because of esophageal spasms and it is easier for her to chew and swallow softer foods. She denies having a dentist at this time.   UTI She has intermittent pain when urinating and she is concerned about a UTI.  Medication  She is tolerating all medications and reports good compliance.   Flu Vaccine-12/04/20 ---------------------------------------------------------------------------------------------------     Medications: Outpatient Medications Prior to Visit  Medication Sig   ALPRAZolam (XANAX) 0.5 MG tablet TAKE 1 TABLET BY MOUTH EVERY DAY AS NEEDED FOR ANXIETY.   atorvastatin (LIPITOR) 20 MG tablet Take 1 tablet (20 mg total) by mouth daily.   blood glucose meter kit and supplies Dispense based on patient and insurance preference. Use once daily to check for fasting blood sugars. (FOR ICD-10 E10.9, E11.9).   famotidine (PEPCID) 20 MG tablet TAKE ONE TABLET BY MOUTH TWICE A DAY   gentamicin (GARAMYCIN) 0.3 % ophthalmic solution Place 1 drop into both  eyes every 4 (four) hours while awake.   levothyroxine (SYNTHROID) 100 MCG tablet Take 1 tablet (100 mcg total) by mouth daily.   loperamide (IMODIUM A-D) 2 MG tablet Take 1 tablet (2 mg total) by mouth 4 (four) times daily as needed for diarrhea or loose stools.   metFORMIN (GLUCOPHAGE) 500 MG tablet Take 1 tablet (500 mg total) by mouth 2 (two) times daily with a meal. (Patient taking differently: Take 500 mg by mouth daily with breakfast.)   metoprolol succinate (TOPROL-XL) 50 MG 24 hr tablet TAKE 1 TABLET BY MOUTH DAILY WITH OR IMMEDIATELY FOLLOWING A MEAL   Multiple  Vitamins-Minerals (PRESERVISION AREDS 2) CAPS Take 1 tablet by mouth 2 (two) times daily.   ondansetron (ZOFRAN ODT) 4 MG disintegrating tablet Take 1 tablet (4 mg total) by mouth every 8 (eight) hours as needed.   Probiotic Product (PROBIOTIC PO) Take by mouth daily.    triamcinolone ointment (KENALOG) 0.5 % APPLY TO AFFECTED AREAS TOPICALLY TWICE DAILY   valsartan-hydrochlorothiazide (DIOVAN-HCT) 160-25 MG tablet TAKE 1 TABLET BY MOUTH NIGHTLY   [DISCONTINUED] sertraline (ZOLOFT) 100 MG tablet TAKE ONE AND ONE-HALF (1 & 1/2) TABLET BY MOUTH DAILY   No facility-administered medications prior to visit.    Review of Systems - per HPI     Objective    BP (!) 131/53 (BP Location: Right Arm, Patient Position: Sitting, Cuff Size: Large)   Pulse 76   Resp 16   SpO2 99%  {Show previous vital signs (optional):23777}  Physical Exam Vitals reviewed.  Constitutional:      General: She is not in acute distress.    Appearance: Normal appearance. She is well-developed. She is not diaphoretic.  HENT:     Head: Normocephalic and atraumatic.  Eyes:     General: No scleral icterus.    Conjunctiva/sclera: Conjunctivae normal.  Neck:     Thyroid: No thyromegaly.  Cardiovascular:     Rate and Rhythm: Normal rate and regular rhythm.     Pulses: Normal pulses.     Heart sounds: Normal heart sounds. No murmur heard. Pulmonary:     Effort: Pulmonary effort is normal. No respiratory distress.     Breath sounds: Normal breath sounds. No wheezing, rhonchi or rales.  Musculoskeletal:     Cervical back: Neck supple.     Right lower leg: No edema.     Left lower leg: No edema.  Lymphadenopathy:     Cervical: No cervical adenopathy.  Skin:    General: Skin is warm and dry.     Findings: No rash.  Neurological:     Mental Status: She is alert and oriented to person, place, and time. Mental status is at baseline.  Psychiatric:        Mood and Affect: Mood normal.        Behavior: Behavior normal.     Small ulcerations of R LE  No results found for any visits on 12/04/20.  Assessment & Plan     Problem List Items Addressed This Visit       Cardiovascular and Mediastinum   Hypertension associated with diabetes ([place])    Well controlled Continue current medications Recheck metabolic panel F/u in 6 months       Relevant Orders   Comprehensive metabolic panel     Digestive   Poor dentition    Patient is looking into possible coverage for implants        Endocrine   Hyperlipidemia associated with type 2  diabetes mellitus (HCC)    Continue statin Repeat FLP and CMP Goal LDL < 70       Relevant Orders   Comprehensive metabolic panel   Lipid panel   Hypothyroidism due to acquired atrophy of thyroid    Previously with slightly elevated TSH Continue Synthroid at current dose  Recheck TSH and adjust Synthroid as indicated        Relevant Orders   TSH   T2DM (type 2 diabetes mellitus) (HCC)    Previous control borderline Recheck A1c No changes to meds Continue statin Needs screenings      Relevant Orders   Hemoglobin A1c     Musculoskeletal and Integument   Venous stasis ulcer of right calf limited to breakdown of skin ([place])    needs to resume [facility] RN Wound care May benefit from [employer] boots      Relevant Orders   Ambulatory referral to [facility]     Other   Morbid obesity ([place]) (Chronic)    Discussed importance of healthy weight management Discussed diet and exercise       MDD (major depressive disorder)    Chronic and uncontrolled As above continue LCSW CCM, set up appt with psych Therapy would be tremendously helpful Increase zoloft to 253m daily      Relevant Medications   sertraline (ZOLOFT) 100 MG tablet   GAD (generalized anxiety disorder)    Chronic and uncontrolled Related to her daughter being ill Trying to set up psych appt soon Continue LCSW CCM Increase zoloft to 200 mg daily Continue low dose Xanax - will not increase # of  pills per day Stopped wellbutrin as it was not helpful      Relevant Medications   sertraline (ZOLOFT) 100 MG tablet   Lymphedema - Primary    Resume [facility] RN wound care      Relevant Orders   Ambulatory referral to [facility]  BMI 60.0-69.9, adult ([facility]   Other Visit Diagnoses     Depression with anxiety       Relevant Medications   sertraline (ZOLOFT) 100 MG tablet        Return in about 3 months (around 03/05/2021) for chronic disease f/u.       Total time spent on today's visit was greater than 40 minutes, including both face-to-face time and nonface-to-face time personally spent on review of chart (labs and imaging), discussing labs and goals, discussing further work-up, treatment options, referrals to specialist if needed, reviewing outside records of pertinent, answering patient's questions, and coordinating care.    I,Essence Turner,acting as a sEducation administratorfor ALavon Paganini MD.,have documented all relevant documentation on the behalf of ALavon Paganini MD,as directed by  ALavon Paganini MD while in the presence of ALavon Paganini MD.  I, ALavon Paganini MD, have reviewed all documentation for this visit. The documentation on 12/04/20 for the exam, diagnosis, procedures, and orders are all accurate and complete.   Candido Flott, ADionne Bucy MD, MPH [facility]

## 2020-12-04 NOTE — Assessment & Plan Note (Signed)
Chronic and uncontrolled Related to her daughter being ill Trying to set up psych appt soon Continue LCSW CCM Increase zoloft to 200 mg daily Continue low dose Xanax - will not increase # of pills per day Stopped wellbutrin as it was not helpful

## 2020-12-04 NOTE — Assessment & Plan Note (Signed)
Well controlled Continue current medications Recheck metabolic panel F/u in 6 months  

## 2020-12-04 NOTE — Assessment & Plan Note (Signed)
Patient is looking into possible coverage for implants

## 2020-12-04 NOTE — Assessment & Plan Note (Signed)
Previously with slightly elevated TSH Continue Synthroid at current dose  Recheck TSH and adjust Synthroid as indicated

## 2020-12-04 NOTE — Assessment & Plan Note (Signed)
Resume HH RN wound care

## 2020-12-04 NOTE — Addendum Note (Signed)
Addended by: Shawna Orleans on: 12/04/2020 10:22 AM   Modules accepted: Orders

## 2020-12-04 NOTE — Assessment & Plan Note (Signed)
Discussed importance of healthy weight management Discussed diet and exercise  

## 2020-12-04 NOTE — Assessment & Plan Note (Signed)
Previous control borderline Recheck A1c No changes to meds Continue statin Needs screenings

## 2020-12-05 ENCOUNTER — Telehealth: Payer: Self-pay | Admitting: Family Medicine

## 2020-12-05 LAB — COMPREHENSIVE METABOLIC PANEL
ALT: 12 IU/L (ref 0–32)
AST: 18 IU/L (ref 0–40)
Albumin/Globulin Ratio: 1.4 (ref 1.2–2.2)
Albumin: 4.2 g/dL (ref 3.8–4.8)
Alkaline Phosphatase: 39 IU/L — ABNORMAL LOW (ref 44–121)
BUN/Creatinine Ratio: 16 (ref 12–28)
BUN: 16 mg/dL (ref 8–27)
Bilirubin Total: 0.6 mg/dL (ref 0.0–1.2)
CO2: 23 mmol/L (ref 20–29)
Calcium: 9.3 mg/dL (ref 8.7–10.3)
Chloride: 100 mmol/L (ref 96–106)
Creatinine, Ser: 1.01 mg/dL — ABNORMAL HIGH (ref 0.57–1.00)
Globulin, Total: 2.9 g/dL (ref 1.5–4.5)
Glucose: 130 mg/dL — ABNORMAL HIGH (ref 65–99)
Potassium: 3.8 mmol/L (ref 3.5–5.2)
Sodium: 138 mmol/L (ref 134–144)
Total Protein: 7.1 g/dL (ref 6.0–8.5)
eGFR: 60 mL/min/{1.73_m2} (ref >59)

## 2020-12-05 LAB — LIPID PANEL
Chol/HDL Ratio: 3.2 ratio (ref 0.0–4.4)
Cholesterol, Total: 138 mg/dL (ref 100–199)
HDL: 43 mg/dL (ref >39)
LDL Chol Calc (NIH): 67 mg/dL (ref 0–99)
Triglycerides: 166 mg/dL — ABNORMAL HIGH (ref 0–149)
VLDL Cholesterol Cal: 28 mg/dL (ref 5–40)

## 2020-12-05 LAB — HEMOGLOBIN A1C
Est. average glucose Bld gHb Est-mCnc: 131 mg/dL
Hgb A1c MFr Bld: 6.2 % — ABNORMAL HIGH (ref 4.8–5.6)

## 2020-12-05 LAB — TSH: TSH: 5.7 u[IU]/mL — ABNORMAL HIGH (ref 0.450–4.500)

## 2020-12-05 NOTE — Telephone Encounter (Signed)
I called and spoke with patient. She states she was seen in the office yesterday and was given a sample cup to take home to collect a urine sample for possible UTI. Patient has collected the urine sample but doesn't have anyone to bring it until later today or possibly tomorrow. I advised patient that she needs to keep the urine sample refrigerated. Patient verbalized understanding. I didn't see any orders for a urine sample in the office note from yesterday. Do you remember what urine test you wanted to order?

## 2020-12-05 NOTE — Telephone Encounter (Signed)
Patient called in asking about urine sample she has at home. She is asking does it matter how long she has it before sending it in. Please call back

## 2020-12-06 ENCOUNTER — Telehealth: Payer: Self-pay | Admitting: Family Medicine

## 2020-12-06 ENCOUNTER — Other Ambulatory Visit: Payer: Self-pay

## 2020-12-06 DIAGNOSIS — K224 Dyskinesia of esophagus: Secondary | ICD-10-CM | POA: Diagnosis not present

## 2020-12-06 DIAGNOSIS — E039 Hypothyroidism, unspecified: Secondary | ICD-10-CM | POA: Diagnosis not present

## 2020-12-06 DIAGNOSIS — E034 Atrophy of thyroid (acquired): Secondary | ICD-10-CM | POA: Diagnosis not present

## 2020-12-06 DIAGNOSIS — E785 Hyperlipidemia, unspecified: Secondary | ICD-10-CM | POA: Diagnosis not present

## 2020-12-06 DIAGNOSIS — S71111D Laceration without foreign body, right thigh, subsequent encounter: Secondary | ICD-10-CM | POA: Diagnosis not present

## 2020-12-06 DIAGNOSIS — F331 Major depressive disorder, recurrent, moderate: Secondary | ICD-10-CM | POA: Diagnosis not present

## 2020-12-06 DIAGNOSIS — I872 Venous insufficiency (chronic) (peripheral): Secondary | ICD-10-CM | POA: Diagnosis not present

## 2020-12-06 DIAGNOSIS — R3989 Other symptoms and signs involving the genitourinary system: Secondary | ICD-10-CM | POA: Diagnosis not present

## 2020-12-06 DIAGNOSIS — R52 Pain, unspecified: Secondary | ICD-10-CM

## 2020-12-06 DIAGNOSIS — E1159 Type 2 diabetes mellitus with other circulatory complications: Secondary | ICD-10-CM | POA: Diagnosis not present

## 2020-12-06 DIAGNOSIS — I152 Hypertension secondary to endocrine disorders: Secondary | ICD-10-CM | POA: Diagnosis not present

## 2020-12-06 DIAGNOSIS — R32 Unspecified urinary incontinence: Secondary | ICD-10-CM | POA: Diagnosis not present

## 2020-12-06 DIAGNOSIS — E1151 Type 2 diabetes mellitus with diabetic peripheral angiopathy without gangrene: Secondary | ICD-10-CM | POA: Diagnosis not present

## 2020-12-06 DIAGNOSIS — Z6841 Body Mass Index (BMI) 40.0 and over, adult: Secondary | ICD-10-CM | POA: Diagnosis not present

## 2020-12-06 DIAGNOSIS — F411 Generalized anxiety disorder: Secondary | ICD-10-CM | POA: Diagnosis not present

## 2020-12-06 DIAGNOSIS — I890 Lymphedema, not elsewhere classified: Secondary | ICD-10-CM | POA: Diagnosis not present

## 2020-12-06 DIAGNOSIS — E1169 Type 2 diabetes mellitus with other specified complication: Secondary | ICD-10-CM | POA: Diagnosis not present

## 2020-12-06 NOTE — Telephone Encounter (Signed)
OK for verbals 

## 2020-12-06 NOTE — Telephone Encounter (Signed)
Cara advised.

## 2020-12-06 NOTE — Telephone Encounter (Signed)
Cara from [facility] called in stating they did see pt and saw the bruises, but believes they are coming from when the pt slides. She also stated that scabs have grown over them and she believes that "border gouts"? Should be placed over them to protect it.   Home Health Verbal Orders - Caller/Agency: [facility] Number: [phone]  Requesting OT/PT/Skilled Nursing/Social Work/Speech Therapy: (OT & PT-evaul), and  Nursing  Frequency: 1w1, 2w1, 3w1 (just Nursing)

## 2020-12-07 ENCOUNTER — Ambulatory Visit (INDEPENDENT_AMBULATORY_CARE_PROVIDER_SITE_OTHER): Payer: Medicare Other

## 2020-12-07 DIAGNOSIS — I152 Hypertension secondary to endocrine disorders: Secondary | ICD-10-CM

## 2020-12-07 DIAGNOSIS — E1159 Type 2 diabetes mellitus with other circulatory complications: Secondary | ICD-10-CM

## 2020-12-07 DIAGNOSIS — Z9181 History of falling: Secondary | ICD-10-CM

## 2020-12-07 DIAGNOSIS — R3989 Other symptoms and signs involving the genitourinary system: Secondary | ICD-10-CM

## 2020-12-07 DIAGNOSIS — E1169 Type 2 diabetes mellitus with other specified complication: Secondary | ICD-10-CM

## 2020-12-07 NOTE — Addendum Note (Signed)
Addended by: Randal Buba on: 12/07/2020 05:15 PM   Modules accepted: Orders

## 2020-12-07 NOTE — Patient Instructions (Addendum)
Thank you for allowing the Chronic Care Management team to participate in your care.   Goals Addressed: Patient Care Plan: Fall Risk (Adult)     Problem Identified: Fall Risk      Long-Range Goal: Absence of Fall and Fall-Related Injury   Start Date: 12/07/2020  Expected End Date: 03/07/2021  Priority: High  Note:   Current Barriers:  Difficulty Ambulating r/t Spinal Stenosis and gait instability.  Clinical Goal(s):  Over the next 90 days, patient will not require hospitalization or emergent care d/t fall related injuries.   Interventions:  Collaboration with Brita Romp Dionne Bucy, MD regarding development and update of comprehensive plan of care as evidenced by provider attestation and co-signature Inter-disciplinary care team collaboration (see longitudinal plan of care) Reviewed safety and fall prevention  measures. Her mobility remains somewhat limited. She is receiving therapy in the home. Reports using assistive devices as recommended and following activity restrictions as advised by the home health team. She previously made arrangement for assistance with cleaning and tasks in the home. Declined current need for additional assistance. Agreed to update the care management team if this changes and additional assistance is required.    Patient Goals/Self-Care Activities Utilize assistive devices appropriately with all ambulation Ensure walking pathways are clear and well lit. Change positions slowly Wear secure fitting/skid free footwear when ambulating Adhere to activity restrictions as advised by the home health therapy team   Follow Up Plan:  Will follow up in three months     Patient Care Plan: Diabetes Type 2 (Adult)     Problem Identified: Disease Progression (Diabetes, Type 2)      Long-Range Goal: Disease Progression Prevented or Minimized   Start Date: 12/07/2020  Expected End Date: 03/07/2021  Priority: Medium  Note:   Objective:  Lab Results  Component  Value Date   HGBA1C 6.2 (H) 12/04/2020    Lab Results  Component Value Date   HGBA1C 7.0 (H) 05/22/2020    Current Barriers:  Chronic disease management support and educational needs related to Diabetes self-management   Clinical Goal(s):  Over the next 90 days, patient will demonstrate adherence to prescribed treatment plan for Diabetes management as evidenced by taking medications as prescribed, monitoring and recording CBGs, and adherence to an ADA/ carb modified diet.   Interventions:  Collaboration with Brita Romp Dionne Bucy, MD regarding development and update of comprehensive plan of care as evidenced by provider attestation and co-signature Inter-disciplinary care team collaboration (see longitudinal plan of care) Reviewed medications and compliance with treatment plan. Reports a few days of forgetting to take medications and monitor blood glucose due to being busy with a family member. Overall reports managing well. Her activity is limited but still attempting to comply with a diabetic/carb modified diet. Her A1C has improved from 7% to 6.2%. Reviewed s/sx of hypoglycemia and hyperglycemia and appropriate interventions. Advised to continue monitoring and recording blood glucose readings. Discussed concerns regarding suspected urinary tract infection. She received the specimen cup but was unable to return it to the clinic as anticipated. Reviewed instructions to refrigerate the specimen. She confirmed that a friend will deliver the specimen to the clinic later today.   Patient Goals/Self-Care Activities Self administer medications as prescribed Attend all scheduled provider appointments Monitor blood glucose levels consistently and utilize recommended interventions Adhere to recommended diet. Notify provider or care management team with health related questions and concerns as needed   Follow Up Plan:  Will follow up in three months  Ms. Stariha verbalized  understanding of the information discussed during the telephonic outreach. Declined need for mailed/printed instructions. A member of the care management team will follow up within three months.     [name] Care Management [facility] [phone]

## 2020-12-07 NOTE — Telephone Encounter (Signed)
Urine sample dropped off at 5pm. I drew up urine samples and placed them in [facility] drop box inside our building.

## 2020-12-07 NOTE — Chronic Care Management (AMB) (Signed)
Chronic Care Management   CCM RN Visit Note  12/07/2020 Name: Christina Rubio MRN: [id number] DOB: 13-Aug-1950  Subjective: Christina Rubio is a 70 y.o. year old female who is a primary care patient of Bacigalupo, Marzella Schlein, MD. The care management team was consulted for assistance with disease management and care coordination needs.    Engaged with patient by telephone for follow up visit in response to provider referral for case management and care coordination services.   Consent to Services:  The patient was given information about Chronic Care Management services, agreed to services, and gave verbal consent prior to initiation of services.  Please see initial visit note for detailed documentation.    Assessment: Review of patient past medical history, allergies, medications, health status, including review of consultants reports, laboratory and other test data, was performed as part of comprehensive evaluation and provision of chronic care management services.   SDOH (Social Determinants of Health) assessments and interventions performed: No  CCM Care Plan  Allergies  Allergen Reactions   Shellfish Allergy     Patient reported.   Epinephrine Palpitations    faint   Penicillins Rash    Has patient had a PCN reaction causing immediate rash, facial/tongue/throat swelling, SOB or lightheadedness with hypotension:  Yes Has patient had a PCN reaction causing severe rash involving mucus membranes or skin necrosis: No Has patient had a PCN reaction that required hospitalization: No Has patient had a PCN reaction occurring within the last 10 years: No If all of the above answers are "NO", then may proceed with Cephalosporin use.;   Strawberry Extract Rash    Outpatient Encounter Medications as of 12/07/2020  Medication Sig Note   ALPRAZolam (XANAX) 0.5 MG tablet TAKE 1 TABLET BY MOUTH EVERY DAY AS NEEDED FOR ANXIETY.    atorvastatin (LIPITOR) 20 MG tablet Take 1 tablet (20 mg total) by  mouth daily.    blood glucose meter kit and supplies Dispense based on patient and insurance preference. Use once daily to check for fasting blood sugars. (FOR ICD-10 E10.9, E11.9).    famotidine (PEPCID) 20 MG tablet TAKE ONE TABLET BY MOUTH TWICE A DAY 04/24/2020: Reports usually taking once a day.   gentamicin (GARAMYCIN) 0.3 % ophthalmic solution Place 1 drop into both eyes every 4 (four) hours while awake.    levothyroxine (SYNTHROID) 100 MCG tablet Take 1 tablet (100 mcg total) by mouth daily.    loperamide (IMODIUM A-D) 2 MG tablet Take 1 tablet (2 mg total) by mouth 4 (four) times daily as needed for diarrhea or loose stools.    metFORMIN (GLUCOPHAGE) 500 MG tablet Take 1 tablet (500 mg total) by mouth 2 (two) times daily with a meal. (Patient taking differently: Take 500 mg by mouth daily with breakfast.)    metoprolol succinate (TOPROL-XL) 50 MG 24 hr tablet TAKE 1 TABLET BY MOUTH DAILY WITH OR IMMEDIATELY FOLLOWING A MEAL    Multiple Vitamins-Minerals (PRESERVISION AREDS 2) CAPS Take 1 tablet by mouth 2 (two) times daily.    ondansetron (ZOFRAN ODT) 4 MG disintegrating tablet Take 1 tablet (4 mg total) by mouth every 8 (eight) hours as needed.    Probiotic Product (PROBIOTIC PO) Take by mouth daily.     sertraline (ZOLOFT) 100 MG tablet Take 2 tablets (200 mg total) by mouth daily.    triamcinolone ointment (KENALOG) 0.5 % APPLY TO AFFECTED AREAS TOPICALLY TWICE DAILY    valsartan-hydrochlorothiazide (DIOVAN-HCT) 160-25 MG tablet TAKE 1 TABLET BY MOUTH NIGHTLY  No facility-administered encounter medications on file as of 12/07/2020.    Patient Active Problem List   Diagnosis Date Noted   Poor dentition 12/04/2020   BMI 60.0-69.9, adult ([place]) 12/04/2020   Lymphedema 05/23/2020   Venous stasis ulcer of right calf limited to breakdown of skin ([place]) 05/23/2020   Bilateral lower extremity edema 01/16/2020   Esophageal dysphagia 01/13/2019   Toenail deformity 01/13/2019   Chest pain  09/22/2018   Bradycardia 09/22/2018   GAD (generalized anxiety disorder) 05/24/2018   T2DM (type 2 diabetes mellitus) ([place]) 05/24/2018   Venous stasis 07/28/2017   Spinal stenosis 05/28/2017   DJD (degenerative joint disease) 05/28/2017   Hypertension associated with diabetes ([place]) 02/10/2016   Hyperlipidemia associated with type 2 diabetes mellitus ([place]) 02/10/2016   MDD (major depressive disorder) 02/10/2016   Hypothyroidism due to acquired atrophy of thyroid 02/10/2016   Morbid obesity ([place]) 01/09/2016    Conditions to be addressed/monitored: Fall Risk,  DMII, and Suspected UTI  Patient Care Plan: Fall Risk (Adult)     Problem Identified: Fall Risk      Long-Range Goal: Absence of Fall and Fall-Related Injury   Start Date: 12/07/2020  Expected End Date: 03/07/2021  Priority: High  Note:   Current Barriers:  Difficulty Ambulating r/t Spinal Stenosis and gait instability.  Clinical Goal(s):  Over the next 90 days, patient will not require hospitalization or emergent care d/t fall related injuries.   Interventions:  Collaboration with Brita Romp Dionne Bucy, MD regarding development and update of comprehensive plan of care as evidenced by provider attestation and co-signature Inter-disciplinary care team collaboration (see longitudinal plan of care) Reviewed safety and fall prevention  measures. Her mobility remains somewhat limited. She is receiving therapy in the home. Reports using assistive devices as recommended and following activity restrictions as advised by the home health team. She previously made arrangement for assistance with cleaning and tasks in the home. Declined current need for additional assistance. Agreed to update the care management team if this changes and additional assistance is required.    Patient Goals/Self-Care Activities Utilize assistive devices appropriately with all ambulation Ensure walking pathways are clear and well lit. Change positions  slowly Wear secure fitting/skid free footwear when ambulating Adhere to activity restrictions as advised by the home health therapy team   Follow Up Plan:  Will follow up in three months     Patient Care Plan: Diabetes Type 2 (Adult)     Problem Identified: Disease Progression (Diabetes, Type 2)      Long-Range Goal: Disease Progression Prevented or Minimized   Start Date: 12/07/2020  Expected End Date: 03/07/2021  Priority: Medium  Note:   Objective:  Lab Results  Component Value Date   HGBA1C 6.2 (H) 12/04/2020    Lab Results  Component Value Date   HGBA1C 7.0 (H) 05/22/2020    Current Barriers:  Chronic disease management support and educational needs related to Diabetes self-management   Clinical Goal(s):  Over the next 90 days, patient will demonstrate adherence to prescribed treatment plan for Diabetes management as evidenced by taking medications as prescribed, monitoring and recording CBGs, and adherence to an ADA/ carb modified diet.   Interventions:  Collaboration with Brita Romp Dionne Bucy, MD regarding development and update of comprehensive plan of care as evidenced by provider attestation and co-signature Inter-disciplinary care team collaboration (see longitudinal plan of care) Reviewed medications and compliance with treatment plan. Reports a few days of forgetting to take medications and monitor blood glucose due to  being busy with a family member. Overall reports managing well. Her activity is limited but still attempting to comply with a diabetic/carb modified diet. Her A1C has improved from 7% to 6.2%. Reviewed s/sx of hypoglycemia and hyperglycemia and appropriate interventions. Advised to continue monitoring and recording blood glucose readings. Discussed concerns regarding suspected urinary tract infection. She received the specimen cup but was unable to return it to the clinic as anticipated. Reviewed instructions to refrigerate the specimen. She  confirmed that a friend will deliver the specimen to the clinic later today.   Patient Goals/Self-Care Activities Self administer medications as prescribed Attend all scheduled provider appointments Monitor blood glucose levels consistently and utilize recommended interventions Adhere to recommended diet. Notify provider or care management team with health related questions and concerns as needed   Follow Up Plan:  Will follow up in three months        PLAN: A member of the care management team will follow up within three months.     [name] [phone]

## 2020-12-08 LAB — URINALYSIS, ROUTINE W REFLEX MICROSCOPIC
Bilirubin, UA: NEGATIVE
Glucose, UA: NEGATIVE
Ketones, UA: NEGATIVE
Nitrite, UA: NEGATIVE
Protein,UA: NEGATIVE
RBC, UA: NEGATIVE
Specific Gravity, UA: 1.018 (ref 1.005–1.030)
Urobilinogen, Ur: 0.2 mg/dL (ref 0.2–1.0)
pH, UA: 6 (ref 5.0–7.5)

## 2020-12-08 LAB — MICROSCOPIC EXAMINATION
Casts: NONE SEEN /lpf
Epithelial Cells (non renal): >10 /hpf — AB (ref 0–10)

## 2020-12-12 LAB — URINE CULTURE

## 2020-12-14 ENCOUNTER — Telehealth: Payer: Self-pay

## 2020-12-14 NOTE — Telephone Encounter (Signed)
Verbal orders have been given. KW

## 2020-12-14 NOTE — Telephone Encounter (Signed)
Copied from [place] [phone]. Topic: Quick Communication - Home Health Verbal Orders >> Dec 14, 2020 12:04 PM Pawlus, Brayton Layman A wrote: Caller/Agency: [facility]  Callback Number: [phone] Requesting: OT Frequency: 1x6  Also requesting a home health aid to assist in bathing.

## 2020-12-14 NOTE — Telephone Encounter (Signed)
Patient advised of lab result.   Copied from [place] [phone]. Topic: General - Other >> Dec 14, 2020  4:02 PM Celene Kras wrote: Reason for CRM: Pt called and is requesting to have a nurse give a call back to go over the results for her UA. Please advise.

## 2020-12-20 ENCOUNTER — Other Ambulatory Visit: Payer: Self-pay | Admitting: Family Medicine

## 2020-12-20 ENCOUNTER — Telehealth: Payer: Self-pay

## 2020-12-20 DIAGNOSIS — E034 Atrophy of thyroid (acquired): Secondary | ICD-10-CM

## 2020-12-20 NOTE — Telephone Encounter (Signed)
Copied from [place] [phone]. Topic: Quick Communication - Home Health Verbal Orders >> Dec 20, 2020  2:34 PM Tessa Lerner A wrote: Caller/Agency: [facility]  Callback Number: [phone]  Requesting OT/PT/Skilled Nursing/Social Work/Speech Therapy: Skilled Nursing  Frequency: 1w4  Merry Proud would also like to know if it is possible to wrap the right leg with Unna-boot 1 weekly for three weeks   The patient has blisters on their right leg from edema

## 2020-12-20 NOTE — Telephone Encounter (Signed)
OK for verbals 

## 2020-12-21 DIAGNOSIS — E1169 Type 2 diabetes mellitus with other specified complication: Secondary | ICD-10-CM

## 2020-12-21 NOTE — Telephone Encounter (Signed)
Left detailed message for Merry Proud as below.

## 2020-12-24 ENCOUNTER — Ambulatory Visit: Payer: Self-pay | Admitting: Family Medicine

## 2020-12-24 ENCOUNTER — Telehealth: Payer: Self-pay | Admitting: Family Medicine

## 2020-12-24 DIAGNOSIS — F410 Panic disorder [episodic paroxysmal anxiety] without agoraphobia: Secondary | ICD-10-CM | POA: Diagnosis not present

## 2020-12-24 DIAGNOSIS — F411 Generalized anxiety disorder: Secondary | ICD-10-CM | POA: Diagnosis not present

## 2020-12-24 DIAGNOSIS — F5105 Insomnia due to other mental disorder: Secondary | ICD-10-CM | POA: Diagnosis not present

## 2020-12-24 DIAGNOSIS — F331 Major depressive disorder, recurrent, moderate: Secondary | ICD-10-CM | POA: Diagnosis not present

## 2020-12-24 NOTE — Telephone Encounter (Signed)
Home Health Verbal Orders - Caller/Agency: Christina Rubio Number: [phone] Requesting OT/PT/Skilled Nursing/Social Work/Speech Therapy: PT  Frequency:  1w7

## 2020-12-25 NOTE — Telephone Encounter (Signed)
Verbal okay was given. 

## 2021-01-05 DIAGNOSIS — I890 Lymphedema, not elsewhere classified: Secondary | ICD-10-CM | POA: Diagnosis not present

## 2021-01-05 DIAGNOSIS — E785 Hyperlipidemia, unspecified: Secondary | ICD-10-CM | POA: Diagnosis not present

## 2021-01-05 DIAGNOSIS — E1169 Type 2 diabetes mellitus with other specified complication: Secondary | ICD-10-CM | POA: Diagnosis not present

## 2021-01-05 DIAGNOSIS — F411 Generalized anxiety disorder: Secondary | ICD-10-CM | POA: Diagnosis not present

## 2021-01-05 DIAGNOSIS — I872 Venous insufficiency (chronic) (peripheral): Secondary | ICD-10-CM | POA: Diagnosis not present

## 2021-01-05 DIAGNOSIS — F331 Major depressive disorder, recurrent, moderate: Secondary | ICD-10-CM | POA: Diagnosis not present

## 2021-01-05 DIAGNOSIS — F410 Panic disorder [episodic paroxysmal anxiety] without agoraphobia: Secondary | ICD-10-CM | POA: Diagnosis not present

## 2021-01-05 DIAGNOSIS — R32 Unspecified urinary incontinence: Secondary | ICD-10-CM | POA: Diagnosis not present

## 2021-01-05 DIAGNOSIS — Z6841 Body Mass Index (BMI) 40.0 and over, adult: Secondary | ICD-10-CM | POA: Diagnosis not present

## 2021-01-05 DIAGNOSIS — E034 Atrophy of thyroid (acquired): Secondary | ICD-10-CM | POA: Diagnosis not present

## 2021-01-05 DIAGNOSIS — K224 Dyskinesia of esophagus: Secondary | ICD-10-CM | POA: Diagnosis not present

## 2021-01-05 DIAGNOSIS — E1159 Type 2 diabetes mellitus with other circulatory complications: Secondary | ICD-10-CM | POA: Diagnosis not present

## 2021-01-05 DIAGNOSIS — E1151 Type 2 diabetes mellitus with diabetic peripheral angiopathy without gangrene: Secondary | ICD-10-CM | POA: Diagnosis not present

## 2021-01-05 DIAGNOSIS — I152 Hypertension secondary to endocrine disorders: Secondary | ICD-10-CM | POA: Diagnosis not present

## 2021-01-05 DIAGNOSIS — S71111D Laceration without foreign body, right thigh, subsequent encounter: Secondary | ICD-10-CM | POA: Diagnosis not present

## 2021-01-05 DIAGNOSIS — E039 Hypothyroidism, unspecified: Secondary | ICD-10-CM | POA: Diagnosis not present

## 2021-01-05 DIAGNOSIS — F5105 Insomnia due to other mental disorder: Secondary | ICD-10-CM | POA: Diagnosis not present

## 2021-01-24 ENCOUNTER — Telehealth: Payer: Self-pay

## 2021-01-24 NOTE — Telephone Encounter (Signed)
Copied from [place] [phone]. Topic: General - Other >> Jan 24, 2021 11:55 AM Oneta Rack wrote: Reason for CRM: Patient would like to know if she has had a Medicare Wellness this year because [employer] will mail her a $25 check. Patient would like a follow up call confirming the date.

## 2021-01-25 ENCOUNTER — Other Ambulatory Visit: Payer: Self-pay | Admitting: Physician Assistant

## 2021-01-25 DIAGNOSIS — E1169 Type 2 diabetes mellitus with other specified complication: Secondary | ICD-10-CM

## 2021-01-25 DIAGNOSIS — E1159 Type 2 diabetes mellitus with other circulatory complications: Secondary | ICD-10-CM

## 2021-01-25 DIAGNOSIS — E034 Atrophy of thyroid (acquired): Secondary | ICD-10-CM

## 2021-01-25 NOTE — Telephone Encounter (Signed)
Patient does have an appt 02/21/21 at 11:20 AM. Patient would like to have lab done the day before appt. Patient aware lab is closed from 11:30-1pm. Please advise.

## 2021-01-25 NOTE — Progress Notes (Signed)
Per pt wants to do labs before appointment

## 2021-01-25 NOTE — Telephone Encounter (Signed)
Patient advised.

## 2021-01-29 ENCOUNTER — Telehealth: Payer: Self-pay

## 2021-01-29 NOTE — Telephone Encounter (Signed)
Form in basket for Dr. B to sign.

## 2021-01-29 NOTE — Telephone Encounter (Signed)
Copied from [place] [phone]. Topic: General - Inquiry >> Jan 29, 2021 12:10 PM Loma [place] wrote: Pt has called  wanting call back as her handicap sticker is expired and she wants the paperwork for another. Call pt at [phone] to advise as she really is not able to get out much but wants for emergency. Fu to advise Last seen in Sept 22

## 2021-01-30 DIAGNOSIS — F331 Major depressive disorder, recurrent, moderate: Secondary | ICD-10-CM | POA: Diagnosis not present

## 2021-01-30 DIAGNOSIS — F5105 Insomnia due to other mental disorder: Secondary | ICD-10-CM | POA: Diagnosis not present

## 2021-01-30 DIAGNOSIS — F411 Generalized anxiety disorder: Secondary | ICD-10-CM | POA: Diagnosis not present

## 2021-01-30 DIAGNOSIS — F410 Panic disorder [episodic paroxysmal anxiety] without agoraphobia: Secondary | ICD-10-CM | POA: Diagnosis not present

## 2021-01-31 ENCOUNTER — Telehealth: Payer: Self-pay | Admitting: Family Medicine

## 2021-01-31 NOTE — Telephone Encounter (Signed)
Patient aware that application for her handicap sticker is placed up front ready for pick up.

## 2021-01-31 NOTE — Telephone Encounter (Signed)
OK for verbals 

## 2021-01-31 NOTE — Telephone Encounter (Signed)
Left detailed message for Christina Rubio.

## 2021-01-31 NOTE — Telephone Encounter (Signed)
Home Health Verbal Orders - Caller/AgencyEarl Rubio Number: [phone] Requesting PT Frequency: 1x1/ 2x4 /1x4

## 2021-02-01 ENCOUNTER — Telehealth: Payer: Self-pay | Admitting: Family Medicine

## 2021-02-01 NOTE — Telephone Encounter (Signed)
Home Health Verbal Orders - Caller/Agency: [place] Number: [phone]  Requesting OT/PT/Skilled Nursing/Social Work/Speech Therapy: OD once every week- Home health aid-1w8  Frequency: *

## 2021-02-01 NOTE — Telephone Encounter (Signed)
Left message for Christina Rubio on voicemail.

## 2021-02-01 NOTE — Telephone Encounter (Signed)
OK for verbals 

## 2021-02-04 DIAGNOSIS — R32 Unspecified urinary incontinence: Secondary | ICD-10-CM | POA: Diagnosis not present

## 2021-02-04 DIAGNOSIS — I872 Venous insufficiency (chronic) (peripheral): Secondary | ICD-10-CM | POA: Diagnosis not present

## 2021-02-04 DIAGNOSIS — S71111D Laceration without foreign body, right thigh, subsequent encounter: Secondary | ICD-10-CM | POA: Diagnosis not present

## 2021-02-04 DIAGNOSIS — F331 Major depressive disorder, recurrent, moderate: Secondary | ICD-10-CM | POA: Diagnosis not present

## 2021-02-04 DIAGNOSIS — E1159 Type 2 diabetes mellitus with other circulatory complications: Secondary | ICD-10-CM | POA: Diagnosis not present

## 2021-02-04 DIAGNOSIS — E034 Atrophy of thyroid (acquired): Secondary | ICD-10-CM | POA: Diagnosis not present

## 2021-02-04 DIAGNOSIS — I890 Lymphedema, not elsewhere classified: Secondary | ICD-10-CM | POA: Diagnosis not present

## 2021-02-04 DIAGNOSIS — E039 Hypothyroidism, unspecified: Secondary | ICD-10-CM | POA: Diagnosis not present

## 2021-02-04 DIAGNOSIS — E1151 Type 2 diabetes mellitus with diabetic peripheral angiopathy without gangrene: Secondary | ICD-10-CM | POA: Diagnosis not present

## 2021-02-04 DIAGNOSIS — K224 Dyskinesia of esophagus: Secondary | ICD-10-CM | POA: Diagnosis not present

## 2021-02-04 DIAGNOSIS — I152 Hypertension secondary to endocrine disorders: Secondary | ICD-10-CM | POA: Diagnosis not present

## 2021-02-04 DIAGNOSIS — E785 Hyperlipidemia, unspecified: Secondary | ICD-10-CM | POA: Diagnosis not present

## 2021-02-04 DIAGNOSIS — F411 Generalized anxiety disorder: Secondary | ICD-10-CM | POA: Diagnosis not present

## 2021-02-04 DIAGNOSIS — E1169 Type 2 diabetes mellitus with other specified complication: Secondary | ICD-10-CM | POA: Diagnosis not present

## 2021-02-04 DIAGNOSIS — Z6841 Body Mass Index (BMI) 40.0 and over, adult: Secondary | ICD-10-CM | POA: Diagnosis not present

## 2021-02-15 NOTE — Progress Notes (Signed)
Not seen

## 2021-02-18 ENCOUNTER — Telehealth: Payer: Self-pay | Admitting: Family Medicine

## 2021-02-18 DIAGNOSIS — E1169 Type 2 diabetes mellitus with other specified complication: Secondary | ICD-10-CM

## 2021-02-18 MED ORDER — METFORMIN HCL ER 500 MG PO TB24: 500.0000 mg | ORAL_TABLET | Freq: Every day | ORAL | 1 refills | Status: DC

## 2021-02-18 NOTE — Telephone Encounter (Signed)
Patient called about metformin, she says pharmacy asked if it an be changed to slow release and she wants to now if she should take it morning or night. Please call back

## 2021-02-20 ENCOUNTER — Telehealth: Payer: Self-pay | Admitting: Family Medicine

## 2021-02-20 NOTE — Telephone Encounter (Signed)
Patient called in wanting to know if she can switch her office appt tomorrow to a virtual visit. With Dr Brita Romp.  I didn't have that available. Please call back

## 2021-02-20 NOTE — Progress Notes (Signed)
  Telephone Visit    Virtual Visit via Video Note   This visit type was conducted due to national recommendations for restrictions regarding the COVID-19 Pandemic (e.g. social distancing) in an effort to limit this patient's exposure and mitigate transmission in our community. This patient is at least at moderate risk for complications without adequate follow up. This format is felt to be most appropriate for this patient at this time. Physical exam was limited by quality of the video and audio technology used for the visit.    Patient location: home Provider location: [facility] Persons involved in the visit: patient, provider  I discussed the limitations of evaluation and management by telemedicine and the availability of in person appointments. The patient expressed understanding and agreed to proceed.  Interactive audio and video communications were attempted, although failed due to patient's inability to connect to video. Continued visit with audio only interaction with patient agreement.   Patient: Christina Rubio   DOB: 05/25/1950   70 y.o. Female  MRN: [id number] Visit Date: 02/21/2021  Today's healthcare provider:  , MD   Chief Complaint  Patient presents with   Follow-up   Subjective    HPI  Anxiety, Follow-up  She was last seen for anxiety 2 months ago. Changes made at last visit include Continue LCSW CCM Increase zoloft to 200 mg daily Continue low dose Xanax - will not increase # of pills per day Stopped wellbutrin as it was not helpful. Reports that her psychiatrist prescribed Wellbutrin last week. She is sleeping better.   She reports excellent compliance with treatment. She reports excellent tolerance of treatment. She is not having side effects.   She feels her anxiety is mild and Improved since last visit.  Psychiatrist has taken over all of her psych meds. She is trying to cut back on Xanax - using it rarely  now.  Symptoms: No chest pain No difficulty concentrating  No dizziness No fatigue  No feelings of losing control No insomnia  No irritable No palpitations  No panic attacks No racing thoughts  No shortness of breath No sweating  No tremors/shakes    GAD-7 Results GAD-7 Generalized Anxiety Disorder Screening Tool 02/21/2021 12/04/2020 01/13/2019  1. Feeling Nervous, Anxious, or on Edge 1 3 2  2. Not Being Able to Stop or Control Worrying 1 3 3  3. Worrying Too Much About Different Things 1 3 3  4. Trouble Relaxing 0 3 3  5. Being So Restless it's Hard To Sit Still 0 0 0  6. Becoming Easily Annoyed or Irritable 1 1 1  7. Feeling Afraid As If Something Awful Might Happen 1 1 2  Total GAD-7 Score 5 14 14  Difficulty At Work, Home, or Getting  Along With Others? Somewhat difficult - Somewhat difficult    PHQ-9 Scores PHQ9 SCORE ONLY 12/04/2020 12/04/2020 05/22/2020  PHQ-9 Total Score 16 2 11    Lymphedema is improving. HH is still being helpful at home with this. ---------------------------------------------------------------------------------------------------    Medications: Outpatient Medications Prior to Visit  Medication Sig   ALPRAZolam (XANAX) 0.5 MG tablet TAKE 1 TABLET BY MOUTH EVERY DAY AS NEEDED FOR ANXIETY.   atorvastatin (LIPITOR) 20 MG tablet Take 1 tablet (20 mg total) by mouth daily.   blood glucose meter kit and supplies Dispense based on patient and insurance preference. Use once daily to check for fasting blood sugars. (FOR ICD-10 E10.9, E11.9).   famotidine (PEPCID) 20 MG tablet TAKE ONE TABLET BY   Telephone Visit    Virtual Visit via Video Note   This visit type was conducted due to national recommendations for restrictions regarding the COVID-19 Pandemic (e.g. social distancing) in an effort to limit this patient's exposure and mitigate transmission in our community. This patient is at least at moderate risk for complications without adequate follow up. This format is felt to be most appropriate for this patient at this time. Physical exam was limited by quality of the video and audio technology used for the visit.    Patient location: home Provider location: [place] involved in the visit: patient, provider  I discussed the limitations of evaluation and management by telemedicine and the availability of in person appointments. The patient expressed understanding and agreed to proceed.  Interactive audio and video communications were attempted, although failed due to patient's inability to connect to video. Continued visit with audio only interaction with patient agreement.   Patient: Christina Rubio   DOB: [birth year]   70 y.o. Female  MRN: [id number] Visit Date: 02/21/2021  Today's healthcare provider: Lavon Paganini, MD   Chief Complaint  Patient presents with   Follow-up   Subjective    HPI  Anxiety, Follow-up  She was last seen for anxiety 2 months ago. Changes made at last visit include Continue LCSW CCM Increase zoloft to 200 mg daily Continue low dose Xanax - will not increase # of pills per day Stopped wellbutrin as it was not helpful. Reports that her psychiatrist prescribed Wellbutrin last week. She is sleeping better.   She reports excellent compliance with treatment. She reports excellent tolerance of treatment. She is not having side effects.   She feels her anxiety is mild and Improved since last visit.  Psychiatrist has taken over all of her psych meds. She is trying to cut back on Xanax - using it rarely  now.  Symptoms: No chest pain No difficulty concentrating  No dizziness No fatigue  No feelings of losing control No insomnia  No irritable No palpitations  No panic attacks No racing thoughts  No shortness of breath No sweating  No tremors/shakes    GAD-7 Results GAD-7 Generalized Anxiety Disorder Screening Tool 02/21/2021 12/04/2020 01/13/2019  1. Feeling Nervous, Anxious, or on Edge _0 2. Not Being Able to Stop or Control Worrying _1 3. Worrying Too Much About Different Things _2 4. Trouble Relaxing 0 3 3  5. Being So Restless it's Hard To Sit Still 0 0 0  6. Becoming Easily Annoyed or Irritable _3 7. Feeling Afraid As If Something Awful Might Happen _4 Total GAD-7 Score _5 Difficulty At Work, Home, or Getting  Along With Others? Somewhat difficult - Somewhat difficult    PHQ-9 Scores PHQ9 SCORE ONLY 12/04/2020 12/04/2020 05/22/2020  PHQ-9 Total Score _6 Lymphedema is improving. HH is still being helpful at home with this. ---------------------------------------------------------------------------------------------------    Medications: Outpatient Medications Prior to Visit  Medication Sig   ALPRAZolam (XANAX) 0.5 MG tablet TAKE 1 TABLET BY MOUTH EVERY DAY AS NEEDED FOR ANXIETY.   atorvastatin (LIPITOR) 20 MG tablet Take 1 tablet (20 mg total) by mouth daily.   blood glucose meter kit and supplies Dispense based on patient and insurance preference. Use once daily to check for fasting blood sugars. (FOR ICD-10 E10.9, E11.9).   famotidine (PEPCID) 20 MG tablet TAKE ONE TABLET BY

## 2021-02-20 NOTE — Telephone Encounter (Signed)
Appt changed to virtual patient advised.

## 2021-02-21 ENCOUNTER — Encounter: Payer: Self-pay | Admitting: Family Medicine

## 2021-02-21 ENCOUNTER — Telehealth (INDEPENDENT_AMBULATORY_CARE_PROVIDER_SITE_OTHER): Payer: Medicare Other | Admitting: Family Medicine

## 2021-02-21 ENCOUNTER — Other Ambulatory Visit: Payer: Self-pay

## 2021-02-21 DIAGNOSIS — E1159 Type 2 diabetes mellitus with other circulatory complications: Secondary | ICD-10-CM

## 2021-02-21 DIAGNOSIS — F5105 Insomnia due to other mental disorder: Secondary | ICD-10-CM | POA: Diagnosis not present

## 2021-02-21 DIAGNOSIS — I152 Hypertension secondary to endocrine disorders: Secondary | ICD-10-CM

## 2021-02-21 DIAGNOSIS — I890 Lymphedema, not elsewhere classified: Secondary | ICD-10-CM

## 2021-02-21 DIAGNOSIS — F410 Panic disorder [episodic paroxysmal anxiety] without agoraphobia: Secondary | ICD-10-CM | POA: Diagnosis not present

## 2021-02-21 DIAGNOSIS — F331 Major depressive disorder, recurrent, moderate: Secondary | ICD-10-CM | POA: Diagnosis not present

## 2021-02-21 DIAGNOSIS — F411 Generalized anxiety disorder: Secondary | ICD-10-CM

## 2021-02-21 NOTE — Assessment & Plan Note (Signed)
Improving  Now followed by psych No changes to meds - now managed by psych

## 2021-02-21 NOTE — Assessment & Plan Note (Signed)
Well controlled on home readings No changes today

## 2021-02-21 NOTE — Assessment & Plan Note (Signed)
Reports that swelling is getting better

## 2021-02-21 NOTE — Assessment & Plan Note (Signed)
Doing well Now followed by psych We are no longer Rx'ing her psych meds

## 2021-03-04 ENCOUNTER — Ambulatory Visit (INDEPENDENT_AMBULATORY_CARE_PROVIDER_SITE_OTHER): Payer: Medicare Other

## 2021-03-04 DIAGNOSIS — Z0000 Encounter for general adult medical examination without abnormal findings: Secondary | ICD-10-CM | POA: Diagnosis not present

## 2021-03-04 DIAGNOSIS — Z7689 Persons encountering health services in other specified circumstances: Secondary | ICD-10-CM

## 2021-03-04 NOTE — Patient Instructions (Signed)
Christina Rubio , Thank you for taking time to come for your Medicare Wellness Visit. I appreciate your ongoing commitment to your health goals. Please review the following plan we discussed and let me know if I can assist you in the future.   Screening recommendations/referrals: Colonoscopy: had in [place] around 7 years ago Mammogram: had in [facility]  Bone Density: had in [facility] Recommended yearly ophthalmology/optometry visit for glaucoma screening and checkup Recommended yearly dental visit for hygiene and checkup  Vaccinations: Influenza vaccine: 12/04/20 Pneumococcal vaccine: 07/15/19 Tdap vaccine: 05/24/18 Shingles vaccine: 11/17/17, need second shot   Covid-19:05/20/19, 06/14/19  Advanced directives: no.declined  Conditions/risks identified: no  Next appointment: Follow up in one year for your annual wellness visit    Preventive Care 11 Years and Older, Female Preventive care refers to lifestyle choices and visits with your health care provider that can promote health and wellness. What does preventive care include? A yearly physical exam. This is also called an annual well check. Dental exams once or twice a year. Routine eye exams. Ask your health care provider how often you should have your eyes checked. Personal lifestyle choices, including: Daily care of your teeth and gums. Regular physical activity. Eating a healthy diet. Avoiding tobacco and drug use. Limiting alcohol use. Practicing safe sex. Taking low-dose aspirin every day. Taking vitamin and mineral supplements as recommended by your health care provider. What happens during an annual well check? The services and screenings done by your health care provider during your annual well check will depend on your age, overall health, lifestyle risk factors, and family history of disease. Counseling  Your health care provider may ask you questions about your: Alcohol use. Tobacco use. Drug use. Emotional well-being. Home and  relationship well-being. Sexual activity. Eating habits. History of falls. Memory and ability to understand (cognition). Work and work Statistician. Reproductive health. Screening  You may have the following tests or measurements: Height, weight, and BMI. Blood pressure. Lipid and cholesterol levels. These may be checked every 5 years, or more frequently if you are over 56 years old. Skin check. Lung cancer screening. You may have this screening every year starting at age 25 if you have a 30-pack-year history of smoking and currently smoke or have quit within the past 15 years. Fecal occult blood test (FOBT) of the stool. You may have this test every year starting at age 36. Flexible sigmoidoscopy or colonoscopy. You may have a sigmoidoscopy every 5 years or a colonoscopy every 10 years starting at age 57. Hepatitis C blood test. Hepatitis B blood test. Sexually transmitted disease (STD) testing. Diabetes screening. This is done by checking your blood sugar (glucose) after you have not eaten for a while (fasting). You may have this done every 1-3 years. Bone density scan. This is done to screen for osteoporosis. You may have this done starting at age 84. Mammogram. This may be done every 1-2 years. Talk to your health care provider about how often you should have regular mammograms. Talk with your health care provider about your test results, treatment options, and if necessary, the need for more tests. Vaccines  Your health care provider may recommend certain vaccines, such as: Influenza vaccine. This is recommended every year. Tetanus, diphtheria, and acellular pertussis (Tdap, Td) vaccine. You may need a Td booster every 10 years. Zoster vaccine. You may need this after age 36. Pneumococcal 13-valent conjugate (PCV13) vaccine. One dose is recommended after age 3. Pneumococcal polysaccharide (PPSV23) vaccine. One dose is recommended  after age 39. Talk to your health care provider  about which screenings and vaccines you need and how often you need them. This information is not intended to replace advice given to you by your health care provider. Make sure you discuss any questions you have with your health care provider. Document Released: 04/06/2015 Document Revised: 11/28/2015 Document Reviewed: 01/09/2015 Elsevier Interactive Patient Education  2017 [place] [employer] Prevention in the Home Falls can cause injuries. They can happen to people of all ages. There are many things you can do to make your home safe and to help prevent falls. What can I do on the outside of my home? Regularly fix the edges of walkways and driveways and fix any cracks. Remove anything that might make you trip as you walk through a door, such as a raised step or threshold. Trim any bushes or trees on the path to your home. Use bright outdoor lighting. Clear any walking paths of anything that might make someone trip, such as rocks or tools. Regularly check to see if handrails are loose or broken. Make sure that both sides of any steps have handrails. Any raised decks and porches should have guardrails on the edges. Have any leaves, snow, or ice cleared regularly. Use sand or salt on walking paths during winter. Clean up any spills in your garage right away. This includes oil or grease spills. What can I do in the bathroom? Use night lights. Install grab bars by the toilet and in the tub and shower. Do not use towel bars as grab bars. Use non-skid mats or decals in the tub or shower. If you need to sit down in the shower, use a plastic, non-slip stool. Keep the floor dry. Clean up any water that spills on the floor as soon as it happens. Remove soap buildup in the tub or shower regularly. Attach bath mats securely with double-sided non-slip rug tape. Do not have throw rugs and other things on the floor that can make you trip. What can I do in the bedroom? Use night lights. Make sure  that you have a light by your bed that is easy to reach. Do not use any sheets or blankets that are too big for your bed. They should not hang down onto the floor. Have a firm chair that has side arms. You can use this for support while you get dressed. Do not have throw rugs and other things on the floor that can make you trip. What can I do in the kitchen? Clean up any spills right away. Avoid walking on wet floors. Keep items that you use a lot in easy-to-reach places. If you need to reach something above you, use a strong step stool that has a grab bar. Keep electrical cords out of the way. Do not use floor polish or wax that makes floors slippery. If you must use wax, use non-skid floor wax. Do not have throw rugs and other things on the floor that can make you trip. What can I do with my stairs? Do not leave any items on the stairs. Make sure that there are handrails on both sides of the stairs and use them. Fix handrails that are broken or loose. Make sure that handrails are as long as the stairways. Check any carpeting to make sure that it is firmly attached to the stairs. Fix any carpet that is loose or worn. Avoid having throw rugs at the top or bottom of the stairs. If you  do have throw rugs, attach them to the floor with carpet tape. Make sure that you have a light switch at the top of the stairs and the bottom of the stairs. If you do not have them, ask someone to add them for you. What else can I do to help prevent falls? Wear shoes that: Do not have high heels. Have rubber bottoms. Are comfortable and fit you well. Are closed at the toe. Do not wear sandals. If you use a stepladder: Make sure that it is fully opened. Do not climb a closed stepladder. Make sure that both sides of the stepladder are locked into place. Ask someone to hold it for you, if possible. Clearly mark and make sure that you can see: Any grab bars or handrails. First and last steps. Where the edge of  each step is. Use tools that help you move around (mobility aids) if they are needed. These include: Canes. Walkers. Scooters. Crutches. Turn on the lights when you go into a dark area. Replace any light bulbs as soon as they burn out. Set up your furniture so you have a clear path. Avoid moving your furniture around. If any of your floors are uneven, fix them. If there are any pets around you, be aware of where they are. Review your medicines with your doctor. Some medicines can make you feel dizzy. This can increase your chance of falling. Ask your doctor what other things that you can do to help prevent falls. This information is not intended to replace advice given to you by your health care provider. Make sure you discuss any questions you have with your health care provider. Document Released: 01/04/2009 Document Revised: 08/16/2015 Document Reviewed: 04/14/2014 Elsevier Interactive Patient Education  2017 [employer].

## 2021-03-04 NOTE — Addendum Note (Signed)
Addended by: Dionisio David on: 03/04/2021 11:36 AM   Modules accepted: Orders

## 2021-03-04 NOTE — Progress Notes (Signed)
Virtual Visit via Telephone Note  I connected with  Christina Rubio on 03/04/21 at 11:00 AM EST by telephone and verified that I am speaking with the correct person using two identifiers.  Location: Patient: home Provider: [facility] Persons participating in the virtual visit: [place]   I discussed the limitations, risks, security and privacy concerns of performing an evaluation and management service by telephone and the availability of in person appointments. The patient expressed understanding and agreed to proceed.  Interactive audio and video telecommunications were attempted between this nurse and patient, however failed, due to patient having technical difficulties OR patient did not have access to video capability.  We continued and completed visit with audio only.  Some vital signs may be absent or patient reported.   Dionisio David, LPN    Subjective:   Christina Rubio is a 70 y.o. female who presents for Medicare Annual (Subsequent) preventive examination.  Review of Systems           Objective:    There were no vitals filed for this visit. There is no height or weight on file to calculate BMI.  Advanced Directives 07/06/2019 09/11/2018 05/11/2018 03/14/2016 03/07/2016 01/21/2016 01/16/2016  Does Patient Have a Medical Advance Directive? _0  No No  Would patient like information on creating a medical advance directive? No - Patient declined - Yes (MAU/Ambulatory/Procedural Areas - Information given) - - No - patient declined information No - patient declined information    Current Medications (verified) Outpatient Encounter Medications as of 03/04/2021  Medication Sig   ALPRAZolam (XANAX) 0.5 MG tablet TAKE 1 TABLET BY MOUTH EVERY DAY AS NEEDED FOR ANXIETY.   atorvastatin (LIPITOR) 20 MG tablet Take 1 tablet (20 mg total) by mouth daily.   blood glucose meter kit and supplies Dispense based on patient and insurance preference. Use once daily  to check for fasting blood sugars. (FOR ICD-10 E10.9, E11.9).   famotidine (PEPCID) 20 MG tablet TAKE ONE TABLET BY MOUTH TWICE A DAY   levothyroxine (SYNTHROID) 100 MCG tablet TAKE ONE TABLET EVERY DAY   loperamide (IMODIUM A-D) 2 MG tablet Take 1 tablet (2 mg total) by mouth 4 (four) times daily as needed for diarrhea or loose stools.   metFORMIN (GLUCOPHAGE XR) 500 MG 24 hr tablet Take 1 tablet (500 mg total) by mouth daily with breakfast.   metoprolol succinate (TOPROL-XL) 50 MG 24 hr tablet TAKE 1 TABLET BY MOUTH DAILY WITH OR IMMEDIATELY FOLLOWING A MEAL   Multiple Vitamins-Minerals (PRESERVISION AREDS 2) CAPS Take 1 tablet by mouth 2 (two) times daily.   ondansetron (ZOFRAN ODT) 4 MG disintegrating tablet Take 1 tablet (4 mg total) by mouth every 8 (eight) hours as needed.   Probiotic Product (PROBIOTIC PO) Take by mouth daily.    sertraline (ZOLOFT) 100 MG tablet Take 2 tablets (200 mg total) by mouth daily.   triamcinolone ointment (KENALOG) 0.5 % APPLY TO AFFECTED AREAS TOPICALLY TWICE DAILY   valsartan-hydrochlorothiazide (DIOVAN-HCT) 160-25 MG tablet TAKE 1 TABLET BY MOUTH NIGHTLY   gentamicin (GARAMYCIN) 0.3 % ophthalmic solution Place 1 drop into both eyes every 4 (four) hours while awake. (Patient not taking: Reported on 03/04/2021)   No facility-administered encounter medications on file as of 03/04/2021.    Allergies (verified) Shellfish allergy, Epinephrine, Penicillins, and Strawberry extract   History: Past Medical History:  Diagnosis Date   Anxiety    Arthritis    Depression    Hyperlipidemia  Hypertension    Neuromuscular disorder ([place])    Osteoporosis    PONV (postoperative nausea and vomiting)    Skin cancer    non melanoma   Spinal stenosis    Thyroid disease    hyperthyroid s/p partial thyroidectomy   Past Surgical History:  Procedure Laterality Date   CESAREAN SECTION     CHOLECYSTECTOMY     COLON SURGERY     FEMUR IM NAIL Right 01/09/2016    Procedure: INTRAMEDULLARY (IM) RETROGRADE FEMORAL NAILING RIGHT DISTAL FEMUR;  Surgeon: Dorna Leitz, MD;  Location: [place];  Service: Orthopedics;  Laterality: Right;   LAPAROSCOPIC GASTRIC SLEEVE RESECTION     Right thyroidectomy     TONSILLECTOMY     Family History  Problem Relation Age of Onset   Lung cancer Mother    Prostate cancer Father    Depression Daughter    Anemia Daughter        PNH   Thyroid cancer Maternal Uncle    Lung cancer Paternal Uncle    Stroke Maternal Grandmother    Stroke Maternal Grandfather    Stroke Paternal Grandmother    Healthy Sister    Healthy Brother    Healthy Sister    Emphysema Paternal Grandfather    Colon cancer Neg Hx    Breast cancer Neg Hx    Ovarian cancer Neg Hx    Cervical cancer Neg Hx    Social History   Socioeconomic History   Marital status: Widowed    Spouse name: Not on file   Number of children: 1   Years of education: 2 master's degrees   Highest education level: Master's degree (e.g., MA, MS, MEng, MEd, MSW, MBA)  Occupational History   Occupation: retired    Comment: Proofreader  Tobacco Use   Smoking status: Never   Smokeless tobacco: Never  Vaping Use   Vaping Use: Never used  Substance and Sexual Activity   Alcohol use: Yes    Comment: 1-2 drinks per year   Drug use: No   Sexual activity: Never  Other Topics Concern   Not on file  Social History Narrative   Not on file   Social Determinants of Health   Financial Resource Strain: Medium Risk   Difficulty of Paying Living Expenses: Somewhat hard  Food Insecurity: No Food Insecurity   Worried About Charity fundraiser in the Last Year: Never true   Ran Out of Food in the Last Year: Never true  Transportation Needs: Unmet Transportation Needs   Lack of Transportation (Medical): Yes   Lack of Transportation (Non-Medical): Yes  Physical Activity: Insufficiently Active   Days of Exercise per Week: 3 days   Minutes of Exercise per Session: 10 min   Stress: No Stress Concern Present   Feeling of Stress : Only a little  Social Connections: Socially Isolated   Frequency of Communication with Friends and Family: More than three times a week   Frequency of Social Gatherings with Friends and Family: Once a week   Attends Religious Services: Never   Marine scientist or Organizations: No   Attends Archivist Meetings: Never   Marital Status: Widowed    Tobacco Counseling Counseling given: Not Answered   Clinical Intake:  Pre-visit preparation completed: Yes  Pain : No/denies pain     Nutritional Status: BMI > 30  Obese Nutritional Risks: None Diabetes: Yes CBG done?: No Did pt. bring in CBG monitor from home?: No  How often do you need to have someone help you when you read instructions, pamphlets, or other written materials from your doctor or pharmacy?: 1 - Never  Diabetic?yes Nutrition Risk Assessment:  Has the patient had any N/V/D within the last 2 months?  Yes  Does the patient have any non-healing wounds?  No  Has the patient had any unintentional weight loss or weight gain?  No   Diabetes:  Is the patient diabetic?  Yes  If diabetic, was a CBG obtained today?  No  Did the patient bring in their glucometer from home?  No  How often do you monitor your CBG's? Every morning.   Financial Strains and Diabetes Management:  Are you having any financial strains with the device, your supplies or your medication? No .  Does the patient want to be seen by Chronic Care Management for management of their diabetes?  No  Would the patient like to be referred to a Nutritionist or for Diabetic Management?  No   Diabetic Exams:  Diabetic Eye Exam: Overdue for diabetic eye exam. Pt has been advised about the importance in completing this exam. A referral has been placed today. Message sent to referral coordinator for scheduling purposes. Advised pt to expect a call from office referred to regarding  appt.  Diabetic Foot Exam: . Pt has been advised about the importance in completing this exam.   Interpreter Needed?: No  Information entered by :: Kirke Shaggy, LPN   Activities of Daily Living In your present state of health, do you have any difficulty performing the following activities: 05/22/2020  Hearing? N  Vision? N  Difficulty concentrating or making decisions? Y  Walking or climbing stairs? Y  Dressing or bathing? Y  Doing errands, shopping? Y  Some recent data might be hidden    Patient Care Team: [name], MD as PCP - General (Family Medicine) Dorna Leitz, MD as Consulting Physician (Orthopedic Surgery) End, Harrell Gave, MD as Consulting Physician (Cardiology) Neldon Labella, RN as Case Manager [place], [place], LCSW as Social Worker  Indicate any recent [place] you may have received from other than [facility] providers in the past year (date may be approximate).     Assessment:   This is a routine wellness examination for [facility].  Hearing/Vision screen No results found.  Dietary issues and exercise activities discussed:     Goals Addressed             This Visit's Progress    DIET - EAT MORE FRUITS AND VEGETABLES         Depression Screen PHQ 2/9 Scores 03/04/2021 12/04/2020 12/04/2020 05/22/2020 04/24/2020 07/06/2019 01/13/2019  PHQ - 2 Score _0 PHQ- 9 Score 1 16 - _1 Fall Risk Fall Risk  07/20/2020 05/22/2020 07/06/2019 05/11/2018 05/28/2017  Falls in the past year? - 0 1 0 No  Number falls in past yr: - 0 0 - -  Injury with Fall? - 0 0 - -  Risk for fall due to : History of fall(s);Medication side effect;Impaired balance/gait;Impaired mobility - - - -  Follow up Falls prevention discussed - Falls prevention discussed - -    FALL RISK PREVENTION PERTAINING TO THE HOME:  Any stairs in or around the home? Yes  If so, are there any without handrails? No  Home free of loose throw rugs in walkways, pet beds,  electrical cords, etc? Yes  Adequate lighting  in your home to reduce risk of falls? Yes   ASSISTIVE DEVICES UTILIZED TO PREVENT FALLS:  Life alert? No  Use of a cane, walker or w/c? Yes  Grab bars in the bathroom? Yes  Shower chair or bench in shower? Yes  Elevated toilet seat or a handicapped toilet? Yes    Cognitive Function:Normal cognitive status assessed by direct observation by this Nurse Health Advisor. No abnormalities found.       6CIT Screen 07/06/2019 05/11/2018  What Year? 0 points 0 points  What month? 0 points 0 points  What time? 0 points 0 points  Count back from 20 0 points 0 points  Months in reverse 0 points 0 points  Repeat phrase 0 points 0 points  Total Score 0 0    Immunizations Immunization History  Administered Date(s) Administered   Fluad Quad(high Dose 65+) 01/06/2019, 12/04/2020   Influenza, High Dose Seasonal PF 01/29/2017, 04/20/2018   Influenza,inj,Quad PF,6+ Mos 01/26/2020   PFIZER(Purple Top)SARS-COV-2 Vaccination 05/20/2019, 06/14/2019   Pneumococcal Conjugate-13 05/28/2017   Pneumococcal Polysaccharide-23 07/15/2019   Td 05/24/2018   Zoster Recombinat (Shingrix) 11/17/2017    TDAP status: Up to date  Flu Vaccine status: Up to date  Pneumococcal vaccine status: Up to date  Covid-19 vaccine status: Completed vaccines  Qualifies for Shingles Vaccine? Yes   Zostavax completed No   Shingrix Completed?: No.    Education has been provided regarding the importance of this vaccine. Patient has been advised to call insurance company to determine out of pocket expense if they have not yet received this vaccine. Advised may also receive vaccine at local pharmacy or Health Dept. Verbalized acceptance and understanding.  Screening Tests Health Maintenance  Topic Date Due   FOOT EXAM  Never done   OPHTHALMOLOGY EXAM  Never done   COLONOSCOPY (Pts 45-59yr Insurance coverage will need to be confirmed)  Never done   MAMMOGRAM  Never done   DEXA  SCAN  Never done   Zoster Vaccines- Shingrix (2 of 2) 01/12/2018   COVID-19 Vaccine (3 - Booster for Pfizer series) 08/09/2019   HEMOGLOBIN A1C  06/03/2021   TETANUS/TDAP  05/23/2028   Pneumonia Vaccine 70 Years old  Completed   INFLUENZA VACCINE  Completed   Hepatitis C Screening  Completed   HPV VACCINES  Aged Out    Health Maintenance  Health Maintenance Due  Topic Date Due   FOOT EXAM  Never done   OPHTHALMOLOGY EXAM  Never done   COLONOSCOPY (Pts 45-414yrInsurance coverage will need to be confirmed)  Never done   MAMMOGRAM  Never done   DEXA SCAN  Never done   Zoster Vaccines- Shingrix (2 of 2) 01/12/2018   COVID-19 Vaccine (3 - Booster for PfJackeries) 08/09/2019    Colorectal cancer screening: Type of screening: Colonoscopy. Completed in [place] around 7 years ago. Repeat every 10 years  Mammogram status: Completed in [place], declines referral. Repeat every year  Bone Density status: Completed in [place], declines referral. Results reflect: Bone density results: NORMAL. Repeat every 5 years.  Lung Cancer Screening: (Low Dose CT Chest recommended if Age 636-80ears, 30 pack-year currently smoking OR have quit w/in 15years.) does not qualify.   Additional Screening:  Hepatitis C Screening: does qualify; Completed no  Vision Screening: Recommended annual ophthalmology exams for early detection of glaucoma and other disorders of the eye. Is the patient up to date with their annual eye exam?  Yes  Who is the provider or what  is the name of the office in which the patient attends annual eye exams? Doesn't remember MD's name If pt is not established with a provider, would they like to be referred to a provider to establish care? No .   Dental Screening: Recommended annual dental exams for proper oral hygiene  Community Resource Referral / Chronic Care Management: CRR required this visit?  Yes   CCM required this visit?  No      Plan:     I have personally reviewed and noted  the following in the patient's chart:   Medical and social history Use of alcohol, tobacco or illicit drugs  Current medications and supplements including opioid prescriptions.  Functional ability and status Nutritional status Physical activity Advanced directives List of other physicians Hospitalizations, surgeries, and ER visits in previous 12 months Vitals Screenings to include cognitive, depression, and falls Referrals and appointments  In addition, I have reviewed and discussed with patient certain preventive protocols, quality metrics, and best practice recommendations. A written personalized care plan for preventive services as well as general preventive health recommendations were provided to patient.     Dionisio David, LPN   [id number]   Nurse Notes: none

## 2021-03-05 ENCOUNTER — Telehealth: Payer: Self-pay | Admitting: *Deleted

## 2021-03-05 NOTE — Chronic Care Management (AMB) (Signed)
°  Chronic Care Management   Note  03/05/2021 Name: Christina Rubio MRN: [id number] DOB: [birth year]  Christina Rubio is a 70 y.o. year old female who is a primary care patient of Bacigalupo, Dionne Bucy, MD. Christina Rubio is currently enrolled in care management services. An additional referral for Licensed Clinical SW was placed.   Follow up plan: Telephone appointment with care management team member scheduled for: 03/13/2021  Julian Hy, [place] [facility]  Direct Dial: [phone]

## 2021-03-06 DIAGNOSIS — F411 Generalized anxiety disorder: Secondary | ICD-10-CM | POA: Diagnosis not present

## 2021-03-06 DIAGNOSIS — E1169 Type 2 diabetes mellitus with other specified complication: Secondary | ICD-10-CM | POA: Diagnosis not present

## 2021-03-06 DIAGNOSIS — K224 Dyskinesia of esophagus: Secondary | ICD-10-CM | POA: Diagnosis not present

## 2021-03-06 DIAGNOSIS — R32 Unspecified urinary incontinence: Secondary | ICD-10-CM | POA: Diagnosis not present

## 2021-03-06 DIAGNOSIS — E1151 Type 2 diabetes mellitus with diabetic peripheral angiopathy without gangrene: Secondary | ICD-10-CM | POA: Diagnosis not present

## 2021-03-06 DIAGNOSIS — E034 Atrophy of thyroid (acquired): Secondary | ICD-10-CM | POA: Diagnosis not present

## 2021-03-06 DIAGNOSIS — I152 Hypertension secondary to endocrine disorders: Secondary | ICD-10-CM | POA: Diagnosis not present

## 2021-03-06 DIAGNOSIS — I872 Venous insufficiency (chronic) (peripheral): Secondary | ICD-10-CM | POA: Diagnosis not present

## 2021-03-06 DIAGNOSIS — I890 Lymphedema, not elsewhere classified: Secondary | ICD-10-CM | POA: Diagnosis not present

## 2021-03-06 DIAGNOSIS — E039 Hypothyroidism, unspecified: Secondary | ICD-10-CM | POA: Diagnosis not present

## 2021-03-06 DIAGNOSIS — E1159 Type 2 diabetes mellitus with other circulatory complications: Secondary | ICD-10-CM | POA: Diagnosis not present

## 2021-03-06 DIAGNOSIS — S71111D Laceration without foreign body, right thigh, subsequent encounter: Secondary | ICD-10-CM | POA: Diagnosis not present

## 2021-03-06 DIAGNOSIS — E785 Hyperlipidemia, unspecified: Secondary | ICD-10-CM | POA: Diagnosis not present

## 2021-03-06 DIAGNOSIS — F331 Major depressive disorder, recurrent, moderate: Secondary | ICD-10-CM | POA: Diagnosis not present

## 2021-03-06 DIAGNOSIS — Z6841 Body Mass Index (BMI) 40.0 and over, adult: Secondary | ICD-10-CM | POA: Diagnosis not present

## 2021-03-11 ENCOUNTER — Other Ambulatory Visit: Payer: Self-pay | Admitting: Family Medicine

## 2021-03-13 ENCOUNTER — Telehealth: Payer: Medicare Other

## 2021-03-26 ENCOUNTER — Telehealth: Payer: Self-pay | Admitting: *Deleted

## 2021-03-26 NOTE — Telephone Encounter (Signed)
° °  Telephone encounter was:  Unsuccessful.  03/26/2021 Name: Christina Rubio MRN: [id number] DOB: 05-21-1950  Unsuccessful outbound call made today to assist with:  Financial Difficulties related to due to daughter being sick  Outreach Attempt:  1st Attempt  A HIPAA compliant voice message was left requesting a return call.  Instructed patient to call back   Instructed patient to call back at [phone]  at their earliest convenience.   [place], Care Management  [phone] [address] , [place] [address] Email : [email or web address]

## 2021-03-29 ENCOUNTER — Ambulatory Visit: Payer: Self-pay | Admitting: *Deleted

## 2021-03-29 NOTE — Telephone Encounter (Signed)
2nd attempt, Patient called, left VM to return the call to the office to discuss symptoms with a nurse.  

## 2021-03-29 NOTE — Telephone Encounter (Signed)
Chief Complaint: vomiting and diarrhea since 03/27/21 Symptoms: diarrhea after eating , vomiting now stopped mild abdominal cramping  Frequency: since 03/27/21 Pertinent Negatives: Patient denies dizziness, lightheadedness , no fever  Disposition: [] ED /[] Urgent Care (no appt availability in office) / [] Appointment(In office/virtual)/ [x]  [facility]/ [] Home Care/ [] Refused Recommended Disposition /[] [facility]/ []  Follow-up with PCP Additional Notes:   No transportation at this time. Recommended E visit via . Reports trying imodium x 4 and continues with diarrhea. Stopped metformin due to vomiting . Blood sugars between 105-120 now . Will restart metformin today . Please advise .    Reason for Disposition  [1] MILD or MODERATE vomiting AND [2] present > 48 hours (2 days) (Exception: mild vomiting with associated diarrhea)  [1] MODERATE diarrhea (e.g., 4-6 times / day more than normal) AND [2] present > 48 hours (2 days)  Answer Assessment - Initial Assessment Questions 1. VOMITING SEVERITY: "How many times have you vomited in the past 24 hours?"     - MILD:  1 - 2 times/day    - MODERATE: 3 - 5 times/day, decreased oral intake without significant weight loss or symptoms of dehydration    - SEVERE: 6 or more times/day, vomits everything or nearly everything, with significant weight loss, symptoms of dehydration      None  2. ONSET: "When did the vomiting begin?"      Day before yesterday  3. FLUIDS: "What fluids or food have you vomited up today?" "Have you been able to keep any fluids down?"     Abel to tolerate food and fluids but causes diarrhea  4. ABDOMINAL PAIN: "Are your having any abdominal pain?" If yes : "How bad is it and what does it feel like?" (e.g., crampy, dull, intermittent, constant)      Small amount of crampy 5. DIARRHEA: "Is there any diarrhea?" If Yes, ask: "How many times today?"      Yes almost immediately after eating  6.  CONTACTS: "Is there anyone else in the family with the same symptoms?"      No  7. CAUSE: "What do you think is causing your vomiting?"     Not sure  8. HYDRATION STATUS: "Any signs of dehydration?" (e.g., dry mouth [not only dry lips], too weak to stand) "When did you last urinate?"     No  9. OTHER SYMPTOMS: "Do you have any other symptoms?" (e.g., fever, headache, vertigo, vomiting blood or coffee grounds, recent head injury)     No 10. PREGNANCY: "Is there any chance you are pregnant?" "When was your last menstrual period?"       na  Answer Assessment - Initial Assessment Questions 1. DIARRHEA SEVERITY: "How bad is the diarrhea?" "How many more stools have you had in the past 24 hours than normal?"    - NO DIARRHEA (SCALE 0)   - MILD (SCALE 1-3): Few loose or mushy BMs; increase of 1-3 stools over normal daily number of stools; mild increase in ostomy output.   -  MODERATE (SCALE 4-7): Increase of 4-6 stools daily over normal; moderate increase in ostomy output. * SEVERE (SCALE 8-10; OR 'WORST POSSIBLE'): Increase of 7 or more stools daily over normal; moderate increase in ostomy output; incontinence.     Every time eating and without eating did not give # of times  2. ONSET: "When did the diarrhea begin?"      Day before yesterday 03/27/21 3. BM  CONSISTENCY: "How loose or watery is the diarrhea?"      na 4. VOMITING: "Are you also vomiting?" If Yes, ask: "How many times in the past 24 hours?"      Yes 5. ABDOMINAL PAIN: "Are you having any abdominal pain?" If Yes, ask: "What does it feel like?" (e.g., crampy, dull, intermittent, constant)      Some cramping  6. ABDOMINAL PAIN SEVERITY: If present, ask: "How bad is the pain?"  (e.g., Scale 1-10; mild, moderate, or severe)   - MILD (1-3): doesn't interfere with normal activities, abdomen soft and not tender to touch    - MODERATE (4-7): interferes with normal activities or awakens from sleep, abdomen tender to touch    - SEVERE (8-10):  excruciating pain, doubled over, unable to do any normal activities       Yes some cramping  7. ORAL INTAKE: If vomiting, "Have you been able to drink liquids?" "How much liquids have you had in the past 24 hours?"     Yes but has diarrhea after eating urgently  8. HYDRATION: "Any signs of dehydration?" (e.g., dry mouth [not just dry lips], too weak to stand, dizziness, new weight loss) "When did you last urinate?"     No  9. EXPOSURE: "Have you traveled to a foreign country recently?" "Have you been exposed to anyone with diarrhea?" "Could you have eaten any food that was spoiled?"     na 10. ANTIBIOTIC USE: "Are you taking antibiotics now or have you taken antibiotics in the past 2 months?"       na 11. OTHER SYMPTOMS: "Do you have any other symptoms?" (e.g., fever, blood in stool)       na 12. PREGNANCY: "Is there any chance you are pregnant?" "When was your last menstrual period?"       na  Protocols used: Vomiting-A-AH, [facility]

## 2021-03-29 NOTE — Telephone Encounter (Signed)
Spoke with patient on the phone and reviewed over triage note as below from RN. Patient reports diarrhea x2 days and reports that at initial onset of symptoms she had vomiting but states that shortly resolved within hours of onset. Patient denies being around any sick contacts, recently traveling or change in eating habits/diet. Patient states that she has taken Immodium 5x in the past 48hrs and states that symptom of diarrhea is improving. I advised patient not to exceed the daily recommended dose for otc medication. I encouraged patient to continue to drink adequate fluids to stay hydrated and to avoid greasy or fried foods and try to follow more of BRAT diet during this time. I encouraged patient to schedule virtual visit through mychart so she can be evaluated by doctor through [facility] for her symptoms. Patient agreed and stated that she will arrange a virtual appt this evening if symptoms persist into the evening. KW

## 2021-03-29 NOTE — Telephone Encounter (Signed)
Pt has upset stomach, not abdominal pain, however she has vomited several times.  Best contact: [phone]       Attempted to reach pt. Left VM to call back to discuss symptoms.

## 2021-04-01 NOTE — Telephone Encounter (Signed)
Patient called, left VM to return the call to the office to speak to a nurse. 

## 2021-04-01 NOTE — Telephone Encounter (Signed)
Lmtcb to see how patient is feeling today and if symptoms are improving, if patient returns call [facility] nurse please triage. KW

## 2021-04-02 NOTE — Telephone Encounter (Signed)
Attempted to reach patient with no response. Okay to close encounter? KW

## 2021-04-04 ENCOUNTER — Telehealth: Payer: Self-pay | Admitting: *Deleted

## 2021-04-04 ENCOUNTER — Telehealth: Payer: Self-pay

## 2021-04-04 NOTE — Telephone Encounter (Signed)
Marlowe Kays advised.   Thanks,   -Mickel Baas

## 2021-04-04 NOTE — Telephone Encounter (Signed)
Copied from [place] [id number]. Topic: Quick Communication - Home Health Verbal Orders >> Apr 04, 2021  8:45 AM Tessa Lerner A wrote: Caller/Agency: Damian Leavell Number: [phone] Requesting OT/PT/Skilled Nursing/Social Work/Speech Therapy: OT Frequency: 1w6  Requesting OT/PT/Skilled Nursing/Social Work/Speech Therapy: Home Health Aide Frequency: 1w6

## 2021-04-04 NOTE — Telephone Encounter (Signed)
OK for verbals 

## 2021-04-04 NOTE — Telephone Encounter (Signed)
° °  Telephone encounter was:  Unsuccessful.  04/04/2021 Name: Christina Rubio MRN: [id number] DOB: 1950-06-01  Unsuccessful outbound call made today to assist with:  Finances   Outreach Attempt:  2nd Attempt  A HIPAA compliant voice message was left requesting a return call.  Instructed patient to call back at   Instructed patient to call back at [phone]  at their earliest convenience. .  [place], Care Management  [phone] [address] , [place] [address] Email : [email or web address]

## 2021-04-05 ENCOUNTER — Telehealth: Payer: Self-pay | Admitting: *Deleted

## 2021-04-05 DIAGNOSIS — I890 Lymphedema, not elsewhere classified: Secondary | ICD-10-CM | POA: Diagnosis not present

## 2021-04-05 DIAGNOSIS — E1151 Type 2 diabetes mellitus with diabetic peripheral angiopathy without gangrene: Secondary | ICD-10-CM | POA: Diagnosis not present

## 2021-04-05 DIAGNOSIS — Z6841 Body Mass Index (BMI) 40.0 and over, adult: Secondary | ICD-10-CM | POA: Diagnosis not present

## 2021-04-05 DIAGNOSIS — E785 Hyperlipidemia, unspecified: Secondary | ICD-10-CM | POA: Diagnosis not present

## 2021-04-05 DIAGNOSIS — I872 Venous insufficiency (chronic) (peripheral): Secondary | ICD-10-CM | POA: Diagnosis not present

## 2021-04-05 DIAGNOSIS — F331 Major depressive disorder, recurrent, moderate: Secondary | ICD-10-CM | POA: Diagnosis not present

## 2021-04-05 DIAGNOSIS — E034 Atrophy of thyroid (acquired): Secondary | ICD-10-CM | POA: Diagnosis not present

## 2021-04-05 DIAGNOSIS — Z7984 Long term (current) use of oral hypoglycemic drugs: Secondary | ICD-10-CM | POA: Diagnosis not present

## 2021-04-05 DIAGNOSIS — K224 Dyskinesia of esophagus: Secondary | ICD-10-CM | POA: Diagnosis not present

## 2021-04-05 DIAGNOSIS — E039 Hypothyroidism, unspecified: Secondary | ICD-10-CM | POA: Diagnosis not present

## 2021-04-05 DIAGNOSIS — E1159 Type 2 diabetes mellitus with other circulatory complications: Secondary | ICD-10-CM | POA: Diagnosis not present

## 2021-04-05 DIAGNOSIS — F411 Generalized anxiety disorder: Secondary | ICD-10-CM | POA: Diagnosis not present

## 2021-04-05 DIAGNOSIS — E1169 Type 2 diabetes mellitus with other specified complication: Secondary | ICD-10-CM | POA: Diagnosis not present

## 2021-04-05 DIAGNOSIS — I152 Hypertension secondary to endocrine disorders: Secondary | ICD-10-CM | POA: Diagnosis not present

## 2021-04-08 NOTE — Telephone Encounter (Signed)
° °  Telephone encounter was:  Successful.  04/08/2021 Name: Aleysha Meckler MRN: [id number] DOB: 03/27/1950  Emmanuela Ghazi is a 71 y.o. year old female who is a primary care patient of Bacigalupo, Dionne Bucy, MD . The community resource team was consulted for assistance with Daughter ill patient struggling with alot of thing , her health , bills and the care of daughter and grandaughter , provided family reources to help with daughter and granddaughter care via email   Care guide performed the following interventions: Patient provided with information about care guide support team and interviewed to confirm resource needs Follow up call placed to community resources to determine status of patients referral.  Follow Up Plan:  No further follow up planned at this time. The patient has been provided with needed resources.  [place], Care Management  [phone] [address] , [place] [address] Email : [email or web address]

## 2021-04-09 ENCOUNTER — Telehealth: Payer: Self-pay

## 2021-04-09 ENCOUNTER — Ambulatory Visit: Payer: Self-pay

## 2021-04-09 DIAGNOSIS — E1169 Type 2 diabetes mellitus with other specified complication: Secondary | ICD-10-CM

## 2021-04-09 NOTE — Chronic Care Management (AMB) (Signed)
Error. Please disregard

## 2021-04-09 NOTE — Telephone Encounter (Signed)
°  Care Management   Follow Up Note   04/09/2021 Name: Christina Rubio MRN: [id number] DOB: 04-11-1950   Primary Care Provider: [name], MD Reason for referral : Chronic Care Management   An unsuccessful telephone outreach was attempted today. The patient was referred to the case management team for assistance with care management and care coordination.    Follow Up Plan:  A HIPAA compliant voice message was left today requesting a return call.   [name] Care Management [facility] [phone]

## 2021-04-12 ENCOUNTER — Other Ambulatory Visit: Payer: Self-pay | Admitting: Nurse Practitioner

## 2021-04-12 DIAGNOSIS — H1032 Unspecified acute conjunctivitis, left eye: Secondary | ICD-10-CM

## 2021-04-19 ENCOUNTER — Telehealth: Payer: Self-pay

## 2021-04-19 NOTE — Telephone Encounter (Signed)
°  Care Management   Follow Up Note   04/19/2021 Name: Christina Rubio MRN: [id number] DOB: 1951/03/04   Primary Care Provider: [name], MD Reason for referral : Chronic Care Management   An unsuccessful telephone outreach was attempted today. The patient was referred to the case management team for assistance with care management and care coordination.    Follow Up Plan:  A HIPAA compliant voice message as left today requesting a return call.  [name] Care Management [facility] [phone]

## 2021-04-23 DIAGNOSIS — F5105 Insomnia due to other mental disorder: Secondary | ICD-10-CM | POA: Diagnosis not present

## 2021-04-23 DIAGNOSIS — F411 Generalized anxiety disorder: Secondary | ICD-10-CM | POA: Diagnosis not present

## 2021-04-23 DIAGNOSIS — F331 Major depressive disorder, recurrent, moderate: Secondary | ICD-10-CM | POA: Diagnosis not present

## 2021-04-23 DIAGNOSIS — F410 Panic disorder [episodic paroxysmal anxiety] without agoraphobia: Secondary | ICD-10-CM | POA: Diagnosis not present

## 2021-04-24 ENCOUNTER — Other Ambulatory Visit: Payer: Self-pay | Admitting: Family Medicine

## 2021-04-24 NOTE — Telephone Encounter (Signed)
Requested Prescriptions  Pending Prescriptions Disp Refills   valsartan-hydrochlorothiazide (DIOVAN-HCT) 160-25 MG tablet [Pharmacy Med Name: VALSARTAN-HCTZ 160-25 MG TAB] 90 tablet 1    Sig: TAKE 1 TABLET BY MOUTH NIGHTLY     Cardiovascular: ARB + Diuretic Combos Failed - 04/24/2021  8:03 AM      Failed - Cr in normal range and within 180 days    Creatinine, Ser  Date Value Ref Range Status  12/04/2020 1.01 (H) 0.57 - 1.00 mg/dL Final         Passed - K in normal range and within 180 days    Potassium  Date Value Ref Range Status  12/04/2020 3.8 3.5 - 5.2 mmol/L Final         Passed - Na in normal range and within 180 days    Sodium  Date Value Ref Range Status  12/04/2020 138 134 - 144 mmol/L Final         Passed - eGFR is 10 or above and within 180 days    GFR calc Af Amer  Date Value Ref Range Status  12/29/2019 75 >59 mL/min/1.73 Final    Comment:    **[facility] currently reports eGFR in compliance with the current**   recommendations of the [employer]. [facility] will   update reporting as new guidelines are published from the NKF-ASN   Task force.    GFR calc non Af Amer  Date Value Ref Range Status  12/29/2019 65 >59 mL/min/1.73 Final   eGFR  Date Value Ref Range Status  12/04/2020 60 >59 mL/min/1.73 Final         Passed - Patient is not pregnant      Passed - Last BP in normal range    BP Readings from Last 1 Encounters:  12/04/20 (!) 131/53         Passed - Valid encounter within last 6 months    Recent Outpatient Visits          2 months ago GAD (generalized anxiety disorder)   [facility], Dionne Bucy, MD   4 months ago [facility], Dionne Bucy, MD   5 months ago Erroneous encounter - disregard   [facility] Jerrol Banana., MD   10 months ago Xerosis of skin   [facility], Dionne Bucy, MD   11 months ago Type 2 diabetes mellitus  with other specified complication, without long-term current use of insulin [facility])   [place], Dionne Bucy, MD      Future Appointments            In 2 months Bacigalupo, Dionne Bucy, MD [facility] [place]

## 2021-05-05 DIAGNOSIS — E039 Hypothyroidism, unspecified: Secondary | ICD-10-CM | POA: Diagnosis not present

## 2021-05-05 DIAGNOSIS — Z7984 Long term (current) use of oral hypoglycemic drugs: Secondary | ICD-10-CM | POA: Diagnosis not present

## 2021-05-05 DIAGNOSIS — I872 Venous insufficiency (chronic) (peripheral): Secondary | ICD-10-CM | POA: Diagnosis not present

## 2021-05-05 DIAGNOSIS — Z6841 Body Mass Index (BMI) 40.0 and over, adult: Secondary | ICD-10-CM | POA: Diagnosis not present

## 2021-05-05 DIAGNOSIS — F331 Major depressive disorder, recurrent, moderate: Secondary | ICD-10-CM | POA: Diagnosis not present

## 2021-05-05 DIAGNOSIS — I152 Hypertension secondary to endocrine disorders: Secondary | ICD-10-CM | POA: Diagnosis not present

## 2021-05-05 DIAGNOSIS — E034 Atrophy of thyroid (acquired): Secondary | ICD-10-CM | POA: Diagnosis not present

## 2021-05-05 DIAGNOSIS — E1151 Type 2 diabetes mellitus with diabetic peripheral angiopathy without gangrene: Secondary | ICD-10-CM | POA: Diagnosis not present

## 2021-05-05 DIAGNOSIS — E1159 Type 2 diabetes mellitus with other circulatory complications: Secondary | ICD-10-CM | POA: Diagnosis not present

## 2021-05-05 DIAGNOSIS — E1169 Type 2 diabetes mellitus with other specified complication: Secondary | ICD-10-CM | POA: Diagnosis not present

## 2021-05-05 DIAGNOSIS — E785 Hyperlipidemia, unspecified: Secondary | ICD-10-CM | POA: Diagnosis not present

## 2021-05-05 DIAGNOSIS — K224 Dyskinesia of esophagus: Secondary | ICD-10-CM | POA: Diagnosis not present

## 2021-05-05 DIAGNOSIS — I890 Lymphedema, not elsewhere classified: Secondary | ICD-10-CM | POA: Diagnosis not present

## 2021-05-05 DIAGNOSIS — F411 Generalized anxiety disorder: Secondary | ICD-10-CM | POA: Diagnosis not present

## 2021-05-10 ENCOUNTER — Telehealth: Payer: Self-pay | Admitting: Family Medicine

## 2021-05-10 NOTE — Telephone Encounter (Signed)
Shawn from [facility] called in states patient is sick today and can't do PT

## 2021-05-10 NOTE — Telephone Encounter (Signed)
Noted  

## 2021-05-29 ENCOUNTER — Telehealth: Payer: Self-pay | Admitting: Family Medicine

## 2021-05-30 NOTE — Telephone Encounter (Signed)
Requested medication (s) are due for refill today: yes ? ?Requested medication (s) are on the active medication list: yes ? ?Last refill:  02/18/21 #30/1 ? ?Future visit scheduled: yes ? ?Notes to clinic:  Unable to refill per protocol due to failed labs, no updated results. ? ? ? ?  ?Requested Prescriptions  ?Pending Prescriptions Disp Refills  ? metFORMIN (GLUCOPHAGE-XR) 500 MG 24 hr tablet [Pharmacy Med Name: METFORMIN HCL ER 500 MG TAB] 30 tablet 1  ?  Sig: TAKE ONE TABLET EVERY DAY WITH BREAKFAST  ?  ? Endocrinology:  Diabetes - Biguanides Failed - 05/29/2021  1:04 PM  ?  ?  Failed - Cr in normal range and within 360 days  ?  Creatinine, Ser  ?Date Value Ref Range Status  ?12/04/2020 1.01 (H) 0.57 - 1.00 mg/dL Final  ?  ?  ?  ?  Failed - B12 Level in normal range and within 720 days  ?  No results found for: VITAMINB12  ?  ?  ?  Failed - CBC within normal limits and completed in the last 12 months  ?  WBC  ?Date Value Ref Range Status  ?09/11/2018 9.8 4.0 - 10.5 K/uL Final  ? ?RBC  ?Date Value Ref Range Status  ?09/11/2018 4.74 3.87 - 5.11 MIL/uL Final  ? ?Hemoglobin  ?Date Value Ref Range Status  ?09/11/2018 13.3 12.0 - 15.0 g/dL Final  ?05/28/2017 14.0 11.1 - 15.9 g/dL Final  ? ?HCT  ?Date Value Ref Range Status  ?09/11/2018 41.1 36.0 - 46.0 % Final  ? ?Hematocrit  ?Date Value Ref Range Status  ?05/28/2017 41.5 34.0 - 46.6 % Final  ? ?MCHC  ?Date Value Ref Range Status  ?09/11/2018 32.4 30.0 - 36.0 g/dL Final  ? ?MCH  ?Date Value Ref Range Status  ?09/11/2018 28.1 26.0 - 34.0 pg Final  ? ?MCV  ?Date Value Ref Range Status  ?09/11/2018 86.7 80.0 - 100.0 fL Final  ?05/28/2017 88 79 - 97 fL Final  ? ?No results found for: PLTCOUNTKUC, LABPLAT, [place] ?RDW  ?Date Value Ref Range Status  ?09/11/2018 13.2 11.5 - 15.5 % Final  ?05/28/2017 14.0 12.3 - 15.4 % Final  ? ?  ?  ?  Passed - HBA1C is between 0 and 7.9 and within 180 days  ?  Hgb A1c MFr Bld  ?Date Value Ref Range Status  ?12/04/2020 6.2 (H) 4.8 - 5.6 % Final   ?  Comment:  ?           Prediabetes: 5.7 - 6.4 ?         Diabetes: >6.4 ?         Glycemic control for adults with diabetes: <7.0 ?  ?  ?  ?  ?  Passed - eGFR in normal range and within 360 days  ?  GFR calc Af Amer  ?Date Value Ref Range Status  ?12/29/2019 75 >59 mL/min/1.73 Final  ?  Comment:  ?  **[facility] currently reports eGFR in compliance with the current** ?  recommendations of the [employer]. [facility] will ?  update reporting as new guidelines are published from the NKF-ASN ?  Task force. ?  ? ?GFR calc non Af Amer  ?Date Value Ref Range Status  ?12/29/2019 65 >59 mL/min/1.73 Final  ? ?eGFR  ?Date Value Ref Range Status  ?12/04/2020 60 >59 mL/min/1.73 Final  ?  ?  ?  ?  Passed - Valid encounter within last 6 months  ?  Recent Outpatient Visits   ? ?      ? 3 months ago GAD (generalized anxiety disorder)  ? [facility] [place], Dionne Bucy, MD  ? 5 months ago Lymphedema  ? [facility] Bacigalupo, Dionne Bucy, MD  ? 6 months ago Erroneous encounter - disregard  ? [facility] Jerrol Banana., MD  ? 11 months ago Xerosis of skin  ? [facility] [place], Dionne Bucy, MD  ? 1 year ago Type 2 diabetes mellitus with other specified complication, without long-term current use of insulin ([place])  ? [facility] Bacigalupo, Dionne Bucy, MD  ? ?  ?  ?Future Appointments   ? ?        ? In 1 month Bacigalupo, Dionne Bucy, MD [facility]  ? ?  ? ?  ?  ?  ? ?

## 2021-05-31 MED ORDER — METFORMIN HCL ER 500 MG PO TB24: ORAL_TABLET | ORAL | 1 refills | Status: DC

## 2021-05-31 NOTE — Addendum Note (Signed)
Addended by: Shawna Orleans on: 05/31/2021 01:54 PM ? ? Modules accepted: Orders ? ?

## 2021-05-31 NOTE — Telephone Encounter (Signed)
Prescription updated.

## 2021-05-31 NOTE — Telephone Encounter (Signed)
Pt wanted to know why a 90 day supply was not sent in, please advise.  ?

## 2021-07-05 ENCOUNTER — Encounter: Payer: Medicare Other | Admitting: Family Medicine

## 2021-07-11 DIAGNOSIS — F5105 Insomnia due to other mental disorder: Secondary | ICD-10-CM | POA: Diagnosis not present

## 2021-07-11 DIAGNOSIS — F331 Major depressive disorder, recurrent, moderate: Secondary | ICD-10-CM | POA: Diagnosis not present

## 2021-07-11 DIAGNOSIS — F410 Panic disorder [episodic paroxysmal anxiety] without agoraphobia: Secondary | ICD-10-CM | POA: Diagnosis not present

## 2021-07-11 DIAGNOSIS — F411 Generalized anxiety disorder: Secondary | ICD-10-CM | POA: Diagnosis not present

## 2021-07-16 ENCOUNTER — Telehealth: Payer: Self-pay

## 2021-07-16 NOTE — Telephone Encounter (Signed)
Please advise 

## 2021-07-16 NOTE — Telephone Encounter (Signed)
Copied from [place] [id number]. Topic: Appointment Scheduling - Scheduling Inquiry for Clinic ?>> Jul 16, 2021 11:31 AM Erick Blinks wrote: ?Reason for CRM: Pt called and is requesting to have lab work prior to her upcoming CPE, she wants to have a full thyroid panel for labs. Please advise  ?Best contact: [phone] ?

## 2021-07-17 NOTE — Telephone Encounter (Signed)
Patient advised that Dr. Jacinto Reap is not in the office this week. However I did see that Ria Comment ordered labs for this patient in 11/22. I advised patient what lindsay ordered and she reports that she will try to come in before her appt on 07/30/21. If not will get labs done same day.  ?

## 2021-07-29 NOTE — Progress Notes (Deleted)
Complete physical exam   Patient: Christina Rubio   DOB: 1951/03/15   71 y.o. Female  MRN: [id number] Visit Date: 07/30/2021  Today's healthcare provider: Lavon Paganini, MD   No chief complaint on file.  Subjective    Christina Rubio is a 71 y.o. female who presents today for a complete physical exam.  She reports consuming a {diet types:17450} diet. {Exercise:19826} She generally feels {well/fairly well/poorly:18703}. She reports sleeping {well/fairly well/poorly:18703}. She {does/does not:200015} have additional problems to discuss today.  HPI  AWV 03/04/2021 with [facility]  Past Medical History:  Diagnosis Date   Anxiety    Arthritis    Depression    Hyperlipidemia    Hypertension    Neuromuscular disorder ([place])    Osteoporosis    PONV (postoperative nausea and vomiting)    Skin cancer    non melanoma   Spinal stenosis    Thyroid disease    hyperthyroid s/p partial thyroidectomy   Past Surgical History:  Procedure Laterality Date   CESAREAN SECTION     CHOLECYSTECTOMY     COLON SURGERY     FEMUR IM NAIL Right 01/09/2016   Procedure: INTRAMEDULLARY (IM) RETROGRADE FEMORAL NAILING RIGHT DISTAL FEMUR;  Surgeon: Dorna Leitz, MD;  Location: [facility];  Service: Orthopedics;  Laterality: Right;   LAPAROSCOPIC GASTRIC SLEEVE RESECTION     Right thyroidectomy     TONSILLECTOMY     Social History   Socioeconomic History   Marital status: Widowed    Spouse name: Not on file   Number of children: 1   Years of education: 2 master's degrees   Highest education level: Master's degree (e.g., MA, MS, MEng, MEd, MSW, MBA)  Occupational History   Occupation: retired    Comment: Proofreader  Tobacco Use   Smoking status: Never   Smokeless tobacco: Never  Vaping Use   Vaping Use: Never used  Substance and Sexual Activity   Alcohol use: Yes    Comment: 1-2 drinks per year   Drug use: No   Sexual activity: Never  Other Topics Concern   Not on file  Social History  Narrative   Not on file   Social Determinants of Health   Financial Resource Strain: High Risk   Difficulty of Paying Living Expenses: Hard  Food Insecurity: No Food Insecurity   Worried About Charity fundraiser in the Last Year: Never true   Ran Out of Food in the Last Year: Never true  Transportation Needs: No Transportation Needs   Lack of Transportation (Medical): No   Lack of Transportation (Non-Medical): No  Physical Activity: Insufficiently Active   Days of Exercise per Week: 3 days   Minutes of Exercise per Session: 10 min  Stress: No Stress Concern Present   Feeling of Stress : Only a little  Social Connections: Moderately Integrated   Frequency of Communication with Friends and Family: Three times a week   Frequency of Social Gatherings with Friends and Family: Three times a week   Attends Religious Services: 1 to 4 times per year   Active Member of Clubs or Organizations: No   Attends Music therapist: More than 4 times per year   Marital Status: Widowed  Human resources officer Violence: Not At Risk   Fear of Current or Ex-Partner: No   Emotionally Abused: No   Physically Abused: No   Sexually Abused: No   Family Status  Relation Name Status   Mother  Deceased  Father  Deceased   Daughter  Alive        poroxysmal nocturnal hemoglobinuria   Mat Uncle  (Not Specified)   Psychiatrist  (Not Specified)   MGM  Deceased   MGF  Deceased   PGM  Deceased   Sister  Alive   Brother  Alive   Sister  Alive   PGF  Deceased   Neg Hx  (Not Specified)   Family History  Problem Relation Age of Onset   Lung cancer Mother    Prostate cancer Father    Depression Daughter    Anemia Daughter        [place]   Thyroid cancer Maternal Uncle    Lung cancer Paternal Uncle    Stroke Maternal Grandmother    Stroke Maternal Grandfather    Stroke Paternal Grandmother    Healthy Sister    Healthy Brother    Healthy Sister    Emphysema Paternal Grandfather    Colon cancer  Neg Hx    Breast cancer Neg Hx    Ovarian cancer Neg Hx    Cervical cancer Neg Hx    Allergies  Allergen Reactions   Shellfish Allergy     Patient reported.   Epinephrine Palpitations    faint   Penicillins Rash    Has patient had a PCN reaction causing immediate rash, facial/tongue/throat swelling, SOB or lightheadedness with hypotension:  Yes Has patient had a PCN reaction causing severe rash involving mucus membranes or skin necrosis: No Has patient had a PCN reaction that required hospitalization: No Has patient had a PCN reaction occurring within the last 10 years: No If all of the above answers are "NO", then may proceed with Cephalosporin use.;   Strawberry Extract Rash    Patient Care Team: [name], MD as PCP - General (Family Medicine) Dorna Leitz, MD as Consulting Physician (Orthopedic Surgery) End, Harrell Gave, MD as Consulting Physician (Cardiology) Neldon Labella, RN as Case Manager [place], [place], LCSW as Social Worker   Medications: Outpatient Medications Prior to Visit  Medication Sig   ALPRAZolam (XANAX) 0.5 MG tablet TAKE 1 TABLET BY MOUTH EVERY DAY AS NEEDED FOR ANXIETY.   atorvastatin (LIPITOR) 20 MG tablet TAKE 1 TABLET BY MOUTH DAILY   blood glucose meter kit and supplies Dispense based on patient and insurance preference. Use once daily to check for fasting blood sugars. (FOR ICD-10 E10.9, E11.9).   famotidine (PEPCID) 20 MG tablet TAKE ONE TABLET BY MOUTH TWICE A DAY   gentamicin (GARAMYCIN) 0.3 % ophthalmic solution Place 1 drop into both eyes every 4 (four) hours while awake. (Patient not taking: Reported on 03/04/2021)   levothyroxine (SYNTHROID) 100 MCG tablet TAKE ONE TABLET EVERY DAY   loperamide (IMODIUM A-D) 2 MG tablet Take 1 tablet (2 mg total) by mouth 4 (four) times daily as needed for diarrhea or loose stools.   metFORMIN (GLUCOPHAGE-XR) 500 MG 24 hr tablet TAKE ONE TABLET EVERY DAY WITH BREAKFAST   metoprolol succinate  (TOPROL-XL) 50 MG 24 hr tablet TAKE 1 TABLET BY MOUTH DAILY WITH OR IMMEDIATELY FOLLOWING A MEAL   Multiple Vitamins-Minerals (PRESERVISION AREDS 2) CAPS Take 1 tablet by mouth 2 (two) times daily.   ondansetron (ZOFRAN ODT) 4 MG disintegrating tablet Take 1 tablet (4 mg total) by mouth every 8 (eight) hours as needed.   Probiotic Product (PROBIOTIC PO) Take by mouth daily.    sertraline (ZOLOFT) 100 MG tablet Take 2 tablets (200 mg total) by  mouth daily.   triamcinolone ointment (KENALOG) 0.5 % APPLY TO AFFECTED AREAS TOPICALLY TWICE DAILY   valsartan-hydrochlorothiazide (DIOVAN-HCT) 160-25 MG tablet TAKE 1 TABLET BY MOUTH NIGHTLY   No facility-administered medications prior to visit.    Review of Systems  {Labs  Heme  Chem  Endocrine  Serology  Results Review (optional):23779}  Objective    There were no vitals taken for this visit. {Show previous vital signs (optional):23777}   Physical Exam  ***  Last depression screening scores    03/04/2021   11:05 AM 12/04/2020    9:18 AM 12/04/2020    9:13 AM  PHQ 2/9 Scores  PHQ - 2 Score $Remov'1 4 2  'WctFjg$ PHQ- 9 Score 1 16    Last fall risk screening    03/04/2021   11:15 AM  [place] in the past year? 0  Number falls in past yr: 0  Injury with Fall? 0  Risk for fall due to : History of fall(s)  Follow up Falls prevention discussed   Last Audit-C alcohol use screening    03/04/2021   11:04 AM  Alcohol Use Disorder Test (AUDIT)  1. How often do you have a drink containing alcohol? 0  2. How many drinks containing alcohol do you have on a typical day when you are drinking? 0  3. How often do you have six or more drinks on one occasion? 0  AUDIT-C Score 0   A score of 3 or more in women, and 4 or more in men indicates increased risk for alcohol abuse, EXCEPT if all of the points are from question 1   No results found for any visits on 07/30/21.  Assessment & Plan    Routine Health Maintenance and Physical  Exam  Exercise Activities and Dietary recommendations  Goals      DIET - EAT MORE FRUITS AND VEGETABLES     DIET - INCREASE WATER INTAKE     Recommend to drink at least 6-8 8oz glasses of water per day.      Find Help in My Community     Timeframe:  Long-Range Goal Priority:  High Start Date:  04/19/20                           Expected End Date:    09/17/20                   Follow Up Date 08/14/20   - begin a notebook of services in my neighborhood or community - continue to follow-up on any referrals for help I am given ([facility], [place]) - think ahead to make sure my need does not become an emergency - have a back-up plan to meet transportation needs   Why is this important?   Knowing how and where to find help for yourself or family in your neighborhood and community is an important skill.  You will want to take some steps to learn how.    Notes:         Immunization History  Administered Date(s) Administered   Fluad Quad(high Dose 65+) 01/06/2019, 12/04/2020   Influenza, High Dose Seasonal PF 01/29/2017, 04/20/2018   Influenza,inj,Quad PF,6+ Mos 01/26/2020   PFIZER(Purple Top)SARS-COV-2 Vaccination 05/20/2019, 06/14/2019   Pneumococcal Conjugate-13 05/28/2017   Pneumococcal Polysaccharide-23 07/15/2019   Td 05/24/2018   Zoster Recombinat (Shingrix) 11/17/2017    Health Maintenance  Topic Date Due   FOOT  EXAM  Never done   OPHTHALMOLOGY EXAM  Never done   COLONOSCOPY (Pts 45-32yrs Insurance coverage will need to be confirmed)  Never done   MAMMOGRAM  Never done   DEXA SCAN  Never done   Zoster Vaccines- Shingrix (2 of 2) 01/12/2018   COVID-19 Vaccine (3 - Booster for Pfizer series) 08/09/2019   HEMOGLOBIN A1C  06/03/2021   INFLUENZA VACCINE  10/22/2021   TETANUS/TDAP  05/23/2028   Pneumonia Vaccine 6+ Years old  Completed   Hepatitis C Screening  Completed   HPV VACCINES  Aged Out    Discussed health benefits of physical activity,  and encouraged her to engage in regular exercise appropriate for her age and condition.  ***  No follow-ups on file.     {provider attestation***:1}   Lavon Paganini, MD  [facility] [phone] (phone) [phone] (fax)  [place]

## 2021-07-30 ENCOUNTER — Encounter: Payer: Medicare Other | Admitting: Family Medicine

## 2021-08-06 NOTE — Progress Notes (Signed)
I,Sulibeya S Dimas,acting as a Neurosurgeon for Shirlee Latch, MD.,have documented all relevant documentation on the behalf of Shirlee Latch, MD,as directed by  Shirlee Latch, MD while in the presence of Shirlee Latch, MD.   Complete physical exam   Patient: Christina Rubio   DOB: 30-Jul-1950   71 y.o. Female  MRN: [id number] Visit Date: 08/08/2021  Today's healthcare provider: Shirlee Latch, MD   Chief Complaint  Patient presents with   Annual Exam   Subjective    Christina Rubio is a 71 y.o. female who presents today for a complete physical exam.  She reports consuming a general diet. The patient does not participate in regular exercise at present. She generally feels fairly well. She reports sleeping poorly. She does not have additional problems to discuss today.  HPI  03/04/21 AWV  Past Medical History:  Diagnosis Date   Anxiety    Arthritis    Depression    Hyperlipidemia    Hypertension    Neuromuscular disorder (HCC)    Osteoporosis    PONV (postoperative nausea and vomiting)    Skin cancer    non melanoma   Spinal stenosis    Thyroid disease    hyperthyroid s/p partial thyroidectomy   Past Surgical History:  Procedure Laterality Date   CESAREAN SECTION     CHOLECYSTECTOMY     COLON SURGERY     FEMUR IM NAIL Right 01/09/2016   Procedure: INTRAMEDULLARY (IM) RETROGRADE FEMORAL NAILING RIGHT DISTAL FEMUR;  Surgeon: Jodi Geralds, MD;  Location: [facility] OR;  Service: Orthopedics;  Laterality: Right;   LAPAROSCOPIC GASTRIC SLEEVE RESECTION     Right thyroidectomy     TONSILLECTOMY     Social History   Socioeconomic History   Marital status: Widowed    Spouse name: Not on file   Number of children: 1   Years of education: 2 master's degrees   Highest education level: Master's degree (e.g., MA, MS, MEng, MEd, MSW, MBA)  Occupational History   Occupation: retired    Comment: Advice worker  Tobacco Use   Smoking status: Never   Smokeless  tobacco: Never  Vaping Use   Vaping Use: Never used  Substance and Sexual Activity   Alcohol use: Yes    Comment: 1-2 drinks per year   Drug use: No   Sexual activity: Never  Other Topics Concern   Not on file  Social History Narrative   Not on file   Social Determinants of Health   Financial Resource Strain: High Risk   Difficulty of Paying Living Expenses: Hard  Food Insecurity: No Food Insecurity   Worried About Programme researcher, broadcasting/film/video in the Last Year: Never true   Ran Out of Food in the Last Year: Never true  Transportation Needs: No Transportation Needs   Lack of Transportation (Medical): No   Lack of Transportation (Non-Medical): No  Physical Activity: Insufficiently Active   Days of Exercise per Week: 3 days   Minutes of Exercise per Session: 10 min  Stress: No Stress Concern Present   Feeling of Stress : Only a little  Social Connections: Moderately Integrated   Frequency of Communication with Friends and Family: Three times a week   Frequency of Social Gatherings with Friends and Family: Three times a week   Attends Religious Services: 1 to 4 times per year   Active Member of Clubs or Organizations: No   Attends Engineer, structural: More than 4 times per year   Marital  Status: Widowed  Human resources officer Violence: Not At Risk   Fear of Current or Ex-Partner: No   Emotionally Abused: No   Physically Abused: No   Sexually Abused: No   Family Status  Relation Name Status   Mother  Deceased   Father  Deceased   Daughter  Alive        poroxysmal nocturnal hemoglobinuria   Mat Uncle  (Not Specified)   Psychiatrist  (Not Specified)   MGM  Deceased   MGF  Deceased   PGM  Deceased   Sister  Alive   Brother  Alive   Sister  Alive   PGF  Deceased   Neg Hx  (Not Specified)   Family History  Problem Relation Age of Onset   Lung cancer Mother    Prostate cancer Father    Depression Daughter    Anemia Daughter        [place]   Thyroid cancer Maternal Uncle     Lung cancer Paternal Uncle    Stroke Maternal Grandmother    Stroke Maternal Grandfather    Stroke Paternal Grandmother    Healthy Sister    Healthy Brother    Healthy Sister    Emphysema Paternal Grandfather    Colon cancer Neg Hx    Breast cancer Neg Hx    Ovarian cancer Neg Hx    Cervical cancer Neg Hx    Allergies  Allergen Reactions   Shellfish Allergy     Patient reported.   Epinephrine Palpitations    faint   Penicillins Rash    Has patient had a PCN reaction causing immediate rash, facial/tongue/throat swelling, SOB or lightheadedness with hypotension:  Yes Has patient had a PCN reaction causing severe rash involving mucus membranes or skin necrosis: No Has patient had a PCN reaction that required hospitalization: No Has patient had a PCN reaction occurring within the last 10 years: No If all of the above answers are "NO", then may proceed with Cephalosporin use.;   Strawberry Extract Rash    Patient Care Team: [name], MD as PCP - General (Family Medicine) Dorna Leitz, MD as Consulting Physician (Orthopedic Surgery) End, Harrell Gave, MD as Consulting Physician (Cardiology) Neldon Labella, RN as Case Manager [place], [place], LCSW as Social Worker   Medications: Outpatient Medications Prior to Visit  Medication Sig   ALPRAZolam (XANAX) 0.5 MG tablet TAKE 1 TABLET BY MOUTH EVERY DAY AS NEEDED FOR ANXIETY.   atorvastatin (LIPITOR) 20 MG tablet TAKE 1 TABLET BY MOUTH DAILY   blood glucose meter kit and supplies Dispense based on patient and insurance preference. Use once daily to check for fasting blood sugars. (FOR ICD-10 E10.9, E11.9).   gentamicin (GARAMYCIN) 0.3 % ophthalmic solution Place 1 drop into both eyes every 4 (four) hours while awake.   levothyroxine (SYNTHROID) 100 MCG tablet TAKE ONE TABLET EVERY DAY   loperamide (IMODIUM A-D) 2 MG tablet Take 1 tablet (2 mg total) by mouth 4 (four) times daily as needed for diarrhea or loose stools.    metFORMIN (GLUCOPHAGE-XR) 500 MG 24 hr tablet TAKE ONE TABLET EVERY DAY WITH BREAKFAST   metoprolol succinate (TOPROL-XL) 50 MG 24 hr tablet TAKE 1 TABLET BY MOUTH DAILY WITH OR IMMEDIATELY FOLLOWING A MEAL   Multiple Vitamins-Minerals (PRESERVISION AREDS 2) CAPS Take 1 tablet by mouth 2 (two) times daily.   ondansetron (ZOFRAN ODT) 4 MG disintegrating tablet Take 1 tablet (4 mg total) by mouth every 8 (eight) hours as  needed.   Probiotic Product (PROBIOTIC PO) Take by mouth daily.    sertraline (ZOLOFT) 100 MG tablet Take 2 tablets (200 mg total) by mouth daily.   triamcinolone ointment (KENALOG) 0.5 % APPLY TO AFFECTED AREAS TOPICALLY TWICE DAILY   valsartan-hydrochlorothiazide (DIOVAN-HCT) 160-25 MG tablet TAKE 1 TABLET BY MOUTH NIGHTLY   [DISCONTINUED] famotidine (PEPCID) 20 MG tablet TAKE ONE TABLET BY MOUTH TWICE A DAY (Patient not taking: Reported on 08/08/2021)   No facility-administered medications prior to visit.    Review of Systems  Constitutional: Negative.   HENT:  Positive for dental problem.   Eyes: Negative.   Respiratory:  Positive for shortness of breath.   Cardiovascular:  Positive for leg swelling.  Gastrointestinal:  Positive for diarrhea.  Endocrine: Positive for cold intolerance and heat intolerance.  Genitourinary:  Positive for urgency.  Musculoskeletal:  Positive for arthralgias, gait problem and myalgias.  Skin:  Positive for wound.  Allergic/Immunologic: Negative.   Neurological:  Positive for light-headedness.  Hematological: Negative.   Psychiatric/Behavioral:  Positive for decreased concentration and sleep disturbance. The patient is nervous/anxious.    Last CBC Lab Results  Component Value Date   WBC 9.8 09/11/2018   HGB 13.3 09/11/2018   HCT 41.1 09/11/2018   MCV 86.7 09/11/2018   MCH 28.1 09/11/2018   RDW 13.2 09/11/2018   PLT 263 55/20/8022   Last metabolic panel Lab Results  Component Value Date   GLUCOSE 130 (H) 12/04/2020   NA 138  12/04/2020   K 3.8 12/04/2020   CL 100 12/04/2020   CO2 23 12/04/2020   BUN 16 12/04/2020   CREATININE 1.01 (H) 12/04/2020   EGFR 60 12/04/2020   CALCIUM 9.3 12/04/2020   PROT 7.1 12/04/2020   ALBUMIN 4.2 12/04/2020   LABGLOB 2.9 12/04/2020   AGRATIO 1.4 12/04/2020   BILITOT 0.6 12/04/2020   ALKPHOS 39 (L) 12/04/2020   AST 18 12/04/2020   ALT 12 12/04/2020   ANIONGAP 12 09/11/2018   Last lipids Lab Results  Component Value Date   CHOL 138 12/04/2020   HDL 43 12/04/2020   LDLCALC 67 12/04/2020   TRIG 166 (H) 12/04/2020   CHOLHDL 3.2 12/04/2020   Last hemoglobin A1c Lab Results  Component Value Date   HGBA1C 6.2 (H) 12/04/2020   Last thyroid functions Lab Results  Component Value Date   TSH 5.700 (H) 12/04/2020      Objective     BP 117/72 (BP Location: Right Arm, Patient Position: Sitting, Cuff Size: Large)   Pulse 76   Temp 98.5 F (36.9 C) (Oral)   Resp 16   Wt (!) 371 lb 8 oz (168.5 kg)   SpO2 98%   BMI 63.77 kg/m  BP Readings from Last 3 Encounters:  08/08/21 117/72  12/04/20 (!) 131/53  08/09/20 129/62   Wt Readings from Last 3 Encounters:  08/08/21 (!) 371 lb 8 oz (168.5 kg)  08/09/20 (!) 400 lb (181.4 kg)  06/12/20 (!) 400 lb (181.4 kg)       Physical Exam Vitals reviewed.  Constitutional:      General: She is not in acute distress.    Appearance: Normal appearance. She is well-developed. She is not diaphoretic.  HENT:     Head: Normocephalic and atraumatic.  Eyes:     General: No scleral icterus.    Conjunctiva/sclera: Conjunctivae normal.  Neck:     Thyroid: No thyromegaly.  Cardiovascular:     Rate and Rhythm: Normal rate and regular rhythm.  Heart sounds: Normal heart sounds. No murmur heard. Pulmonary:     Effort: Pulmonary effort is normal. No respiratory distress.     Breath sounds: Normal breath sounds. No wheezing, rhonchi or rales.  Musculoskeletal:     Cervical back: Neck supple.     Right lower leg: No edema.      Left lower leg: No edema.  Lymphadenopathy:     Cervical: No cervical adenopathy.  Skin:    General: Skin is warm and dry.     Comments: significant scaling changed of lower extremities,  No ulceration  Wound on back of R thigh that is red and scaling with no signs of infection  Neurological:     Mental Status: She is alert and oriented to person, place, and time. Mental status is at baseline.  Psychiatric:        Mood and Affect: Mood normal.        Behavior: Behavior normal.     Last depression screening scores    08/08/2021    9:46 AM 03/04/2021   11:05 AM 12/04/2020    9:18 AM  PHQ 2/9 Scores  PHQ - 2 Score $Remov'2 1 4  'AicctK$ PHQ- 9 Score $Remov'9 1 16   'yUAOjD$ Last fall risk screening    08/08/2021    9:46 AM  G[place] in the past year? 0  Number falls in past yr: 0  Injury with Fall? 0  Risk for fall due to : No Fall Risks  Follow up Falls evaluation completed   Last Audit-C alcohol use screening    08/08/2021    9:48 AM  Alcohol Use Disorder Test (AUDIT)  1. How often do you have a drink containing alcohol? 0  2. How many drinks containing alcohol do you have on a typical day when you are drinking? 0  3. How often do you have six or more drinks on one occasion? 0  AUDIT-C Score 0   A score of 3 or more in women, and 4 or more in men indicates increased risk for alcohol abuse, EXCEPT if all of the points are from question 1   No results found for any visits on 08/08/21.  Assessment & Plan    Routine Health Maintenance and Physical Exam  Exercise Activities and Dietary recommendations  Goals      DIET - EAT MORE FRUITS AND VEGETABLES     DIET - INCREASE WATER INTAKE     Recommend to drink at least 6-8 8oz glasses of water per day.      Find Help in My Community     Timeframe:  Long-Range Goal Priority:  High Start Date:  04/19/20                           Expected End Date:    09/17/20                   Follow Up Date 08/14/20   - begin a notebook of services in my  neighborhood or community - continue to follow-up on any referrals for help I am given ([facility], [place]) - think ahead to make sure my need does not become an emergency - have a back-up plan to meet transportation needs   Why is this important?   Knowing how and where to find help for yourself or family in your neighborhood and community is an important skill.  You will want  to take some steps to learn how.    Notes:         Immunization History  Administered Date(s) Administered   Fluad Quad(high Dose 65+) 01/06/2019, 12/04/2020   Influenza, High Dose Seasonal PF 01/29/2017, 04/20/2018   Influenza,inj,Quad PF,6+ Mos 01/26/2020   PFIZER(Purple Top)SARS-COV-2 Vaccination 05/20/2019, 06/14/2019   Pneumococcal Conjugate-13 05/28/2017   Pneumococcal Polysaccharide-23 07/15/2019   Td 05/24/2018   Zoster Recombinat (Shingrix) 11/17/2017    Health Maintenance  Topic Date Due   OPHTHALMOLOGY EXAM  Never done   COLONOSCOPY (Pts 45-61yrs Insurance coverage will need to be confirmed)  Never done   MAMMOGRAM  Never done   DEXA SCAN  Never done   Zoster Vaccines- Shingrix (2 of 2) 01/12/2018   COVID-19 Vaccine (3 - Booster for Pfizer series) 08/09/2019   HEMOGLOBIN A1C  06/03/2021   INFLUENZA VACCINE  10/22/2021   FOOT EXAM  08/09/2022   TETANUS/TDAP  05/23/2028   Pneumonia Vaccine 47+ Years old  Completed   Hepatitis C Screening  Completed   HPV VACCINES  Aged Out    Discussed health benefits of physical activity, and encouraged her to engage in regular exercise appropriate for her age and condition.  Problem List Items Addressed This Visit       Cardiovascular and Mediastinum   Hypertension associated with diabetes ([place])    Well controlled Continue current medications Recheck metabolic panel F/u in 6 months       Relevant Orders   Comprehensive metabolic panel     Endocrine   Hyperlipidemia associated with type 2 diabetes mellitus ([place])     Previously well controlled Continue statin Repeat FLP and CMP Goal LDL < 70      Relevant Orders   Lipid Panel With LDL/HDL Ratio   Hypothyroidism due to acquired atrophy of thyroid    Previously with slightly elevated TSH Continue Synthroid at current dose  Recheck TSH and adjust Synthroid as indicated        Relevant Orders   TSH   T2DM (type 2 diabetes mellitus) ([place])    Well controlled previously Recheck W7P Assoc with/complicated by HTN, HLD Continue current medications UTD on vaccines, eye exam (going to schedule appt), foot exam (completed today) Uacr today On Statin Discussed diet and exercise F/u in 6 months       Relevant Orders   Hemoglobin A1c   Microalbumin / creatinine urine ratio     Other   Morbid obesity ([place]) (Chronic)    Congratulated on weiht loss Discussed importance of healthy weight management Discussed diet and exercise       MDD (major depressive disorder)    Chronic and stable F/b psych No med changes today Looking into therapy       Venous stasis    Longstanding problem, also with lymphedema No current ulcerations, but significant skin breakdown Will get [facility] RN back out to help in the home Unable to apply compression stocking, but compression dressings may be helpful       Relevant Orders   Ambulatory referral to [place]   Bilateral lower extremity edema   Relevant Orders   Ambulatory referral to [place]   Lymphedema    See plan for venous stasis above       Relevant Orders   Ambulatory referral to [place]   BMI 60.0-69.9, adult ([place])   Non-healing wound of lower extremity    New wound of posterior R thigh Could be related to venous stasis,  but worried about possible SCC Will refer to Derm for further eval Skin managmenet with [facility] RN I nthe meantime       Relevant Orders   Ambulatory referral to Dermatology   Ambulatory referral to Uncertain   Other Visit Diagnoses     Encounter for annual health  examination    -  Primary   Relevant Orders   Comprehensive metabolic panel   Lipid Panel With LDL/HDL Ratio   TSH   Hemoglobin A1c   Microalbumin / creatinine urine ratio   Colon cancer screening       Relevant Orders   Cologuard   Postmenopausal       Relevant Orders   DG Bone Density   Encounter for screening mammogram for malignant neoplasm of breast       Relevant Orders   MM 3D SCREEN BREAST BILATERAL        Return in about 6 months (around 02/08/2022) for chronic disease f/u.     I, Lavon Paganini, MD, have reviewed all documentation for this visit. The documentation on 08/08/21 for the exam, diagnosis, procedures, and orders are all accurate and complete.   Genecis Veley, Dionne Bucy, MD, MPH [facility]

## 2021-08-08 ENCOUNTER — Ambulatory Visit (INDEPENDENT_AMBULATORY_CARE_PROVIDER_SITE_OTHER): Payer: Medicare Other | Admitting: Family Medicine

## 2021-08-08 ENCOUNTER — Encounter: Payer: Self-pay | Admitting: Family Medicine

## 2021-08-08 VITALS — BP 117/72 | HR 76 | Temp 98.5°F | Resp 16 | Wt 371.5 lb

## 2021-08-08 DIAGNOSIS — I890 Lymphedema, not elsewhere classified: Secondary | ICD-10-CM

## 2021-08-08 DIAGNOSIS — I878 Other specified disorders of veins: Secondary | ICD-10-CM

## 2021-08-08 DIAGNOSIS — Z0000 Encounter for general adult medical examination without abnormal findings: Secondary | ICD-10-CM

## 2021-08-08 DIAGNOSIS — Z780 Asymptomatic menopausal state: Secondary | ICD-10-CM

## 2021-08-08 DIAGNOSIS — I152 Hypertension secondary to endocrine disorders: Secondary | ICD-10-CM | POA: Diagnosis not present

## 2021-08-08 DIAGNOSIS — Z1211 Encounter for screening for malignant neoplasm of colon: Secondary | ICD-10-CM | POA: Diagnosis not present

## 2021-08-08 DIAGNOSIS — E1169 Type 2 diabetes mellitus with other specified complication: Secondary | ICD-10-CM | POA: Diagnosis not present

## 2021-08-08 DIAGNOSIS — Z1231 Encounter for screening mammogram for malignant neoplasm of breast: Secondary | ICD-10-CM

## 2021-08-08 DIAGNOSIS — Z6841 Body Mass Index (BMI) 40.0 and over, adult: Secondary | ICD-10-CM | POA: Diagnosis not present

## 2021-08-08 DIAGNOSIS — S81809A Unspecified open wound, unspecified lower leg, initial encounter: Secondary | ICD-10-CM | POA: Insufficient documentation

## 2021-08-08 DIAGNOSIS — E034 Atrophy of thyroid (acquired): Secondary | ICD-10-CM

## 2021-08-08 DIAGNOSIS — E1159 Type 2 diabetes mellitus with other circulatory complications: Secondary | ICD-10-CM

## 2021-08-08 DIAGNOSIS — R600 Localized edema: Secondary | ICD-10-CM

## 2021-08-08 DIAGNOSIS — F331 Major depressive disorder, recurrent, moderate: Secondary | ICD-10-CM

## 2021-08-08 DIAGNOSIS — E785 Hyperlipidemia, unspecified: Secondary | ICD-10-CM

## 2021-08-08 DIAGNOSIS — S81801A Unspecified open wound, right lower leg, initial encounter: Secondary | ICD-10-CM

## 2021-08-08 NOTE — Assessment & Plan Note (Signed)
Previously well controlled Continue statin Repeat FLP and CMP Goal LDL < 70 

## 2021-08-08 NOTE — Assessment & Plan Note (Signed)
Chronic and stable F/b psych No med changes today Looking into therapy

## 2021-08-08 NOTE — Assessment & Plan Note (Signed)
New wound of posterior R thigh Could be related to venous stasis, but worried about possible SCC Will refer to Derm for further eval Skin managmenet with [facility] RN I nthe meantime

## 2021-08-08 NOTE — Assessment & Plan Note (Signed)
Longstanding problem, also with lymphedema No current ulcerations, but significant skin breakdown Will get [facility] RN back out to help in the home Unable to apply compression stocking, but compression dressings may be helpful

## 2021-08-08 NOTE — Assessment & Plan Note (Signed)
Congratulated on weiht loss Discussed importance of healthy weight management Discussed diet and exercise

## 2021-08-08 NOTE — Assessment & Plan Note (Signed)
Well controlled previously Recheck Y1O Assoc with/complicated by HTN, HLD Continue current medications UTD on vaccines, eye exam (going to schedule appt), foot exam (completed today) Uacr today On Statin Discussed diet and exercise F/u in 6 months

## 2021-08-08 NOTE — Assessment & Plan Note (Signed)
Well controlled Continue current medications Recheck metabolic panel F/u in 6 months  

## 2021-08-08 NOTE — Assessment & Plan Note (Signed)
See plan for venous stasis above

## 2021-08-08 NOTE — Assessment & Plan Note (Signed)
Previously with slightly elevated TSH Continue Synthroid at current dose  Recheck TSH and adjust Synthroid as indicated

## 2021-08-09 LAB — COMPREHENSIVE METABOLIC PANEL
ALT: 15 IU/L (ref 0–32)
AST: 20 IU/L (ref 0–40)
Albumin/Globulin Ratio: 1.3 (ref 1.2–2.2)
Albumin: 4.3 g/dL (ref 3.8–4.8)
Alkaline Phosphatase: 37 IU/L — ABNORMAL LOW (ref 44–121)
BUN/Creatinine Ratio: 14 (ref 12–28)
BUN: 19 mg/dL (ref 8–27)
Bilirubin Total: 0.7 mg/dL (ref 0.0–1.2)
CO2: 23 mmol/L (ref 20–29)
Calcium: 9.9 mg/dL (ref 8.7–10.3)
Chloride: 97 mmol/L (ref 96–106)
Creatinine, Ser: 1.33 mg/dL — ABNORMAL HIGH (ref 0.57–1.00)
Globulin, Total: 3.4 g/dL (ref 1.5–4.5)
Glucose: 124 mg/dL — ABNORMAL HIGH (ref 70–99)
Potassium: 3.9 mmol/L (ref 3.5–5.2)
Sodium: 137 mmol/L (ref 134–144)
Total Protein: 7.7 g/dL (ref 6.0–8.5)
eGFR: 43 mL/min/{1.73_m2} — ABNORMAL LOW (ref >59)

## 2021-08-09 LAB — LIPID PANEL WITH LDL/HDL RATIO
Cholesterol, Total: 156 mg/dL (ref 100–199)
HDL: 47 mg/dL (ref >39)
LDL Chol Calc (NIH): 85 mg/dL (ref 0–99)
LDL/HDL Ratio: 1.8 ratio (ref 0.0–3.2)
Triglycerides: 134 mg/dL (ref 0–149)
VLDL Cholesterol Cal: 24 mg/dL (ref 5–40)

## 2021-08-09 LAB — HEMOGLOBIN A1C
Est. average glucose Bld gHb Est-mCnc: 131 mg/dL
Hgb A1c MFr Bld: 6.2 % — ABNORMAL HIGH (ref 4.8–5.6)

## 2021-08-09 LAB — MICROALBUMIN / CREATININE URINE RATIO
Creatinine, Urine: 137.8 mg/dL
Microalb/Creat Ratio: 17 mg/g creat (ref 0–29)
Microalbumin, Urine: 22.9 ug/mL

## 2021-08-09 LAB — TSH: TSH: 6.81 u[IU]/mL — ABNORMAL HIGH (ref 0.450–4.500)

## 2021-08-11 DIAGNOSIS — E1159 Type 2 diabetes mellitus with other circulatory complications: Secondary | ICD-10-CM | POA: Diagnosis not present

## 2021-08-11 DIAGNOSIS — Z6841 Body Mass Index (BMI) 40.0 and over, adult: Secondary | ICD-10-CM | POA: Diagnosis not present

## 2021-08-11 DIAGNOSIS — E785 Hyperlipidemia, unspecified: Secondary | ICD-10-CM | POA: Diagnosis not present

## 2021-08-11 DIAGNOSIS — I890 Lymphedema, not elsewhere classified: Secondary | ICD-10-CM | POA: Diagnosis not present

## 2021-08-11 DIAGNOSIS — Z7984 Long term (current) use of oral hypoglycemic drugs: Secondary | ICD-10-CM | POA: Diagnosis not present

## 2021-08-11 DIAGNOSIS — I152 Hypertension secondary to endocrine disorders: Secondary | ICD-10-CM | POA: Diagnosis not present

## 2021-08-11 DIAGNOSIS — E1151 Type 2 diabetes mellitus with diabetic peripheral angiopathy without gangrene: Secondary | ICD-10-CM | POA: Diagnosis not present

## 2021-08-11 DIAGNOSIS — E1169 Type 2 diabetes mellitus with other specified complication: Secondary | ICD-10-CM | POA: Diagnosis not present

## 2021-08-11 DIAGNOSIS — F331 Major depressive disorder, recurrent, moderate: Secondary | ICD-10-CM | POA: Diagnosis not present

## 2021-08-11 DIAGNOSIS — F411 Generalized anxiety disorder: Secondary | ICD-10-CM | POA: Diagnosis not present

## 2021-08-11 DIAGNOSIS — E034 Atrophy of thyroid (acquired): Secondary | ICD-10-CM | POA: Diagnosis not present

## 2021-08-11 DIAGNOSIS — M4800 Spinal stenosis, site unspecified: Secondary | ICD-10-CM | POA: Diagnosis not present

## 2021-08-11 DIAGNOSIS — M810 Age-related osteoporosis without current pathological fracture: Secondary | ICD-10-CM | POA: Diagnosis not present

## 2021-08-11 DIAGNOSIS — I872 Venous insufficiency (chronic) (peripheral): Secondary | ICD-10-CM | POA: Diagnosis not present

## 2021-08-13 ENCOUNTER — Other Ambulatory Visit: Payer: Self-pay

## 2021-08-13 ENCOUNTER — Telehealth: Payer: Self-pay | Admitting: *Deleted

## 2021-08-13 ENCOUNTER — Telehealth: Payer: Self-pay

## 2021-08-13 DIAGNOSIS — E034 Atrophy of thyroid (acquired): Secondary | ICD-10-CM

## 2021-08-13 DIAGNOSIS — E1159 Type 2 diabetes mellitus with other circulatory complications: Secondary | ICD-10-CM

## 2021-08-13 MED ORDER — LEVOTHYROXINE SODIUM 100 MCG PO TABS: 100.0000 ug | ORAL_TABLET | Freq: Every day | ORAL | 1 refills | Status: DC

## 2021-08-13 NOTE — Telephone Encounter (Signed)
Copied from [place]. Topic: General - Other >> Aug 13, 2021 11:03 AM Erick Blinks wrote: Reason for CRM: Nevin Bloodgood from Dr. Waylan Boga office is requesting for the office to send the most recent lab results for the patient   Best contact: [phone] Fax: [phone]

## 2021-08-13 NOTE — Telephone Encounter (Unsigned)
Copied from [place] [id number]. Topic: Quick Communication - Home Health Verbal Orders >> Aug 13, 2021 12:44 PM McGill, Nelva Bush wrote: Caller/Agency: [facility] Callback Number: [phone] Requesting OT/PT/Skilled Nursing/Social Work/Speech Therapy: PT/OT (orders to evaluate and treat for therapy)/ Nursing for 1w3 for observation and assessment. Christina Rubio stated It's not a wound on the right side, back of the right thigh; it's a growth. Pt needs to see a dermatologist.

## 2021-08-15 NOTE — Telephone Encounter (Signed)
Left detailed voicemail advising as below.  

## 2021-08-15 NOTE — Telephone Encounter (Signed)
We can send, but need to make sure patient has signed release

## 2021-08-15 NOTE — Telephone Encounter (Signed)
She was referred to [facility] for that spot on R thigh at last visit.  Nursing is for lower legs.  [place] for [employer]

## 2021-08-20 ENCOUNTER — Other Ambulatory Visit: Payer: Self-pay | Admitting: Family Medicine

## 2021-08-20 ENCOUNTER — Ambulatory Visit: Payer: Self-pay | Admitting: *Deleted

## 2021-08-20 ENCOUNTER — Telehealth: Payer: Self-pay | Admitting: Family Medicine

## 2021-08-20 DIAGNOSIS — E034 Atrophy of thyroid (acquired): Secondary | ICD-10-CM

## 2021-08-20 MED ORDER — LEVOTHYROXINE SODIUM 112 MCG PO TABS: 112.0000 ug | ORAL_TABLET | Freq: Every day | ORAL | 1 refills | Status: DC

## 2021-08-20 NOTE — Telephone Encounter (Signed)
Pt states she has seen Dr. Brita Romp for pink eye before. She has a little bit of Gentamicin drops left but would like a new bottle called in. Pt was seen by Dr. Brita Romp last week.

## 2021-08-20 NOTE — Telephone Encounter (Signed)
Message from Marlis Edelson sent at 08/20/2021  9:18 AM EDT  Summary: left eye irritaiton   The patient has a history of pink eye concerns   The patient woke up yesterday 08/19/21 with left eye irritation   The patient would like to be prescribed something for their discomfort   Please contact further when possible

## 2021-08-20 NOTE — Telephone Encounter (Signed)
Patient's Levothyroxine was supposed to be increased per result note below. Noted it was sent to the pharmacy, however in the chart the 100 mcg was sent on 08/13/21. Patient will need a new Rx sent in as below or call and let her know if it is to stay the same.    [name], MD  08/09/2021  9:15 AM EDT     Normal/stable labs, except TSH remains mildly elevated and slight decrease in kidney function.  Recommend increasing Synthroid dose to 112 mcg daily.  Recheck BMP and TSH in 2 months.  Be sure to hydrate well and avoid NSAIDs.   Dorian Pod, [place]  08/13/2021 10:58 AM EDT Back to Top    Patient aware. Medication sent to pharmacy. Labs ordered

## 2021-08-20 NOTE — Telephone Encounter (Signed)
Copied from [place] [id number]. Topic: General - Other >> Aug 20, 2021  9:13 AM Tessa Lerner A wrote: Reason for CRM: The patient has called to check with their PCP on a previously discussed medication   The patient was told at their last visit that their levothyroxine (SYNTHROID) 100 MCG tablet [[id number]]  would be increased   The patient has received their newest refill of the medication and the dose remains the same   The patient would like to speak with a member of clinical staff when possible regarding the previously discussed increase   Please contact when possible

## 2021-08-20 NOTE — Telephone Encounter (Signed)
Patient called, left VM to return the call to the office to discuss symptoms with a nurse.  Summary: left eye irritaiton   The patient has a history of pink eye concerns   The patient woke up yesterday 08/19/21 with left eye irritation   The patient would like to be prescribed something for their discomfort   Please contact further when possible

## 2021-08-20 NOTE — Telephone Encounter (Signed)
3rd attempt to reach pt. , left VM to call back each time. Routing to practice for review and PCPs final disposition per protocol.

## 2021-08-20 NOTE — Telephone Encounter (Signed)
Left message to call back to discuss eye issues with a nurse.

## 2021-08-22 ENCOUNTER — Telehealth (INDEPENDENT_AMBULATORY_CARE_PROVIDER_SITE_OTHER): Payer: Medicare Other | Admitting: Family Medicine

## 2021-08-22 ENCOUNTER — Encounter: Payer: Self-pay | Admitting: Family Medicine

## 2021-08-22 DIAGNOSIS — B9689 Other specified bacterial agents as the cause of diseases classified elsewhere: Secondary | ICD-10-CM

## 2021-08-22 DIAGNOSIS — H109 Unspecified conjunctivitis: Secondary | ICD-10-CM

## 2021-08-22 MED ORDER — OFLOXACIN 0.3 % OP SOLN: 1.0000 [drp] | Freq: Four times a day (QID) | OPHTHALMIC | 0 refills | Status: AC

## 2021-08-22 NOTE — Progress Notes (Signed)
I,Sulibeya S Dimas,acting as a Education administrator for Lavon Paganini, MD.,have documented all relevant documentation on the behalf of Lavon Paganini, MD,as directed by  Lavon Paganini, MD while in the presence of Lavon Paganini, MD.  MyChart Video Visit    Virtual Visit via Video Note   This visit type was conducted due to national recommendations for restrictions regarding the COVID-19 Pandemic (e.g. social distancing) in an effort to limit this patient's exposure and mitigate transmission in our community. This patient is at least at moderate risk for complications without adequate follow up. This format is felt to be most appropriate for this patient at this time. Physical exam was limited by quality of the video and audio technology used for the visit.    Patient location: home Provider location: [place] involved in the visit: patient, provider   I discussed the limitations of evaluation and management by telemedicine and the availability of in person appointments. The patient expressed understanding and agreed to proceed.  Patient: Christina Rubio   DOB: [birth year]   71 y.o. Female  MRN: [id number] Visit Date: 08/22/2021  Today's healthcare provider: Lavon Paganini, MD   Chief Complaint  Patient presents with   Eye Drainage   Subjective    Conjunctivitis  The current episode started 5 to 7 days ago. The onset was gradual. The problem occurs frequently. The problem has been unchanged. The symptoms are relieved by one or more prescription drugs. Nothing aggravates the symptoms. Associated symptoms include congestion, eye discharge, eye pain and eye redness. Pertinent negatives include no fever, no eye itching and no cough. The eye pain is mild. Both eyes are affected.    Started 5/29. Used old drops and no effect. R>L. A lot of drainage throughout the day.  COVID test negative   Medications: Outpatient Medications Prior to Visit  Medication  Sig   ALPRAZolam (XANAX) 0.5 MG tablet TAKE 1 TABLET BY MOUTH EVERY DAY AS NEEDED FOR ANXIETY.   atorvastatin (LIPITOR) 20 MG tablet TAKE 1 TABLET BY MOUTH DAILY   blood glucose meter kit and supplies Dispense based on patient and insurance preference. Use once daily to check for fasting blood sugars. (FOR ICD-10 E10.9, E11.9).   levothyroxine (SYNTHROID) 112 MCG tablet Take 1 tablet (112 mcg total) by mouth daily.   loperamide (IMODIUM A-D) 2 MG tablet Take 1 tablet (2 mg total) by mouth 4 (four) times daily as needed for diarrhea or loose stools.   metFORMIN (GLUCOPHAGE-XR) 500 MG 24 hr tablet TAKE ONE TABLET EVERY DAY WITH BREAKFAST   metoprolol succinate (TOPROL-XL) 50 MG 24 hr tablet TAKE 1 TABLET BY MOUTH DAILY WITH OR IMMEDIATELY FOLLOWING A MEAL   Multiple Vitamins-Minerals (PRESERVISION AREDS 2) CAPS Take 1 tablet by mouth 2 (two) times daily.   ondansetron (ZOFRAN ODT) 4 MG disintegrating tablet Take 1 tablet (4 mg total) by mouth every 8 (eight) hours as needed.   Probiotic Product (PROBIOTIC PO) Take by mouth daily.    sertraline (ZOLOFT) 100 MG tablet Take 2 tablets (200 mg total) by mouth daily.   triamcinolone ointment (KENALOG) 0.5 % APPLY TO AFFECTED AREAS TOPICALLY TWICE DAILY   valsartan-hydrochlorothiazide (DIOVAN-HCT) 160-25 MG tablet TAKE 1 TABLET BY MOUTH NIGHTLY   [DISCONTINUED] gentamicin (GARAMYCIN) 0.3 % ophthalmic solution Place 1 drop into both eyes every 4 (four) hours while awake.   No facility-administered medications prior to visit.    Review of Systems  Constitutional:  Negative for fever.  HENT:  Positive for  congestion.   Eyes:  Positive for pain, discharge and redness. Negative for itching.  Respiratory:  Negative for cough.        Objective    There were no vitals taken for this visit.      Physical Exam Constitutional:      General: She is not in acute distress.    Appearance: Normal appearance.  HENT:     Head: Normocephalic.  Eyes:      Extraocular Movements: Extraocular movements intact.     Conjunctiva/sclera:     Right eye: Right conjunctiva is injected.     Left eye: Left conjunctiva is injected.  Pulmonary:     Effort: Pulmonary effort is normal. No respiratory distress.  Neurological:     Mental Status: She is alert and oriented to person, place, and time. Mental status is at baseline.       Assessment & Plan     1. Bacterial conjunctivitis of both eyes - appearance consistent with bacterial conjunctivitis - no red flags for complications - will treat with abx drops - not a contact wearer - discussed preventing spread   Meds ordered this encounter  Medications   ofloxacin (OCUFLOX) 0.3 % ophthalmic solution    Sig: Place 1 drop into both eyes 4 (four) times daily for 5 days.    Dispense:  5 mL    Refill:  0     Return if symptoms worsen or fail to improve.     I discussed the assessment and treatment plan with the patient. The patient was provided an opportunity to ask questions and all were answered. The patient agreed with the plan and demonstrated an understanding of the instructions.   The patient was advised to call back or seek an in-person evaluation if the symptoms worsen or if the condition fails to improve as anticipated.  I, Lavon Paganini, MD, have reviewed all documentation for this visit. The documentation on 08/22/21 for the exam, diagnosis, procedures, and orders are all accurate and complete.   Sylena Lotter, Dionne Bucy, MD, MPH [facility]

## 2021-08-26 ENCOUNTER — Telehealth: Payer: Self-pay | Admitting: Family Medicine

## 2021-08-26 NOTE — Telephone Encounter (Signed)
Called Dr. Waylan Boga office to request ROI signed from patient.

## 2021-08-26 NOTE — Telephone Encounter (Signed)
Left detailed message advising of approved orders.  

## 2021-08-26 NOTE — Telephone Encounter (Signed)
OK for verbals 

## 2021-08-26 NOTE — Telephone Encounter (Signed)
Joey calling from [facility] is calling to request PT with a frequency of 1 w 3 to establish to a home exercise program CB- [phone] [facility] to leave verbal on VM

## 2021-08-27 ENCOUNTER — Other Ambulatory Visit (INDEPENDENT_AMBULATORY_CARE_PROVIDER_SITE_OTHER): Payer: Medicare Other | Admitting: Family Medicine

## 2021-08-27 DIAGNOSIS — I878 Other specified disorders of veins: Secondary | ICD-10-CM | POA: Diagnosis not present

## 2021-08-27 DIAGNOSIS — I152 Hypertension secondary to endocrine disorders: Secondary | ICD-10-CM

## 2021-08-27 DIAGNOSIS — I890 Lymphedema, not elsewhere classified: Secondary | ICD-10-CM

## 2021-08-27 DIAGNOSIS — E785 Hyperlipidemia, unspecified: Secondary | ICD-10-CM

## 2021-08-27 DIAGNOSIS — Z7984 Long term (current) use of oral hypoglycemic drugs: Secondary | ICD-10-CM

## 2021-08-27 DIAGNOSIS — F411 Generalized anxiety disorder: Secondary | ICD-10-CM

## 2021-08-27 DIAGNOSIS — E1169 Type 2 diabetes mellitus with other specified complication: Secondary | ICD-10-CM | POA: Diagnosis not present

## 2021-08-27 DIAGNOSIS — F331 Major depressive disorder, recurrent, moderate: Secondary | ICD-10-CM

## 2021-08-27 DIAGNOSIS — R600 Localized edema: Secondary | ICD-10-CM | POA: Diagnosis not present

## 2021-08-27 DIAGNOSIS — E034 Atrophy of thyroid (acquired): Secondary | ICD-10-CM

## 2021-08-27 DIAGNOSIS — Z6841 Body Mass Index (BMI) 40.0 and over, adult: Secondary | ICD-10-CM

## 2021-08-27 DIAGNOSIS — E1159 Type 2 diabetes mellitus with other circulatory complications: Secondary | ICD-10-CM

## 2021-08-27 DIAGNOSIS — M4807 Spinal stenosis, lumbosacral region: Secondary | ICD-10-CM

## 2021-08-27 NOTE — Progress Notes (Signed)
Received home health orders orders from [facility]. Start of care 08/11/2021.   Certification and orders from 08/11/2021 through 10/09/2021 are reviewed, signed and faxed back to home health company.  Need of intermittent skilled services at home: Unable to leave home safely without assistance  The home health care plan has been established by me and will be reviewed and updated as needed to maximize patient recovery.  I certify that all home health services have been and will be furnished to the patient while under my care.  Face-to-face encounter in which the need for home health services was established: See office visit note from 08/08/2021  Patient is receiving home health services for the following diagnoses: Problem List Items Addressed This Visit       Cardiovascular and Mediastinum   Hypertension associated with diabetes ([place])     Endocrine   Hyperlipidemia associated with type 2 diabetes mellitus ([place])   Hypothyroidism due to acquired atrophy of thyroid   T2DM (type 2 diabetes mellitus) ([place]) - Primary     Other   Morbid obesity ([place]) (Chronic)   MDD (major depressive disorder)   Spinal stenosis   Venous stasis   GAD (generalized anxiety disorder)   Bilateral lower extremity edema   Lymphedema   BMI 60.0-69.9, adult ([place])   Other Visit Diagnoses     Long term (current) use of oral hypoglycemic drugs            Lavon Paganini, MD

## 2021-08-30 ENCOUNTER — Other Ambulatory Visit: Payer: Self-pay | Admitting: Nurse Practitioner

## 2021-08-30 ENCOUNTER — Ambulatory Visit: Payer: Self-pay

## 2021-08-30 DIAGNOSIS — H1032 Unspecified acute conjunctivitis, left eye: Secondary | ICD-10-CM

## 2021-08-30 MED ORDER — POLYMYXIN B-TRIMETHOPRIM 10000-0.1 UNIT/ML-% OP SOLN: 2.0000 [drp] | OPHTHALMIC | 0 refills | Status: AC

## 2021-08-30 NOTE — Telephone Encounter (Signed)
Patient advised. She denies any abnormal visual changes or pain.

## 2021-08-30 NOTE — Telephone Encounter (Signed)
Reason for Disposition  [1] Caller has URGENT medicine question about med that PCP or specialist prescribed AND [2] triager unable to answer question    Eye drops not working for pink eye after a week  Answer Assessment - Initial Assessment Questions 1. NAME of MEDICATION: "What medicine are you calling about?"     Eye drops not working   Offoxacin not working.     2. QUESTION: "What is your question?" (e.g., double dose of medicine, side effect)      My right eye is no better.   The pink eye is no better and it's been a week.   She gave me Gentamycin Sulfate  3% when I had this before and it cleared the infection up.  Can she prescribe this for me again?   3. PRESCRIBING HCP: "Who prescribed it?" Reason: if prescribed by specialist, call should be referred to that group.     Dr. Brita Romp 4. SYMPTOMS: "Do you have any symptoms?"     Right pink eye is no better after a week. 5. SEVERITY: If symptoms are present, ask "Are they mild, moderate or severe?"     No change 6. PREGNANCY:  "Is there any chance that you are pregnant?" "When was your last menstrual period?"     N/A  Protocols used: Medication Question [place]  Chief Complaint: Current eye drops not helping pink eye after a week.   Will she prescribe Gentamycin Sulfate 3% for me?   That worked last time real well.  Symptoms: Right pink eye no better after a week Frequency: N/A Pertinent Negatives: Patient denies N/A Disposition: '[]'$ ED /'[]'$ Urgent Care (no appt availability in office) / '[]'$ Appointment(In office/virtual)/ '[]'$  [place] Virtual Care/ '[]'$ Home Care/ '[]'$ Refused Recommended Disposition /'[]'$ [place] [facility]/ '[x]'$  Follow-up with PCP Additional Notes: Message sent to Dr. Brita Romp

## 2021-08-30 NOTE — Telephone Encounter (Signed)
Summary: eye drop, not working   Pt called in stating she was recently given some eye drops, but states they are not really working, and wanted to know what else she can do, please advise.     Called pt LMOM to return call

## 2021-09-11 ENCOUNTER — Ambulatory Visit: Payer: Medicare Other | Admitting: Physician Assistant

## 2021-09-20 ENCOUNTER — Ambulatory Visit: Payer: Medicare Other

## 2021-09-20 DIAGNOSIS — E1169 Type 2 diabetes mellitus with other specified complication: Secondary | ICD-10-CM

## 2021-09-20 DIAGNOSIS — I152 Hypertension secondary to endocrine disorders: Secondary | ICD-10-CM

## 2021-09-20 NOTE — Chronic Care Management (AMB) (Signed)
Chronic Care Management   CCM RN Visit Note  09/20/2021 Name: Christina Rubio MRN: [id number] DOB: 02-25-1951  Subjective: Logan Vegh is a 71 y.o. year old female who is a primary care patient of Bacigalupo, Dionne Bucy, MD. The care management team was consulted for assistance with disease management and care coordination needs.    Engaged with patient by telephone for follow up visit in response to provider referral for case management and care coordination services.   Consent to Services:  The patient was given information about Chronic Care Management services, agreed to services, and gave verbal consent prior to initiation of services.  Please see initial visit note for detailed documentation.   Patient agreed to services and verbal consent obtained.   Assessment: Review of patient past medical history, allergies, medications, health status, including review of consultants reports, laboratory and other test data, was performed as part of comprehensive evaluation and provision of chronic care management services.   SDOH (Social Determinants of Health) assessments and interventions performed: No  CCM Care Plan  Allergies  Allergen Reactions   Shellfish Allergy     Patient reported.   Epinephrine Palpitations    faint   Penicillins Rash    Has patient had a PCN reaction causing immediate rash, facial/tongue/throat swelling, SOB or lightheadedness with hypotension:  Yes Has patient had a PCN reaction causing severe rash involving mucus membranes or skin necrosis: No Has patient had a PCN reaction that required hospitalization: No Has patient had a PCN reaction occurring within the last 10 years: No If all of the above answers are "NO", then may proceed with Cephalosporin use.;   Strawberry Extract Rash    Outpatient Encounter Medications as of 09/20/2021  Medication Sig   ALPRAZolam (XANAX) 0.5 MG tablet TAKE 1 TABLET BY MOUTH EVERY DAY AS NEEDED FOR ANXIETY.   atorvastatin  (LIPITOR) 20 MG tablet TAKE 1 TABLET BY MOUTH DAILY   blood glucose meter kit and supplies Dispense based on patient and insurance preference. Use once daily to check for fasting blood sugars. (FOR ICD-10 E10.9, E11.9).   levothyroxine (SYNTHROID) 112 MCG tablet Take 1 tablet (112 mcg total) by mouth daily.   loperamide (IMODIUM A-D) 2 MG tablet Take 1 tablet (2 mg total) by mouth 4 (four) times daily as needed for diarrhea or loose stools.   metFORMIN (GLUCOPHAGE-XR) 500 MG 24 hr tablet TAKE ONE TABLET EVERY DAY WITH BREAKFAST   metoprolol succinate (TOPROL-XL) 50 MG 24 hr tablet TAKE 1 TABLET BY MOUTH DAILY WITH OR IMMEDIATELY FOLLOWING A MEAL   Multiple Vitamins-Minerals (PRESERVISION AREDS 2) CAPS Take 1 tablet by mouth 2 (two) times daily.   ondansetron (ZOFRAN ODT) 4 MG disintegrating tablet Take 1 tablet (4 mg total) by mouth every 8 (eight) hours as needed.   Probiotic Product (PROBIOTIC PO) Take by mouth daily.    sertraline (ZOLOFT) 100 MG tablet Take 2 tablets (200 mg total) by mouth daily.   triamcinolone ointment (KENALOG) 0.5 % APPLY TO AFFECTED AREAS TOPICALLY TWICE DAILY   valsartan-hydrochlorothiazide (DIOVAN-HCT) 160-25 MG tablet TAKE 1 TABLET BY MOUTH NIGHTLY   No facility-administered encounter medications on file as of 09/20/2021.    Patient Active Problem List   Diagnosis Date Noted   Non-healing wound of lower extremity 08/08/2021   Poor dentition 12/04/2020   BMI 60.0-69.9, adult ([place]) 12/04/2020   Lymphedema 05/23/2020   Bilateral lower extremity edema 01/16/2020   Esophageal dysphagia 01/13/2019   Toenail deformity 01/13/2019   Chest pain  09/22/2018   GAD (generalized anxiety disorder) 05/24/2018   T2DM (type 2 diabetes mellitus) ([place]) 05/24/2018   Venous stasis 07/28/2017   Spinal stenosis 05/28/2017   DJD (degenerative joint disease) 05/28/2017   Hypertension associated with diabetes ([place]) 02/10/2016   Hyperlipidemia associated with type 2 diabetes mellitus  ([place]) 02/10/2016   MDD (major depressive disorder) 02/10/2016   Hypothyroidism due to acquired atrophy of thyroid 02/10/2016   Morbid obesity ([place]) 01/09/2016    Patient Care Plan: RN Care Manager Plan of Care     Problem Identified: DM, HTN and Fall Risk      Long-Range Goal: Disease Progression Prevented or Minimized   Priority: High  Note:   Current Barriers:  Chronic Disease Management support and education needs related to HTN, DMII, and  RNCM Clinical Goal(s):  Patient will demonstrate Ongoing adherence to prescribed treatment plan for HTN,  DMII and Falls through collaboration with RN Care manager, provider, and care team.   Interventions: 1:1 collaboration with primary care provider regarding development and update of comprehensive plan of care as evidenced by provider attestation and co-signature Inter-disciplinary care team collaboration (see longitudinal plan of care) Evaluation of current treatment plan related to  self management and patient's adherence to plan as established by provider   Diabetes Interventions: Assessed patient's understanding of A1c goal: <7% Lab Results  Component Value Date   HGBA1C 6.2 (H) 08/08/2021  Reviewed plan for diabetes management. Reports compliance with medications and current plan. Advised to monitor blood glucose readings and maintain a log. Denies recent hypoglycemic or hyperglycemic episodes.  A1C is currently at goal. Declined need for further interventions.   Hypertension Interventions:  Last practice recorded BP readings:  BP Readings from Last 3 Encounters:  08/08/21 117/72  12/04/20 (!) 131/53  08/09/20 129/62  Most recent eGFR/CrCl:  Lab Results  Component Value Date   EGFR 43 (L) 08/08/2021    No components found for: "CRCL"  Reviewed plan for hypertension management. Reports taking all medications as prescribed. Reports BP readings have been within range. Reviewed parameters for notifying a provider.  Reviewed  nutritional intake. Advised to read nutrition labels and avoid highly processed foods when possible.  Reviewed complications of uncontrolled blood pressure. Reviewed s/sx of heart attack, stroke and worsening symptoms that require immediate medical attention.   Fall Risk Interventions: Reviewed safety and fall prevention measures. Reports using recommended precautions. Denies recent falls.  Discussed plan for physical therapy. A referral was previously placed for home health services. Confirmed being engaged with [facility] but indicated home visits recently stopped. Contacted the [facility]. Staff confirmed that visits would be discontinued d/t frequent cancellations and lack of progression with goals. Staff indicated that they will gladly reopen her case if she is interested in reengaging. A new order will be required. 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 Confirms frequent cancellations with the home health staff. She will contact the clinic if she opts to restart physical therapy services.   Patient Goals/Self-Care Activities: Take all medications as prescribed Attend all scheduled provider appointments Call pharmacy for medication refills 3-7 days in advance of running out of medications Perform all self care activities independently         PLAN: Ms. Raether will contact the clinic if she requires additional outreach. The care management team will gladly assist.  [facility] Care Management [phone]

## 2021-10-01 DIAGNOSIS — F331 Major depressive disorder, recurrent, moderate: Secondary | ICD-10-CM | POA: Diagnosis not present

## 2021-10-01 DIAGNOSIS — F5105 Insomnia due to other mental disorder: Secondary | ICD-10-CM | POA: Diagnosis not present

## 2021-10-01 DIAGNOSIS — F410 Panic disorder [episodic paroxysmal anxiety] without agoraphobia: Secondary | ICD-10-CM | POA: Diagnosis not present

## 2021-10-01 DIAGNOSIS — F411 Generalized anxiety disorder: Secondary | ICD-10-CM | POA: Diagnosis not present

## 2021-10-28 ENCOUNTER — Ambulatory Visit: Payer: Self-pay | Admitting: *Deleted

## 2021-10-28 NOTE — Telephone Encounter (Signed)
Summary: cold and flu like symptoms / rx req   The patient tested positive for COVID 19 last night 10/27/21   The patient had not been feeling well since 10/25/21   The patient is currently experiencing an elevated temperature, congestion, cough and stomach discomfort   The patient has had little to no appetite   The patient would like to be prescribed something for their discomfort   Please contact further      Message left to call back to speak with a nurse or make an appt.

## 2021-10-28 NOTE — Telephone Encounter (Signed)
Late entry. Reviewed covid isolation precautions with patient during previous encounter.  Criteria for self-isolation if you test positive for COVID-19, regardless of vaccination status:  -If you have mild symptoms that are resolving or have resolved, isolate at home for 5 days since symptoms started AND continue to wear a well-fitted mask when around others in the home and in public for 5 additional days after isolation is completed -If you have a fever and/or moderate to severe symptoms, isolate for at least 10 days since the symptoms started AND until you are fever free for at least 24 hours without the use of fever-reducing medications -If you tested positive and did not have symptoms, isolate for at least 5 days after your positive test  Use over-the-counter medications for symptoms.If you develop respiratory issues/distress, seek medical care in the Emergency Department.

## 2021-10-28 NOTE — Telephone Encounter (Signed)
  Chief Complaint: positive covid at home test 10/27/21 Symptoms: dry cough, congestion, fever, stomach discomfort, no appetite. Shortness of breath with exertion. Frequency: sx started 10/25/21 Pertinent Negatives: Patient denies chest pain pressure, no difficulty breathing,  Disposition: '[]'$ ED /'[]'$ Urgent Care (no appt availability in office) / '[x]'$ Appointment(In office/virtual)/ '[]'$  [place] Virtual Care/ '[]'$ Home Care/ '[]'$ Refused Recommended Disposition /'[]'$ [place] [facility]/ '[]'$  Follow-up with PCP Additional Notes:   My Chart VV scheduled for 10/29/21. Please advise if patient can do VV today. No transportation available for patient and needs VV.    Reason for Disposition  MILD difficulty breathing (e.g., minimal/no SOB at rest, SOB with walking, pulse <100)  Answer Assessment - Initial Assessment Questions 1. COVID-19 DIAGNOSIS: "How do you know that you have COVID?" (e.g., positive lab test or self-test, diagnosed by doctor or NP/PA, symptoms after exposure).     At home test positive for covid 10/27/21 2. COVID-19 EXPOSURE: "Was there any known exposure to COVID before the symptoms began?" CDC Definition of close contact: within 6 feet (2 meters) for a total of 15 minutes or more over a 24-hour period.      na 3. ONSET: "When did the COVID-19 symptoms start?"      10/25/21 4. WORST SYMPTOM: "What is your worst symptom?" (e.g., cough, fever, shortness of breath, muscle aches)     Muscles aches ,fever, cough, congestion sinus pain stomach discomfort  5. COUGH: "Do you have a cough?" If Yes, ask: "How bad is the cough?"       Dry  6. FEVER: "Do you have a fever?" If Yes, ask: "What is your temperature, how was it measured, and when did it start?"     99 yesterday  7. RESPIRATORY STATUS: "Describe your breathing?" (e.g., normal; shortness of breath, wheezing, unable to speak)      Shortness of breath with exertion  8. BETTER-SAME-WORSE: "Are you getting better, staying the same or getting worse  compared to yesterday?"  If getting worse, ask, "In what way?"     Achy all over worse today  9. OTHER SYMPTOMS: "Do you have any other symptoms?"  (e.g., chills, fatigue, headache, loss of smell or taste, muscle pain, sore throat)     Fever, headache sinus pain , muscle pain cough dry congestion, stomach discomfort  10. HIGH RISK DISEASE: "Do you have any chronic medical problems?" (e.g., asthma, heart or lung disease, weak immune system, obesity, etc.)       na 11. VACCINE: "Have you had the COVID-19 vaccine?" If Yes, ask: "Which one, how many shots, when did you get it?"       Yes Pfizer  12. PREGNANCY: "Is there any chance you are pregnant?" "When was your last menstrual period?"       na 13. O2 SATURATION MONITOR:  "Do you use an oxygen saturation monitor (pulse oximeter) at home?" If Yes, ask "What is your reading (oxygen level) today?" "What is your usual oxygen saturation reading?" (e.g., 95%)       na  Protocols used: Coronavirus (COVID-19) Diagnosed or Suspected-A-AH

## 2021-10-29 ENCOUNTER — Telehealth: Payer: Self-pay

## 2021-10-29 ENCOUNTER — Telehealth (INDEPENDENT_AMBULATORY_CARE_PROVIDER_SITE_OTHER): Payer: Medicare Other | Admitting: Family Medicine

## 2021-10-29 VITALS — Temp 99.0°F

## 2021-10-29 DIAGNOSIS — U071 COVID-19: Secondary | ICD-10-CM | POA: Diagnosis not present

## 2021-10-29 MED ORDER — NIRMATRELVIR/RITONAVIR (PAXLOVID) TABLET (RENAL DOSING): 2.0000 | ORAL_TABLET | Freq: Two times a day (BID) | ORAL | 0 refills | Status: AC

## 2021-10-29 NOTE — Progress Notes (Signed)
MyChart Video Visit    Virtual Visit via Video Note   This visit type was conducted due to national recommendations for restrictions regarding the COVID-19 Pandemic (e.g. social distancing) in an effort to limit this patient's exposure and mitigate transmission in our community. This patient is at least at moderate risk for complications without adequate follow up. This format is felt to be most appropriate for this patient at this time. Physical exam was limited by quality of the video and audio technology used for the visit.   Patient location: home Provider location: [facility]  I discussed the limitations of evaluation and management by telemedicine and the availability of in person appointments. The patient expressed understanding and agreed to proceed.  Patient: Christina Rubio   DOB: 09-22-1950   71 y.o. Female  MRN: [id number] Visit Date: 10/29/2021  Today's healthcare provider: Lelon Huh, MD   No chief complaint on file.  Subjective    HPI  Patient tested positive for Covid last night with a home test. Patient has symptoms of fever (99-101), cough, congestion and body aches. Patient has not taken any medications to treat symptoms. Sx started Friday and worsening over the weekend.    Medications: Outpatient Medications Prior to Visit  Medication Sig   ALPRAZolam (XANAX) 0.5 MG tablet TAKE 1 TABLET BY MOUTH EVERY DAY AS NEEDED FOR ANXIETY.   atorvastatin (LIPITOR) 20 MG tablet TAKE 1 TABLET BY MOUTH DAILY   blood glucose meter kit and supplies Dispense based on patient and insurance preference. Use once daily to check for fasting blood sugars. (FOR ICD-10 E10.9, E11.9).   levothyroxine (SYNTHROID) 112 MCG tablet Take 1 tablet (112 mcg total) by mouth daily.   loperamide (IMODIUM A-D) 2 MG tablet Take 1 tablet (2 mg total) by mouth 4 (four) times daily as needed for diarrhea or loose stools.   metFORMIN (GLUCOPHAGE-XR) 500 MG 24 hr tablet TAKE ONE TABLET EVERY DAY WITH  BREAKFAST   metoprolol succinate (TOPROL-XL) 50 MG 24 hr tablet TAKE 1 TABLET BY MOUTH DAILY WITH OR IMMEDIATELY FOLLOWING A MEAL   Multiple Vitamins-Minerals (PRESERVISION AREDS 2) CAPS Take 1 tablet by mouth 2 (two) times daily.   sertraline (ZOLOFT) 100 MG tablet Take 2 tablets (200 mg total) by mouth daily.   valsartan-hydrochlorothiazide (DIOVAN-HCT) 160-25 MG tablet TAKE 1 TABLET BY MOUTH NIGHTLY   ondansetron (ZOFRAN ODT) 4 MG disintegrating tablet Take 1 tablet (4 mg total) by mouth every 8 (eight) hours as needed. (Patient not taking: Reported on 10/29/2021)   Probiotic Product (PROBIOTIC PO) Take by mouth daily.  (Patient not taking: Reported on 10/29/2021)   triamcinolone ointment (KENALOG) 0.5 % APPLY TO AFFECTED AREAS TOPICALLY TWICE DAILY (Patient not taking: Reported on 10/29/2021)   No facility-administered medications prior to visit.    Review of Systems  Constitutional:  Negative for appetite change, chills, fatigue and fever.  Respiratory:  Negative for chest tightness and shortness of breath.   Cardiovascular:  Negative for chest pain and palpitations.  Gastrointestinal:  Negative for abdominal pain, nausea and vomiting.  Neurological:  Negative for dizziness and weakness.       Objective    There were no vitals taken for this visit.     Physical Exam   Awake, alert, oriented x 3. In no apparent distress   Assessment & Plan     1. COVID-19  - nirmatrelvir/ritonavir EUA, renal dosing, (PAXLOVID) 10 x 150 MG & 10 x 100MG TABS; Take 2 tablets by mouth  MyChart Video Visit    Virtual Visit via Video Note   This visit type was conducted due to national recommendations for restrictions regarding the COVID-19 Pandemic (e.g. social distancing) in an effort to limit this patient's exposure and mitigate transmission in our community. This patient is at least at moderate risk for complications without adequate follow up. This format is felt to be most appropriate for this patient at this time. Physical exam was limited by quality of the video and audio technology used for the visit.   Patient location: home Provider location: [facility]  I discussed the limitations of evaluation and management by telemedicine and the availability of in person appointments. The patient expressed understanding and agreed to proceed.  Patient: Christina Rubio   DOB: 09-22-1950   71 y.o. Female  MRN: [id number] Visit Date: 10/29/2021  Today's healthcare provider: Lelon Huh, MD   No chief complaint on file.  Subjective    HPI  Patient tested positive for Covid last night with a home test. Patient has symptoms of fever (99-101), cough, congestion and body aches. Patient has not taken any medications to treat symptoms. Sx started Friday and worsening over the weekend.    Medications: Outpatient Medications Prior to Visit  Medication Sig   ALPRAZolam (XANAX) 0.5 MG tablet TAKE 1 TABLET BY MOUTH EVERY DAY AS NEEDED FOR ANXIETY.   atorvastatin (LIPITOR) 20 MG tablet TAKE 1 TABLET BY MOUTH DAILY   blood glucose meter kit and supplies Dispense based on patient and insurance preference. Use once daily to check for fasting blood sugars. (FOR ICD-10 E10.9, E11.9).   levothyroxine (SYNTHROID) 112 MCG tablet Take 1 tablet (112 mcg total) by mouth daily.   loperamide (IMODIUM A-D) 2 MG tablet Take 1 tablet (2 mg total) by mouth 4 (four) times daily as needed for diarrhea or loose stools.   metFORMIN (GLUCOPHAGE-XR) 500 MG 24 hr tablet TAKE ONE TABLET EVERY DAY WITH  BREAKFAST   metoprolol succinate (TOPROL-XL) 50 MG 24 hr tablet TAKE 1 TABLET BY MOUTH DAILY WITH OR IMMEDIATELY FOLLOWING A MEAL   Multiple Vitamins-Minerals (PRESERVISION AREDS 2) CAPS Take 1 tablet by mouth 2 (two) times daily.   sertraline (ZOLOFT) 100 MG tablet Take 2 tablets (200 mg total) by mouth daily.   valsartan-hydrochlorothiazide (DIOVAN-HCT) 160-25 MG tablet TAKE 1 TABLET BY MOUTH NIGHTLY   ondansetron (ZOFRAN ODT) 4 MG disintegrating tablet Take 1 tablet (4 mg total) by mouth every 8 (eight) hours as needed. (Patient not taking: Reported on 10/29/2021)   Probiotic Product (PROBIOTIC PO) Take by mouth daily.  (Patient not taking: Reported on 10/29/2021)   triamcinolone ointment (KENALOG) 0.5 % APPLY TO AFFECTED AREAS TOPICALLY TWICE DAILY (Patient not taking: Reported on 10/29/2021)   No facility-administered medications prior to visit.    Review of Systems  Constitutional:  Negative for appetite change, chills, fatigue and fever.  Respiratory:  Negative for chest tightness and shortness of breath.   Cardiovascular:  Negative for chest pain and palpitations.  Gastrointestinal:  Negative for abdominal pain, nausea and vomiting.  Neurological:  Negative for dizziness and weakness.       Objective    There were no vitals taken for this visit.     Physical Exam   Awake, alert, oriented x 3. In no apparent distress   Assessment & Plan     1. COVID-19  - nirmatrelvir/ritonavir EUA, renal dosing, (PAXLOVID) 10 x 150 MG & 10 x 100MG TABS; Take 2 tablets by mouth

## 2021-10-29 NOTE — Telephone Encounter (Signed)
Copied from [place] [id number]. Topic: Referral - Status >> Oct 29, 2021 10:44 AM Cyndi Bender wrote: Reason for CRM: Pt would like to ask if a referral for a home health aid can be placed so she can have assistance with daily needs such as a shower.

## 2021-10-30 NOTE — Telephone Encounter (Addendum)
Patient called in on status of verbal of home health order. I see note saying ok to do. Please call  patient back on when this will start?

## 2021-10-30 NOTE — Telephone Encounter (Signed)
Spoke with patient to let her know her order is being sent to [facility].

## 2021-10-31 ENCOUNTER — Ambulatory Visit: Payer: Self-pay

## 2021-10-31 NOTE — Telephone Encounter (Signed)
  Chief Complaint: advice Symptoms: Covid positive as of 10/27/21, symptoms since 10/25/21 Frequency:  Pertinent Negatives: NA Disposition: '[]'$ ED /'[]'$ Urgent Care (no appt availability in office) / '[]'$ Appointment(In office/virtual)/ '[]'$  [place] [facility]/ '[x]'$ Home Care/ '[]'$ Refused Recommended Disposition /'[]'$ [place] [facility]/ '[]'$  Follow-up with PCP Additional Notes: pt states she started antiviral on Tuesday but was unsure how long to quarantine. Advised 5 days from first day of symptoms and then wearing face mask addition 10 days especially when in public. Pt verbalized understanding and didn't need further assistance.   Summary: Covid positive quarantine guideline questions   Pt has been taking paxlovid since Tuesday, symptoms began 10/25/2021. She has questions about quarantine guidelines.      Reason for Disposition  Health Information question, no triage required and triager able to answer question  Answer Assessment - Initial Assessment Questions 1. REASON FOR CALL or QUESTION: "What is your reason for calling today?" or "How can I best help you?" or "What question do you have that I can help answer?"     Wanting know how long to quarantine from COVID  Protocols used: Information Only Call - No Triage-A-AH

## 2021-11-05 ENCOUNTER — Telehealth: Payer: Self-pay | Admitting: Family Medicine

## 2021-11-05 DIAGNOSIS — R1314 Dysphagia, pharyngoesophageal phase: Secondary | ICD-10-CM | POA: Diagnosis not present

## 2021-11-05 DIAGNOSIS — E039 Hypothyroidism, unspecified: Secondary | ICD-10-CM | POA: Diagnosis not present

## 2021-11-05 DIAGNOSIS — U071 COVID-19: Secondary | ICD-10-CM | POA: Diagnosis not present

## 2021-11-05 DIAGNOSIS — I152 Hypertension secondary to endocrine disorders: Secondary | ICD-10-CM | POA: Diagnosis not present

## 2021-11-05 DIAGNOSIS — E034 Atrophy of thyroid (acquired): Secondary | ICD-10-CM | POA: Diagnosis not present

## 2021-11-05 DIAGNOSIS — F329 Major depressive disorder, single episode, unspecified: Secondary | ICD-10-CM | POA: Diagnosis not present

## 2021-11-05 DIAGNOSIS — M1990 Unspecified osteoarthritis, unspecified site: Secondary | ICD-10-CM | POA: Diagnosis not present

## 2021-11-05 DIAGNOSIS — Z6841 Body Mass Index (BMI) 40.0 and over, adult: Secondary | ICD-10-CM | POA: Diagnosis not present

## 2021-11-05 DIAGNOSIS — F411 Generalized anxiety disorder: Secondary | ICD-10-CM | POA: Diagnosis not present

## 2021-11-05 DIAGNOSIS — E1159 Type 2 diabetes mellitus with other circulatory complications: Secondary | ICD-10-CM | POA: Diagnosis not present

## 2021-11-05 DIAGNOSIS — E1151 Type 2 diabetes mellitus with diabetic peripheral angiopathy without gangrene: Secondary | ICD-10-CM | POA: Diagnosis not present

## 2021-11-05 DIAGNOSIS — E1169 Type 2 diabetes mellitus with other specified complication: Secondary | ICD-10-CM | POA: Diagnosis not present

## 2021-11-05 DIAGNOSIS — M4800 Spinal stenosis, site unspecified: Secondary | ICD-10-CM | POA: Diagnosis not present

## 2021-11-05 DIAGNOSIS — E785 Hyperlipidemia, unspecified: Secondary | ICD-10-CM | POA: Diagnosis not present

## 2021-11-05 DIAGNOSIS — I890 Lymphedema, not elsewhere classified: Secondary | ICD-10-CM | POA: Diagnosis not present

## 2021-11-05 NOTE — Progress Notes (Unsigned)
MyChart Video Visit    Virtual Visit via Video Note   This visit type was conducted due to national recommendations for restrictions regarding the COVID-19 Pandemic (e.g. social distancing) in an effort to limit this patient's exposure and mitigate transmission in our community. This patient is at least at moderate risk for complications without adequate follow up. This format is felt to be most appropriate for this patient at this time. Physical exam was limited by quality of the video and audio technology used for the visit.   Patient location: *** Provider location: ***  I discussed the limitations of evaluation and management by telemedicine and the availability of in person appointments. The patient expressed understanding and agreed to proceed.  Patient: Christina Rubio   DOB: 1950-07-18   71 y.o. Female  MRN: [id number] Visit Date: 11/07/2021  Today's healthcare provider: Gwyneth Sprout, FNP   No chief complaint on file.  Subjective    HPI  ***   Medications: Outpatient Medications Prior to Visit  Medication Sig   ALPRAZolam (XANAX) 0.5 MG tablet TAKE 1 TABLET BY MOUTH EVERY DAY AS NEEDED FOR ANXIETY.   atorvastatin (LIPITOR) 20 MG tablet TAKE 1 TABLET BY MOUTH DAILY   blood glucose meter kit and supplies Dispense based on patient and insurance preference. Use once daily to check for fasting blood sugars. (FOR ICD-10 E10.9, E11.9).   levothyroxine (SYNTHROID) 112 MCG tablet Take 1 tablet (112 mcg total) by mouth daily.   loperamide (IMODIUM A-D) 2 MG tablet Take 1 tablet (2 mg total) by mouth 4 (four) times daily as needed for diarrhea or loose stools.   metFORMIN (GLUCOPHAGE-XR) 500 MG 24 hr tablet TAKE ONE TABLET EVERY DAY WITH BREAKFAST   metoprolol succinate (TOPROL-XL) 50 MG 24 hr tablet TAKE 1 TABLET BY MOUTH DAILY WITH OR IMMEDIATELY FOLLOWING A MEAL   Multiple Vitamins-Minerals (PRESERVISION AREDS 2) CAPS Take 1 tablet by mouth 2 (two) times daily.   ondansetron  (ZOFRAN ODT) 4 MG disintegrating tablet Take 1 tablet (4 mg total) by mouth every 8 (eight) hours as needed. (Patient not taking: Reported on 10/29/2021)   Probiotic Product (PROBIOTIC PO) Take by mouth daily.  (Patient not taking: Reported on 10/29/2021)   sertraline (ZOLOFT) 100 MG tablet Take 2 tablets (200 mg total) by mouth daily.   triamcinolone ointment (KENALOG) 0.5 % APPLY TO AFFECTED AREAS TOPICALLY TWICE DAILY (Patient not taking: Reported on 10/29/2021)   valsartan-hydrochlorothiazide (DIOVAN-HCT) 160-25 MG tablet TAKE 1 TABLET BY MOUTH NIGHTLY   No facility-administered medications prior to visit.    Review of Systems  {Labs  Heme  Chem  Endocrine  Serology  Results Review (optional):23779}   Objective    There were no vitals taken for this visit.  {Show previous vital signs (optional):23777}   Physical Exam     Assessment & Plan     ***  No follow-ups on file.     I discussed the assessment and treatment plan with the patient. The patient was provided an opportunity to ask questions and all were answered. The patient agreed with the plan and demonstrated an understanding of the instructions.   The patient was advised to call back or seek an in-person evaluation if the symptoms worsen or if the condition fails to improve as anticipated.  I provided *** minutes of non-face-to-face time during this encounter.  {provider attestation***:1}  Gwyneth Sprout, [place] [phone] (phone) [phone] (fax)  [place]

## 2021-11-05 NOTE — Telephone Encounter (Signed)
Phu called back in to add to his VO request. He would also like to request verbal orders for a home health aid.   Callback Number: [phone]/ vm can be left

## 2021-11-05 NOTE — Telephone Encounter (Unsigned)
Home Health Verbal Orders - Caller/Agency: Phu/ [facility]  Callback Number: [phone]/ vm can be left  Requesting PT Frequency: 2x a week for 4 weeks and  1x a week for 4 weeks

## 2021-11-06 NOTE — Telephone Encounter (Signed)
Spoke with Daneil Dan, she agreed to verbal orders for OT. Advised Phu at [employer]

## 2021-11-07 ENCOUNTER — Telehealth (INDEPENDENT_AMBULATORY_CARE_PROVIDER_SITE_OTHER): Payer: Medicare Other | Admitting: Physician Assistant

## 2021-11-07 DIAGNOSIS — U071 COVID-19: Secondary | ICD-10-CM | POA: Diagnosis not present

## 2021-11-08 ENCOUNTER — Ambulatory Visit: Payer: Self-pay

## 2021-11-08 NOTE — Chronic Care Management (AMB) (Signed)
   11/08/2021  Christina Rubio 01-Apr-1950 [id number]   Documentation encounter created to complete case transition. The care management team will follow for care coordination as needed.  [place] [facility] [phone]

## 2021-11-12 DIAGNOSIS — H25012 Cortical age-related cataract, left eye: Secondary | ICD-10-CM | POA: Diagnosis not present

## 2021-11-12 DIAGNOSIS — H2512 Age-related nuclear cataract, left eye: Secondary | ICD-10-CM | POA: Diagnosis not present

## 2021-11-12 DIAGNOSIS — Z7984 Long term (current) use of oral hypoglycemic drugs: Secondary | ICD-10-CM | POA: Diagnosis not present

## 2021-11-12 DIAGNOSIS — E119 Type 2 diabetes mellitus without complications: Secondary | ICD-10-CM | POA: Diagnosis not present

## 2021-11-12 LAB — HM DIABETES EYE EXAM

## 2021-11-13 ENCOUNTER — Ambulatory Visit: Payer: Medicare Other | Admitting: Dermatology

## 2021-11-13 DIAGNOSIS — B351 Tinea unguium: Secondary | ICD-10-CM

## 2021-11-13 DIAGNOSIS — L280 Lichen simplex chronicus: Secondary | ICD-10-CM

## 2021-11-13 DIAGNOSIS — L720 Epidermal cyst: Secondary | ICD-10-CM | POA: Diagnosis not present

## 2021-11-13 DIAGNOSIS — I890 Lymphedema, not elsewhere classified: Secondary | ICD-10-CM | POA: Diagnosis not present

## 2021-11-13 DIAGNOSIS — B353 Tinea pedis: Secondary | ICD-10-CM | POA: Diagnosis not present

## 2021-11-13 DIAGNOSIS — L859 Epidermal thickening, unspecified: Secondary | ICD-10-CM

## 2021-11-13 DIAGNOSIS — Z7189 Other specified counseling: Secondary | ICD-10-CM

## 2021-11-13 MED ORDER — TERBINAFINE HCL 250 MG PO TABS: 250.0000 mg | ORAL_TABLET | Freq: Every day | ORAL | 0 refills | Status: DC

## 2021-11-13 NOTE — Patient Instructions (Addendum)
For lower legs and feet scaling Start Urea 40% cr 3 days a  week to lower legs and feet, can purchase on [employer]  For spot on Right posterior thigh start the Urea 40% cream daily and cover with bandage.  For Tinea Pedis/Unguium (fungus on feet and toenails) Start Terbinafine '250mg'$  1 pill a day.  After taking for 1 month call the office [phone] ext [phone]) and let the nurse know how you are doing, and if doing ok on the Lamisil we will send in another month of Lamisil.  Lymphedema legs Increase lymphedema pumps to 3 times weekly  Due to recent changes in healthcare laws, you may see results of your pathology and/or laboratory studies on MyChart before the doctors have had a chance to review them. We understand that in some cases there may be results that are confusing or concerning to you. Please understand that not all results are received at the same time and often the doctors may need to interpret multiple results in order to provide you with the best plan of care or course of treatment. Therefore, we ask that you please give [place] 2 business days to thoroughly review all your results before contacting the office for clarification. Should we see a critical lab result, you will be contacted sooner.   If You Need Anything After Your Visit  If you have any questions or concerns for your doctor, please call our main line at [phone] and press option 4 to reach your doctor's medical assistant. If no one answers, please leave a voicemail as directed and we will return your call as soon as possible. Messages left after 4 pm will be answered the following business day.   You may also send [place] a message via [place]. We typically respond to MyChart messages within 1-2 business days.  For prescription refills, please ask your pharmacy to contact our office. Our fax number is [phone].  If you have an urgent issue when the clinic is closed that cannot wait until the next business day, you can page  your doctor at the number below.    Please note that while we do our best to be available for urgent issues outside of office hours, we are not available 24/7.   If you have an urgent issue and are unable to reach [place], you may choose to seek medical care at your doctor's office, retail clinic, urgent care center, or emergency room.  If you have a medical emergency, please immediately call 911 or go to the emergency department.  Pager Numbers  - Dr. Nehemiah Massed: [phone]  - Dr. Laurence Ferrari: [phone]  - Dr. Nicole Kindred: [phone]  In the event of inclement weather, please call our main line at [phone] for an update on the status of any delays or closures.  Dermatology Medication Tips: Please keep the boxes that topical medications come in in order to help keep track of the instructions about where and how to use these. Pharmacies typically print the medication instructions only on the boxes and not directly on the medication tubes.   If your medication is too expensive, please contact our office at [phone] option 4 or send [place] a message through [place].   We are unable to tell what your co-pay for medications will be in advance as this is different depending on your insurance coverage. However, we may be able to find a substitute medication at lower cost or fill out paperwork to get insurance to cover a needed medication.   If  a prior authorization is required to get your medication covered by your insurance company, please allow [place] 1-2 business days to complete this process.  Drug prices often vary depending on where the prescription is filled and some pharmacies may offer cheaper prices.  The website www.goodrx.com contains coupons for medications through different pharmacies. The prices here do not account for what the cost may be with help from insurance (it may be cheaper with your insurance), but the website can give you the price if you did not use any insurance.  - You can  print the associated coupon and take it with your prescription to the pharmacy.  - You may also stop by our office during regular business hours and pick up a GoodRx coupon card.  - If you need your prescription sent electronically to a different pharmacy, notify our office through [facility] or by phone at [phone] option 4.     Si Usted Necesita Algo Despus de Su Visita  Tambin puede enviarnos un mensaje a travs de Pharmacist, community. Por lo general respondemos a los mensajes de MyChart en el transcurso de 1 a 2 das hbiles.  Para renovar recetas, por favor pida a su farmacia que se ponga en contacto con nuestra oficina. Harland Dingwall de fax es [place] [phone].  Si tiene un asunto urgente cuando la clnica est cerrada y que no puede esperar hasta el siguiente da hbil, puede llamar/localizar a su doctor(a) al nmero que aparece a continuacin.   Por favor, tenga en cuenta que aunque hacemos todo lo posible para estar disponibles para asuntos urgentes fuera del horario de [place], no estamos disponibles las 24 horas del da, los 7 das de la [place].   Si tiene un problema urgente y no puede comunicarse con nosotros, puede optar por buscar atencin mdica  en el consultorio de su doctor(a), en una clnica privada, en un centro de atencin urgente o en una sala de emergencias.  Si tiene Engineering geologist, por favor llame inmediatamente al 911 o vaya a la sala de emergencias.  Nmeros de bper  - Dr. Nehemiah Massed: [phone]  - Dra. Moye: [phone]  - Dra. Nicole Kindred: [phone]  En caso de inclemencias del [place], por favor llame a Johnsie Kindred principal al [phone] para una actualizacin sobre el [place] de cualquier retraso o cierre.  Consejos para la medicacin en dermatologa: Por favor, guarde las cajas en las que vienen los medicamentos de uso tpico para ayudarle a seguir las instrucciones sobre dnde y cmo usarlos. Las farmacias generalmente imprimen las  instrucciones del medicamento slo en las cajas y no directamente en los tubos del [place].   Si su medicamento es muy caro, por favor, pngase en contacto con Zigmund Daniel llamando al [phone] y presione la opcin 4 o envenos un mensaje a travs de Pharmacist, community.   No podemos decirle cul ser su copago por los medicamentos por adelantado ya que esto es diferente dependiendo de la cobertura de su seguro. Sin embargo, es posible que podamos encontrar un medicamento sustituto a Electrical engineer un formulario para que el seguro cubra el medicamento que se considera necesario.   Si se requiere una autorizacin previa para que su compaa de seguros [place] su medicamento, por favor permtanos de 1 a 2 das hbiles para completar este proceso.  Los precios de los medicamentos varan con frecuencia dependiendo del Environmental consultant de dnde se surte la receta y alguna farmacias pueden ofrecer precios ms baratos.  El sitio web www.goodrx.com tiene  diferentes farmacias. Los precios aqu no tienen en cuenta lo que podra costar con la ayuda del seguro (puede ser ms barato con su seguro), pero el sitio web puede darle el precio si no utiliz ningn seguro.  - Puede imprimir el cupn correspondiente y llevarlo con su receta a la farmacia.  - Tambin puede pasar por nuestra oficina durante el horario de atencin regular y recoger una tarjeta de cupones de GoodRx.  - Si necesita que su receta se enve electrnicamente a una farmacia diferente, informe a nuestra oficina a travs de MyChart de [place] o por telfono llamando al [phone] y presione la opcin 4.  

## 2021-11-13 NOTE — Progress Notes (Signed)
New Patient Visit  Subjective  Christina Rubio is a 71 y.o. female who presents for the following: non healing wound (R post thigh, ~ 1 yr, neosporin in past, witch hazel), check spot (R back, 2 yrs this episode, pt thinks may be a boil, hx if I&D in past), and scaling (Bil lower legs, pt hx of Lymphedema in past).  New patient referral from Dr. Lavon Paganini.  The following portions of the chart were reviewed this encounter and updated as appropriate:   Tobacco  Allergies  Meds  Problems  Med Hx  Surg Hx  Fam Hx     Review of Systems:  No other skin or systemic complaints except as noted in HPI or Assessment and Plan.  Objective  Well appearing patient in no apparent distress; mood and affect are within normal limits.  A focused examination was performed including back, legs. Relevant physical exam findings are noted in the Assessment and Plan.  Upper back at base of neck 1.5cm cystic pap  bil lower legs and feet Hyperkeratosis lower legs and feet       R post lower thigh 2.0cm keratotic plaque R post lower leg  bil feet, toenails Toenail dystrophy, scaling and hyperkeratosis bil feet        Assessment & Plan  Lymphedema bil lower legs Recommend increasing Lymphedema pumps to 3 times weekly. Discussed with patient home health nurse helping her get in and out of her pumps.  Epidermal cyst Upper back at base of neck Benign-appearing. Exam most consistent with an epidermal inclusion cyst. Discussed that a cyst is a benign growth that can grow over time and sometimes get irritated or inflamed. Recommend observation if it is not bothersome. Discussed option of surgical excision to remove it if it is growing, symptomatic, or other changes noted. Please call for new or changing lesions so they can be evaluated.  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 The pill version is often used to treat fungal infections of the nails or scalp. While most people do not have any side effects from taking terbinafine pills, some possible side effects of the medicine can include taste changes, headache, loss of smell, vision changes, nausea, vomiting, or diarrhea.   Rare side effects can include irritation of the liver, allergic reaction, or decrease in blood counts (which may show up as not feeling well or developing an infection). If you are concerned about any of these side effects, please stop the medicine and call your doctor, or in the case of an emergency such as feeling very unwell, seek immediate medical care.    terbinafine (LAMISIL) 250 MG tablet - bil feet, toenails Take 1 tablet (250 mg total) by mouth daily.  Return in about 3 months (around 02/13/2022) for Tinea Pedis/Unguium f/u, Hyperkeratosis f/u, .  I, Sonya Hupman, RMA, am  acting as scribe for Sarina Ser, MD . Documentation: I have reviewed the above documentation for accuracy and completeness, and I agree with the above.  Sarina Ser, MD

## 2021-11-15 ENCOUNTER — Encounter: Payer: Self-pay | Admitting: Dermatology

## 2021-11-18 DIAGNOSIS — I152 Hypertension secondary to endocrine disorders: Secondary | ICD-10-CM

## 2021-11-18 DIAGNOSIS — M4800 Spinal stenosis, site unspecified: Secondary | ICD-10-CM

## 2021-11-18 DIAGNOSIS — E1151 Type 2 diabetes mellitus with diabetic peripheral angiopathy without gangrene: Secondary | ICD-10-CM

## 2021-11-18 DIAGNOSIS — F329 Major depressive disorder, single episode, unspecified: Secondary | ICD-10-CM

## 2021-11-18 DIAGNOSIS — E034 Atrophy of thyroid (acquired): Secondary | ICD-10-CM

## 2021-11-18 DIAGNOSIS — F411 Generalized anxiety disorder: Secondary | ICD-10-CM

## 2021-11-18 DIAGNOSIS — E1159 Type 2 diabetes mellitus with other circulatory complications: Secondary | ICD-10-CM

## 2021-11-18 DIAGNOSIS — I890 Lymphedema, not elsewhere classified: Secondary | ICD-10-CM

## 2021-11-18 DIAGNOSIS — R1314 Dysphagia, pharyngoesophageal phase: Secondary | ICD-10-CM

## 2021-11-18 DIAGNOSIS — U071 COVID-19: Secondary | ICD-10-CM

## 2021-11-18 DIAGNOSIS — E039 Hypothyroidism, unspecified: Secondary | ICD-10-CM

## 2021-11-18 DIAGNOSIS — M1990 Unspecified osteoarthritis, unspecified site: Secondary | ICD-10-CM

## 2021-11-19 ENCOUNTER — Telehealth: Payer: Self-pay

## 2021-11-19 NOTE — Telephone Encounter (Signed)
Advised 

## 2021-11-19 NOTE — Telephone Encounter (Signed)
Copied from [place] [id number]. Topic: General - Other >> Nov 19, 2021  8:22 AM Tiffany B wrote: Requesting verbal orders for OT 1x 7, home health aid to assist with bathing 1x 6.

## 2021-12-05 DIAGNOSIS — I890 Lymphedema, not elsewhere classified: Secondary | ICD-10-CM | POA: Diagnosis not present

## 2021-12-05 DIAGNOSIS — M1990 Unspecified osteoarthritis, unspecified site: Secondary | ICD-10-CM | POA: Diagnosis not present

## 2021-12-05 DIAGNOSIS — U071 COVID-19: Secondary | ICD-10-CM | POA: Diagnosis not present

## 2021-12-05 DIAGNOSIS — Z6841 Body Mass Index (BMI) 40.0 and over, adult: Secondary | ICD-10-CM | POA: Diagnosis not present

## 2021-12-05 DIAGNOSIS — E1169 Type 2 diabetes mellitus with other specified complication: Secondary | ICD-10-CM | POA: Diagnosis not present

## 2021-12-05 DIAGNOSIS — E039 Hypothyroidism, unspecified: Secondary | ICD-10-CM | POA: Diagnosis not present

## 2021-12-05 DIAGNOSIS — E1151 Type 2 diabetes mellitus with diabetic peripheral angiopathy without gangrene: Secondary | ICD-10-CM | POA: Diagnosis not present

## 2021-12-05 DIAGNOSIS — M4800 Spinal stenosis, site unspecified: Secondary | ICD-10-CM | POA: Diagnosis not present

## 2021-12-05 DIAGNOSIS — E785 Hyperlipidemia, unspecified: Secondary | ICD-10-CM | POA: Diagnosis not present

## 2021-12-05 DIAGNOSIS — I152 Hypertension secondary to endocrine disorders: Secondary | ICD-10-CM | POA: Diagnosis not present

## 2021-12-05 DIAGNOSIS — F329 Major depressive disorder, single episode, unspecified: Secondary | ICD-10-CM | POA: Diagnosis not present

## 2021-12-05 DIAGNOSIS — R1314 Dysphagia, pharyngoesophageal phase: Secondary | ICD-10-CM | POA: Diagnosis not present

## 2021-12-05 DIAGNOSIS — F411 Generalized anxiety disorder: Secondary | ICD-10-CM | POA: Diagnosis not present

## 2021-12-05 DIAGNOSIS — E1159 Type 2 diabetes mellitus with other circulatory complications: Secondary | ICD-10-CM | POA: Diagnosis not present

## 2021-12-05 DIAGNOSIS — E034 Atrophy of thyroid (acquired): Secondary | ICD-10-CM | POA: Diagnosis not present

## 2021-12-12 ENCOUNTER — Telehealth: Payer: Self-pay | Admitting: Family Medicine

## 2021-12-12 NOTE — Telephone Encounter (Signed)
Home Health Verbal Orders - Caller/Agency: [facility]  Callback Number:  [phone]  Requesting OT/PT/Skilled Nursing/Social Work/Speech Therapy: Social Work

## 2021-12-12 NOTE — Telephone Encounter (Signed)
[place] for [employer].   Eulis Foster, MD  [facility]  [phone]

## 2021-12-25 ENCOUNTER — Other Ambulatory Visit: Payer: Self-pay | Admitting: Dermatology

## 2021-12-25 DIAGNOSIS — B353 Tinea pedis: Secondary | ICD-10-CM

## 2022-01-01 ENCOUNTER — Other Ambulatory Visit: Payer: Self-pay | Admitting: Dermatology

## 2022-01-01 DIAGNOSIS — B353 Tinea pedis: Secondary | ICD-10-CM

## 2022-01-04 DIAGNOSIS — I872 Venous insufficiency (chronic) (peripheral): Secondary | ICD-10-CM | POA: Diagnosis not present

## 2022-01-04 DIAGNOSIS — F411 Generalized anxiety disorder: Secondary | ICD-10-CM | POA: Diagnosis not present

## 2022-01-04 DIAGNOSIS — E034 Atrophy of thyroid (acquired): Secondary | ICD-10-CM | POA: Diagnosis not present

## 2022-01-04 DIAGNOSIS — I152 Hypertension secondary to endocrine disorders: Secondary | ICD-10-CM | POA: Diagnosis not present

## 2022-01-04 DIAGNOSIS — M4800 Spinal stenosis, site unspecified: Secondary | ICD-10-CM | POA: Diagnosis not present

## 2022-01-04 DIAGNOSIS — Z6841 Body Mass Index (BMI) 40.0 and over, adult: Secondary | ICD-10-CM | POA: Diagnosis not present

## 2022-01-04 DIAGNOSIS — Z7984 Long term (current) use of oral hypoglycemic drugs: Secondary | ICD-10-CM | POA: Diagnosis not present

## 2022-01-04 DIAGNOSIS — E1169 Type 2 diabetes mellitus with other specified complication: Secondary | ICD-10-CM | POA: Diagnosis not present

## 2022-01-04 DIAGNOSIS — E785 Hyperlipidemia, unspecified: Secondary | ICD-10-CM | POA: Diagnosis not present

## 2022-01-04 DIAGNOSIS — R1314 Dysphagia, pharyngoesophageal phase: Secondary | ICD-10-CM | POA: Diagnosis not present

## 2022-01-04 DIAGNOSIS — F329 Major depressive disorder, single episode, unspecified: Secondary | ICD-10-CM | POA: Diagnosis not present

## 2022-01-04 DIAGNOSIS — E1151 Type 2 diabetes mellitus with diabetic peripheral angiopathy without gangrene: Secondary | ICD-10-CM | POA: Diagnosis not present

## 2022-01-04 DIAGNOSIS — E1159 Type 2 diabetes mellitus with other circulatory complications: Secondary | ICD-10-CM | POA: Diagnosis not present

## 2022-01-04 DIAGNOSIS — I890 Lymphedema, not elsewhere classified: Secondary | ICD-10-CM | POA: Diagnosis not present

## 2022-01-04 DIAGNOSIS — M1990 Unspecified osteoarthritis, unspecified site: Secondary | ICD-10-CM | POA: Diagnosis not present

## 2022-01-07 ENCOUNTER — Telehealth: Payer: Self-pay

## 2022-01-07 ENCOUNTER — Telehealth: Payer: Self-pay | Admitting: Family Medicine

## 2022-01-07 NOTE — Telephone Encounter (Signed)
Connie advised. 

## 2022-01-07 NOTE — Telephone Encounter (Signed)
Copied from [place] [phone]. Topic: General - Other >> Jan 07, 2022  8:28 AM Ludger Nutting wrote: Home Health Verbal Orders - Caller/Agency: [place] Number:  [phone] Requesting OT/PT/Skilled Nursing/Social Work/Speech Therapy: OT and Home Health Aid Frequency: once a week for 8 weeks Home Health aid, 1 x every other week 8 weeks OT

## 2022-01-07 NOTE — Telephone Encounter (Signed)
Home Health Verbal Orders - Caller/Agency: [facility]  Callback Number: [phone] vm can be left  Requesting PT Frequency: 1x a week for 9 weeks

## 2022-01-07 NOTE — Telephone Encounter (Signed)
OK for verbals 

## 2022-01-09 NOTE — Telephone Encounter (Signed)
OK for verbals 

## 2022-01-09 NOTE — Telephone Encounter (Signed)
Left voicemail as per note.

## 2022-01-20 ENCOUNTER — Encounter (INDEPENDENT_AMBULATORY_CARE_PROVIDER_SITE_OTHER): Payer: Self-pay

## 2022-02-03 ENCOUNTER — Other Ambulatory Visit: Payer: Self-pay | Admitting: Family Medicine

## 2022-02-03 DIAGNOSIS — M4800 Spinal stenosis, site unspecified: Secondary | ICD-10-CM | POA: Diagnosis not present

## 2022-02-03 DIAGNOSIS — Z6841 Body Mass Index (BMI) 40.0 and over, adult: Secondary | ICD-10-CM | POA: Diagnosis not present

## 2022-02-03 DIAGNOSIS — M1990 Unspecified osteoarthritis, unspecified site: Secondary | ICD-10-CM | POA: Diagnosis not present

## 2022-02-03 DIAGNOSIS — F329 Major depressive disorder, single episode, unspecified: Secondary | ICD-10-CM | POA: Diagnosis not present

## 2022-02-03 DIAGNOSIS — E1159 Type 2 diabetes mellitus with other circulatory complications: Secondary | ICD-10-CM | POA: Diagnosis not present

## 2022-02-03 DIAGNOSIS — F411 Generalized anxiety disorder: Secondary | ICD-10-CM | POA: Diagnosis not present

## 2022-02-03 DIAGNOSIS — I872 Venous insufficiency (chronic) (peripheral): Secondary | ICD-10-CM | POA: Diagnosis not present

## 2022-02-03 DIAGNOSIS — E785 Hyperlipidemia, unspecified: Secondary | ICD-10-CM | POA: Diagnosis not present

## 2022-02-03 DIAGNOSIS — E034 Atrophy of thyroid (acquired): Secondary | ICD-10-CM

## 2022-02-03 DIAGNOSIS — E1151 Type 2 diabetes mellitus with diabetic peripheral angiopathy without gangrene: Secondary | ICD-10-CM | POA: Diagnosis not present

## 2022-02-03 DIAGNOSIS — I890 Lymphedema, not elsewhere classified: Secondary | ICD-10-CM | POA: Diagnosis not present

## 2022-02-03 DIAGNOSIS — Z7984 Long term (current) use of oral hypoglycemic drugs: Secondary | ICD-10-CM | POA: Diagnosis not present

## 2022-02-03 DIAGNOSIS — E1169 Type 2 diabetes mellitus with other specified complication: Secondary | ICD-10-CM | POA: Diagnosis not present

## 2022-02-03 DIAGNOSIS — R1314 Dysphagia, pharyngoesophageal phase: Secondary | ICD-10-CM | POA: Diagnosis not present

## 2022-02-03 DIAGNOSIS — I152 Hypertension secondary to endocrine disorders: Secondary | ICD-10-CM | POA: Diagnosis not present

## 2022-02-19 ENCOUNTER — Ambulatory Visit: Payer: Medicare Other | Admitting: Dermatology

## 2022-03-05 ENCOUNTER — Telehealth: Payer: Self-pay | Admitting: Family Medicine

## 2022-03-05 NOTE — Telephone Encounter (Signed)
Pt needs some equipment on her rollator replaced, she has had it for years and she needs help getting it replaced with Medicare. Please advise   Best contact: [phone]

## 2022-03-06 NOTE — Telephone Encounter (Signed)
Medicare requires a face to face visit (can be virtual or in person) for new DME orders.  Please set up appointment with the patient so we can get this ordered and document medical necessity.

## 2022-03-11 ENCOUNTER — Ambulatory Visit (INDEPENDENT_AMBULATORY_CARE_PROVIDER_SITE_OTHER): Payer: Medicare Other

## 2022-03-11 VITALS — Wt 371.0 lb

## 2022-03-11 DIAGNOSIS — Z0000 Encounter for general adult medical examination without abnormal findings: Secondary | ICD-10-CM | POA: Diagnosis not present

## 2022-03-11 NOTE — Progress Notes (Signed)
Virtual Visit via Telephone Note  I connected with  Christina Rubio on 03/11/22 at  2:15 PM EST by telephone and verified that I am speaking with the correct person using two identifiers.  Location: Patient: home Provider: [facility] Persons participating in the virtual visit: [place]   I discussed the limitations, risks, security and privacy concerns of performing an evaluation and management service by telephone and the availability of in person appointments. The patient expressed understanding and agreed to proceed.  Interactive audio and video telecommunications were attempted between this nurse and patient, however failed, due to patient having technical difficulties OR patient did not have access to video capability.  We continued and completed visit with audio only.  Some vital signs may be absent or patient reported.   Dionisio David, LPN  Subjective:   Christina Rubio is a 71 y.o. female who presents for Medicare Annual (Subsequent) preventive examination.  Review of Systems     Cardiac Risk Factors include: advanced age (>49mn, >>20women);obesity (BMI >30kg/m2);sedentary lifestyle;hypertension     Objective:    Today's Vitals   03/11/22 1415  PainSc: 4    There is no height or weight on file to calculate BMI.     03/11/2022    2:28 PM 03/04/2021   11:12 AM 07/06/2019    1:49 PM 09/11/2018    8:43 PM 05/11/2018    1:47 PM 03/14/2016   11:30 PM 03/07/2016   10:22 AM  Advanced Directives  Does Patient Have a Medical Advance Directive? _0  No No  Would patient like information on creating a medical advance directive? No - Patient declined No - Patient declined No - Patient declined  Yes (MAU/Ambulatory/Procedural Areas - Information given)      Current Medications (verified) Outpatient Encounter Medications as of 03/11/2022  Medication Sig   ALPRAZolam (XANAX) 0.5 MG tablet TAKE 1 TABLET BY MOUTH EVERY DAY AS NEEDED FOR ANXIETY.    atorvastatin (LIPITOR) 20 MG tablet TAKE 1 TABLET BY MOUTH DAILY   blood glucose meter kit and supplies Dispense based on patient and insurance preference. Use once daily to check for fasting blood sugars. (FOR ICD-10 E10.9, E11.9).   levothyroxine (SYNTHROID) 112 MCG tablet TAKE 1 TABLET EVERY DAY ON EMPTY STOMACHWITH A GLASS OF WATER AT LEAST 30-60 MINBEFORE BREAKFAST   loperamide (IMODIUM A-D) 2 MG tablet Take 1 tablet (2 mg total) by mouth 4 (four) times daily as needed for diarrhea or loose stools.   metoprolol succinate (TOPROL-XL) 50 MG 24 hr tablet TAKE 1 TABLET BY MOUTH DAILY WITH OR IMMEDIATELY FOLLOWING A MEAL   Multiple Vitamins-Minerals (PRESERVISION AREDS 2) CAPS Take 1 tablet by mouth 2 (two) times daily.   ondansetron (ZOFRAN ODT) 4 MG disintegrating tablet Take 1 tablet (4 mg total) by mouth every 8 (eight) hours as needed.   Probiotic Product (PROBIOTIC PO) Take by mouth daily.   sertraline (ZOLOFT) 100 MG tablet Take 2 tablets (200 mg total) by mouth daily.   triamcinolone ointment (KENALOG) 0.5 % APPLY TO AFFECTED AREAS TOPICALLY TWICE DAILY   valsartan-hydrochlorothiazide (DIOVAN-HCT) 160-25 MG tablet TAKE 1 TABLET BY MOUTH NIGHTLY   metFORMIN (GLUCOPHAGE-XR) 500 MG 24 hr tablet TAKE ONE TABLET EVERY DAY WITH BREAKFAST (Patient not taking: Reported on 03/11/2022)   terbinafine (LAMISIL) 250 MG tablet Take 1 tablet (250 mg total) by mouth daily. (Patient not taking: Reported on 03/11/2022)   No facility-administered encounter medications on file as of 03/11/2022.  Allergies (verified) Shellfish allergy, Epinephrine, Penicillins, and Strawberry extract   History: Past Medical History:  Diagnosis Date   Anxiety    Arthritis    Depression    Hyperlipidemia    Hypertension    Neuromuscular disorder ([place])    Osteoporosis    PONV (postoperative nausea and vomiting)    Skin cancer    non melanoma   Spinal stenosis    Thyroid disease    hyperthyroid s/p partial  thyroidectomy   Past Surgical History:  Procedure Laterality Date   CESAREAN SECTION     CHOLECYSTECTOMY     COLON SURGERY     FEMUR IM NAIL Right 01/09/2016   Procedure: INTRAMEDULLARY (IM) RETROGRADE FEMORAL NAILING RIGHT DISTAL FEMUR;  Surgeon: Dorna Leitz, MD;  Location: [place];  Service: Orthopedics;  Laterality: Right;   LAPAROSCOPIC GASTRIC SLEEVE RESECTION     Right thyroidectomy     TONSILLECTOMY     Family History  Problem Relation Age of Onset   Lung cancer Mother    Prostate cancer Father    Depression Daughter    Anemia Daughter        PNH   Thyroid cancer Maternal Uncle    Lung cancer Paternal Uncle    Stroke Maternal Grandmother    Stroke Maternal Grandfather    Stroke Paternal Grandmother    Healthy Sister    Healthy Brother    Healthy Sister    Emphysema Paternal Grandfather    Colon cancer Neg Hx    Breast cancer Neg Hx    Ovarian cancer Neg Hx    Cervical cancer Neg Hx    Social History   Socioeconomic History   Marital status: Widowed    Spouse name: Not on file   Number of children: 1   Years of education: 2 master's degrees   Highest education level: Master's degree (e.g., MA, MS, MEng, MEd, MSW, MBA)  Occupational History   Occupation: retired    Comment: Proofreader  Tobacco Use   Smoking status: Never   Smokeless tobacco: Never  Vaping Use   Vaping Use: Never used  Substance and Sexual Activity   Alcohol use: Yes    Comment: 1-2 drinks per year   Drug use: No   Sexual activity: Never  Other Topics Concern   Not on file  Social History Narrative   Not on file   Social Determinants of Health   Financial Resource Strain: Low Risk  (03/11/2022)   Overall Financial Resource Strain (CARDIA)    Difficulty of Paying Living Expenses: Not very hard  Food Insecurity: No Food Insecurity (03/11/2022)   Hunger Vital Sign    Worried About Running Out of Food in the Last Year: Never true    Ran Out of Food in the Last Year: Never true   Transportation Needs: No Transportation Needs (03/11/2022)   PRAPARE - Hydrologist (Medical): No    Lack of Transportation (Non-Medical): No  Physical Activity: Inactive (03/11/2022)   Exercise Vital Sign    Days of Exercise per Week: 0 days    Minutes of Exercise per Session: 0 min  Stress: No Stress Concern Present (03/11/2022)   [place]    Feeling of Stress : Only a little  Social Connections: Socially Isolated (03/11/2022)   Social Connection and Isolation Panel [NHANES]    Frequency of Communication with Friends and Family: More than three times a week  Frequency of Social Gatherings with Friends and Family: More than three times a week    Attends Religious Services: Never    Marine scientist or Organizations: No    Attends Archivist Meetings: Never    Marital Status: Widowed    Tobacco Counseling Counseling given: Not Answered   Clinical Intake:  Pre-visit preparation completed: Yes  Pain : 0-10 Pain Score: 4  Pain Type: Chronic pain Pain Location: Knee Pain Orientation: Left     Diabetes: Yes CBG done?: No Did pt. bring in CBG monitor from home?: No  How often do you need to have someone help you when you read instructions, pamphlets, or other written materials from your doctor or pharmacy?: 1 - Never  Diabetic?yes Nutrition Risk Assessment:  Has the patient had any N/V/D within the last 2 months?  Yes  Does the patient have any non-healing wounds?  No  Has the patient had any unintentional weight loss or weight gain?  No   Diabetes:  Is the patient diabetic?  Yes  If diabetic, was a CBG obtained today?  No  Did the patient bring in their glucometer from home?  No  How often do you monitor your CBG's? 4x/week.   Financial Strains and Diabetes Management:  Are you having any financial strains with the device, your supplies or your  medication? No .  Does the patient want to be seen by Chronic Care Management for management of their diabetes?  No  Would the patient like to be referred to a Nutritionist or for Diabetic Management?  No   Diabetic Exams:  Diabetic Eye Exam: Completed 11/12/21. Marland Kitchen Pt has been advised about the importance in completing this exam.  Diabetic Foot Exam: Completed 08/08/21. Pt has been advised about the importance in completing this exam.    Interpreter Needed?: No  Information entered by :: Kirke Shaggy, LPN   Activities of Daily Living    03/11/2022    2:32 PM 08/08/2021    9:47 AM  In your present state of health, do you have any difficulty performing the following activities:  Hearing? 0 0  Vision? 0 1  Difficulty concentrating or making decisions? 0 1  Walking or climbing stairs? 1 1  Dressing or bathing? 0 1  Doing errands, shopping? 1 1  Preparing Food and eating ? N   Using the Toilet? N   In the past six months, have you accidently leaked urine? N   Do you have problems with loss of bowel control? N   Managing your Medications? N   Managing your Finances? N   Housekeeping or managing your Housekeeping? Y     Patient Care Team: [name], MD as PCP - General (Family Medicine) Dorna Leitz, MD as Consulting Physician (Orthopedic Surgery) End, Harrell Gave, MD as Consulting Physician (Cardiology)  Indicate any recent Medical Services you may have received from other than [facility] providers in the past year (date may be approximate).     Assessment:   This is a routine wellness examination for [facility].  Hearing/Vision screen Hearing Screening - Comments:: No aids Vision Screening - Comments:: Glasses- Dr.Nice  Dietary issues and exercise activities discussed: Current Exercise Habits: The patient does not participate in regular exercise at present   Goals Addressed             This Visit's Progress    DIET - EAT MORE FRUITS AND VEGETABLES          Depression  Screen    03/11/2022    2:24 PM 08/08/2021    9:46 AM 03/04/2021   11:05 AM 12/04/2020    9:18 AM 12/04/2020    9:13 AM 05/22/2020    4:28 PM 04/24/2020    1:31 PM  PHQ 2/9 Scores  PHQ - 2 Score _0 PHQ- 9 Score _1 Fall Risk    03/11/2022    2:31 PM 08/08/2021    9:46 AM 03/04/2021   11:15 AM 07/20/2020    5:11 PM 05/22/2020    4:27 PM  Fall Risk   Falls in the past year? 0 0 0  0  Number falls in past yr: 1 0 0  0  Injury with Fall? 0 0 0  0  Risk for fall due to : No Fall Risks;History of fall(s) No Fall Risks History of fall(s) History of fall(s);Medication side effect;Impaired balance/gait;Impaired mobility   Follow up Falls prevention discussed;Falls evaluation completed Falls evaluation completed Falls prevention discussed Falls prevention discussed     FALL RISK PREVENTION PERTAINING TO THE HOME:  Any stairs in or around the home? No  If so, are there any without handrails? No  Home free of loose throw rugs in walkways, pet beds, electrical cords, etc? Yes  Adequate lighting in your home to reduce risk of falls? Yes   ASSISTIVE DEVICES UTILIZED TO PREVENT FALLS:  Life alert? No  Use of a cane, walker or w/c? Yes  Grab bars in the bathroom? Yes  Shower chair or bench in shower? Yes  Elevated toilet seat or a handicapped toilet? Yes    Cognitive Function:        03/11/2022    2:36 PM 07/06/2019    2:00 PM 05/11/2018    1:57 PM  6CIT Screen  What Year? 0 points 0 points 0 points  What month? 0 points 0 points 0 points  What time? 0 points 0 points 0 points  Count back from 20 0 points 0 points 0 points  Months in reverse 0 points 0 points 0 points  Repeat phrase 0 points 0 points 0 points  Total Score 0 points 0 points 0 points    Immunizations Immunization History  Administered Date(s) Administered   Fluad Quad(high Dose 65+) 01/06/2019, 12/04/2020   Influenza, High Dose Seasonal PF 01/29/2017, 04/20/2018    Influenza,inj,Quad PF,6+ Mos 01/26/2020   PFIZER(Purple Top)SARS-COV-2 Vaccination 05/20/2019, 06/14/2019   Pneumococcal Conjugate-13 05/28/2017   Pneumococcal Polysaccharide-23 07/15/2019   Td 05/24/2018   Zoster Recombinat (Shingrix) 11/17/2017    TDAP status: Up to date  Flu Vaccine status: Due, Education has been provided regarding the importance of this vaccine. Advised may receive this vaccine at local pharmacy or Health Dept. Aware to provide a copy of the vaccination record if obtained from local pharmacy or Health Dept. Verbalized acceptance and understanding.  Pneumococcal vaccine status: Up to date  Covid-19 vaccine status: Completed vaccines  Qualifies for Shingles Vaccine? Yes   Zostavax completed No   Shingrix Completed?: No.    Education has been provided regarding the importance of this vaccine. Patient has been advised to call insurance company to determine out of pocket expense if they have not yet received this vaccine. Advised may also receive vaccine at local pharmacy or Health Dept. Verbalized acceptance and understanding.  Screening Tests Health Maintenance  Topic Date Due   COLONOSCOPY (Pts 45-94yr Insurance coverage will need  to be confirmed)  Never done   MAMMOGRAM  Never done   DEXA SCAN  Never done   Zoster Vaccines- Shingrix (2 of 2) 01/12/2018   INFLUENZA VACCINE  10/22/2021   COVID-19 Vaccine (3 - 2023-24 season) 11/22/2021   HEMOGLOBIN A1C  02/08/2022   Diabetic kidney evaluation - eGFR measurement  08/09/2022   Diabetic kidney evaluation - Urine ACR  08/09/2022   FOOT EXAM  08/09/2022   OPHTHALMOLOGY EXAM  11/13/2022   Medicare Annual Wellness (AWV)  03/12/2023   DTaP/Tdap/Td (2 - Tdap) 05/23/2028   Pneumonia Vaccine 57+ Years old  Completed   Hepatitis C Screening  Completed   HPV VACCINES  Aged Out    Health Maintenance  Health Maintenance Due  Topic Date Due   COLONOSCOPY (Pts 45-82yr Insurance coverage will need to be confirmed)   Never done   MAMMOGRAM  Never done   DEXA SCAN  Never done   Zoster Vaccines- Shingrix (2 of 2) 01/12/2018   INFLUENZA VACCINE  10/22/2021   COVID-19 Vaccine (3 - 2023-24 season) 11/22/2021   HEMOGLOBIN A1C  02/08/2022    Declined colonoscopy referral  Mammogram status: Ordered 08/08/21. Pt provided with contact info and advised to call to schedule appt.   Bone Density status: Ordered 08/08/21. Pt provided with contact info and advised to call to schedule appt.  Lung Cancer Screening: (Low Dose CT Chest recommended if Age 70-80years, 30 pack-year currently smoking OR have quit w/in 15years.) does not qualify.   Additional Screening:  Hepatitis C Screening: does qualify; Completed 05/24/18  Vision Screening: Recommended annual ophthalmology exams for early detection of glaucoma and other disorders of the eye. Is the patient up to date with their annual eye exam?  Yes  Who is the provider or what is the name of the office in which the patient attends annual eye exams? [facility] pt is not established with a provider, would they like to be referred to a provider to establish care? No .   Dental Screening: Recommended annual dental exams for proper oral hygiene  Community Resource Referral / Chronic Care Management: CRR required this visit?  No   CCM required this visit?  No      Plan:     I have personally reviewed and noted the following in the patient's chart:   Medical and social history Use of alcohol, tobacco or illicit drugs  Current medications and supplements including opioid prescriptions. Patient is not currently taking opioid prescriptions. Functional ability and status Nutritional status Physical activity Advanced directives List of other physicians Hospitalizations, surgeries, and ER visits in previous 12 months Vitals Screenings to include cognitive, depression, and falls Referrals and appointments  In addition, I have reviewed and discussed with  patient certain preventive protocols, quality metrics, and best practice recommendations. A written personalized care plan for preventive services as well as general preventive health recommendations were provided to patient.     LDionisio David LPN   [id number]/08/2374  Nurse Notes: none

## 2022-03-11 NOTE — Patient Instructions (Signed)
[name] , Thank you for taking time to come for your Medicare Wellness Visit. I appreciate your ongoing commitment to your health goals. Please review the following plan we discussed and let me know if I can assist you in the future.   Screening recommendations/referrals: Colonoscopy: declined referral Mammogram: ordered 08/08/21 Bone Density: ordered 08/08/21 Recommended yearly ophthalmology/optometry visit for glaucoma screening and checkup Recommended yearly dental visit for hygiene and checkup  Vaccinations: Influenza vaccine: 12/04/20 Pneumococcal vaccine: 07/15/19 Tdap vaccine: 05/24/18 Shingles vaccine: n/d   Covid-19:05/20/19, 06/14/19  Advanced directives: no  Conditions/risks identified: none  Next appointment: Follow up in one year for your annual wellness visit 03/16/23 @ 9:30 by phone   Preventive Care 65 Years and Older, Female Preventive care refers to lifestyle choices and visits with your health care provider that can promote health and wellness. What does preventive care include? A yearly physical exam. This is also called an annual well check. Dental exams once or twice a year. Routine eye exams. Ask your health care provider how often you should have your eyes checked. Personal lifestyle choices, including: Daily care of your teeth and gums. Regular physical activity. Eating a healthy diet. Avoiding tobacco and drug use. Limiting alcohol use. Practicing safe sex. Taking low-dose aspirin every day. Taking vitamin and mineral supplements as recommended by your health care provider. What happens during an annual well check? The services and screenings done by your health care provider during your annual well check will depend on your age, overall health, lifestyle risk factors, and family history of disease. Counseling  Your health care provider may ask you questions about your: Alcohol use. Tobacco use. Drug use. Emotional well-being. Home and  relationship well-being. Sexual activity. Eating habits. History of falls. Memory and ability to understand (cognition). Work and work Statistician. Reproductive health. Screening  You may have the following tests or measurements: Height, weight, and BMI. Blood pressure. Lipid and cholesterol levels. These may be checked every 5 years, or more frequently if you are over 69 years old. Skin check. Lung cancer screening. You may have this screening every year starting at age 41 if you have a 30-pack-year history of smoking and currently smoke or have quit within the past 15 years. Fecal occult blood test (FOBT) of the stool. You may have this test every year starting at age 46. Flexible sigmoidoscopy or colonoscopy. You may have a sigmoidoscopy every 5 years or a colonoscopy every 10 years starting at age 28. Hepatitis C blood test. Hepatitis B blood test. Sexually transmitted disease (STD) testing. Diabetes screening. This is done by checking your blood sugar (glucose) after you have not eaten for a while (fasting). You may have this done every 1-3 years. Bone density scan. This is done to screen for osteoporosis. You may have this done starting at age 43. Mammogram. This may be done every 1-2 years. Talk to your health care provider about how often you should have regular mammograms. Talk with your health care provider about your test results, treatment options, and if necessary, the need for more tests. Vaccines  Your health care provider may recommend certain vaccines, such as: Influenza vaccine. This is recommended every year. Tetanus, diphtheria, and acellular pertussis (Tdap, Td) vaccine. You may need a Td booster every 10 years. Zoster vaccine. You may need this after age 39. Pneumococcal 13-valent conjugate (PCV13) vaccine. One dose is recommended after age 51. Pneumococcal polysaccharide (PPSV23) vaccine. One dose is recommended after age 70. Talk to your health  care provider  about which screenings and vaccines you need and how often you need them. This information is not intended to replace advice given to you by your health care provider. Make sure you discuss any questions you have with your health care provider. Document Released: 04/06/2015 Document Revised: 11/28/2015 Document Reviewed: 01/09/2015 Elsevier Interactive Patient Education  2017 [place] Prevention in the Home Falls can cause injuries. They can happen to people of all ages. There are many things you can do to make your home safe and to help prevent falls. What can I do on the outside of my home? Regularly fix the edges of walkways and driveways and fix any cracks. Remove anything that might make you trip as you walk through a door, such as a raised step or threshold. Trim any bushes or trees on the path to your home. Use bright outdoor lighting. Clear any walking paths of anything that might make someone trip, such as rocks or tools. Regularly check to see if handrails are loose or broken. Make sure that both sides of any steps have handrails. Any raised decks and porches should have guardrails on the edges. Have any leaves, snow, or ice cleared regularly. Use sand or salt on walking paths during winter. Clean up any spills in your garage right away. This includes oil or grease spills. What can I do in the bathroom? Use night lights. Install grab bars by the toilet and in the tub and shower. Do not use towel bars as grab bars. Use non-skid mats or decals in the tub or shower. If you need to sit down in the shower, use a plastic, non-slip stool. Keep the floor dry. Clean up any water that spills on the floor as soon as it happens. Remove soap buildup in the tub or shower regularly. Attach bath mats securely with double-sided non-slip rug tape. Do not have throw rugs and other things on the floor that can make you trip. What can I do in the bedroom? Use night lights. Make sure  that you have a light by your bed that is easy to reach. Do not use any sheets or blankets that are too big for your bed. They should not hang down onto the floor. Have a firm chair that has side arms. You can use this for support while you get dressed. Do not have throw rugs and other things on the floor that can make you trip. What can I do in the kitchen? Clean up any spills right away. Avoid walking on wet floors. Keep items that you use a lot in easy-to-reach places. If you need to reach something above you, use a strong step stool that has a grab bar. Keep electrical cords out of the way. Do not use floor polish or wax that makes floors slippery. If you must use wax, use non-skid floor wax. Do not have throw rugs and other things on the floor that can make you trip. What can I do with my stairs? Do not leave any items on the stairs. Make sure that there are handrails on both sides of the stairs and use them. Fix handrails that are broken or loose. Make sure that handrails are as long as the stairways. Check any carpeting to make sure that it is firmly attached to the stairs. Fix any carpet that is loose or worn. Avoid having throw rugs at the top or bottom of the stairs. If you do have throw rugs, attach them to  the floor with carpet tape. Make sure that you have a light switch at the top of the stairs and the bottom of the stairs. If you do not have them, ask someone to add them for you. What else can I do to help prevent falls? Wear shoes that: Do not have high heels. Have rubber bottoms. Are comfortable and fit you well. Are closed at the toe. Do not wear sandals. If you use a stepladder: Make sure that it is fully opened. Do not climb a closed stepladder. Make sure that both sides of the stepladder are locked into place. Ask someone to hold it for you, if possible. Clearly mark and make sure that you can see: Any grab bars or handrails. First and last steps. Where the edge of  each step is. Use tools that help you move around (mobility aids) if they are needed. These include: Canes. Walkers. Scooters. Crutches. Turn on the lights when you go into a dark area. Replace any light bulbs as soon as they burn out. Set up your furniture so you have a clear path. Avoid moving your furniture around. If any of your floors are uneven, fix them. If there are any pets around you, be aware of where they are. Review your medicines with your doctor. Some medicines can make you feel dizzy. This can increase your chance of falling. Ask your doctor what other things that you can do to help prevent falls. This information is not intended to replace advice given to you by your health care provider. Make sure you discuss any questions you have with your health care provider. Document Released: 01/04/2009 Document Revised: 08/16/2015 Document Reviewed: 04/14/2014 Elsevier Interactive Patient Education  2017 [employer].

## 2022-03-13 DIAGNOSIS — M170 Bilateral primary osteoarthritis of knee: Secondary | ICD-10-CM | POA: Diagnosis not present

## 2022-03-13 DIAGNOSIS — M1712 Unilateral primary osteoarthritis, left knee: Secondary | ICD-10-CM | POA: Diagnosis not present

## 2022-03-13 DIAGNOSIS — M1711 Unilateral primary osteoarthritis, right knee: Secondary | ICD-10-CM | POA: Diagnosis not present

## 2022-03-13 DIAGNOSIS — G5601 Carpal tunnel syndrome, right upper limb: Secondary | ICD-10-CM | POA: Diagnosis not present

## 2022-03-14 ENCOUNTER — Encounter: Payer: Self-pay | Admitting: Family Medicine

## 2022-03-14 ENCOUNTER — Telehealth (INDEPENDENT_AMBULATORY_CARE_PROVIDER_SITE_OTHER): Payer: Medicare Other | Admitting: Family Medicine

## 2022-03-14 ENCOUNTER — Telehealth: Payer: Self-pay | Admitting: Family Medicine

## 2022-03-14 DIAGNOSIS — G8929 Other chronic pain: Secondary | ICD-10-CM

## 2022-03-14 DIAGNOSIS — M4807 Spinal stenosis, lumbosacral region: Secondary | ICD-10-CM

## 2022-03-14 DIAGNOSIS — F411 Generalized anxiety disorder: Secondary | ICD-10-CM | POA: Diagnosis not present

## 2022-03-14 DIAGNOSIS — M25562 Pain in left knee: Secondary | ICD-10-CM

## 2022-03-14 DIAGNOSIS — M159 Polyosteoarthritis, unspecified: Secondary | ICD-10-CM

## 2022-03-14 DIAGNOSIS — M25561 Pain in right knee: Secondary | ICD-10-CM

## 2022-03-14 DIAGNOSIS — Z9989 Dependence on other enabling machines and devices: Secondary | ICD-10-CM

## 2022-03-14 DIAGNOSIS — R262 Difficulty in walking, not elsewhere classified: Secondary | ICD-10-CM

## 2022-03-14 MED ORDER — ALPRAZOLAM 0.5 MG PO TABS: ORAL_TABLET | ORAL | 0 refills | Status: DC

## 2022-03-14 NOTE — Telephone Encounter (Signed)
Pt states she is currently in physical therapy for both knee  Pt states she is unable to bare weight on her knees and requesting a referral for home health care  Pt requesting a cb to discuss request further

## 2022-03-14 NOTE — Assessment & Plan Note (Signed)
Looking for a new psychiatrist Needs xanax refill in the interim Continue zoloft

## 2022-03-14 NOTE — Assessment & Plan Note (Signed)
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 F/b ortho HH PT and OT DME walker upright

## 2022-03-14 NOTE — Telephone Encounter (Signed)
Virtual visit done today to discuss.

## 2022-03-14 NOTE — Progress Notes (Signed)
I,Sulibeya S Dimas,acting as a Education administrator for Lavon Paganini, MD.,have documented all relevant documentation on the behalf of Lavon Paganini, MD,as directed by  Lavon Paganini, MD while in the presence of Lavon Paganini, MD.   MyChart Video Visit    Virtual Visit via Video Note   This format is felt to be most appropriate for this patient at this time. Physical exam was limited by quality of the video and audio technology used for the visit.    Patient location: home Provider location: [place] involved in the visit: patient, provider   I discussed the limitations of evaluation and management by telemedicine and the availability of in person appointments. The patient expressed understanding and agreed to proceed.  Patient: Christina Rubio   DOB: 1950-04-13   71 y.o. Female  MRN: [id number] Visit Date: 03/14/2022  Today's healthcare provider: Lavon Paganini, MD   Chief Complaint  Patient presents with   DME need   Subjective    HPI  Patient requesting a rollator walker due to knee pain. Patient requesting PT, OT and home health.  Patient reports she went to [facility] and got injections in her knees and in right hand due to carpal tunnel.    Anxiety, Follow-up  She was last seen for anxiety 6 months ago. Changes made at last visit include no changes.   She reports excellent compliance with treatment. She reports excellent tolerance of treatment. Patient reports she has not seen psychiatrist in a few months. She reports she did not "click" with psychiatrist. She is requesting refill on xanax. She reports using as needed. Has used 3 times this week due to being scared of falls.  She is not having side effects.   She feels her anxiety is severe and Worse since last visit.  GAD-7 Results    03/14/2022   10:57 AM 02/21/2021   11:01 AM 12/04/2020    9:12 AM  GAD-7 Generalized Anxiety Disorder Screening Tool  1. Feeling Nervous,  Anxious, or on Edge _0 2. Not Being Able to Stop or Control Worrying 0 1 3  3. Worrying Too Much About Different Things 0 1 3  4. Trouble Relaxing 0 0 3  5. Being So Restless it's Hard To Sit Still 0 0 0  6. Becoming Easily Annoyed or Irritable 0 1 1  7. Feeling Afraid As If Something Awful Might Happen _1 Total GAD-7 Score _2 Difficulty At Work, Home, or Getting  Along With Others? Very difficult Somewhat difficult     PHQ-9 Scores    03/11/2022    2:24 PM 08/08/2021    9:46 AM 03/04/2021   11:05 AM  PHQ9 SCORE ONLY  PHQ-9 Total Score _3 ---------------------------------------------------------------------------------------------------   Medications: Outpatient Medications Prior to Visit  Medication Sig   atorvastatin (LIPITOR) 20 MG tablet TAKE 1 TABLET BY MOUTH DAILY   blood glucose meter kit and supplies Dispense based on patient and insurance preference. Use once daily to check for fasting blood sugars. (FOR ICD-10 E10.9, E11.9).   levothyroxine (SYNTHROID) 112 MCG tablet TAKE 1 TABLET EVERY DAY ON EMPTY STOMACHWITH A GLASS OF WATER AT LEAST 30-60 MINBEFORE BREAKFAST   loperamide (IMODIUM A-D) 2 MG tablet Take 1 tablet (2 mg total) by mouth 4 (four) times daily as needed for diarrhea or loose stools.   metFORMIN (GLUCOPHAGE-XR) 500 MG 24 hr tablet TAKE ONE TABLET EVERY DAY WITH BREAKFAST  metoprolol succinate (TOPROL-XL) 50 MG 24 hr tablet TAKE 1 TABLET BY MOUTH DAILY WITH OR IMMEDIATELY FOLLOWING A MEAL   Multiple Vitamins-Minerals (PRESERVISION AREDS 2) CAPS Take 1 tablet by mouth 2 (two) times daily.   ondansetron (ZOFRAN ODT) 4 MG disintegrating tablet Take 1 tablet (4 mg total) by mouth every 8 (eight) hours as needed.   Probiotic Product (PROBIOTIC PO) Take by mouth daily.   sertraline (ZOLOFT) 100 MG tablet Take 2 tablets (200 mg total) by mouth daily.   terbinafine (LAMISIL) 250 MG tablet Take 1 tablet (250 mg total) by mouth daily.    triamcinolone ointment (KENALOG) 0.5 % APPLY TO AFFECTED AREAS TOPICALLY TWICE DAILY   valsartan-hydrochlorothiazide (DIOVAN-HCT) 160-25 MG tablet TAKE 1 TABLET BY MOUTH NIGHTLY   [DISCONTINUED] ALPRAZolam (XANAX) 0.5 MG tablet TAKE 1 TABLET BY MOUTH EVERY DAY AS NEEDED FOR ANXIETY.   No facility-administered medications prior to visit.    Review of Systems  Constitutional:  Negative for appetite change.  Respiratory:  Negative for choking, chest tightness and shortness of breath.   Cardiovascular:  Positive for leg swelling. Negative for chest pain.  Gastrointestinal:  Negative for abdominal pain, nausea and vomiting.  Musculoskeletal:  Positive for arthralgias and gait problem.  Psychiatric/Behavioral:  The patient is nervous/anxious.        Objective    There were no vitals taken for this visit.     Physical Exam Constitutional:      General: She is not in acute distress.    Appearance: Normal appearance.  HENT:     Head: Normocephalic.  Pulmonary:     Effort: Pulmonary effort is normal. No respiratory distress.  Neurological:     Mental Status: She is alert and oriented to person, place, and time. Mental status is at baseline.     Comments: Unable to ambulate      Assessment & Plan     Problem List Items Addressed This Visit       Musculoskeletal and Integument   DJD (degenerative joint disease) - Primary    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 F/b ortho HH PT and OT DME walker upright      Relevant Orders   Ambulatory referral to [facility] home use only DME 4 wheeled rolling walker with seat ([id number])     Other   Spinal stenosis   Relevant Orders   Ambulatory referral to [facility] home use only DME 4 wheeled rolling walker with seat ([id number])   GAD (generalized anxiety disorder)    Looking for a new psychiatrist Needs xanax refill in the interim Continue zoloft      Relevant Medications   ALPRAZolam (XANAX)  0.5 MG tablet   Other Visit Diagnoses     Dependent on walker for ambulation       Relevant Orders   Ambulatory referral to [place] home use only DME 4 wheeled rolling walker with seat ([id number])   Impaired ambulation       Relevant Orders   Ambulatory referral to [facility] home use only DME 4 wheeled rolling walker with seat ([id number])   Chronic pain of both knees       Relevant Orders   Ambulatory referral to [facility] home use only DME 4 wheeled rolling walker with seat ([id number])        Return if symptoms worsen or fail to improve.  I discussed the assessment and treatment plan with the patient. The patient was provided an opportunity to ask questions and all were answered. The patient agreed with the plan and demonstrated an understanding of the instructions.   The patient was advised to call back or seek an in-person evaluation if the symptoms worsen or if the condition fails to improve as anticipated.  I, Lavon Paganini, MD, have reviewed all documentation for this visit. The documentation on 03/14/22 for the exam, diagnosis, procedures, and orders are all accurate and complete.   Hilary Pundt, Dionne Bucy, MD, MPH [facility]

## 2022-03-21 ENCOUNTER — Telehealth: Payer: Self-pay

## 2022-03-21 NOTE — Telephone Encounter (Signed)
Copied from [place] [id number]. Topic: General - Inquiry >> Mar 21, 2022 11:13 AM Erskine Squibb wrote: Reason for CRM: Dewana with [facility] called in about a delay of care with the patient. The patient requested for them to come out on January 1st to begin her Physical Therapy.

## 2022-03-25 ENCOUNTER — Telehealth: Payer: Self-pay | Admitting: Family Medicine

## 2022-03-25 DIAGNOSIS — Z5982 Transportation insecurity: Secondary | ICD-10-CM | POA: Diagnosis not present

## 2022-03-25 DIAGNOSIS — G8929 Other chronic pain: Secondary | ICD-10-CM | POA: Diagnosis not present

## 2022-03-25 DIAGNOSIS — F411 Generalized anxiety disorder: Secondary | ICD-10-CM | POA: Diagnosis not present

## 2022-03-25 DIAGNOSIS — M479 Spondylosis, unspecified: Secondary | ICD-10-CM | POA: Diagnosis not present

## 2022-03-25 DIAGNOSIS — Z993 Dependence on wheelchair: Secondary | ICD-10-CM | POA: Diagnosis not present

## 2022-03-25 DIAGNOSIS — Z9181 History of falling: Secondary | ICD-10-CM | POA: Diagnosis not present

## 2022-03-25 DIAGNOSIS — M4807 Spinal stenosis, lumbosacral region: Secondary | ICD-10-CM | POA: Diagnosis not present

## 2022-03-25 DIAGNOSIS — M170 Bilateral primary osteoarthritis of knee: Secondary | ICD-10-CM | POA: Diagnosis not present

## 2022-03-25 NOTE — Telephone Encounter (Signed)
Ok for verbal orders.    Christina Zaldivar Simmons-Robinson, MD  [facility]  

## 2022-03-25 NOTE — Telephone Encounter (Signed)
Verbals orders given to [facility] with [facility]

## 2022-03-25 NOTE — Telephone Encounter (Signed)
Soni calling from [facility] is calling to request verbal orders for PT. Frequency- 1 Week 7  CB- [phone]

## 2022-03-25 NOTE — Telephone Encounter (Signed)
Noted  

## 2022-03-26 DIAGNOSIS — M479 Spondylosis, unspecified: Secondary | ICD-10-CM | POA: Diagnosis not present

## 2022-03-26 DIAGNOSIS — Z5982 Transportation insecurity: Secondary | ICD-10-CM | POA: Diagnosis not present

## 2022-03-26 DIAGNOSIS — M170 Bilateral primary osteoarthritis of knee: Secondary | ICD-10-CM | POA: Diagnosis not present

## 2022-03-26 DIAGNOSIS — Z993 Dependence on wheelchair: Secondary | ICD-10-CM | POA: Diagnosis not present

## 2022-03-26 DIAGNOSIS — M4807 Spinal stenosis, lumbosacral region: Secondary | ICD-10-CM | POA: Diagnosis not present

## 2022-03-26 DIAGNOSIS — F411 Generalized anxiety disorder: Secondary | ICD-10-CM | POA: Diagnosis not present

## 2022-03-26 DIAGNOSIS — Z9181 History of falling: Secondary | ICD-10-CM | POA: Diagnosis not present

## 2022-03-26 DIAGNOSIS — G8929 Other chronic pain: Secondary | ICD-10-CM | POA: Diagnosis not present

## 2022-03-27 ENCOUNTER — Telehealth: Payer: Self-pay | Admitting: Family Medicine

## 2022-03-27 NOTE — Telephone Encounter (Signed)
Home Health Verbal Orders - Caller/Agency: stephanie/ [facility] Callback Number: [phone] Requesting OT Frequency: 1 wk 6

## 2022-03-27 NOTE — Telephone Encounter (Signed)
OK for verbals 

## 2022-03-28 NOTE — Telephone Encounter (Signed)
[facility] notified

## 2022-04-04 DIAGNOSIS — F411 Generalized anxiety disorder: Secondary | ICD-10-CM | POA: Diagnosis not present

## 2022-04-04 DIAGNOSIS — M4807 Spinal stenosis, lumbosacral region: Secondary | ICD-10-CM | POA: Diagnosis not present

## 2022-04-04 DIAGNOSIS — G8929 Other chronic pain: Secondary | ICD-10-CM | POA: Diagnosis not present

## 2022-04-04 DIAGNOSIS — Z993 Dependence on wheelchair: Secondary | ICD-10-CM | POA: Diagnosis not present

## 2022-04-04 DIAGNOSIS — Z9181 History of falling: Secondary | ICD-10-CM | POA: Diagnosis not present

## 2022-04-04 DIAGNOSIS — M170 Bilateral primary osteoarthritis of knee: Secondary | ICD-10-CM | POA: Diagnosis not present

## 2022-04-04 DIAGNOSIS — M479 Spondylosis, unspecified: Secondary | ICD-10-CM | POA: Diagnosis not present

## 2022-04-04 DIAGNOSIS — Z5982 Transportation insecurity: Secondary | ICD-10-CM | POA: Diagnosis not present

## 2022-04-08 ENCOUNTER — Telehealth: Payer: Self-pay | Admitting: Family Medicine

## 2022-04-08 DIAGNOSIS — M170 Bilateral primary osteoarthritis of knee: Secondary | ICD-10-CM | POA: Diagnosis not present

## 2022-04-08 DIAGNOSIS — Z9181 History of falling: Secondary | ICD-10-CM | POA: Diagnosis not present

## 2022-04-08 DIAGNOSIS — Z993 Dependence on wheelchair: Secondary | ICD-10-CM | POA: Diagnosis not present

## 2022-04-08 DIAGNOSIS — M479 Spondylosis, unspecified: Secondary | ICD-10-CM | POA: Diagnosis not present

## 2022-04-08 DIAGNOSIS — G8929 Other chronic pain: Secondary | ICD-10-CM | POA: Diagnosis not present

## 2022-04-08 DIAGNOSIS — M4807 Spinal stenosis, lumbosacral region: Secondary | ICD-10-CM | POA: Diagnosis not present

## 2022-04-08 DIAGNOSIS — Z5982 Transportation insecurity: Secondary | ICD-10-CM | POA: Diagnosis not present

## 2022-04-08 DIAGNOSIS — F411 Generalized anxiety disorder: Secondary | ICD-10-CM | POA: Diagnosis not present

## 2022-04-08 NOTE — Telephone Encounter (Signed)
Horris Latino with [facility] called saying this patient has dogs and she has not been able to have home PT because she can't secure the dogs.  She is not sure why.  She said it is their policy to have the dogs secured.  She has had two appts she didn't make.  CB# [phone]

## 2022-04-10 NOTE — Telephone Encounter (Signed)
Noted  

## 2022-04-11 ENCOUNTER — Telehealth: Payer: Self-pay | Admitting: Family Medicine

## 2022-04-11 NOTE — Telephone Encounter (Signed)
Please review.  KP

## 2022-04-11 NOTE — Telephone Encounter (Signed)
Copied from [place] [id number]. Topic: General - Other >> Apr 11, 2022  1:30 PM Ludger Nutting wrote: Home Health Verbal Orders - Caller/Agency: [facility] Number: [phone] Requesting OT/PT/Skilled Nursing/Social Work/Speech Therapy: Social Work Evaluation

## 2022-04-14 NOTE — Telephone Encounter (Signed)
OK for verbals 

## 2022-04-15 NOTE — Telephone Encounter (Signed)
Left verbal orders on VM. 

## 2022-04-16 DIAGNOSIS — M479 Spondylosis, unspecified: Secondary | ICD-10-CM | POA: Diagnosis not present

## 2022-04-16 DIAGNOSIS — M4807 Spinal stenosis, lumbosacral region: Secondary | ICD-10-CM | POA: Diagnosis not present

## 2022-04-16 DIAGNOSIS — Z5982 Transportation insecurity: Secondary | ICD-10-CM | POA: Diagnosis not present

## 2022-04-16 DIAGNOSIS — M170 Bilateral primary osteoarthritis of knee: Secondary | ICD-10-CM | POA: Diagnosis not present

## 2022-04-16 DIAGNOSIS — Z993 Dependence on wheelchair: Secondary | ICD-10-CM | POA: Diagnosis not present

## 2022-04-16 DIAGNOSIS — F411 Generalized anxiety disorder: Secondary | ICD-10-CM | POA: Diagnosis not present

## 2022-04-16 DIAGNOSIS — Z9181 History of falling: Secondary | ICD-10-CM | POA: Diagnosis not present

## 2022-04-16 DIAGNOSIS — G8929 Other chronic pain: Secondary | ICD-10-CM | POA: Diagnosis not present

## 2022-04-17 ENCOUNTER — Other Ambulatory Visit: Payer: Self-pay | Admitting: Family Medicine

## 2022-04-21 ENCOUNTER — Telehealth: Payer: Self-pay | Admitting: Family Medicine

## 2022-04-21 NOTE — Telephone Encounter (Signed)
Copied from [place] [id number]. Topic: General - Other >> Apr 21, 2022  3:22 PM Cyndi Bender wrote: Reason for CRM: Soni with [facility] reports that the patient requested to be discharged from all services. Cb# [phone]

## 2022-04-22 ENCOUNTER — Telehealth: Payer: Self-pay | Admitting: *Deleted

## 2022-04-22 NOTE — Telephone Encounter (Signed)
Requesting pill pack for medication- to facilitate ease in taking medication daily- Pharmacy is requesting current medication list- fax- [phone]

## 2022-04-22 NOTE — Telephone Encounter (Signed)
Noted  

## 2022-04-23 ENCOUNTER — Telehealth: Payer: Self-pay | Admitting: Family Medicine

## 2022-04-23 DIAGNOSIS — E1169 Type 2 diabetes mellitus with other specified complication: Secondary | ICD-10-CM

## 2022-04-23 NOTE — Telephone Encounter (Signed)
Medication list faxed to [facility] [phone]

## 2022-04-24 ENCOUNTER — Ambulatory Visit: Payer: Self-pay | Admitting: *Deleted

## 2022-04-24 ENCOUNTER — Telehealth: Payer: Self-pay

## 2022-04-24 DIAGNOSIS — M159 Polyosteoarthritis, unspecified: Secondary | ICD-10-CM

## 2022-04-24 DIAGNOSIS — R262 Difficulty in walking, not elsewhere classified: Secondary | ICD-10-CM

## 2022-04-24 DIAGNOSIS — G8929 Other chronic pain: Secondary | ICD-10-CM

## 2022-04-24 DIAGNOSIS — Z9989 Dependence on other enabling machines and devices: Secondary | ICD-10-CM

## 2022-04-24 DIAGNOSIS — M4807 Spinal stenosis, lumbosacral region: Secondary | ICD-10-CM

## 2022-04-24 NOTE — Addendum Note (Signed)
Addended by: Barnie Mort on: 04/24/2022 02:48 PM   Modules accepted: Orders

## 2022-04-24 NOTE — Telephone Encounter (Signed)
Copied from [place] [id number]. Topic: General - Other >> Apr 24, 2022  9:47 AM Sabas Sous wrote: Reason for CRM: Pt is requesting to have home health therapy orders with [facility], the current agency is "just not working out" because of her pets being a conflict with them.

## 2022-04-24 NOTE — Telephone Encounter (Signed)
Please put in new referral for [facility] PT and OT and request her preferred agency. Know she fired the other agency

## 2022-04-24 NOTE — Telephone Encounter (Signed)
  Chief Complaint: Report a fall and request which home care agency she wants to use for home PT and OT Symptoms: She didn't get all the way up on the bed and slid off the side.   EMS came and assisted her up without problems using a lift.   She only sustained a bruise from the metal side rail on her bed. Frequency: Yesterday once Pertinent Negatives: Patient denies injuries other than bruise. Disposition: '[]'$ ED /'[]'$ Urgent Care (no appt availability in office) / '[]'$ Appointment(In office/virtual)/ '[]'$  [facility]/ '[x]'$ Home Care/ '[]'$ Refused Recommended Disposition /'[]'$ [facility]/ '[]'$  Follow-up with PCP Additional Notes: She does not want to use [facility].    She prefers to use [facility] for her PT . I have sent this information to [facility].

## 2022-04-24 NOTE — Telephone Encounter (Signed)
See other phone note about this same thing

## 2022-04-24 NOTE — Telephone Encounter (Signed)
Reason for Disposition  [1] Recent fall AND [2] no injury  Answer Assessment - Initial Assessment Questions 1. MECHANISM: "How did the fall happen?"     My knee has started bothering me again.   It was shaky yesterday.   I got into bed and I did not get all the way in and I sled off the bed.    I have a metal side rail on my bed.     I didn't actually fall but I slid down beside my bed.   I called EMS and they used a lift to help me stand up.  They were wonderful.   I was fine.    This morning I've been fine.   My knee is giving way.   I have an appt. To get fitted for knee brace.   2. DOMESTIC VIOLENCE AND ELDER ABUSE SCREENING: "Did you fall because someone pushed you or tried to hurt you?" If Yes, ask: "Are you safe now?"     N/A 3. ONSET: "When did the fall happen?" (e.g., minutes, hours, or days ago)     Yesterday 4. LOCATION: "What part of the body hit the ground?" (e.g., back, buttocks, head, hips, knees, hands, head, stomach)     No   just a bruise 5. INJURY: "Did you hurt (injure) yourself when you fell?" If Yes, ask: "What did you injure? Tell me more about this?" (e.g., body area; type of injury; pain severity)"     No injuries 6. PAIN: "Is there any pain?" If Yes, ask: "How bad is the pain?" (e.g., Scale 1-10; or mild,  moderate, severe)   - NONE (0): No pain   - MILD (1-3): Doesn't interfere with normal activities    - MODERATE (4-7): Interferes with normal activities or awakens from sleep    - SEVERE (8-10): Excruciating pain, unable to do any normal activities      No 7. SIZE: For cuts, bruises, or swelling, ask: "How large is it?" (e.g., inches or centimeters)      Bruise 8. PREGNANCY: "Is there any chance you are pregnant?" "When was your last menstrual period?"     N/A 9. OTHER SYMPTOMS: "Do you have any other symptoms?" (e.g., dizziness, fever, weakness; new onset or worsening).      My knee gives way 10. CAUSE: "What do you think caused the fall (or falling)?" (e.g.,  tripped, dizzy spell)       I didn't make it all the way up on the bed and slid off.  Protocols used: Falls and [facility]

## 2022-04-24 NOTE — Telephone Encounter (Signed)
Christina Crews, MD to [facility] Nurse      04/24/22  1:19 PM Note Please put in new referral for [facility] PT and OT and request her preferred agency. Know she fired the other agency

## 2022-04-24 NOTE — Telephone Encounter (Signed)
Pt called and needs a Rx for her testing strips for her blood glucose monitoring system. Please advise, [facility].

## 2022-04-25 ENCOUNTER — Ambulatory Visit: Payer: Self-pay | Admitting: *Deleted

## 2022-04-25 MED ORDER — BLOOD GLUCOSE TEST STRIPS 333 VI STRP: ORAL_STRIP | 6 refills | Status: DC

## 2022-04-25 NOTE — Telephone Encounter (Signed)
  Chief Complaint: can not get blood sample to check blood sugar this am   Symptoms: sticking fingers and palm of hand to get sample and blood dries / not enough blood to place in test strip. Reports trying approx 10 times.  Frequency: today Pertinent Negatives: Patient denies signs / sx of high or low blood sugar Disposition: '[]'$ ED /'[]'$ Urgent Care (no appt availability in office) / '[]'$ Appointment(In office/virtual)/ '[]'$  [facility]/ '[x]'$ Home Care/ '[]'$ Refused Recommended Disposition /'[]'$ [facility]/ '[]'$  Follow-up with PCP Additional Notes:   Recommended patient wash hands in warm water or wrapping hands in warm cloth just prior to checking blood sugar.  If unsuccessful call back to office . May need to come in to office for assistance to check blood sugar if needed. Please advise.    Reason for Disposition  [1] Caller requesting NON-URGENT health information AND [2] PCP's office is the best resource  Answer Assessment - Initial Assessment Questions 1. REASON FOR CALL or QUESTION: "What is your reason for calling today?" or "How can I best help you?" or "What question do you have that I can help answer?"     Difficulty getting a sample of  blood  to check blood sugar.  Protocols used: Information Only Call - No Triage-A-AH

## 2022-04-25 NOTE — Addendum Note (Signed)
Addended by: Shawna Orleans on: 04/25/2022 09:19 AM   Modules accepted: Orders

## 2022-04-28 ENCOUNTER — Telehealth: Payer: Self-pay | Admitting: Family Medicine

## 2022-04-28 NOTE — Telephone Encounter (Signed)
Pt is calling to request if referral for OT & PT can be changed to [facility]. Please advise CB- [place]  Does not want to come to patients home because she has dogs.

## 2022-04-28 NOTE — Telephone Encounter (Signed)
This was already changed. [facility] should reach out to her.

## 2022-04-28 NOTE — Telephone Encounter (Signed)
Left detailed message advising as below. 

## 2022-05-16 NOTE — Telephone Encounter (Signed)
Pt called to report that she has yet to hear from the home health services for both OT and PT.

## 2022-05-19 ENCOUNTER — Ambulatory Visit: Payer: Self-pay

## 2022-05-19 NOTE — Telephone Encounter (Signed)
Summary: Urinary vs vaginal symptoms   Pt called saying she either has BV or a UTI.  She has a discharge that smells and burns when she urinates also.  She does not have a ride to the office.  Please advise  CB#  [phone]         Call went to vm. Unable to leave message voice mail is full.

## 2022-05-19 NOTE — Telephone Encounter (Addendum)
  Chief Complaint: vaginal discharge Symptoms: vaginal discharge and odor, burning with urination Frequency: since Friday or Saturday Pertinent Negatives: Patient denies itching Disposition: []$ ED /[]$ Urgent Care (no appt availability in office) / [x]$ Appointment(In office/virtual)/ []$  [facility]/ []$ Home Care/ []$ Refused Recommended Disposition /[]$ [facility]/ []$  Follow-up with PCP Additional Notes: pt states she hasn't tried anything OTC because most of the time she only has this issue after taking an abx and gets the antifungal pill but hasn't been on abx recently. Pt is unable to come in for OV d/t transportation so scheduled VV for tomorrow at 0940.   Summary: Urinary vs vaginal symptoms     Pt called saying she either has BV or a UTI.  She has a discharge that smells and burns when she urinates also.  She does not have a ride to the office.  Please advise  CB#  [phone]          Call went to vm. Unable to leave message voice mail is full.

## 2022-05-19 NOTE — Telephone Encounter (Signed)
Reason for Disposition . [1] MILD-MODERATE pain AND [2] present > 24 hours  (Exception: Chronic pain.)  Answer Assessment - Initial Assessment Questions 1. SYMPTOM: "What's the main symptom you're concerned about?" (e.g., pain, itching, dryness)     Discharge and burning  2. LOCATION: "Where is the  sx located?" (e.g., inside/outside, left/right)     Vaginally  3. ONSET: "When did the  sx  start?"     Friday or Saturday  4. PAIN: "Is there any pain?" If Yes, ask: "How bad is it?" (Scale: 1-10; mild, moderate, severe)   -  MILD (1-3): Doesn't interfere with normal activities.    -  MODERATE (4-7): Interferes with normal activities (e.g., work or school) or awakens from sleep.     -  SEVERE (8-10): Excruciating pain, unable to do any normal activities.     Mild  5. ITCHING: "Is there any itching?" If Yes, ask: "How bad is it?" (Scale: 1-10; mild, moderate, severe)     no 6. CAUSE: "What do you think is causing the discharge?" "Have you had the same problem before? What happened then?"     BV or UTI  7. OTHER SYMPTOMS: "Do you have any other symptoms?" (e.g., fever, itching, vaginal bleeding, pain with urination, injury to genital area, vaginal foreign body)     Burning with urinary and slight fishy odor  Protocols used: Vaginal Symptoms-A-AH

## 2022-05-19 NOTE — Telephone Encounter (Signed)
Summary: Urinary vs vaginal symptoms   Pt called saying she either has BV or a UTI.  She has a discharge that smells and burns when she urinates also.  She does not have a ride to the office.  Please advise  CB#  [phone]     Called pt  - unable to leave message mailbox is full.

## 2022-05-19 NOTE — Telephone Encounter (Signed)
Pt called saying she either has BV or a UTI.  She has a discharge that smells and burns when she urinates also.  She does not have a ride to the office.  Please advise   CB#  [phone]   Voice mailbox full, unable to leave a message.

## 2022-05-20 ENCOUNTER — Telehealth (INDEPENDENT_AMBULATORY_CARE_PROVIDER_SITE_OTHER): Payer: Medicare Other | Admitting: Family Medicine

## 2022-05-20 ENCOUNTER — Encounter: Payer: Self-pay | Admitting: Family Medicine

## 2022-05-20 VITALS — Temp 97.8°F

## 2022-05-20 DIAGNOSIS — N760 Acute vaginitis: Secondary | ICD-10-CM | POA: Diagnosis not present

## 2022-05-20 MED ORDER — FLUCONAZOLE 150 MG PO TABS: 150.0000 mg | ORAL_TABLET | Freq: Once | ORAL | 0 refills | Status: AC

## 2022-05-20 MED ORDER — METRONIDAZOLE 500 MG PO TABS: 500.0000 mg | ORAL_TABLET | Freq: Two times a day (BID) | ORAL | 0 refills | Status: DC

## 2022-05-20 NOTE — Progress Notes (Signed)
I,Sulibeya S Dimas,acting as a Education administrator for Christina Paganini, MD.,have documented all relevant documentation on the behalf of Christina Paganini, MD,as directed by  Christina Paganini, MD while in the presence of Christina Paganini, MD.   MyChart Video Visit    Virtual Visit via Video Note   This format is felt to be most appropriate for this patient at this time. Physical exam was limited by quality of the video and audio technology used for the visit.    Patient location: home Provider location: [place] involved in the visit: patient, provider   I discussed the limitations of evaluation and management by telemedicine and the availability of in person appointments. The patient expressed understanding and agreed to proceed.  Patient: Christina Rubio   DOB: [birth year]   72 y.o. Female  MRN: [id number] Visit Date: 05/20/2022  Today's healthcare provider: Lavon Paganini, MD   Chief Complaint  Patient presents with   Vaginal Discharge   Subjective    Vaginal Discharge The patient's primary symptoms include genital itching, a genital odor and vaginal discharge. The patient's pertinent negatives include no genital rash or vaginal bleeding. This is a new problem. The current episode started in the past 7 days. The problem occurs constantly. The problem has been unchanged. The pain is mild. Pertinent negatives include no abdominal pain, chills, dysuria, fever, flank pain, hematuria, nausea or vomiting. The vaginal discharge was milky and malodorous. There has been no bleeding. She has not been passing clots. She has not been passing tissue.     Medications: Outpatient Medications Prior to Visit  Medication Sig   ALPRAZolam (XANAX) 0.5 MG tablet TAKE 1 TABLET BY MOUTH EVERY DAY AS NEEDED FOR ANXIETY.   atorvastatin (LIPITOR) 20 MG tablet TAKE 1 TABLET BY MOUTH DAILY   blood glucose meter kit and supplies Dispense based on patient and insurance preference. Use  once daily to check for fasting blood sugars. (FOR ICD-10 E10.9, E11.9).   Glucose Blood (BLOOD GLUCOSE TEST STRIPS 333) STRP Dispense based on patient and insurance preference. Use once daily to check for fasting blood sugars. (ICD-10 E11.69)   levothyroxine (SYNTHROID) 112 MCG tablet TAKE 1 TABLET EVERY DAY ON EMPTY STOMACHWITH A GLASS OF WATER AT LEAST 30-60 MINBEFORE BREAKFAST   loperamide (IMODIUM A-D) 2 MG tablet Take 1 tablet (2 mg total) by mouth 4 (four) times daily as needed for diarrhea or loose stools.   metoprolol succinate (TOPROL-XL) 50 MG 24 hr tablet TAKE 1 TABLET BY MOUTH DAILY WITH OR IMMEDIATELY FOLLOWING A MEAL   Multiple Vitamins-Minerals (PRESERVISION AREDS 2) CAPS Take 1 tablet by mouth 2 (two) times daily.   Probiotic Product (PROBIOTIC PO) Take by mouth daily.   sertraline (ZOLOFT) 100 MG tablet Take 2 tablets (200 mg total) by mouth daily.   triamcinolone ointment (KENALOG) 0.5 % APPLY TO AFFECTED AREAS TOPICALLY TWICE DAILY   valsartan-hydrochlorothiazide (DIOVAN-HCT) 160-25 MG tablet TAKE 1 TABLET BY MOUTH NIGHTLY   metFORMIN (GLUCOPHAGE-XR) 500 MG 24 hr tablet TAKE ONE TABLET EVERY DAY WITH BREAKFAST (Patient not taking: Reported on 05/20/2022)   ondansetron (ZOFRAN ODT) 4 MG disintegrating tablet Take 1 tablet (4 mg total) by mouth every 8 (eight) hours as needed. (Patient not taking: Reported on 05/20/2022)   terbinafine (LAMISIL) 250 MG tablet Take 1 tablet (250 mg total) by mouth daily. (Patient not taking: Reported on 05/20/2022)   No facility-administered medications prior to visit.    Review of Systems  Constitutional:  Negative for chills and  fever.  Gastrointestinal:  Negative for abdominal pain, nausea and vomiting.  Genitourinary:  Positive for vaginal discharge. Negative for dysuria, flank pain and hematuria.       Objective    Temp 97.8 F (36.6 C) (Temporal)      Physical Exam Constitutional:      General: She is not in acute distress.     Appearance: Normal appearance.  HENT:     Head: Normocephalic.  Pulmonary:     Effort: Pulmonary effort is normal. No respiratory distress.  Neurological:     Mental Status: She is alert and oriented to person, place, and time. Mental status is at baseline.        Assessment & Plan     1. Acute vaginitis - new problem - sounds more like BV than yeast, but unclear and patient often gets post-abx yeast infection - will Rx flagyl and Diflucan (to take after flagyl) - return precautions discussed  Meds ordered this encounter  Medications   metroNIDAZOLE (FLAGYL) 500 MG tablet    Sig: Take 1 tablet (500 mg total) by mouth 2 (two) times daily.    Dispense:  14 tablet    Refill:  0   fluconazole (DIFLUCAN) 150 MG tablet    Sig: Take 1 tablet (150 mg total) by mouth once for 1 dose.    Dispense:  1 tablet    Refill:  0     No follow-ups on file.     I discussed the assessment and treatment plan with the patient. The patient was provided an opportunity to ask questions and all were answered. The patient agreed with the plan and demonstrated an understanding of the instructions.   The patient was advised to call back or seek an in-person evaluation if the symptoms worsen or if the condition fails to improve as anticipated.  I, Christina Paganini, MD, have reviewed all documentation for this visit. The documentation on 05/20/22 for the exam, diagnosis, procedures, and orders are all accurate and complete.   Christina Rubio, Dionne Bucy, MD, MPH [facility]

## 2022-05-20 NOTE — Telephone Encounter (Signed)
[facility] called patient yesterday asking her to please secure her dogs. So that a staff member can do her PT/OT at home. They will reach out to her again today. before they close out her referral.

## 2022-05-21 ENCOUNTER — Other Ambulatory Visit: Payer: Self-pay

## 2022-05-21 ENCOUNTER — Telehealth: Payer: Self-pay

## 2022-05-21 DIAGNOSIS — M159 Polyosteoarthritis, unspecified: Secondary | ICD-10-CM

## 2022-05-21 DIAGNOSIS — Z6841 Body Mass Index (BMI) 40.0 and over, adult: Secondary | ICD-10-CM

## 2022-05-21 DIAGNOSIS — R262 Difficulty in walking, not elsewhere classified: Secondary | ICD-10-CM

## 2022-05-21 DIAGNOSIS — R600 Localized edema: Secondary | ICD-10-CM

## 2022-05-21 DIAGNOSIS — M4807 Spinal stenosis, lumbosacral region: Secondary | ICD-10-CM

## 2022-05-21 DIAGNOSIS — G8929 Other chronic pain: Secondary | ICD-10-CM

## 2022-05-21 DIAGNOSIS — Z9989 Dependence on other enabling machines and devices: Secondary | ICD-10-CM

## 2022-05-21 NOTE — Telephone Encounter (Signed)
Copied from [place] [phone]. Topic: General - Other >> May 21, 2022  4:56 PM [place] B wrote: Reason for CRM: patient called in with medicare # group [id number] and id [id number]

## 2022-05-21 NOTE — Progress Notes (Signed)
04/28/22  9:55 AM Note Pt is calling to request if referral for OT & PT can be changed to [facility]. Please advise CB- [place]  Does not want to come to patients home because she has dogs.      New referral placed today.

## 2022-05-22 NOTE — Telephone Encounter (Signed)
I ran this insurance and it is inactive.  It looks like this is the same policy number we had for last year.  [employer] medicare policy numbers have changed this year.  I tried to call pt to get correct policy number but vm was full.  If pt calls back please get correct policy number from pt.

## 2022-06-02 ENCOUNTER — Telehealth: Payer: Self-pay | Admitting: Family Medicine

## 2022-06-02 NOTE — Telephone Encounter (Signed)
Home Health Verbal Orders - Caller/Agency: Phu from [facility] Number: [phone] Requesting PT Frequency: evalutation/[facility] wants to change the date they had for today until tomorrow

## 2022-06-02 NOTE — Telephone Encounter (Signed)
Phu advised of approved orders.

## 2022-06-02 NOTE — Telephone Encounter (Signed)
OK for verbals 

## 2022-06-03 DIAGNOSIS — I890 Lymphedema, not elsewhere classified: Secondary | ICD-10-CM | POA: Diagnosis not present

## 2022-06-03 DIAGNOSIS — Z9181 History of falling: Secondary | ICD-10-CM | POA: Diagnosis not present

## 2022-06-03 DIAGNOSIS — E034 Atrophy of thyroid (acquired): Secondary | ICD-10-CM | POA: Diagnosis not present

## 2022-06-03 DIAGNOSIS — F411 Generalized anxiety disorder: Secondary | ICD-10-CM | POA: Diagnosis not present

## 2022-06-03 DIAGNOSIS — I152 Hypertension secondary to endocrine disorders: Secondary | ICD-10-CM | POA: Diagnosis not present

## 2022-06-03 DIAGNOSIS — E1169 Type 2 diabetes mellitus with other specified complication: Secondary | ICD-10-CM | POA: Diagnosis not present

## 2022-06-03 DIAGNOSIS — E1151 Type 2 diabetes mellitus with diabetic peripheral angiopathy without gangrene: Secondary | ICD-10-CM | POA: Diagnosis not present

## 2022-06-03 DIAGNOSIS — I872 Venous insufficiency (chronic) (peripheral): Secondary | ICD-10-CM | POA: Diagnosis not present

## 2022-06-03 DIAGNOSIS — M4807 Spinal stenosis, lumbosacral region: Secondary | ICD-10-CM | POA: Diagnosis not present

## 2022-06-03 DIAGNOSIS — R1319 Other dysphagia: Secondary | ICD-10-CM | POA: Diagnosis not present

## 2022-06-03 DIAGNOSIS — G8929 Other chronic pain: Secondary | ICD-10-CM | POA: Diagnosis not present

## 2022-06-03 DIAGNOSIS — M479 Spondylosis, unspecified: Secondary | ICD-10-CM | POA: Diagnosis not present

## 2022-06-03 DIAGNOSIS — Z6841 Body Mass Index (BMI) 40.0 and over, adult: Secondary | ICD-10-CM | POA: Diagnosis not present

## 2022-06-03 DIAGNOSIS — F329 Major depressive disorder, single episode, unspecified: Secondary | ICD-10-CM | POA: Diagnosis not present

## 2022-06-03 DIAGNOSIS — E785 Hyperlipidemia, unspecified: Secondary | ICD-10-CM | POA: Diagnosis not present

## 2022-06-03 DIAGNOSIS — M170 Bilateral primary osteoarthritis of knee: Secondary | ICD-10-CM | POA: Diagnosis not present

## 2022-06-03 DIAGNOSIS — E1159 Type 2 diabetes mellitus with other circulatory complications: Secondary | ICD-10-CM | POA: Diagnosis not present

## 2022-06-04 DIAGNOSIS — E785 Hyperlipidemia, unspecified: Secondary | ICD-10-CM | POA: Diagnosis not present

## 2022-06-04 DIAGNOSIS — M170 Bilateral primary osteoarthritis of knee: Secondary | ICD-10-CM | POA: Diagnosis not present

## 2022-06-04 DIAGNOSIS — M479 Spondylosis, unspecified: Secondary | ICD-10-CM | POA: Diagnosis not present

## 2022-06-04 DIAGNOSIS — F411 Generalized anxiety disorder: Secondary | ICD-10-CM | POA: Diagnosis not present

## 2022-06-04 DIAGNOSIS — M4807 Spinal stenosis, lumbosacral region: Secondary | ICD-10-CM | POA: Diagnosis not present

## 2022-06-04 DIAGNOSIS — F329 Major depressive disorder, single episode, unspecified: Secondary | ICD-10-CM | POA: Diagnosis not present

## 2022-06-04 DIAGNOSIS — I152 Hypertension secondary to endocrine disorders: Secondary | ICD-10-CM | POA: Diagnosis not present

## 2022-06-04 DIAGNOSIS — I872 Venous insufficiency (chronic) (peripheral): Secondary | ICD-10-CM | POA: Diagnosis not present

## 2022-06-04 DIAGNOSIS — R1319 Other dysphagia: Secondary | ICD-10-CM | POA: Diagnosis not present

## 2022-06-04 DIAGNOSIS — G8929 Other chronic pain: Secondary | ICD-10-CM | POA: Diagnosis not present

## 2022-06-04 DIAGNOSIS — I890 Lymphedema, not elsewhere classified: Secondary | ICD-10-CM | POA: Diagnosis not present

## 2022-06-04 DIAGNOSIS — E1169 Type 2 diabetes mellitus with other specified complication: Secondary | ICD-10-CM | POA: Diagnosis not present

## 2022-06-04 DIAGNOSIS — E1159 Type 2 diabetes mellitus with other circulatory complications: Secondary | ICD-10-CM | POA: Diagnosis not present

## 2022-06-04 DIAGNOSIS — E034 Atrophy of thyroid (acquired): Secondary | ICD-10-CM | POA: Diagnosis not present

## 2022-06-04 DIAGNOSIS — E1151 Type 2 diabetes mellitus with diabetic peripheral angiopathy without gangrene: Secondary | ICD-10-CM | POA: Diagnosis not present

## 2022-06-05 NOTE — Telephone Encounter (Signed)
OK for verbals 

## 2022-06-05 NOTE — Telephone Encounter (Signed)
Christina Rubio is calling to request verbal orders for Frequency 2 w 4, 1 w 4 Cb- [phone] Verbal ok on VM

## 2022-06-06 ENCOUNTER — Telehealth: Payer: Self-pay

## 2022-06-06 NOTE — Telephone Encounter (Signed)
Copied from [place] [phone]. Topic: General - Other >> Jun 06, 2022 11:01 AM Tiffany B wrote: Felicity Coyer  OT from [facility]  [phone]    Requesting OT home health health orders for  1x 8,

## 2022-06-06 NOTE — Telephone Encounter (Signed)
Advised of approved orders as noted on previous message.

## 2022-06-06 NOTE — Telephone Encounter (Signed)
Left voicemail for [facility] advising of approved orders.

## 2022-06-10 DIAGNOSIS — F329 Major depressive disorder, single episode, unspecified: Secondary | ICD-10-CM | POA: Diagnosis not present

## 2022-06-10 DIAGNOSIS — M479 Spondylosis, unspecified: Secondary | ICD-10-CM | POA: Diagnosis not present

## 2022-06-10 DIAGNOSIS — E1151 Type 2 diabetes mellitus with diabetic peripheral angiopathy without gangrene: Secondary | ICD-10-CM | POA: Diagnosis not present

## 2022-06-10 DIAGNOSIS — E785 Hyperlipidemia, unspecified: Secondary | ICD-10-CM | POA: Diagnosis not present

## 2022-06-10 DIAGNOSIS — M4807 Spinal stenosis, lumbosacral region: Secondary | ICD-10-CM | POA: Diagnosis not present

## 2022-06-10 DIAGNOSIS — E1169 Type 2 diabetes mellitus with other specified complication: Secondary | ICD-10-CM | POA: Diagnosis not present

## 2022-06-10 DIAGNOSIS — F411 Generalized anxiety disorder: Secondary | ICD-10-CM | POA: Diagnosis not present

## 2022-06-10 DIAGNOSIS — I890 Lymphedema, not elsewhere classified: Secondary | ICD-10-CM | POA: Diagnosis not present

## 2022-06-10 DIAGNOSIS — I152 Hypertension secondary to endocrine disorders: Secondary | ICD-10-CM | POA: Diagnosis not present

## 2022-06-10 DIAGNOSIS — M170 Bilateral primary osteoarthritis of knee: Secondary | ICD-10-CM | POA: Diagnosis not present

## 2022-06-10 DIAGNOSIS — G8929 Other chronic pain: Secondary | ICD-10-CM | POA: Diagnosis not present

## 2022-06-10 DIAGNOSIS — E034 Atrophy of thyroid (acquired): Secondary | ICD-10-CM | POA: Diagnosis not present

## 2022-06-10 DIAGNOSIS — I872 Venous insufficiency (chronic) (peripheral): Secondary | ICD-10-CM | POA: Diagnosis not present

## 2022-06-10 DIAGNOSIS — R1319 Other dysphagia: Secondary | ICD-10-CM | POA: Diagnosis not present

## 2022-06-10 DIAGNOSIS — E1159 Type 2 diabetes mellitus with other circulatory complications: Secondary | ICD-10-CM | POA: Diagnosis not present

## 2022-06-11 DIAGNOSIS — E785 Hyperlipidemia, unspecified: Secondary | ICD-10-CM | POA: Diagnosis not present

## 2022-06-11 DIAGNOSIS — M170 Bilateral primary osteoarthritis of knee: Secondary | ICD-10-CM | POA: Diagnosis not present

## 2022-06-11 DIAGNOSIS — M4807 Spinal stenosis, lumbosacral region: Secondary | ICD-10-CM | POA: Diagnosis not present

## 2022-06-11 DIAGNOSIS — R1319 Other dysphagia: Secondary | ICD-10-CM | POA: Diagnosis not present

## 2022-06-11 DIAGNOSIS — F329 Major depressive disorder, single episode, unspecified: Secondary | ICD-10-CM | POA: Diagnosis not present

## 2022-06-11 DIAGNOSIS — E1169 Type 2 diabetes mellitus with other specified complication: Secondary | ICD-10-CM | POA: Diagnosis not present

## 2022-06-11 DIAGNOSIS — M479 Spondylosis, unspecified: Secondary | ICD-10-CM | POA: Diagnosis not present

## 2022-06-11 DIAGNOSIS — E1159 Type 2 diabetes mellitus with other circulatory complications: Secondary | ICD-10-CM | POA: Diagnosis not present

## 2022-06-11 DIAGNOSIS — I152 Hypertension secondary to endocrine disorders: Secondary | ICD-10-CM | POA: Diagnosis not present

## 2022-06-11 DIAGNOSIS — F411 Generalized anxiety disorder: Secondary | ICD-10-CM | POA: Diagnosis not present

## 2022-06-11 DIAGNOSIS — E1151 Type 2 diabetes mellitus with diabetic peripheral angiopathy without gangrene: Secondary | ICD-10-CM | POA: Diagnosis not present

## 2022-06-11 DIAGNOSIS — I890 Lymphedema, not elsewhere classified: Secondary | ICD-10-CM | POA: Diagnosis not present

## 2022-06-11 DIAGNOSIS — I872 Venous insufficiency (chronic) (peripheral): Secondary | ICD-10-CM | POA: Diagnosis not present

## 2022-06-11 DIAGNOSIS — E034 Atrophy of thyroid (acquired): Secondary | ICD-10-CM | POA: Diagnosis not present

## 2022-06-11 DIAGNOSIS — G8929 Other chronic pain: Secondary | ICD-10-CM | POA: Diagnosis not present

## 2022-06-13 DIAGNOSIS — M4807 Spinal stenosis, lumbosacral region: Secondary | ICD-10-CM | POA: Diagnosis not present

## 2022-06-13 DIAGNOSIS — F411 Generalized anxiety disorder: Secondary | ICD-10-CM | POA: Diagnosis not present

## 2022-06-13 DIAGNOSIS — E1159 Type 2 diabetes mellitus with other circulatory complications: Secondary | ICD-10-CM | POA: Diagnosis not present

## 2022-06-13 DIAGNOSIS — F329 Major depressive disorder, single episode, unspecified: Secondary | ICD-10-CM | POA: Diagnosis not present

## 2022-06-13 DIAGNOSIS — E785 Hyperlipidemia, unspecified: Secondary | ICD-10-CM | POA: Diagnosis not present

## 2022-06-13 DIAGNOSIS — M170 Bilateral primary osteoarthritis of knee: Secondary | ICD-10-CM | POA: Diagnosis not present

## 2022-06-13 DIAGNOSIS — G8929 Other chronic pain: Secondary | ICD-10-CM | POA: Diagnosis not present

## 2022-06-13 DIAGNOSIS — R1319 Other dysphagia: Secondary | ICD-10-CM | POA: Diagnosis not present

## 2022-06-13 DIAGNOSIS — E1169 Type 2 diabetes mellitus with other specified complication: Secondary | ICD-10-CM | POA: Diagnosis not present

## 2022-06-13 DIAGNOSIS — E1151 Type 2 diabetes mellitus with diabetic peripheral angiopathy without gangrene: Secondary | ICD-10-CM | POA: Diagnosis not present

## 2022-06-13 DIAGNOSIS — I152 Hypertension secondary to endocrine disorders: Secondary | ICD-10-CM | POA: Diagnosis not present

## 2022-06-13 DIAGNOSIS — E034 Atrophy of thyroid (acquired): Secondary | ICD-10-CM | POA: Diagnosis not present

## 2022-06-13 DIAGNOSIS — M479 Spondylosis, unspecified: Secondary | ICD-10-CM | POA: Diagnosis not present

## 2022-06-13 DIAGNOSIS — I890 Lymphedema, not elsewhere classified: Secondary | ICD-10-CM | POA: Diagnosis not present

## 2022-06-13 DIAGNOSIS — I872 Venous insufficiency (chronic) (peripheral): Secondary | ICD-10-CM | POA: Diagnosis not present

## 2022-06-17 ENCOUNTER — Telehealth: Payer: Self-pay

## 2022-06-17 ENCOUNTER — Ambulatory Visit: Payer: BLUE CROSS/BLUE SHIELD | Admitting: Physician Assistant

## 2022-06-17 ENCOUNTER — Ambulatory Visit: Payer: Self-pay

## 2022-06-17 NOTE — Telephone Encounter (Signed)
Patient has appointment at 4   Copied from [place] [phone]. Topic: Appointment Scheduling - Scheduling Inquiry for Clinic >> Jun 17, 2022 10:23 AM [place] George T wrote: Reason for CRM: Patient called stated she is getting a ride by someone who doesn't get off until 3pm but she wanted the dr to know she is coming

## 2022-06-17 NOTE — Telephone Encounter (Signed)
  Chief Complaint: Upper left abdominal pain Symptoms: Pain with movement, Muscle strain? Frequency: yesterday Pertinent Negatives: Patient denies fever Disposition: [] ED /[] Urgent Care (no appt availability in office) / [x] Appointment(In office/virtual)/ []  [facility]/ [] Home Care/ [] Refused Recommended Disposition /[] [facility]/ []  Follow-up with PCP Additional Notes: PT states that starting yesterday she has pain in her left upper abdomen that might be a muscle pull. No pain when no moving, sharp pain when getting up that takes her breath away.  PT is trying to find transportation to come in for OV this afternoon.  Summary: abdominal pain   Abdominal since yesterday,  patient stated she could not hold on the line and wait for nurse because she is handicap and need to go to the restroom.     Reason for Disposition  [1] MILD-MODERATE pain AND [2] constant AND [3] present > 2 hours  Answer Assessment - Initial Assessment Questions 1. LOCATION: "Where does it hurt?"      Left between hip and rib cage 2. RADIATION: "Does the pain shoot anywhere else?" (e.g., chest, back)     no 3. ONSET: "When did the pain begin?" (e.g., minutes, hours or days ago)      yesterday 4. SUDDEN: "Gradual or sudden onset?"     Gradual 5. PATTERN "Does the pain come and go, or is it constant?"    - If it comes and goes: "How long does it last?" "Do you have pain now?"     (Note: Comes and goes means the pain is intermittent. It goes away completely between bouts.)    - If constant: "Is it getting better, staying the same, or getting worse?"      (Note: Constant means the pain never goes away completely; most serious pain is constant and gets worse.)      No pain that when not moving 6. SEVERITY: "How bad is the pain?"  (e.g., Scale 1-10; mild, moderate, or severe)    - MILD (1-3): Doesn't interfere with normal activities, abdomen soft and not tender to touch.     - MODERATE (4-7):  Interferes with normal activities or awakens from sleep, abdomen tender to touch.     - SEVERE (8-10): Excruciating pain, doubled over, unable to do any normal activities.       Currently 3/10 7. RECURRENT SYMPTOM: "Have you ever had this type of stomach pain before?" If Yes, ask: "When was the last time?" and "What happened that time?"      no 8. CAUSE: "What do you think is causing the stomach pain?"     Pulled muscle 9. RELIEVING/AGGRAVATING FACTORS: "What makes it better or worse?" (e.g., antacids, bending or twisting motion, bowel movement)     Standing makes it worse.  10. OTHER SYMPTOMS: "Do you have any other symptoms?" (e.g., back pain, diarrhea, fever, urination pain, vomiting)       no  Protocols used: Abdominal Pain - Female-A-AH

## 2022-06-17 NOTE — Progress Notes (Deleted)
     I,Sha'taria Millie Shorb,acting as a Education administrator for [employer], PA-C.,have documented all relevant documentation on the behalf of Mikey Kirschner, PA-C,as directed by  Mikey Kirschner, PA-C while in the presence of Mikey Kirschner, PA-C.   Established patient visit   Patient: Christina Rubio   DOB: 19-Jan-1951   72 y.o. Female  MRN: [id number] Visit Date: 06/17/2022  Today's healthcare provider: Mikey Kirschner, PA-C   No chief complaint on file.  Subjective    Abdominal Pain     Medications: Outpatient Medications Prior to Visit  Medication Sig  . ALPRAZolam (XANAX) 0.5 MG tablet TAKE 1 TABLET BY MOUTH EVERY DAY AS NEEDED FOR ANXIETY.  Marland Kitchen atorvastatin (LIPITOR) 20 MG tablet TAKE 1 TABLET BY MOUTH DAILY  . blood glucose meter kit and supplies Dispense based on patient and insurance preference. Use once daily to check for fasting blood sugars. (FOR ICD-10 E10.9, E11.9).  . Glucose Blood (BLOOD GLUCOSE TEST STRIPS 333) STRP Dispense based on patient and insurance preference. Use once daily to check for fasting blood sugars. (ICD-10 E11.69)  . levothyroxine (SYNTHROID) 112 MCG tablet TAKE 1 TABLET EVERY DAY ON EMPTY STOMACHWITH A GLASS OF WATER AT LEAST 30-60 MINBEFORE BREAKFAST  . loperamide (IMODIUM A-D) 2 MG tablet Take 1 tablet (2 mg total) by mouth 4 (four) times daily as needed for diarrhea or loose stools.  . metoprolol succinate (TOPROL-XL) 50 MG 24 hr tablet TAKE 1 TABLET BY MOUTH DAILY WITH OR IMMEDIATELY FOLLOWING A MEAL  . metroNIDAZOLE (FLAGYL) 500 MG tablet Take 1 tablet (500 mg total) by mouth 2 (two) times daily.  . Multiple Vitamins-Minerals (PRESERVISION AREDS 2) CAPS Take 1 tablet by mouth 2 (two) times daily.  . Probiotic Product (PROBIOTIC PO) Take by mouth daily.  . sertraline (ZOLOFT) 100 MG tablet Take 2 tablets (200 mg total) by mouth daily.  Marland Kitchen triamcinolone ointment (KENALOG) 0.5 % APPLY TO AFFECTED AREAS TOPICALLY TWICE DAILY  . valsartan-hydrochlorothiazide  (DIOVAN-HCT) 160-25 MG tablet TAKE 1 TABLET BY MOUTH NIGHTLY   No facility-administered medications prior to visit.    Review of Systems  Gastrointestinal:  Positive for abdominal pain.   {Labs  Heme  Chem  Endocrine  Serology  Results Review (optional):23779}   Objective    There were no vitals taken for this visit. {Show previous vital signs (optional):23777}  Physical Exam  ***  No results found for any visits on 06/17/22.  Assessment & Plan     ***  No follow-ups on file.      {provider attestation***:1}   Mikey Kirschner, PA-C  [place] [phone] (phone) [phone] (fax)  [place]

## 2022-06-18 ENCOUNTER — Ambulatory Visit: Payer: BLUE CROSS/BLUE SHIELD | Admitting: Physician Assistant

## 2022-06-19 ENCOUNTER — Other Ambulatory Visit: Payer: Self-pay | Admitting: Family Medicine

## 2022-06-19 DIAGNOSIS — R1319 Other dysphagia: Secondary | ICD-10-CM | POA: Diagnosis not present

## 2022-06-19 DIAGNOSIS — E1169 Type 2 diabetes mellitus with other specified complication: Secondary | ICD-10-CM | POA: Diagnosis not present

## 2022-06-19 DIAGNOSIS — M479 Spondylosis, unspecified: Secondary | ICD-10-CM | POA: Diagnosis not present

## 2022-06-19 DIAGNOSIS — F418 Other specified anxiety disorders: Secondary | ICD-10-CM

## 2022-06-19 DIAGNOSIS — I152 Hypertension secondary to endocrine disorders: Secondary | ICD-10-CM | POA: Diagnosis not present

## 2022-06-19 DIAGNOSIS — E1151 Type 2 diabetes mellitus with diabetic peripheral angiopathy without gangrene: Secondary | ICD-10-CM | POA: Diagnosis not present

## 2022-06-19 DIAGNOSIS — M4807 Spinal stenosis, lumbosacral region: Secondary | ICD-10-CM | POA: Diagnosis not present

## 2022-06-19 DIAGNOSIS — I872 Venous insufficiency (chronic) (peripheral): Secondary | ICD-10-CM | POA: Diagnosis not present

## 2022-06-19 DIAGNOSIS — G8929 Other chronic pain: Secondary | ICD-10-CM | POA: Diagnosis not present

## 2022-06-19 DIAGNOSIS — F411 Generalized anxiety disorder: Secondary | ICD-10-CM | POA: Diagnosis not present

## 2022-06-19 DIAGNOSIS — I890 Lymphedema, not elsewhere classified: Secondary | ICD-10-CM | POA: Diagnosis not present

## 2022-06-19 DIAGNOSIS — F329 Major depressive disorder, single episode, unspecified: Secondary | ICD-10-CM | POA: Diagnosis not present

## 2022-06-19 DIAGNOSIS — E785 Hyperlipidemia, unspecified: Secondary | ICD-10-CM | POA: Diagnosis not present

## 2022-06-19 DIAGNOSIS — M170 Bilateral primary osteoarthritis of knee: Secondary | ICD-10-CM | POA: Diagnosis not present

## 2022-06-19 DIAGNOSIS — E1159 Type 2 diabetes mellitus with other circulatory complications: Secondary | ICD-10-CM | POA: Diagnosis not present

## 2022-06-19 DIAGNOSIS — E034 Atrophy of thyroid (acquired): Secondary | ICD-10-CM | POA: Diagnosis not present

## 2022-06-20 DIAGNOSIS — R1319 Other dysphagia: Secondary | ICD-10-CM | POA: Diagnosis not present

## 2022-06-20 DIAGNOSIS — E1159 Type 2 diabetes mellitus with other circulatory complications: Secondary | ICD-10-CM | POA: Diagnosis not present

## 2022-06-20 DIAGNOSIS — G8929 Other chronic pain: Secondary | ICD-10-CM | POA: Diagnosis not present

## 2022-06-20 DIAGNOSIS — E1151 Type 2 diabetes mellitus with diabetic peripheral angiopathy without gangrene: Secondary | ICD-10-CM | POA: Diagnosis not present

## 2022-06-20 DIAGNOSIS — M170 Bilateral primary osteoarthritis of knee: Secondary | ICD-10-CM | POA: Diagnosis not present

## 2022-06-20 DIAGNOSIS — E034 Atrophy of thyroid (acquired): Secondary | ICD-10-CM | POA: Diagnosis not present

## 2022-06-20 DIAGNOSIS — M479 Spondylosis, unspecified: Secondary | ICD-10-CM | POA: Diagnosis not present

## 2022-06-20 DIAGNOSIS — M4807 Spinal stenosis, lumbosacral region: Secondary | ICD-10-CM | POA: Diagnosis not present

## 2022-06-20 DIAGNOSIS — I872 Venous insufficiency (chronic) (peripheral): Secondary | ICD-10-CM | POA: Diagnosis not present

## 2022-06-20 DIAGNOSIS — I890 Lymphedema, not elsewhere classified: Secondary | ICD-10-CM | POA: Diagnosis not present

## 2022-06-20 DIAGNOSIS — I152 Hypertension secondary to endocrine disorders: Secondary | ICD-10-CM | POA: Diagnosis not present

## 2022-06-20 DIAGNOSIS — E1169 Type 2 diabetes mellitus with other specified complication: Secondary | ICD-10-CM | POA: Diagnosis not present

## 2022-06-20 DIAGNOSIS — F411 Generalized anxiety disorder: Secondary | ICD-10-CM | POA: Diagnosis not present

## 2022-06-20 DIAGNOSIS — F329 Major depressive disorder, single episode, unspecified: Secondary | ICD-10-CM | POA: Diagnosis not present

## 2022-06-20 DIAGNOSIS — E785 Hyperlipidemia, unspecified: Secondary | ICD-10-CM | POA: Diagnosis not present

## 2022-06-24 DIAGNOSIS — F329 Major depressive disorder, single episode, unspecified: Secondary | ICD-10-CM | POA: Diagnosis not present

## 2022-06-24 DIAGNOSIS — E034 Atrophy of thyroid (acquired): Secondary | ICD-10-CM | POA: Diagnosis not present

## 2022-06-24 DIAGNOSIS — G8929 Other chronic pain: Secondary | ICD-10-CM | POA: Diagnosis not present

## 2022-06-24 DIAGNOSIS — M479 Spondylosis, unspecified: Secondary | ICD-10-CM | POA: Diagnosis not present

## 2022-06-24 DIAGNOSIS — E785 Hyperlipidemia, unspecified: Secondary | ICD-10-CM | POA: Diagnosis not present

## 2022-06-24 DIAGNOSIS — E1169 Type 2 diabetes mellitus with other specified complication: Secondary | ICD-10-CM | POA: Diagnosis not present

## 2022-06-24 DIAGNOSIS — E1159 Type 2 diabetes mellitus with other circulatory complications: Secondary | ICD-10-CM | POA: Diagnosis not present

## 2022-06-24 DIAGNOSIS — M170 Bilateral primary osteoarthritis of knee: Secondary | ICD-10-CM | POA: Diagnosis not present

## 2022-06-24 DIAGNOSIS — I872 Venous insufficiency (chronic) (peripheral): Secondary | ICD-10-CM | POA: Diagnosis not present

## 2022-06-24 DIAGNOSIS — I890 Lymphedema, not elsewhere classified: Secondary | ICD-10-CM | POA: Diagnosis not present

## 2022-06-24 DIAGNOSIS — I152 Hypertension secondary to endocrine disorders: Secondary | ICD-10-CM | POA: Diagnosis not present

## 2022-06-24 DIAGNOSIS — E1151 Type 2 diabetes mellitus with diabetic peripheral angiopathy without gangrene: Secondary | ICD-10-CM | POA: Diagnosis not present

## 2022-06-24 DIAGNOSIS — R1319 Other dysphagia: Secondary | ICD-10-CM | POA: Diagnosis not present

## 2022-06-24 DIAGNOSIS — M4807 Spinal stenosis, lumbosacral region: Secondary | ICD-10-CM | POA: Diagnosis not present

## 2022-06-24 DIAGNOSIS — F411 Generalized anxiety disorder: Secondary | ICD-10-CM | POA: Diagnosis not present

## 2022-06-25 DIAGNOSIS — F411 Generalized anxiety disorder: Secondary | ICD-10-CM | POA: Diagnosis not present

## 2022-06-25 DIAGNOSIS — E1159 Type 2 diabetes mellitus with other circulatory complications: Secondary | ICD-10-CM | POA: Diagnosis not present

## 2022-06-25 DIAGNOSIS — E1169 Type 2 diabetes mellitus with other specified complication: Secondary | ICD-10-CM | POA: Diagnosis not present

## 2022-06-25 DIAGNOSIS — F329 Major depressive disorder, single episode, unspecified: Secondary | ICD-10-CM | POA: Diagnosis not present

## 2022-06-25 DIAGNOSIS — I890 Lymphedema, not elsewhere classified: Secondary | ICD-10-CM | POA: Diagnosis not present

## 2022-06-25 DIAGNOSIS — I152 Hypertension secondary to endocrine disorders: Secondary | ICD-10-CM | POA: Diagnosis not present

## 2022-06-25 DIAGNOSIS — G8929 Other chronic pain: Secondary | ICD-10-CM | POA: Diagnosis not present

## 2022-06-25 DIAGNOSIS — R1319 Other dysphagia: Secondary | ICD-10-CM | POA: Diagnosis not present

## 2022-06-25 DIAGNOSIS — E1151 Type 2 diabetes mellitus with diabetic peripheral angiopathy without gangrene: Secondary | ICD-10-CM | POA: Diagnosis not present

## 2022-06-25 DIAGNOSIS — M479 Spondylosis, unspecified: Secondary | ICD-10-CM | POA: Diagnosis not present

## 2022-06-25 DIAGNOSIS — I872 Venous insufficiency (chronic) (peripheral): Secondary | ICD-10-CM | POA: Diagnosis not present

## 2022-06-25 DIAGNOSIS — M4807 Spinal stenosis, lumbosacral region: Secondary | ICD-10-CM | POA: Diagnosis not present

## 2022-06-25 DIAGNOSIS — M170 Bilateral primary osteoarthritis of knee: Secondary | ICD-10-CM | POA: Diagnosis not present

## 2022-06-25 DIAGNOSIS — E034 Atrophy of thyroid (acquired): Secondary | ICD-10-CM | POA: Diagnosis not present

## 2022-06-25 DIAGNOSIS — E785 Hyperlipidemia, unspecified: Secondary | ICD-10-CM | POA: Diagnosis not present

## 2022-06-30 DIAGNOSIS — M170 Bilateral primary osteoarthritis of knee: Secondary | ICD-10-CM | POA: Diagnosis not present

## 2022-06-30 DIAGNOSIS — E1151 Type 2 diabetes mellitus with diabetic peripheral angiopathy without gangrene: Secondary | ICD-10-CM | POA: Diagnosis not present

## 2022-06-30 DIAGNOSIS — I152 Hypertension secondary to endocrine disorders: Secondary | ICD-10-CM | POA: Diagnosis not present

## 2022-06-30 DIAGNOSIS — F329 Major depressive disorder, single episode, unspecified: Secondary | ICD-10-CM | POA: Diagnosis not present

## 2022-06-30 DIAGNOSIS — G8929 Other chronic pain: Secondary | ICD-10-CM | POA: Diagnosis not present

## 2022-06-30 DIAGNOSIS — M479 Spondylosis, unspecified: Secondary | ICD-10-CM | POA: Diagnosis not present

## 2022-06-30 DIAGNOSIS — M4807 Spinal stenosis, lumbosacral region: Secondary | ICD-10-CM | POA: Diagnosis not present

## 2022-06-30 DIAGNOSIS — E1169 Type 2 diabetes mellitus with other specified complication: Secondary | ICD-10-CM | POA: Diagnosis not present

## 2022-06-30 DIAGNOSIS — I872 Venous insufficiency (chronic) (peripheral): Secondary | ICD-10-CM | POA: Diagnosis not present

## 2022-06-30 DIAGNOSIS — I890 Lymphedema, not elsewhere classified: Secondary | ICD-10-CM | POA: Diagnosis not present

## 2022-06-30 DIAGNOSIS — E1159 Type 2 diabetes mellitus with other circulatory complications: Secondary | ICD-10-CM | POA: Diagnosis not present

## 2022-06-30 DIAGNOSIS — E785 Hyperlipidemia, unspecified: Secondary | ICD-10-CM | POA: Diagnosis not present

## 2022-06-30 DIAGNOSIS — E034 Atrophy of thyroid (acquired): Secondary | ICD-10-CM | POA: Diagnosis not present

## 2022-06-30 DIAGNOSIS — F411 Generalized anxiety disorder: Secondary | ICD-10-CM | POA: Diagnosis not present

## 2022-06-30 DIAGNOSIS — R1319 Other dysphagia: Secondary | ICD-10-CM | POA: Diagnosis not present

## 2022-07-01 DIAGNOSIS — I152 Hypertension secondary to endocrine disorders: Secondary | ICD-10-CM | POA: Diagnosis not present

## 2022-07-01 DIAGNOSIS — E1159 Type 2 diabetes mellitus with other circulatory complications: Secondary | ICD-10-CM | POA: Diagnosis not present

## 2022-07-01 DIAGNOSIS — I872 Venous insufficiency (chronic) (peripheral): Secondary | ICD-10-CM | POA: Diagnosis not present

## 2022-07-01 DIAGNOSIS — E1151 Type 2 diabetes mellitus with diabetic peripheral angiopathy without gangrene: Secondary | ICD-10-CM | POA: Diagnosis not present

## 2022-07-01 DIAGNOSIS — R1319 Other dysphagia: Secondary | ICD-10-CM | POA: Diagnosis not present

## 2022-07-01 DIAGNOSIS — G8929 Other chronic pain: Secondary | ICD-10-CM | POA: Diagnosis not present

## 2022-07-01 DIAGNOSIS — M170 Bilateral primary osteoarthritis of knee: Secondary | ICD-10-CM | POA: Diagnosis not present

## 2022-07-01 DIAGNOSIS — E034 Atrophy of thyroid (acquired): Secondary | ICD-10-CM | POA: Diagnosis not present

## 2022-07-01 DIAGNOSIS — M4807 Spinal stenosis, lumbosacral region: Secondary | ICD-10-CM | POA: Diagnosis not present

## 2022-07-01 DIAGNOSIS — E1169 Type 2 diabetes mellitus with other specified complication: Secondary | ICD-10-CM | POA: Diagnosis not present

## 2022-07-01 DIAGNOSIS — F329 Major depressive disorder, single episode, unspecified: Secondary | ICD-10-CM | POA: Diagnosis not present

## 2022-07-01 DIAGNOSIS — F411 Generalized anxiety disorder: Secondary | ICD-10-CM | POA: Diagnosis not present

## 2022-07-01 DIAGNOSIS — M479 Spondylosis, unspecified: Secondary | ICD-10-CM | POA: Diagnosis not present

## 2022-07-01 DIAGNOSIS — I890 Lymphedema, not elsewhere classified: Secondary | ICD-10-CM | POA: Diagnosis not present

## 2022-07-01 DIAGNOSIS — E785 Hyperlipidemia, unspecified: Secondary | ICD-10-CM | POA: Diagnosis not present

## 2022-07-03 DIAGNOSIS — G8929 Other chronic pain: Secondary | ICD-10-CM | POA: Diagnosis not present

## 2022-07-03 DIAGNOSIS — M170 Bilateral primary osteoarthritis of knee: Secondary | ICD-10-CM | POA: Diagnosis not present

## 2022-07-03 DIAGNOSIS — Z9181 History of falling: Secondary | ICD-10-CM | POA: Diagnosis not present

## 2022-07-03 DIAGNOSIS — M479 Spondylosis, unspecified: Secondary | ICD-10-CM | POA: Diagnosis not present

## 2022-07-03 DIAGNOSIS — I152 Hypertension secondary to endocrine disorders: Secondary | ICD-10-CM | POA: Diagnosis not present

## 2022-07-03 DIAGNOSIS — F329 Major depressive disorder, single episode, unspecified: Secondary | ICD-10-CM | POA: Diagnosis not present

## 2022-07-03 DIAGNOSIS — E034 Atrophy of thyroid (acquired): Secondary | ICD-10-CM | POA: Diagnosis not present

## 2022-07-03 DIAGNOSIS — E1151 Type 2 diabetes mellitus with diabetic peripheral angiopathy without gangrene: Secondary | ICD-10-CM | POA: Diagnosis not present

## 2022-07-03 DIAGNOSIS — E1169 Type 2 diabetes mellitus with other specified complication: Secondary | ICD-10-CM | POA: Diagnosis not present

## 2022-07-03 DIAGNOSIS — M4807 Spinal stenosis, lumbosacral region: Secondary | ICD-10-CM | POA: Diagnosis not present

## 2022-07-03 DIAGNOSIS — E785 Hyperlipidemia, unspecified: Secondary | ICD-10-CM | POA: Diagnosis not present

## 2022-07-03 DIAGNOSIS — I872 Venous insufficiency (chronic) (peripheral): Secondary | ICD-10-CM | POA: Diagnosis not present

## 2022-07-03 DIAGNOSIS — E1159 Type 2 diabetes mellitus with other circulatory complications: Secondary | ICD-10-CM | POA: Diagnosis not present

## 2022-07-03 DIAGNOSIS — R1319 Other dysphagia: Secondary | ICD-10-CM | POA: Diagnosis not present

## 2022-07-03 DIAGNOSIS — I890 Lymphedema, not elsewhere classified: Secondary | ICD-10-CM | POA: Diagnosis not present

## 2022-07-03 DIAGNOSIS — F411 Generalized anxiety disorder: Secondary | ICD-10-CM | POA: Diagnosis not present

## 2022-07-03 DIAGNOSIS — Z6841 Body Mass Index (BMI) 40.0 and over, adult: Secondary | ICD-10-CM | POA: Diagnosis not present

## 2022-07-10 ENCOUNTER — Telehealth: Payer: Self-pay

## 2022-07-10 NOTE — Telephone Encounter (Signed)
Noted  

## 2022-07-10 NOTE — Telephone Encounter (Signed)
Copied from CRM [id number]. Topic: General - Inquiry >> Jul 10, 2022  1:18 PM [place] wrote: Reason for CRM: Ledora Bottcher from [facility] Stated she is calling to report pt missed a visit for OT.  Please review.

## 2022-07-16 ENCOUNTER — Encounter: Payer: Self-pay | Admitting: Physician Assistant

## 2022-07-16 ENCOUNTER — Telehealth (INDEPENDENT_AMBULATORY_CARE_PROVIDER_SITE_OTHER): Payer: Medicare Other | Admitting: Physician Assistant

## 2022-07-16 VITALS — Temp 98.1°F

## 2022-07-16 DIAGNOSIS — T3695XA Adverse effect of unspecified systemic antibiotic, initial encounter: Secondary | ICD-10-CM | POA: Diagnosis not present

## 2022-07-16 DIAGNOSIS — B379 Candidiasis, unspecified: Secondary | ICD-10-CM

## 2022-07-16 DIAGNOSIS — J0110 Acute frontal sinusitis, unspecified: Secondary | ICD-10-CM | POA: Diagnosis not present

## 2022-07-16 MED ORDER — DOXYCYCLINE HYCLATE 100 MG PO TABS: 100.0000 mg | ORAL_TABLET | Freq: Two times a day (BID) | ORAL | 0 refills | Status: AC

## 2022-07-16 MED ORDER — AZELASTINE HCL 0.1 % NA SOLN: 1.0000 | Freq: Two times a day (BID) | NASAL | 1 refills | Status: AC

## 2022-07-16 MED ORDER — FLUCONAZOLE 150 MG PO TABS: 150.0000 mg | ORAL_TABLET | Freq: Once | ORAL | 0 refills | Status: AC

## 2022-07-16 NOTE — Progress Notes (Signed)
I,Sha'taria Tyson,acting as a Neurosurgeon for [employer], PA-C.,have documented all relevant documentation on the behalf of Alfredia Ferguson, PA-C,as directed by  Alfredia Ferguson, PA-C while in the presence of Alfredia Ferguson, PA-C.   MyChart Audio Visit    Virtual Visit via Audio Note   This format is felt to be most appropriate for this patient at this time. Physical exam was limited by quality of the video and audio technology used for the visit.  Visit was initially a video visit but d/t technically issues transferred to phone visit.  Patient location: Home Provider location: [facility]  I discussed the limitations of evaluation and management by telemedicine and the availability of in person appointments. The patient expressed understanding and agreed to proceed.  Patient: Christina Rubio   DOB: 11-May-1950   72 y.o. Female  MRN: [id number] Visit Date: 07/16/2022  Today's healthcare provider: Alfredia Ferguson, PA-C   Cc. Nasal congestion, cough  Subjective    HPI   Upper respiratory symptoms She complains of nasal congestion, post nasal drip, and productive cough with  green colored sputum.with no fever, chills, night sweats or weight loss. Onset of symptoms was  Saturday  and gradually worsening.She is drinking plenty of fluids.  Past history is significant for occasional episodes of bronchitis. Patient is non-smoker. Patient reports she has taken sudafed sinus congestion with minor relief.   ---------------------------------------------------------------------------------------------------  Medications: Outpatient Medications Prior to Visit  Medication Sig   ALPRAZolam (XANAX) 0.5 MG tablet TAKE 1 TABLET BY MOUTH EVERY DAY AS NEEDED FOR ANXIETY.   atorvastatin (LIPITOR) 20 MG tablet TAKE 1 TABLET BY MOUTH DAILY   blood glucose meter kit and supplies Dispense based on patient and insurance preference. Use once daily to check for fasting blood sugars. (FOR ICD-10  E10.9, E11.9).   Glucose Blood (BLOOD GLUCOSE TEST STRIPS 333) STRP Dispense based on patient and insurance preference. Use once daily to check for fasting blood sugars. (ICD-10 E11.69)   levothyroxine (SYNTHROID) 112 MCG tablet TAKE 1 TABLET EVERY DAY ON EMPTY STOMACHWITH A GLASS OF WATER AT LEAST 30-60 MINBEFORE BREAKFAST   loperamide (IMODIUM A-D) 2 MG tablet Take 1 tablet (2 mg total) by mouth 4 (four) times daily as needed for diarrhea or loose stools.   metoprolol succinate (TOPROL-XL) 50 MG 24 hr tablet TAKE 1 TABLET BY MOUTH DAILY WITH OR IMMEDIATELY FOLLOWING A MEAL   metroNIDAZOLE (FLAGYL) 500 MG tablet Take 1 tablet (500 mg total) by mouth 2 (two) times daily.   Multiple Vitamins-Minerals (PRESERVISION AREDS 2) CAPS Take 1 tablet by mouth 2 (two) times daily.   Probiotic Product (PROBIOTIC PO) Take by mouth daily.   sertraline (ZOLOFT) 100 MG tablet TAKE 2 TABLETS BY MOUTH DAILY WITH BREAKFAST   valsartan-hydrochlorothiazide (DIOVAN-HCT) 160-25 MG tablet TAKE 1 TABLET BY MOUTH NIGHTLY   triamcinolone ointment (KENALOG) 0.5 % APPLY TO AFFECTED AREAS TOPICALLY TWICE DAILY   No facility-administered medications prior to visit.    Review of Systems  Constitutional:  Positive for fatigue. Negative for fever.  HENT:  Positive for congestion, rhinorrhea, sinus pressure and sinus pain.   Respiratory:  Positive for cough. Negative for shortness of breath.   Cardiovascular:  Negative for chest pain and leg swelling.  Gastrointestinal:  Negative for abdominal pain.  Neurological:  Negative for dizziness and headaches.     Objective    Temp 98.1 F (36.7 C) (Temporal)   Physical Exam Neurological:     Mental Status: She is alert  and oriented to person, place, and time.  Psychiatric:        Mood and Affect: Mood normal.        Behavior: Behavior normal.        Assessment & Plan     1. Acute non-recurrent frontal sinusitis Fluids, rx azelastine and doxycycline d/t pcn  allergy.  - doxycycline (VIBRA-TABS) 100 MG tablet; Take 1 tablet (100 mg total) by mouth 2 (two) times daily for 7 days.  Dispense: 14 tablet; Refill: 0 - azelastine (ASTELIN) 0.1 % nasal spray; Place 1 spray into both nostrils 2 (two) times daily. Use in each nostril as directed  Dispense: 30 mL; Refill: 1  2. Antibiotic-induced yeast infection Advised in case of symptoms  - fluconazole (DIFLUCAN) 150 MG tablet; Take 1 tablet (150 mg total) by mouth once for 1 dose. For antibiotic induced yeast infection  Dispense: 1 tablet; Refill: 0   Return if symptoms worsen or fail to improve.     I discussed the assessment and treatment plan with the patient. The patient was provided an opportunity to ask questions and all were answered. The patient agreed with the plan and demonstrated an understanding of the instructions.   The patient was advised to call back or seek an in-person evaluation if the symptoms worsen or if the condition fails to improve as anticipated.  I provided 6 minutes of non-face-to-face time during this encounter.  I, Alfredia Ferguson, PA-C have reviewed all documentation for this visit. The documentation on  07/16/22   for the exam, diagnosis, procedures, and orders are all accurate and complete.   [facility]

## 2022-07-18 ENCOUNTER — Telehealth: Payer: Self-pay

## 2022-07-18 DIAGNOSIS — F329 Major depressive disorder, single episode, unspecified: Secondary | ICD-10-CM | POA: Diagnosis not present

## 2022-07-18 DIAGNOSIS — M170 Bilateral primary osteoarthritis of knee: Secondary | ICD-10-CM | POA: Diagnosis not present

## 2022-07-18 DIAGNOSIS — G8929 Other chronic pain: Secondary | ICD-10-CM | POA: Diagnosis not present

## 2022-07-18 DIAGNOSIS — F411 Generalized anxiety disorder: Secondary | ICD-10-CM | POA: Diagnosis not present

## 2022-07-18 DIAGNOSIS — I872 Venous insufficiency (chronic) (peripheral): Secondary | ICD-10-CM | POA: Diagnosis not present

## 2022-07-18 DIAGNOSIS — E034 Atrophy of thyroid (acquired): Secondary | ICD-10-CM | POA: Diagnosis not present

## 2022-07-18 DIAGNOSIS — E785 Hyperlipidemia, unspecified: Secondary | ICD-10-CM | POA: Diagnosis not present

## 2022-07-18 DIAGNOSIS — I890 Lymphedema, not elsewhere classified: Secondary | ICD-10-CM | POA: Diagnosis not present

## 2022-07-18 DIAGNOSIS — R1319 Other dysphagia: Secondary | ICD-10-CM | POA: Diagnosis not present

## 2022-07-18 DIAGNOSIS — E1169 Type 2 diabetes mellitus with other specified complication: Secondary | ICD-10-CM | POA: Diagnosis not present

## 2022-07-18 DIAGNOSIS — M4807 Spinal stenosis, lumbosacral region: Secondary | ICD-10-CM | POA: Diagnosis not present

## 2022-07-18 DIAGNOSIS — E1151 Type 2 diabetes mellitus with diabetic peripheral angiopathy without gangrene: Secondary | ICD-10-CM | POA: Diagnosis not present

## 2022-07-18 DIAGNOSIS — M479 Spondylosis, unspecified: Secondary | ICD-10-CM | POA: Diagnosis not present

## 2022-07-18 DIAGNOSIS — E1159 Type 2 diabetes mellitus with other circulatory complications: Secondary | ICD-10-CM | POA: Diagnosis not present

## 2022-07-18 DIAGNOSIS — I152 Hypertension secondary to endocrine disorders: Secondary | ICD-10-CM | POA: Diagnosis not present

## 2022-07-18 NOTE — Telephone Encounter (Signed)
FYI

## 2022-07-18 NOTE — Telephone Encounter (Signed)
Copied from CRM [id number]. Topic: General - Other >> Jul 18, 2022  8:41 AM Clide Dales wrote: Efraim Kaufmann with [facility] called to report a missed OT visit with patient on 4/26.Melissa stated that patient's daughter is in critical condition and patient does not feel up to doing her therapy at this time. Please reach out to [facility] with any additional questions.

## 2022-07-18 NOTE — Telephone Encounter (Signed)
Noted  

## 2022-07-21 ENCOUNTER — Other Ambulatory Visit: Payer: Self-pay | Admitting: Family Medicine

## 2022-07-21 NOTE — Telephone Encounter (Signed)
Medication Refill - Medication: ALPRAZolam (XANAX) 0.5 MG tablet   Patient called back as she gave the wrong pharmacy earlier: Has the patient contacted their pharmacy? No. Patient did not contact the pharmacy as she is not in [place] she is in [place] because they are taking her daughter off life support today which is why she need the refill   Preferred Pharmacy (with phone number or street name):  [id number] - [place], [place] - [address] AT [address] OF [address] [facility] Phone: [phone]  Fax: [phone]      Has the patient been seen for an appointment in the last year OR does the patient have an upcoming appointment? No.   Agent: Please be advised that RX refills may take up to 3 business days. We ask that you follow-up with your pharmacy.

## 2022-07-21 NOTE — Telephone Encounter (Signed)
Medication Refill - Medication: ALPRAZolam (XANAX) 0.5 MG tablet   Has the patient contacted their pharmacy? No. Patient did not contact the pharmacy as she is not in [place] she is in [place] because they are taking her daughter off life support today which is why she need the refill  Preferred Pharmacy (with phone number or street name):  [id number] - [place], [place] - [address] AT [facility] [address] [place] Phone: [phone]  Fax: [phone]     Has the patient been seen for an appointment in the last year OR does the patient have an upcoming appointment? No.  Agent: Please be advised that RX refills may take up to 3 business days. We ask that you follow-up with your pharmacy.

## 2022-07-22 NOTE — Telephone Encounter (Signed)
Requested medication (s) are due for refill today: na  Requested medication (s) are on the active medication list: yes  Last refill:  03/14/22 #15 0 refills  Future visit scheduled: no   Notes to clinic:  not delegated per protocol. Patient called back as she gave the wrong pharmacy earlier: Has the patient contacted their pharmacy? No. Patient did not contact the pharmacy as she is not in [place] she is in [place] because they are taking her daughter off life support today which is why she need the refill  Please advise      Requested Prescriptions  Pending Prescriptions Disp Refills   ALPRAZolam (XANAX) 0.5 MG tablet 15 tablet 0    Sig: TAKE 1 TABLET BY MOUTH EVERY DAY AS NEEDED FOR ANXIETY.     Not Delegated - Psychiatry: Anxiolytics/Hypnotics 2 Failed - 07/21/2022 12:08 PM      Failed - This refill cannot be delegated      Failed - Urine Drug Screen completed in last 360 days      Passed - Patient is not pregnant      Passed - Valid encounter within last 6 months    Recent Outpatient Visits           6 days ago Acute non-recurrent frontal sinusitis   [facility] Alfredia Ferguson, PA-C   2 months ago Acute vaginitis   [facility] [place], Marzella Schlein, MD   4 months ago Osteoarthritis of multiple joints, unspecified osteoarthritis type   [facility] [place], Marzella Schlein, MD   8 months ago COVID-19   [facility] [place], [place], PA-C   8 months ago COVID-19   [facility] Sherrie Mustache, Demetrios Isaacs, MD

## 2022-07-24 ENCOUNTER — Ambulatory Visit: Payer: Self-pay

## 2022-07-24 ENCOUNTER — Telehealth: Payer: Self-pay | Admitting: Family Medicine

## 2022-07-24 MED ORDER — ALPRAZOLAM 0.5 MG PO TABS: ORAL_TABLET | ORAL | 0 refills | Status: DC

## 2022-07-24 NOTE — Telephone Encounter (Signed)
Copied from CRM [id number]. Topic: General - Other >> Jul 24, 2022  2:35 PM Everette C wrote: Reason for CRM: The patient has called to follow up on their previous refill request of ALPRAZolam (XANAX) 0.5 MG tablet [[id number]]  Please contact further when possible

## 2022-07-24 NOTE — Telephone Encounter (Signed)
The patient shares that they have recently lost the antibiotic for their sinus discomfort   The patient shares that they had 6 doses remaining, and would like to continue their medication to improve   The patient shares that they are continuing to experience congestion and sinus discomfort   Please contact further when possible     Chief Complaint: Pt. Lost her Doxycycline, needs 6 doses. Had gone out of town, her daughter has died. Also needs refill of Alprazolam.  Symptoms: n/a Frequency: n/a Pertinent Negatives: Patient denies  Disposition: [] ED /[] Urgent Care (no appt availability in office) / [] Appointment(In office/virtual)/ []  [facility]/ [] Home Care/ [] Refused Recommended Disposition /[] [facility]/ [x]  Follow-up with PCP Additional Notes: Please advise   Answer Assessment - Initial Assessment Questions 1. DRUG NAME: "What medicine do you need to have refilled?"     Doxycyline 2. REFILLS REMAINING: "How many refills are remaining?" (Note: The label on the medicine or pill bottle will show how many refills are remaining. If there are no refills remaining, then a renewal may be needed.)     N/a 3. EXPIRATION DATE: "What is the expiration date?" (Note: The label states when the prescription will expire, and thus can no longer be refilled.)     N/a 4. PRESCRIBING HCP: "Who prescribed it?" Reason: If prescribed by specialist, call should be referred to that group.      5. SYMPTOMS: "Do you have any symptoms?"     N/a 6. PREGNANCY: "Is there any chance that you are pregnant?" "When was your last menstrual period?"     No  Protocols used: Medication Refill and Renewal Call-A-AH

## 2022-07-24 NOTE — Telephone Encounter (Signed)
Please Review

## 2022-07-24 NOTE — Telephone Encounter (Signed)
Routed refill request to office on 07/22/22, patient calling for update.

## 2022-07-25 ENCOUNTER — Telehealth: Payer: Self-pay

## 2022-07-25 DIAGNOSIS — R1319 Other dysphagia: Secondary | ICD-10-CM | POA: Diagnosis not present

## 2022-07-25 DIAGNOSIS — I872 Venous insufficiency (chronic) (peripheral): Secondary | ICD-10-CM | POA: Diagnosis not present

## 2022-07-25 DIAGNOSIS — G8929 Other chronic pain: Secondary | ICD-10-CM | POA: Diagnosis not present

## 2022-07-25 DIAGNOSIS — F329 Major depressive disorder, single episode, unspecified: Secondary | ICD-10-CM | POA: Diagnosis not present

## 2022-07-25 DIAGNOSIS — M479 Spondylosis, unspecified: Secondary | ICD-10-CM | POA: Diagnosis not present

## 2022-07-25 DIAGNOSIS — M170 Bilateral primary osteoarthritis of knee: Secondary | ICD-10-CM | POA: Diagnosis not present

## 2022-07-25 DIAGNOSIS — E1151 Type 2 diabetes mellitus with diabetic peripheral angiopathy without gangrene: Secondary | ICD-10-CM | POA: Diagnosis not present

## 2022-07-25 DIAGNOSIS — E1159 Type 2 diabetes mellitus with other circulatory complications: Secondary | ICD-10-CM | POA: Diagnosis not present

## 2022-07-25 DIAGNOSIS — F411 Generalized anxiety disorder: Secondary | ICD-10-CM | POA: Diagnosis not present

## 2022-07-25 DIAGNOSIS — E785 Hyperlipidemia, unspecified: Secondary | ICD-10-CM | POA: Diagnosis not present

## 2022-07-25 DIAGNOSIS — M4807 Spinal stenosis, lumbosacral region: Secondary | ICD-10-CM | POA: Diagnosis not present

## 2022-07-25 DIAGNOSIS — E034 Atrophy of thyroid (acquired): Secondary | ICD-10-CM | POA: Diagnosis not present

## 2022-07-25 DIAGNOSIS — I890 Lymphedema, not elsewhere classified: Secondary | ICD-10-CM | POA: Diagnosis not present

## 2022-07-25 DIAGNOSIS — E1169 Type 2 diabetes mellitus with other specified complication: Secondary | ICD-10-CM | POA: Diagnosis not present

## 2022-07-25 DIAGNOSIS — I152 Hypertension secondary to endocrine disorders: Secondary | ICD-10-CM | POA: Diagnosis not present

## 2022-07-25 MED ORDER — DOXYCYCLINE HYCLATE 100 MG PO TABS: 100.0000 mg | ORAL_TABLET | Freq: Two times a day (BID) | ORAL | 0 refills | Status: DC

## 2022-07-25 NOTE — Telephone Encounter (Signed)
Noted  

## 2022-07-25 NOTE — Telephone Encounter (Signed)
Copied from CRM [id number]. Topic: General - Other >> Jul 25, 2022  9:02 AM Turkey B wrote: Reason for CRM: Melisssa from [facility], says pt missed OT visit yesterday.

## 2022-07-25 NOTE — Addendum Note (Signed)
Addended by: Hyacinth Meeker on: 07/25/2022 10:12 AM   Modules accepted: Orders

## 2022-07-25 NOTE — Telephone Encounter (Signed)
Ok to refill Doxycyline 100mg  BID x7d from Lindsey's visit last week

## 2022-07-29 DIAGNOSIS — R1319 Other dysphagia: Secondary | ICD-10-CM | POA: Diagnosis not present

## 2022-07-29 DIAGNOSIS — G8929 Other chronic pain: Secondary | ICD-10-CM | POA: Diagnosis not present

## 2022-07-29 DIAGNOSIS — E785 Hyperlipidemia, unspecified: Secondary | ICD-10-CM | POA: Diagnosis not present

## 2022-07-29 DIAGNOSIS — E1169 Type 2 diabetes mellitus with other specified complication: Secondary | ICD-10-CM | POA: Diagnosis not present

## 2022-07-29 DIAGNOSIS — M170 Bilateral primary osteoarthritis of knee: Secondary | ICD-10-CM | POA: Diagnosis not present

## 2022-07-29 DIAGNOSIS — I890 Lymphedema, not elsewhere classified: Secondary | ICD-10-CM | POA: Diagnosis not present

## 2022-07-29 DIAGNOSIS — E034 Atrophy of thyroid (acquired): Secondary | ICD-10-CM | POA: Diagnosis not present

## 2022-07-29 DIAGNOSIS — M4807 Spinal stenosis, lumbosacral region: Secondary | ICD-10-CM | POA: Diagnosis not present

## 2022-07-29 DIAGNOSIS — F411 Generalized anxiety disorder: Secondary | ICD-10-CM | POA: Diagnosis not present

## 2022-07-29 DIAGNOSIS — I872 Venous insufficiency (chronic) (peripheral): Secondary | ICD-10-CM | POA: Diagnosis not present

## 2022-07-29 DIAGNOSIS — E1159 Type 2 diabetes mellitus with other circulatory complications: Secondary | ICD-10-CM | POA: Diagnosis not present

## 2022-07-29 DIAGNOSIS — M479 Spondylosis, unspecified: Secondary | ICD-10-CM | POA: Diagnosis not present

## 2022-07-29 DIAGNOSIS — F329 Major depressive disorder, single episode, unspecified: Secondary | ICD-10-CM | POA: Diagnosis not present

## 2022-07-29 DIAGNOSIS — E1151 Type 2 diabetes mellitus with diabetic peripheral angiopathy without gangrene: Secondary | ICD-10-CM | POA: Diagnosis not present

## 2022-07-29 DIAGNOSIS — I152 Hypertension secondary to endocrine disorders: Secondary | ICD-10-CM | POA: Diagnosis not present

## 2022-07-30 ENCOUNTER — Telehealth: Payer: Self-pay

## 2022-07-30 DIAGNOSIS — E034 Atrophy of thyroid (acquired): Secondary | ICD-10-CM | POA: Diagnosis not present

## 2022-07-30 DIAGNOSIS — E785 Hyperlipidemia, unspecified: Secondary | ICD-10-CM | POA: Diagnosis not present

## 2022-07-30 DIAGNOSIS — I890 Lymphedema, not elsewhere classified: Secondary | ICD-10-CM | POA: Diagnosis not present

## 2022-07-30 DIAGNOSIS — E1151 Type 2 diabetes mellitus with diabetic peripheral angiopathy without gangrene: Secondary | ICD-10-CM | POA: Diagnosis not present

## 2022-07-30 DIAGNOSIS — M479 Spondylosis, unspecified: Secondary | ICD-10-CM | POA: Diagnosis not present

## 2022-07-30 DIAGNOSIS — M4807 Spinal stenosis, lumbosacral region: Secondary | ICD-10-CM | POA: Diagnosis not present

## 2022-07-30 DIAGNOSIS — F329 Major depressive disorder, single episode, unspecified: Secondary | ICD-10-CM | POA: Diagnosis not present

## 2022-07-30 DIAGNOSIS — E1159 Type 2 diabetes mellitus with other circulatory complications: Secondary | ICD-10-CM | POA: Diagnosis not present

## 2022-07-30 DIAGNOSIS — F411 Generalized anxiety disorder: Secondary | ICD-10-CM | POA: Diagnosis not present

## 2022-07-30 DIAGNOSIS — G8929 Other chronic pain: Secondary | ICD-10-CM | POA: Diagnosis not present

## 2022-07-30 DIAGNOSIS — R1319 Other dysphagia: Secondary | ICD-10-CM | POA: Diagnosis not present

## 2022-07-30 DIAGNOSIS — E1169 Type 2 diabetes mellitus with other specified complication: Secondary | ICD-10-CM | POA: Diagnosis not present

## 2022-07-30 DIAGNOSIS — I152 Hypertension secondary to endocrine disorders: Secondary | ICD-10-CM | POA: Diagnosis not present

## 2022-07-30 DIAGNOSIS — M170 Bilateral primary osteoarthritis of knee: Secondary | ICD-10-CM | POA: Diagnosis not present

## 2022-07-30 DIAGNOSIS — I872 Venous insufficiency (chronic) (peripheral): Secondary | ICD-10-CM | POA: Diagnosis not present

## 2022-07-30 NOTE — Telephone Encounter (Signed)
Copied from CRM [id number]. Topic: Quick Communication - Home Health Verbal Orders >> Jul 29, 2022  3:51 PM Everette C wrote: Caller/Agency: Shireen / [facility] Number: [phone] Requesting OT/PT/Skilled Nursing/Social Work/Speech Therapy: OT Frequency: 1 x every other week, 8 weeks

## 2022-07-31 ENCOUNTER — Other Ambulatory Visit: Payer: Self-pay | Admitting: Family Medicine

## 2022-07-31 NOTE — Telephone Encounter (Signed)
Requested medication (s) are due for refill today: yes  Requested medication (s) are on the active medication list: yes  Last refill:  07/24/22  Future visit scheduled:no  Notes to clinic:  Unable to refill per protocol, cannot delegate.      Requested Prescriptions  Pending Prescriptions Disp Refills   ALPRAZolam (XANAX) 0.5 MG tablet 15 tablet 0    Sig: TAKE 1 TABLET BY MOUTH EVERY DAY AS NEEDED FOR ANXIETY.     Not Delegated - Psychiatry: Anxiolytics/Hypnotics 2 Failed - 07/31/2022 10:44 AM      Failed - This refill cannot be delegated      Failed - Urine Drug Screen completed in last 360 days      Passed - Patient is not pregnant      Passed - Valid encounter within last 6 months    Recent Outpatient Visits           2 weeks ago Acute non-recurrent frontal sinusitis   [place] [facility] Alfredia Ferguson, PA-C   2 months ago Acute vaginitis    [facility] [place], Marzella Schlein, MD   4 months ago Osteoarthritis of multiple joints, unspecified osteoarthritis type   [facility] [place], Marzella Schlein, MD   8 months ago COVID-19   [facility] [place], [place], PA-C   9 months ago COVID-19   [facility] Sherrie Mustache, Demetrios Isaacs, MD

## 2022-07-31 NOTE — Telephone Encounter (Signed)
Pt requests that the Rx for ALPRAZolam Prudy Feeler) 0.5 MG tablet be transferred to  [facility] - [place], [place] - [address] Phone: [phone]  Fax: [phone]     Pt requests to be contacted once the Rx is sent to [facility] because she needs it before going to her daughters funeral tomorrow.

## 2022-07-31 NOTE — Telephone Encounter (Signed)
Requested medication (s) are due for refill today:   Written 07/24/2022 by Robynn Pane  Requested medication (s) are on the active medication list:   Yes  Future visit scheduled:   AWV  02/2023   Last ordered: 07/24/2022 #15, 0 refills  Returned because pt is wanting this transferred to a different pharmacy preferably today because she needs it for her mother's  funeral tomorrow.   See message.   Wants it transferred.  Requested Prescriptions  Pending Prescriptions Disp Refills   ALPRAZolam (XANAX) 0.5 MG tablet 15 tablet 0    Sig: TAKE 1 TABLET BY MOUTH EVERY DAY AS NEEDED FOR ANXIETY.     Not Delegated - Psychiatry: Anxiolytics/Hypnotics 2 Failed - 07/31/2022 10:44 AM      Failed - This refill cannot be delegated      Failed - Urine Drug Screen completed in last 360 days      Passed - Patient is not pregnant      Passed - Valid encounter within last 6 months    Recent Outpatient Visits           2 weeks ago Acute non-recurrent frontal sinusitis   [facility] Alfredia Ferguson, PA-C   2 months ago Acute vaginitis   [facility] [place], Marzella Schlein, MD   4 months ago Osteoarthritis of multiple joints, unspecified osteoarthritis type   [facility] [place], Marzella Schlein, MD   8 months ago COVID-19   [facility] [place], [place], PA-C   9 months ago COVID-19   [facility] Sherrie Mustache, Demetrios Isaacs, MD

## 2022-08-01 ENCOUNTER — Other Ambulatory Visit: Payer: Self-pay | Admitting: Family Medicine

## 2022-08-01 ENCOUNTER — Other Ambulatory Visit: Payer: Self-pay

## 2022-08-01 NOTE — Telephone Encounter (Signed)
Copied from CRM [id number]. Topic: General - Other >> Aug 01, 2022 10:19 AM Dondra Prader A wrote: Reason for CRM: Pt is calling in upset because her medication has not been sent into [facility]. Per pt her daughter died and she is needing her medication today. She is having to leave to go out of town for the funeral. Please advise.  ALPRAZolam Prudy Feeler) 0.5 MG tablet [facility] - [place], [place] - [id number]  Phone: [phone] Fax: [phone]

## 2022-08-02 DIAGNOSIS — F411 Generalized anxiety disorder: Secondary | ICD-10-CM | POA: Diagnosis not present

## 2022-08-02 DIAGNOSIS — M170 Bilateral primary osteoarthritis of knee: Secondary | ICD-10-CM | POA: Diagnosis not present

## 2022-08-02 DIAGNOSIS — E785 Hyperlipidemia, unspecified: Secondary | ICD-10-CM | POA: Diagnosis not present

## 2022-08-02 DIAGNOSIS — I890 Lymphedema, not elsewhere classified: Secondary | ICD-10-CM | POA: Diagnosis not present

## 2022-08-02 DIAGNOSIS — G8929 Other chronic pain: Secondary | ICD-10-CM | POA: Diagnosis not present

## 2022-08-02 DIAGNOSIS — I152 Hypertension secondary to endocrine disorders: Secondary | ICD-10-CM | POA: Diagnosis not present

## 2022-08-02 DIAGNOSIS — R1319 Other dysphagia: Secondary | ICD-10-CM | POA: Diagnosis not present

## 2022-08-02 DIAGNOSIS — M4807 Spinal stenosis, lumbosacral region: Secondary | ICD-10-CM | POA: Diagnosis not present

## 2022-08-02 DIAGNOSIS — E1169 Type 2 diabetes mellitus with other specified complication: Secondary | ICD-10-CM | POA: Diagnosis not present

## 2022-08-02 DIAGNOSIS — F329 Major depressive disorder, single episode, unspecified: Secondary | ICD-10-CM | POA: Diagnosis not present

## 2022-08-02 DIAGNOSIS — Z6841 Body Mass Index (BMI) 40.0 and over, adult: Secondary | ICD-10-CM | POA: Diagnosis not present

## 2022-08-02 DIAGNOSIS — E1151 Type 2 diabetes mellitus with diabetic peripheral angiopathy without gangrene: Secondary | ICD-10-CM | POA: Diagnosis not present

## 2022-08-02 DIAGNOSIS — E1159 Type 2 diabetes mellitus with other circulatory complications: Secondary | ICD-10-CM | POA: Diagnosis not present

## 2022-08-02 DIAGNOSIS — I872 Venous insufficiency (chronic) (peripheral): Secondary | ICD-10-CM | POA: Diagnosis not present

## 2022-08-02 DIAGNOSIS — E034 Atrophy of thyroid (acquired): Secondary | ICD-10-CM | POA: Diagnosis not present

## 2022-08-02 DIAGNOSIS — M479 Spondylosis, unspecified: Secondary | ICD-10-CM | POA: Diagnosis not present

## 2022-08-02 DIAGNOSIS — Z9181 History of falling: Secondary | ICD-10-CM | POA: Diagnosis not present

## 2022-08-04 ENCOUNTER — Telehealth: Payer: Self-pay | Admitting: Family Medicine

## 2022-08-04 ENCOUNTER — Other Ambulatory Visit: Payer: Self-pay | Admitting: Family Medicine

## 2022-08-04 MED ORDER — ALPRAZOLAM 0.5 MG PO TABS: ORAL_TABLET | ORAL | 0 refills | Status: DC

## 2022-08-04 NOTE — Telephone Encounter (Signed)
OK for verbals 

## 2022-08-04 NOTE — Telephone Encounter (Signed)
Home Health Verbal Orders - Caller/Agency: [facility]  Callback Number: [phone]  Requesting OT/PT/Skilled Nursing/Social Work/Speech Therapy: PT  Frequency: 1 w 9

## 2022-08-05 NOTE — Telephone Encounter (Addendum)
Shireen from [facility] calling to f/u on verbal order stated has not heard back.  Please advise.

## 2022-08-05 NOTE — Telephone Encounter (Signed)
Left detailed message with approved verbal order.

## 2022-08-05 NOTE — Telephone Encounter (Signed)
Advised 

## 2022-08-05 NOTE — Telephone Encounter (Signed)
OK for verbals 

## 2022-08-11 DIAGNOSIS — I872 Venous insufficiency (chronic) (peripheral): Secondary | ICD-10-CM

## 2022-08-11 DIAGNOSIS — G8929 Other chronic pain: Secondary | ICD-10-CM

## 2022-08-11 DIAGNOSIS — M4807 Spinal stenosis, lumbosacral region: Secondary | ICD-10-CM

## 2022-08-11 DIAGNOSIS — E1151 Type 2 diabetes mellitus with diabetic peripheral angiopathy without gangrene: Secondary | ICD-10-CM

## 2022-08-11 DIAGNOSIS — M170 Bilateral primary osteoarthritis of knee: Secondary | ICD-10-CM

## 2022-08-11 DIAGNOSIS — M479 Spondylosis, unspecified: Secondary | ICD-10-CM

## 2022-08-11 DIAGNOSIS — F329 Major depressive disorder, single episode, unspecified: Secondary | ICD-10-CM

## 2022-08-11 DIAGNOSIS — I152 Hypertension secondary to endocrine disorders: Secondary | ICD-10-CM

## 2022-08-11 DIAGNOSIS — E034 Atrophy of thyroid (acquired): Secondary | ICD-10-CM

## 2022-08-11 DIAGNOSIS — I890 Lymphedema, not elsewhere classified: Secondary | ICD-10-CM

## 2022-08-11 DIAGNOSIS — E1159 Type 2 diabetes mellitus with other circulatory complications: Secondary | ICD-10-CM

## 2022-08-11 DIAGNOSIS — F411 Generalized anxiety disorder: Secondary | ICD-10-CM

## 2022-08-12 DIAGNOSIS — E785 Hyperlipidemia, unspecified: Secondary | ICD-10-CM | POA: Diagnosis not present

## 2022-08-12 DIAGNOSIS — M170 Bilateral primary osteoarthritis of knee: Secondary | ICD-10-CM | POA: Diagnosis not present

## 2022-08-12 DIAGNOSIS — E1151 Type 2 diabetes mellitus with diabetic peripheral angiopathy without gangrene: Secondary | ICD-10-CM | POA: Diagnosis not present

## 2022-08-12 DIAGNOSIS — I152 Hypertension secondary to endocrine disorders: Secondary | ICD-10-CM | POA: Diagnosis not present

## 2022-08-12 DIAGNOSIS — M479 Spondylosis, unspecified: Secondary | ICD-10-CM | POA: Diagnosis not present

## 2022-08-12 DIAGNOSIS — I872 Venous insufficiency (chronic) (peripheral): Secondary | ICD-10-CM | POA: Diagnosis not present

## 2022-08-12 DIAGNOSIS — R1319 Other dysphagia: Secondary | ICD-10-CM | POA: Diagnosis not present

## 2022-08-12 DIAGNOSIS — E1169 Type 2 diabetes mellitus with other specified complication: Secondary | ICD-10-CM | POA: Diagnosis not present

## 2022-08-12 DIAGNOSIS — E034 Atrophy of thyroid (acquired): Secondary | ICD-10-CM | POA: Diagnosis not present

## 2022-08-12 DIAGNOSIS — F329 Major depressive disorder, single episode, unspecified: Secondary | ICD-10-CM | POA: Diagnosis not present

## 2022-08-12 DIAGNOSIS — M4807 Spinal stenosis, lumbosacral region: Secondary | ICD-10-CM | POA: Diagnosis not present

## 2022-08-12 DIAGNOSIS — G8929 Other chronic pain: Secondary | ICD-10-CM | POA: Diagnosis not present

## 2022-08-12 DIAGNOSIS — E1159 Type 2 diabetes mellitus with other circulatory complications: Secondary | ICD-10-CM | POA: Diagnosis not present

## 2022-08-12 DIAGNOSIS — F411 Generalized anxiety disorder: Secondary | ICD-10-CM | POA: Diagnosis not present

## 2022-08-12 DIAGNOSIS — I890 Lymphedema, not elsewhere classified: Secondary | ICD-10-CM | POA: Diagnosis not present

## 2022-08-14 DIAGNOSIS — M170 Bilateral primary osteoarthritis of knee: Secondary | ICD-10-CM | POA: Diagnosis not present

## 2022-08-14 DIAGNOSIS — M1711 Unilateral primary osteoarthritis, right knee: Secondary | ICD-10-CM | POA: Diagnosis not present

## 2022-08-14 DIAGNOSIS — M1712 Unilateral primary osteoarthritis, left knee: Secondary | ICD-10-CM | POA: Diagnosis not present

## 2022-08-15 DIAGNOSIS — E1151 Type 2 diabetes mellitus with diabetic peripheral angiopathy without gangrene: Secondary | ICD-10-CM | POA: Diagnosis not present

## 2022-08-15 DIAGNOSIS — E785 Hyperlipidemia, unspecified: Secondary | ICD-10-CM | POA: Diagnosis not present

## 2022-08-15 DIAGNOSIS — M4807 Spinal stenosis, lumbosacral region: Secondary | ICD-10-CM | POA: Diagnosis not present

## 2022-08-15 DIAGNOSIS — R1319 Other dysphagia: Secondary | ICD-10-CM | POA: Diagnosis not present

## 2022-08-15 DIAGNOSIS — I872 Venous insufficiency (chronic) (peripheral): Secondary | ICD-10-CM | POA: Diagnosis not present

## 2022-08-15 DIAGNOSIS — G8929 Other chronic pain: Secondary | ICD-10-CM | POA: Diagnosis not present

## 2022-08-15 DIAGNOSIS — E1169 Type 2 diabetes mellitus with other specified complication: Secondary | ICD-10-CM | POA: Diagnosis not present

## 2022-08-15 DIAGNOSIS — F329 Major depressive disorder, single episode, unspecified: Secondary | ICD-10-CM | POA: Diagnosis not present

## 2022-08-15 DIAGNOSIS — E034 Atrophy of thyroid (acquired): Secondary | ICD-10-CM | POA: Diagnosis not present

## 2022-08-15 DIAGNOSIS — M479 Spondylosis, unspecified: Secondary | ICD-10-CM | POA: Diagnosis not present

## 2022-08-15 DIAGNOSIS — I890 Lymphedema, not elsewhere classified: Secondary | ICD-10-CM | POA: Diagnosis not present

## 2022-08-15 DIAGNOSIS — F411 Generalized anxiety disorder: Secondary | ICD-10-CM | POA: Diagnosis not present

## 2022-08-15 DIAGNOSIS — I152 Hypertension secondary to endocrine disorders: Secondary | ICD-10-CM | POA: Diagnosis not present

## 2022-08-15 DIAGNOSIS — E1159 Type 2 diabetes mellitus with other circulatory complications: Secondary | ICD-10-CM | POA: Diagnosis not present

## 2022-08-15 DIAGNOSIS — M170 Bilateral primary osteoarthritis of knee: Secondary | ICD-10-CM | POA: Diagnosis not present

## 2022-08-20 DIAGNOSIS — I872 Venous insufficiency (chronic) (peripheral): Secondary | ICD-10-CM | POA: Diagnosis not present

## 2022-08-20 DIAGNOSIS — E1151 Type 2 diabetes mellitus with diabetic peripheral angiopathy without gangrene: Secondary | ICD-10-CM | POA: Diagnosis not present

## 2022-08-20 DIAGNOSIS — E1169 Type 2 diabetes mellitus with other specified complication: Secondary | ICD-10-CM | POA: Diagnosis not present

## 2022-08-20 DIAGNOSIS — I890 Lymphedema, not elsewhere classified: Secondary | ICD-10-CM | POA: Diagnosis not present

## 2022-08-20 DIAGNOSIS — M4807 Spinal stenosis, lumbosacral region: Secondary | ICD-10-CM | POA: Diagnosis not present

## 2022-08-20 DIAGNOSIS — I152 Hypertension secondary to endocrine disorders: Secondary | ICD-10-CM | POA: Diagnosis not present

## 2022-08-20 DIAGNOSIS — E785 Hyperlipidemia, unspecified: Secondary | ICD-10-CM | POA: Diagnosis not present

## 2022-08-20 DIAGNOSIS — E1159 Type 2 diabetes mellitus with other circulatory complications: Secondary | ICD-10-CM | POA: Diagnosis not present

## 2022-08-20 DIAGNOSIS — R1319 Other dysphagia: Secondary | ICD-10-CM | POA: Diagnosis not present

## 2022-08-20 DIAGNOSIS — F329 Major depressive disorder, single episode, unspecified: Secondary | ICD-10-CM | POA: Diagnosis not present

## 2022-08-20 DIAGNOSIS — M170 Bilateral primary osteoarthritis of knee: Secondary | ICD-10-CM | POA: Diagnosis not present

## 2022-08-20 DIAGNOSIS — E034 Atrophy of thyroid (acquired): Secondary | ICD-10-CM | POA: Diagnosis not present

## 2022-08-20 DIAGNOSIS — G8929 Other chronic pain: Secondary | ICD-10-CM | POA: Diagnosis not present

## 2022-08-20 DIAGNOSIS — F411 Generalized anxiety disorder: Secondary | ICD-10-CM | POA: Diagnosis not present

## 2022-08-20 DIAGNOSIS — M479 Spondylosis, unspecified: Secondary | ICD-10-CM | POA: Diagnosis not present

## 2022-08-25 DIAGNOSIS — M479 Spondylosis, unspecified: Secondary | ICD-10-CM | POA: Diagnosis not present

## 2022-08-25 DIAGNOSIS — I152 Hypertension secondary to endocrine disorders: Secondary | ICD-10-CM | POA: Diagnosis not present

## 2022-08-25 DIAGNOSIS — F329 Major depressive disorder, single episode, unspecified: Secondary | ICD-10-CM | POA: Diagnosis not present

## 2022-08-25 DIAGNOSIS — M4807 Spinal stenosis, lumbosacral region: Secondary | ICD-10-CM | POA: Diagnosis not present

## 2022-08-25 DIAGNOSIS — I890 Lymphedema, not elsewhere classified: Secondary | ICD-10-CM | POA: Diagnosis not present

## 2022-08-25 DIAGNOSIS — E1151 Type 2 diabetes mellitus with diabetic peripheral angiopathy without gangrene: Secondary | ICD-10-CM | POA: Diagnosis not present

## 2022-08-25 DIAGNOSIS — E034 Atrophy of thyroid (acquired): Secondary | ICD-10-CM | POA: Diagnosis not present

## 2022-08-25 DIAGNOSIS — M170 Bilateral primary osteoarthritis of knee: Secondary | ICD-10-CM | POA: Diagnosis not present

## 2022-08-25 DIAGNOSIS — E785 Hyperlipidemia, unspecified: Secondary | ICD-10-CM | POA: Diagnosis not present

## 2022-08-25 DIAGNOSIS — I872 Venous insufficiency (chronic) (peripheral): Secondary | ICD-10-CM | POA: Diagnosis not present

## 2022-08-25 DIAGNOSIS — F411 Generalized anxiety disorder: Secondary | ICD-10-CM | POA: Diagnosis not present

## 2022-08-25 DIAGNOSIS — E1159 Type 2 diabetes mellitus with other circulatory complications: Secondary | ICD-10-CM | POA: Diagnosis not present

## 2022-08-25 DIAGNOSIS — R1319 Other dysphagia: Secondary | ICD-10-CM | POA: Diagnosis not present

## 2022-08-25 DIAGNOSIS — G8929 Other chronic pain: Secondary | ICD-10-CM | POA: Diagnosis not present

## 2022-08-25 DIAGNOSIS — E1169 Type 2 diabetes mellitus with other specified complication: Secondary | ICD-10-CM | POA: Diagnosis not present

## 2022-08-27 DIAGNOSIS — E785 Hyperlipidemia, unspecified: Secondary | ICD-10-CM | POA: Diagnosis not present

## 2022-08-27 DIAGNOSIS — E1151 Type 2 diabetes mellitus with diabetic peripheral angiopathy without gangrene: Secondary | ICD-10-CM | POA: Diagnosis not present

## 2022-08-27 DIAGNOSIS — E1169 Type 2 diabetes mellitus with other specified complication: Secondary | ICD-10-CM | POA: Diagnosis not present

## 2022-08-27 DIAGNOSIS — G8929 Other chronic pain: Secondary | ICD-10-CM | POA: Diagnosis not present

## 2022-08-27 DIAGNOSIS — M4807 Spinal stenosis, lumbosacral region: Secondary | ICD-10-CM | POA: Diagnosis not present

## 2022-08-27 DIAGNOSIS — M479 Spondylosis, unspecified: Secondary | ICD-10-CM | POA: Diagnosis not present

## 2022-08-27 DIAGNOSIS — E034 Atrophy of thyroid (acquired): Secondary | ICD-10-CM | POA: Diagnosis not present

## 2022-08-27 DIAGNOSIS — R1319 Other dysphagia: Secondary | ICD-10-CM | POA: Diagnosis not present

## 2022-08-27 DIAGNOSIS — F411 Generalized anxiety disorder: Secondary | ICD-10-CM | POA: Diagnosis not present

## 2022-08-27 DIAGNOSIS — M170 Bilateral primary osteoarthritis of knee: Secondary | ICD-10-CM | POA: Diagnosis not present

## 2022-08-27 DIAGNOSIS — E1159 Type 2 diabetes mellitus with other circulatory complications: Secondary | ICD-10-CM | POA: Diagnosis not present

## 2022-08-27 DIAGNOSIS — F329 Major depressive disorder, single episode, unspecified: Secondary | ICD-10-CM | POA: Diagnosis not present

## 2022-08-27 DIAGNOSIS — I890 Lymphedema, not elsewhere classified: Secondary | ICD-10-CM | POA: Diagnosis not present

## 2022-08-27 DIAGNOSIS — I872 Venous insufficiency (chronic) (peripheral): Secondary | ICD-10-CM | POA: Diagnosis not present

## 2022-08-27 DIAGNOSIS — I152 Hypertension secondary to endocrine disorders: Secondary | ICD-10-CM | POA: Diagnosis not present

## 2022-09-01 DIAGNOSIS — M170 Bilateral primary osteoarthritis of knee: Secondary | ICD-10-CM | POA: Diagnosis not present

## 2022-09-01 DIAGNOSIS — M4807 Spinal stenosis, lumbosacral region: Secondary | ICD-10-CM | POA: Diagnosis not present

## 2022-09-01 DIAGNOSIS — M479 Spondylosis, unspecified: Secondary | ICD-10-CM | POA: Diagnosis not present

## 2022-09-01 DIAGNOSIS — R1319 Other dysphagia: Secondary | ICD-10-CM | POA: Diagnosis not present

## 2022-09-01 DIAGNOSIS — E1159 Type 2 diabetes mellitus with other circulatory complications: Secondary | ICD-10-CM | POA: Diagnosis not present

## 2022-09-01 DIAGNOSIS — E034 Atrophy of thyroid (acquired): Secondary | ICD-10-CM | POA: Diagnosis not present

## 2022-09-01 DIAGNOSIS — E1151 Type 2 diabetes mellitus with diabetic peripheral angiopathy without gangrene: Secondary | ICD-10-CM | POA: Diagnosis not present

## 2022-09-01 DIAGNOSIS — F411 Generalized anxiety disorder: Secondary | ICD-10-CM | POA: Diagnosis not present

## 2022-09-01 DIAGNOSIS — E785 Hyperlipidemia, unspecified: Secondary | ICD-10-CM | POA: Diagnosis not present

## 2022-09-01 DIAGNOSIS — E1169 Type 2 diabetes mellitus with other specified complication: Secondary | ICD-10-CM | POA: Diagnosis not present

## 2022-09-01 DIAGNOSIS — I152 Hypertension secondary to endocrine disorders: Secondary | ICD-10-CM | POA: Diagnosis not present

## 2022-09-01 DIAGNOSIS — I872 Venous insufficiency (chronic) (peripheral): Secondary | ICD-10-CM | POA: Diagnosis not present

## 2022-09-01 DIAGNOSIS — F329 Major depressive disorder, single episode, unspecified: Secondary | ICD-10-CM | POA: Diagnosis not present

## 2022-09-01 DIAGNOSIS — I890 Lymphedema, not elsewhere classified: Secondary | ICD-10-CM | POA: Diagnosis not present

## 2022-09-01 DIAGNOSIS — Z9181 History of falling: Secondary | ICD-10-CM | POA: Diagnosis not present

## 2022-09-01 DIAGNOSIS — Z6841 Body Mass Index (BMI) 40.0 and over, adult: Secondary | ICD-10-CM | POA: Diagnosis not present

## 2022-09-01 DIAGNOSIS — G8929 Other chronic pain: Secondary | ICD-10-CM | POA: Diagnosis not present

## 2022-09-03 DIAGNOSIS — F329 Major depressive disorder, single episode, unspecified: Secondary | ICD-10-CM | POA: Diagnosis not present

## 2022-09-03 DIAGNOSIS — M479 Spondylosis, unspecified: Secondary | ICD-10-CM | POA: Diagnosis not present

## 2022-09-03 DIAGNOSIS — F411 Generalized anxiety disorder: Secondary | ICD-10-CM | POA: Diagnosis not present

## 2022-09-03 DIAGNOSIS — E1151 Type 2 diabetes mellitus with diabetic peripheral angiopathy without gangrene: Secondary | ICD-10-CM | POA: Diagnosis not present

## 2022-09-03 DIAGNOSIS — E1159 Type 2 diabetes mellitus with other circulatory complications: Secondary | ICD-10-CM | POA: Diagnosis not present

## 2022-09-03 DIAGNOSIS — M170 Bilateral primary osteoarthritis of knee: Secondary | ICD-10-CM | POA: Diagnosis not present

## 2022-09-03 DIAGNOSIS — I152 Hypertension secondary to endocrine disorders: Secondary | ICD-10-CM | POA: Diagnosis not present

## 2022-09-03 DIAGNOSIS — I872 Venous insufficiency (chronic) (peripheral): Secondary | ICD-10-CM | POA: Diagnosis not present

## 2022-09-03 DIAGNOSIS — R1319 Other dysphagia: Secondary | ICD-10-CM | POA: Diagnosis not present

## 2022-09-03 DIAGNOSIS — G8929 Other chronic pain: Secondary | ICD-10-CM | POA: Diagnosis not present

## 2022-09-03 DIAGNOSIS — M4807 Spinal stenosis, lumbosacral region: Secondary | ICD-10-CM | POA: Diagnosis not present

## 2022-09-03 DIAGNOSIS — E034 Atrophy of thyroid (acquired): Secondary | ICD-10-CM | POA: Diagnosis not present

## 2022-09-03 DIAGNOSIS — E1169 Type 2 diabetes mellitus with other specified complication: Secondary | ICD-10-CM | POA: Diagnosis not present

## 2022-09-03 DIAGNOSIS — I890 Lymphedema, not elsewhere classified: Secondary | ICD-10-CM | POA: Diagnosis not present

## 2022-09-03 DIAGNOSIS — E785 Hyperlipidemia, unspecified: Secondary | ICD-10-CM | POA: Diagnosis not present

## 2022-09-10 DIAGNOSIS — I890 Lymphedema, not elsewhere classified: Secondary | ICD-10-CM | POA: Diagnosis not present

## 2022-09-10 DIAGNOSIS — E785 Hyperlipidemia, unspecified: Secondary | ICD-10-CM | POA: Diagnosis not present

## 2022-09-10 DIAGNOSIS — E1159 Type 2 diabetes mellitus with other circulatory complications: Secondary | ICD-10-CM | POA: Diagnosis not present

## 2022-09-10 DIAGNOSIS — M4807 Spinal stenosis, lumbosacral region: Secondary | ICD-10-CM | POA: Diagnosis not present

## 2022-09-10 DIAGNOSIS — R1319 Other dysphagia: Secondary | ICD-10-CM | POA: Diagnosis not present

## 2022-09-10 DIAGNOSIS — I152 Hypertension secondary to endocrine disorders: Secondary | ICD-10-CM | POA: Diagnosis not present

## 2022-09-10 DIAGNOSIS — I872 Venous insufficiency (chronic) (peripheral): Secondary | ICD-10-CM | POA: Diagnosis not present

## 2022-09-10 DIAGNOSIS — E034 Atrophy of thyroid (acquired): Secondary | ICD-10-CM | POA: Diagnosis not present

## 2022-09-10 DIAGNOSIS — F411 Generalized anxiety disorder: Secondary | ICD-10-CM | POA: Diagnosis not present

## 2022-09-10 DIAGNOSIS — M170 Bilateral primary osteoarthritis of knee: Secondary | ICD-10-CM | POA: Diagnosis not present

## 2022-09-10 DIAGNOSIS — E1151 Type 2 diabetes mellitus with diabetic peripheral angiopathy without gangrene: Secondary | ICD-10-CM | POA: Diagnosis not present

## 2022-09-10 DIAGNOSIS — M479 Spondylosis, unspecified: Secondary | ICD-10-CM | POA: Diagnosis not present

## 2022-09-10 DIAGNOSIS — G8929 Other chronic pain: Secondary | ICD-10-CM | POA: Diagnosis not present

## 2022-09-10 DIAGNOSIS — E1169 Type 2 diabetes mellitus with other specified complication: Secondary | ICD-10-CM | POA: Diagnosis not present

## 2022-09-10 DIAGNOSIS — F329 Major depressive disorder, single episode, unspecified: Secondary | ICD-10-CM | POA: Diagnosis not present

## 2022-09-11 DIAGNOSIS — I890 Lymphedema, not elsewhere classified: Secondary | ICD-10-CM | POA: Diagnosis not present

## 2022-09-11 DIAGNOSIS — I152 Hypertension secondary to endocrine disorders: Secondary | ICD-10-CM | POA: Diagnosis not present

## 2022-09-11 DIAGNOSIS — F411 Generalized anxiety disorder: Secondary | ICD-10-CM | POA: Diagnosis not present

## 2022-09-11 DIAGNOSIS — E1151 Type 2 diabetes mellitus with diabetic peripheral angiopathy without gangrene: Secondary | ICD-10-CM | POA: Diagnosis not present

## 2022-09-11 DIAGNOSIS — M479 Spondylosis, unspecified: Secondary | ICD-10-CM | POA: Diagnosis not present

## 2022-09-11 DIAGNOSIS — R1319 Other dysphagia: Secondary | ICD-10-CM | POA: Diagnosis not present

## 2022-09-11 DIAGNOSIS — I872 Venous insufficiency (chronic) (peripheral): Secondary | ICD-10-CM | POA: Diagnosis not present

## 2022-09-11 DIAGNOSIS — F329 Major depressive disorder, single episode, unspecified: Secondary | ICD-10-CM | POA: Diagnosis not present

## 2022-09-11 DIAGNOSIS — M4807 Spinal stenosis, lumbosacral region: Secondary | ICD-10-CM | POA: Diagnosis not present

## 2022-09-11 DIAGNOSIS — M170 Bilateral primary osteoarthritis of knee: Secondary | ICD-10-CM | POA: Diagnosis not present

## 2022-09-11 DIAGNOSIS — G8929 Other chronic pain: Secondary | ICD-10-CM | POA: Diagnosis not present

## 2022-09-11 DIAGNOSIS — E1169 Type 2 diabetes mellitus with other specified complication: Secondary | ICD-10-CM | POA: Diagnosis not present

## 2022-09-11 DIAGNOSIS — E1159 Type 2 diabetes mellitus with other circulatory complications: Secondary | ICD-10-CM | POA: Diagnosis not present

## 2022-09-11 DIAGNOSIS — E785 Hyperlipidemia, unspecified: Secondary | ICD-10-CM | POA: Diagnosis not present

## 2022-09-11 DIAGNOSIS — E034 Atrophy of thyroid (acquired): Secondary | ICD-10-CM | POA: Diagnosis not present

## 2022-09-18 DIAGNOSIS — I890 Lymphedema, not elsewhere classified: Secondary | ICD-10-CM | POA: Diagnosis not present

## 2022-09-18 DIAGNOSIS — E1169 Type 2 diabetes mellitus with other specified complication: Secondary | ICD-10-CM | POA: Diagnosis not present

## 2022-09-18 DIAGNOSIS — M479 Spondylosis, unspecified: Secondary | ICD-10-CM | POA: Diagnosis not present

## 2022-09-18 DIAGNOSIS — I872 Venous insufficiency (chronic) (peripheral): Secondary | ICD-10-CM | POA: Diagnosis not present

## 2022-09-18 DIAGNOSIS — F411 Generalized anxiety disorder: Secondary | ICD-10-CM | POA: Diagnosis not present

## 2022-09-18 DIAGNOSIS — I152 Hypertension secondary to endocrine disorders: Secondary | ICD-10-CM | POA: Diagnosis not present

## 2022-09-18 DIAGNOSIS — E785 Hyperlipidemia, unspecified: Secondary | ICD-10-CM | POA: Diagnosis not present

## 2022-09-18 DIAGNOSIS — G8929 Other chronic pain: Secondary | ICD-10-CM | POA: Diagnosis not present

## 2022-09-18 DIAGNOSIS — M4807 Spinal stenosis, lumbosacral region: Secondary | ICD-10-CM | POA: Diagnosis not present

## 2022-09-18 DIAGNOSIS — R1319 Other dysphagia: Secondary | ICD-10-CM | POA: Diagnosis not present

## 2022-09-18 DIAGNOSIS — E034 Atrophy of thyroid (acquired): Secondary | ICD-10-CM | POA: Diagnosis not present

## 2022-09-18 DIAGNOSIS — E1151 Type 2 diabetes mellitus with diabetic peripheral angiopathy without gangrene: Secondary | ICD-10-CM | POA: Diagnosis not present

## 2022-09-18 DIAGNOSIS — F329 Major depressive disorder, single episode, unspecified: Secondary | ICD-10-CM | POA: Diagnosis not present

## 2022-09-18 DIAGNOSIS — M170 Bilateral primary osteoarthritis of knee: Secondary | ICD-10-CM | POA: Diagnosis not present

## 2022-09-18 DIAGNOSIS — E1159 Type 2 diabetes mellitus with other circulatory complications: Secondary | ICD-10-CM | POA: Diagnosis not present

## 2022-09-24 ENCOUNTER — Telehealth: Payer: Self-pay

## 2022-09-24 NOTE — Telephone Encounter (Signed)
Copied from CRM [id number]. Topic: General - Other >> Sep 23, 2022  4:53 PM Turkey B wrote: Reason for CRM: Shirleen from [facility] called in states pt's OT discharge will be next week.

## 2022-09-26 NOTE — Telephone Encounter (Signed)
Noted  

## 2022-09-29 DIAGNOSIS — E1151 Type 2 diabetes mellitus with diabetic peripheral angiopathy without gangrene: Secondary | ICD-10-CM | POA: Diagnosis not present

## 2022-09-29 DIAGNOSIS — F411 Generalized anxiety disorder: Secondary | ICD-10-CM | POA: Diagnosis not present

## 2022-09-29 DIAGNOSIS — M4807 Spinal stenosis, lumbosacral region: Secondary | ICD-10-CM | POA: Diagnosis not present

## 2022-09-29 DIAGNOSIS — E1169 Type 2 diabetes mellitus with other specified complication: Secondary | ICD-10-CM | POA: Diagnosis not present

## 2022-09-29 DIAGNOSIS — G8929 Other chronic pain: Secondary | ICD-10-CM | POA: Diagnosis not present

## 2022-09-29 DIAGNOSIS — M170 Bilateral primary osteoarthritis of knee: Secondary | ICD-10-CM | POA: Diagnosis not present

## 2022-09-29 DIAGNOSIS — M479 Spondylosis, unspecified: Secondary | ICD-10-CM | POA: Diagnosis not present

## 2022-09-29 DIAGNOSIS — E1159 Type 2 diabetes mellitus with other circulatory complications: Secondary | ICD-10-CM | POA: Diagnosis not present

## 2022-09-29 DIAGNOSIS — E034 Atrophy of thyroid (acquired): Secondary | ICD-10-CM | POA: Diagnosis not present

## 2022-09-29 DIAGNOSIS — F329 Major depressive disorder, single episode, unspecified: Secondary | ICD-10-CM | POA: Diagnosis not present

## 2022-09-29 DIAGNOSIS — I890 Lymphedema, not elsewhere classified: Secondary | ICD-10-CM | POA: Diagnosis not present

## 2022-09-29 DIAGNOSIS — E785 Hyperlipidemia, unspecified: Secondary | ICD-10-CM | POA: Diagnosis not present

## 2022-09-29 DIAGNOSIS — R1319 Other dysphagia: Secondary | ICD-10-CM | POA: Diagnosis not present

## 2022-09-29 DIAGNOSIS — I152 Hypertension secondary to endocrine disorders: Secondary | ICD-10-CM | POA: Diagnosis not present

## 2022-09-29 DIAGNOSIS — I872 Venous insufficiency (chronic) (peripheral): Secondary | ICD-10-CM | POA: Diagnosis not present

## 2022-09-30 DIAGNOSIS — M4807 Spinal stenosis, lumbosacral region: Secondary | ICD-10-CM | POA: Diagnosis not present

## 2022-09-30 DIAGNOSIS — R1319 Other dysphagia: Secondary | ICD-10-CM | POA: Diagnosis not present

## 2022-09-30 DIAGNOSIS — E1169 Type 2 diabetes mellitus with other specified complication: Secondary | ICD-10-CM | POA: Diagnosis not present

## 2022-09-30 DIAGNOSIS — I890 Lymphedema, not elsewhere classified: Secondary | ICD-10-CM | POA: Diagnosis not present

## 2022-09-30 DIAGNOSIS — G8929 Other chronic pain: Secondary | ICD-10-CM | POA: Diagnosis not present

## 2022-09-30 DIAGNOSIS — E1159 Type 2 diabetes mellitus with other circulatory complications: Secondary | ICD-10-CM | POA: Diagnosis not present

## 2022-09-30 DIAGNOSIS — M479 Spondylosis, unspecified: Secondary | ICD-10-CM | POA: Diagnosis not present

## 2022-09-30 DIAGNOSIS — E785 Hyperlipidemia, unspecified: Secondary | ICD-10-CM | POA: Diagnosis not present

## 2022-09-30 DIAGNOSIS — E034 Atrophy of thyroid (acquired): Secondary | ICD-10-CM | POA: Diagnosis not present

## 2022-09-30 DIAGNOSIS — I872 Venous insufficiency (chronic) (peripheral): Secondary | ICD-10-CM | POA: Diagnosis not present

## 2022-09-30 DIAGNOSIS — E1151 Type 2 diabetes mellitus with diabetic peripheral angiopathy without gangrene: Secondary | ICD-10-CM | POA: Diagnosis not present

## 2022-09-30 DIAGNOSIS — F411 Generalized anxiety disorder: Secondary | ICD-10-CM | POA: Diagnosis not present

## 2022-09-30 DIAGNOSIS — F329 Major depressive disorder, single episode, unspecified: Secondary | ICD-10-CM | POA: Diagnosis not present

## 2022-09-30 DIAGNOSIS — M170 Bilateral primary osteoarthritis of knee: Secondary | ICD-10-CM | POA: Diagnosis not present

## 2022-09-30 DIAGNOSIS — I152 Hypertension secondary to endocrine disorders: Secondary | ICD-10-CM | POA: Diagnosis not present

## 2022-10-01 DIAGNOSIS — G8929 Other chronic pain: Secondary | ICD-10-CM | POA: Diagnosis not present

## 2022-10-01 DIAGNOSIS — R1319 Other dysphagia: Secondary | ICD-10-CM | POA: Diagnosis not present

## 2022-10-01 DIAGNOSIS — F329 Major depressive disorder, single episode, unspecified: Secondary | ICD-10-CM | POA: Diagnosis not present

## 2022-10-01 DIAGNOSIS — I152 Hypertension secondary to endocrine disorders: Secondary | ICD-10-CM | POA: Diagnosis not present

## 2022-10-01 DIAGNOSIS — F411 Generalized anxiety disorder: Secondary | ICD-10-CM | POA: Diagnosis not present

## 2022-10-01 DIAGNOSIS — E1151 Type 2 diabetes mellitus with diabetic peripheral angiopathy without gangrene: Secondary | ICD-10-CM | POA: Diagnosis not present

## 2022-10-01 DIAGNOSIS — M4807 Spinal stenosis, lumbosacral region: Secondary | ICD-10-CM | POA: Diagnosis not present

## 2022-10-01 DIAGNOSIS — M479 Spondylosis, unspecified: Secondary | ICD-10-CM | POA: Diagnosis not present

## 2022-10-01 DIAGNOSIS — E1169 Type 2 diabetes mellitus with other specified complication: Secondary | ICD-10-CM | POA: Diagnosis not present

## 2022-10-01 DIAGNOSIS — I890 Lymphedema, not elsewhere classified: Secondary | ICD-10-CM | POA: Diagnosis not present

## 2022-10-01 DIAGNOSIS — E034 Atrophy of thyroid (acquired): Secondary | ICD-10-CM | POA: Diagnosis not present

## 2022-10-01 DIAGNOSIS — E785 Hyperlipidemia, unspecified: Secondary | ICD-10-CM | POA: Diagnosis not present

## 2022-10-01 DIAGNOSIS — E1159 Type 2 diabetes mellitus with other circulatory complications: Secondary | ICD-10-CM | POA: Diagnosis not present

## 2022-10-01 DIAGNOSIS — I872 Venous insufficiency (chronic) (peripheral): Secondary | ICD-10-CM | POA: Diagnosis not present

## 2022-10-01 DIAGNOSIS — M170 Bilateral primary osteoarthritis of knee: Secondary | ICD-10-CM | POA: Diagnosis not present

## 2022-10-10 DIAGNOSIS — R1319 Other dysphagia: Secondary | ICD-10-CM | POA: Diagnosis not present

## 2022-10-10 DIAGNOSIS — F329 Major depressive disorder, single episode, unspecified: Secondary | ICD-10-CM | POA: Diagnosis not present

## 2022-10-10 DIAGNOSIS — E1169 Type 2 diabetes mellitus with other specified complication: Secondary | ICD-10-CM | POA: Diagnosis not present

## 2022-10-10 DIAGNOSIS — E034 Atrophy of thyroid (acquired): Secondary | ICD-10-CM | POA: Diagnosis not present

## 2022-10-10 DIAGNOSIS — M170 Bilateral primary osteoarthritis of knee: Secondary | ICD-10-CM | POA: Diagnosis not present

## 2022-10-10 DIAGNOSIS — E1151 Type 2 diabetes mellitus with diabetic peripheral angiopathy without gangrene: Secondary | ICD-10-CM | POA: Diagnosis not present

## 2022-10-10 DIAGNOSIS — M479 Spondylosis, unspecified: Secondary | ICD-10-CM | POA: Diagnosis not present

## 2022-10-10 DIAGNOSIS — M4807 Spinal stenosis, lumbosacral region: Secondary | ICD-10-CM | POA: Diagnosis not present

## 2022-10-10 DIAGNOSIS — I872 Venous insufficiency (chronic) (peripheral): Secondary | ICD-10-CM | POA: Diagnosis not present

## 2022-10-10 DIAGNOSIS — G8929 Other chronic pain: Secondary | ICD-10-CM | POA: Diagnosis not present

## 2022-10-10 DIAGNOSIS — I890 Lymphedema, not elsewhere classified: Secondary | ICD-10-CM | POA: Diagnosis not present

## 2022-10-10 DIAGNOSIS — E1159 Type 2 diabetes mellitus with other circulatory complications: Secondary | ICD-10-CM | POA: Diagnosis not present

## 2022-10-10 DIAGNOSIS — F411 Generalized anxiety disorder: Secondary | ICD-10-CM | POA: Diagnosis not present

## 2022-10-10 DIAGNOSIS — I152 Hypertension secondary to endocrine disorders: Secondary | ICD-10-CM | POA: Diagnosis not present

## 2022-10-10 DIAGNOSIS — E785 Hyperlipidemia, unspecified: Secondary | ICD-10-CM | POA: Diagnosis not present

## 2022-10-13 DIAGNOSIS — I152 Hypertension secondary to endocrine disorders: Secondary | ICD-10-CM

## 2022-10-13 DIAGNOSIS — M170 Bilateral primary osteoarthritis of knee: Secondary | ICD-10-CM

## 2022-10-13 DIAGNOSIS — E1159 Type 2 diabetes mellitus with other circulatory complications: Secondary | ICD-10-CM

## 2022-10-13 DIAGNOSIS — E034 Atrophy of thyroid (acquired): Secondary | ICD-10-CM

## 2022-10-13 DIAGNOSIS — F411 Generalized anxiety disorder: Secondary | ICD-10-CM

## 2022-10-13 DIAGNOSIS — M479 Spondylosis, unspecified: Secondary | ICD-10-CM

## 2022-10-13 DIAGNOSIS — F329 Major depressive disorder, single episode, unspecified: Secondary | ICD-10-CM

## 2022-10-13 DIAGNOSIS — M4807 Spinal stenosis, lumbosacral region: Secondary | ICD-10-CM

## 2022-10-13 DIAGNOSIS — I872 Venous insufficiency (chronic) (peripheral): Secondary | ICD-10-CM

## 2022-10-13 DIAGNOSIS — E1151 Type 2 diabetes mellitus with diabetic peripheral angiopathy without gangrene: Secondary | ICD-10-CM

## 2022-10-13 DIAGNOSIS — G8929 Other chronic pain: Secondary | ICD-10-CM

## 2022-10-13 DIAGNOSIS — I890 Lymphedema, not elsewhere classified: Secondary | ICD-10-CM

## 2022-10-15 DIAGNOSIS — I152 Hypertension secondary to endocrine disorders: Secondary | ICD-10-CM | POA: Diagnosis not present

## 2022-10-15 DIAGNOSIS — E785 Hyperlipidemia, unspecified: Secondary | ICD-10-CM | POA: Diagnosis not present

## 2022-10-15 DIAGNOSIS — E1159 Type 2 diabetes mellitus with other circulatory complications: Secondary | ICD-10-CM | POA: Diagnosis not present

## 2022-10-15 DIAGNOSIS — E1151 Type 2 diabetes mellitus with diabetic peripheral angiopathy without gangrene: Secondary | ICD-10-CM | POA: Diagnosis not present

## 2022-10-15 DIAGNOSIS — M170 Bilateral primary osteoarthritis of knee: Secondary | ICD-10-CM | POA: Diagnosis not present

## 2022-10-15 DIAGNOSIS — F329 Major depressive disorder, single episode, unspecified: Secondary | ICD-10-CM | POA: Diagnosis not present

## 2022-10-15 DIAGNOSIS — I890 Lymphedema, not elsewhere classified: Secondary | ICD-10-CM | POA: Diagnosis not present

## 2022-10-15 DIAGNOSIS — E034 Atrophy of thyroid (acquired): Secondary | ICD-10-CM | POA: Diagnosis not present

## 2022-10-15 DIAGNOSIS — M479 Spondylosis, unspecified: Secondary | ICD-10-CM | POA: Diagnosis not present

## 2022-10-15 DIAGNOSIS — E1169 Type 2 diabetes mellitus with other specified complication: Secondary | ICD-10-CM | POA: Diagnosis not present

## 2022-10-15 DIAGNOSIS — F411 Generalized anxiety disorder: Secondary | ICD-10-CM | POA: Diagnosis not present

## 2022-10-15 DIAGNOSIS — I872 Venous insufficiency (chronic) (peripheral): Secondary | ICD-10-CM | POA: Diagnosis not present

## 2022-10-15 DIAGNOSIS — R1319 Other dysphagia: Secondary | ICD-10-CM | POA: Diagnosis not present

## 2022-10-15 DIAGNOSIS — M4807 Spinal stenosis, lumbosacral region: Secondary | ICD-10-CM | POA: Diagnosis not present

## 2022-10-15 DIAGNOSIS — G8929 Other chronic pain: Secondary | ICD-10-CM | POA: Diagnosis not present

## 2022-10-22 DIAGNOSIS — I872 Venous insufficiency (chronic) (peripheral): Secondary | ICD-10-CM | POA: Diagnosis not present

## 2022-10-22 DIAGNOSIS — I890 Lymphedema, not elsewhere classified: Secondary | ICD-10-CM | POA: Diagnosis not present

## 2022-10-22 DIAGNOSIS — F411 Generalized anxiety disorder: Secondary | ICD-10-CM | POA: Diagnosis not present

## 2022-10-22 DIAGNOSIS — E1159 Type 2 diabetes mellitus with other circulatory complications: Secondary | ICD-10-CM | POA: Diagnosis not present

## 2022-10-22 DIAGNOSIS — E1151 Type 2 diabetes mellitus with diabetic peripheral angiopathy without gangrene: Secondary | ICD-10-CM | POA: Diagnosis not present

## 2022-10-22 DIAGNOSIS — M170 Bilateral primary osteoarthritis of knee: Secondary | ICD-10-CM | POA: Diagnosis not present

## 2022-10-22 DIAGNOSIS — F329 Major depressive disorder, single episode, unspecified: Secondary | ICD-10-CM | POA: Diagnosis not present

## 2022-10-22 DIAGNOSIS — I152 Hypertension secondary to endocrine disorders: Secondary | ICD-10-CM | POA: Diagnosis not present

## 2022-10-22 DIAGNOSIS — E785 Hyperlipidemia, unspecified: Secondary | ICD-10-CM | POA: Diagnosis not present

## 2022-10-22 DIAGNOSIS — M4807 Spinal stenosis, lumbosacral region: Secondary | ICD-10-CM | POA: Diagnosis not present

## 2022-10-22 DIAGNOSIS — R1319 Other dysphagia: Secondary | ICD-10-CM | POA: Diagnosis not present

## 2022-10-22 DIAGNOSIS — M479 Spondylosis, unspecified: Secondary | ICD-10-CM | POA: Diagnosis not present

## 2022-10-22 DIAGNOSIS — E034 Atrophy of thyroid (acquired): Secondary | ICD-10-CM | POA: Diagnosis not present

## 2022-10-22 DIAGNOSIS — G8929 Other chronic pain: Secondary | ICD-10-CM | POA: Diagnosis not present

## 2022-10-22 DIAGNOSIS — E1169 Type 2 diabetes mellitus with other specified complication: Secondary | ICD-10-CM | POA: Diagnosis not present

## 2022-10-27 ENCOUNTER — Telehealth: Payer: Self-pay | Admitting: *Deleted

## 2022-10-27 ENCOUNTER — Telehealth: Payer: Self-pay

## 2022-10-27 DIAGNOSIS — Z9989 Dependence on other enabling machines and devices: Secondary | ICD-10-CM

## 2022-10-27 DIAGNOSIS — I152 Hypertension secondary to endocrine disorders: Secondary | ICD-10-CM

## 2022-10-27 DIAGNOSIS — R262 Difficulty in walking, not elsewhere classified: Secondary | ICD-10-CM

## 2022-10-27 NOTE — Addendum Note (Signed)
Addended by: Hyacinth Meeker on: 10/27/2022 04:12 PM   Modules accepted: Orders

## 2022-10-27 NOTE — Telephone Encounter (Signed)
Copied from CRM [id number]. Topic: General - Other >> Oct 27, 2022 12:02 PM Everette C wrote: Reason for CRM: The patient has called to request contact with a Child psychotherapist or a member of staff that would be able to further discuss their medicare options for coverage of an rx order   The patient is interested in ordering an adjustable, collapsible, bed rail   The patient stresses the urgency of their request and the need for the bed rail   Please contact further when possible

## 2022-10-27 NOTE — Telephone Encounter (Signed)
Place Ref [id number].  This is for case management for [facility] patients.  They will be able to help with her concerns.

## 2022-10-27 NOTE — Progress Notes (Unsigned)
  Care Coordination  Outreach Note  10/27/2022 Name: Christina Rubio MRN: [id number] DOB: [birth year]   Care Coordination Outreach Attempts: An unsuccessful telephone outreach was attempted today to offer the patient information about available care coordination services.  Follow Up Plan:  Additional outreach attempts will be made to offer the patient care coordination information and services.   Encounter Outcome:  No Answer  Burman Nieves, CCMA Care Coordination Care Guide Direct Dial: [phone]

## 2022-10-28 NOTE — Progress Notes (Signed)
  Care Coordination   Note   10/28/2022 Name: Christina Rubio MRN: [id number] DOB: [birth year]  [name] is a 72 y.o. year old female who sees Bacigalupo, Marzella Schlein, MD for primary care. I reached out to [name] by phone today to offer care coordination services.  [name] was given information about Care Coordination services today including:   The Care Coordination services include support from the care team which includes your Nurse Coordinator, Clinical Social Worker, or Pharmacist.  The Care Coordination team is here to help remove barriers to the health concerns and goals most important to you. Care Coordination services are voluntary, and the patient may decline or stop services at any time by request to their care team member.   Care Coordination Consent Status: Patient agreed to services and verbal consent obtained.   Follow up plan:  Telephone appointment with care coordination team member scheduled for:  10/31/2022  Encounter Outcome:  Pt. Scheduled from referral   Burman Nieves, [facility] Care Coordination Care Guide Direct Dial: [phone]

## 2022-10-31 ENCOUNTER — Ambulatory Visit: Payer: Self-pay

## 2022-10-31 NOTE — Patient Instructions (Signed)
Visit Information  Thank you for taking time to visit with me today. Please don't hesitate to contact me if I can be of assistance to you.   Following are the goals we discussed today:   Goals Addressed             This Visit's Progress    Care Coordination Activities       Care Coordination Interventions: Medicare-Patient needs a walker that she feels comfortable with.  Patient is also interested in a different adjustable bed.  Currently has an adjustable bed that will be returned and wants to see if Medicare offers a better option.  Patient also wants bedside rails to be able to get in and out of bed.  Patient agreed to schedule another appointment to contact Medicare/BCBS because she is not feeling well today. OT-Patient started OT 3-4 weeks ago and staff was to return this week for an evaluation but did not show.  Patient will contact the provider for an update. Personal care-Patient would like additional support in the home with housekeeping.  Patient has used the 60 hrs of Home Health through her insurance.  Patient agrees to Clinical biochemist Providers for services and is informed the program is self-pay. Patient agreed to contact her medical provider by Monday if she continues to feel bad.  Active listening / Reflection utilized          Our next appointment is by telephone on 11/04/22 at 1pm  Please call the care guide team at [phone] if you need to cancel or reschedule your appointment.   If you are experiencing a Mental Health or Behavioral Health Crisis or need someone to talk to, please call 911  Patient verbalizes understanding of instructions and care plan provided today and agrees to view in MyChart. Active MyChart status and patient understanding of how to access instructions and care plan via MyChart confirmed with patient.     Telephone follow up appointment with care management team member scheduled for:11/04/22 at 1pm.   Lysle Morales, BSW Social  Worker [facility] Care Management  [phone]

## 2022-10-31 NOTE — Patient Outreach (Signed)
  Care Coordination   Initial Visit Note   10/31/2022 Name: Christina Rubio MRN: [id number] DOB: 09-Feb-1951  Christina Rubio is a 72 y.o. year old female who sees Bacigalupo, Marzella Schlein, MD for primary care. I spoke with  Christina Rubio by phone today.  What matters to the patients health and wellness today?  Patient needs DME equipment to remain stable in the home.  Patient is not feeling well today and request another appointment to contact insurance provider to confirm coverage.    Goals Addressed             This Visit's Progress    Care Coordination Activities       Care Coordination Interventions: Medicare-Patient needs a walker that she feels comfortable with.  Patient is also interested in a different adjustable bed.  Currently has an adjustable bed that will be returned and wants to see if Medicare offers a better option.  Patient also wants bedside rails to be able to get in and out of bed.  Patient agreed to schedule another appointment to contact Medicare/BCBS because she is not feeling well today. OT-Patient started OT 3-4 weeks ago and staff was to return this week for an evaluation but did not show.  Patient will contact the provider for an update. Personal care-Patient would like additional support in the home with housekeeping.  Patient has used the 60 hrs of Home Health through her insurance.  Patient agrees to Clinical biochemist Providers for services and is informed the program is self-pay. Patient agreed to contact her medical provider by Monday if she continues to feel bad.  Active listening / Reflection utilized          SDOH assessments and interventions completed:  Yes  SDOH Interventions Today    Flowsheet Row Most Recent Value  SDOH Interventions   Food Insecurity Interventions Intervention Not Indicated  Housing Interventions Intervention Not Indicated  Transportation Interventions --  [uses BCBSs and DIAL a Ride and friends]  Utilities Interventions  Intervention Not Indicated        Care Coordination Interventions:  Yes, provided  Interventions Today    Flowsheet Row Most Recent Value  Chronic Disease   Chronic disease during today's visit Other, Diabetes, Hypertension (HTN)  [Fell and broke knee and femur, swelling in legs/feet, mobility issues, carpol tunnel]  General Interventions   General Interventions Discussed/Reviewed General Interventions Discussed, [employer], Horticulturist, commercial (DME)  [facility] Providers for person care]  Horticulturist, commercial (DME) Dan Humphreys, Other  [Walker, bedrails, adjustable bed]        Follow up plan: Follow up call scheduled for 11/04/22 at 1pm    Encounter Outcome:  Pt. Visit Completed

## 2022-11-04 ENCOUNTER — Ambulatory Visit: Payer: Self-pay

## 2022-11-04 NOTE — Patient Outreach (Signed)
  Care Coordination   Follow Up Visit Note   11/04/2022 Name: Christina Rubio MRN: [id number] DOB: January 31, 1951  Christina Rubio is a 72 y.o. year old female who sees Bacigalupo, Marzella Schlein, MD for primary care. I spoke with  [name] by phone today.  What matters to the patients health and wellness today?  Patient is not feeling well and request to reschedule appointment.  SW suggest encourage patient to contact the doctor even thought she has a physical scheduled this week.      Goals Addressed   None     SDOH assessments and interventions completed:  No     Care Coordination Interventions:  No, not indicated   Follow up plan: Follow up call scheduled for 11/10/22 at 11am.    Encounter Outcome:  Pt. Visit Completed

## 2022-11-04 NOTE — Patient Instructions (Signed)
Visit Information  Thank you for taking time to visit with me today. Please don't hesitate to contact me if I can be of assistance to you.   Following are the goals we discussed today:   Goals Addressed   None     Our next appointment is by telephone on 11/10/22 at 11am  Please call the care guide team at [phone] if you need to cancel or reschedule your appointment.   If you are experiencing a Mental Health or Behavioral Health Crisis or need someone to talk to, please call 911  Patient verbalizes understanding of instructions and care plan provided today and agrees to view in MyChart. Active MyChart status and patient understanding of how to access instructions and care plan via MyChart confirmed with patient.     Telephone follow up appointment with care management team member scheduled for: 11/10/22 at 11am  Lysle Morales, BSW Social Worker [facility] Care Management  [phone]

## 2022-11-06 ENCOUNTER — Ambulatory Visit: Payer: Self-pay

## 2022-11-06 ENCOUNTER — Telehealth: Payer: Self-pay

## 2022-11-06 NOTE — Telephone Encounter (Signed)
Spoke with pt and advised her that I added this insurance coverage to two visits that it was not filed for.  05/20/2022 and 07/16/2022

## 2022-11-06 NOTE — Telephone Encounter (Signed)
Copied from CRM [id number]. Topic: General - Other >> Nov 05, 2022  3:31 PM Everette C wrote: Reason for CRM: The patient would like to be contacted by a member of staff to discuss the billing and coding of their previous visit on 06/09/22  The patient has received a bill that does not indicate any insurance included   Medicare Member ID [id number]  Please contact further when possible

## 2022-11-06 NOTE — Patient Outreach (Signed)
  Care Coordination   Follow Up Visit Note   11/06/2022 Name: Christina Rubio MRN: [id number] DOB: Apr 10, 1950  Christina Rubio is a 72 y.o. year old female who sees Rubio, Christina Schlein, MD for primary care. I spoke with  Christina Rubio by phone today.  What matters to the patients health and wellness today?  Patient initially left a message that she did not receive the information on Home Healthcare Providers.  Patient confirms that the information was received.    Goals Addressed   None     SDOH assessments and interventions completed:  No     Care Coordination Interventions:  Yes, provided   Interventions Today    Flowsheet Row Most Recent Value  General Interventions   General Interventions Discussed/Reviewed General Interventions Reviewed, [employer] did not receive initial email.  SW resubmitted email with list of Home Health Providers.]        Follow up plan: No further intervention required.   Encounter Outcome:  Pt. Visit Completed

## 2022-11-10 ENCOUNTER — Ambulatory Visit: Payer: Self-pay

## 2022-11-10 NOTE — Patient Outreach (Signed)
  Care Coordination   Follow Up Visit Note   11/10/2022 Name: Christina Rubio MRN: [id number] DOB: [birth year]  Christina Rubio is a 72 y.o. year old female who sees Bacigalupo, Marzella Schlein, MD for primary care. I spoke with  [name] by phone today.  What matters to the patients health and wellness today?  Patient is not feeling well and having issues with her stomach.  Request to rescheduled tomorrow.  Encouraged patient to speak with her doctor since she has been sick consistently for a while.    Goals Addressed   None     SDOH assessments and interventions completed:  No     Care Coordination Interventions:  Yes, provided   Interventions Today    Flowsheet Row Most Recent Value  General Interventions   General Interventions Discussed/Reviewed Doctor Visits  [Encourage to speak with her doctor]       Follow up plan: Follow up call scheduled for 11/11/22 at 2pm    Encounter Outcome:  Pt. Scheduled

## 2022-11-11 ENCOUNTER — Ambulatory Visit (INDEPENDENT_AMBULATORY_CARE_PROVIDER_SITE_OTHER): Payer: Medicare Other | Admitting: Family Medicine

## 2022-11-11 ENCOUNTER — Encounter: Payer: Self-pay | Admitting: Family Medicine

## 2022-11-11 ENCOUNTER — Ambulatory Visit: Payer: Self-pay

## 2022-11-11 VITALS — BP 126/60 | HR 91 | Temp 98.3°F | Resp 16 | Ht 64.0 in | Wt 372.0 lb

## 2022-11-11 DIAGNOSIS — I152 Hypertension secondary to endocrine disorders: Secondary | ICD-10-CM | POA: Diagnosis not present

## 2022-11-11 DIAGNOSIS — Z6841 Body Mass Index (BMI) 40.0 and over, adult: Secondary | ICD-10-CM

## 2022-11-11 DIAGNOSIS — H00012 Hordeolum externum right lower eyelid: Secondary | ICD-10-CM

## 2022-11-11 DIAGNOSIS — E785 Hyperlipidemia, unspecified: Secondary | ICD-10-CM | POA: Diagnosis not present

## 2022-11-11 DIAGNOSIS — Z0000 Encounter for general adult medical examination without abnormal findings: Secondary | ICD-10-CM

## 2022-11-11 DIAGNOSIS — E1169 Type 2 diabetes mellitus with other specified complication: Secondary | ICD-10-CM

## 2022-11-11 DIAGNOSIS — F411 Generalized anxiety disorder: Secondary | ICD-10-CM

## 2022-11-11 DIAGNOSIS — Z789 Other specified health status: Secondary | ICD-10-CM

## 2022-11-11 DIAGNOSIS — E1159 Type 2 diabetes mellitus with other circulatory complications: Secondary | ICD-10-CM | POA: Diagnosis not present

## 2022-11-11 DIAGNOSIS — Z23 Encounter for immunization: Secondary | ICD-10-CM

## 2022-11-11 DIAGNOSIS — Z7409 Other reduced mobility: Secondary | ICD-10-CM

## 2022-11-11 DIAGNOSIS — F331 Major depressive disorder, recurrent, moderate: Secondary | ICD-10-CM

## 2022-11-11 DIAGNOSIS — R296 Repeated falls: Secondary | ICD-10-CM

## 2022-11-11 DIAGNOSIS — E034 Atrophy of thyroid (acquired): Secondary | ICD-10-CM | POA: Diagnosis not present

## 2022-11-11 DIAGNOSIS — R82998 Other abnormal findings in urine: Secondary | ICD-10-CM

## 2022-11-11 NOTE — Assessment & Plan Note (Signed)
Looking for a new psychiatrist Needs xanax refill in the interim Continue zoloft

## 2022-11-11 NOTE — Assessment & Plan Note (Signed)
Chronic and stable No med changes today

## 2022-11-11 NOTE — Progress Notes (Signed)
Complete physical exam  Patient: Christina Rubio   DOB: 05-01-1950   72 y.o. Female  MRN: [id number]  Subjective:    Chief Complaint  Patient presents with   Medicare Wellness    Christina Rubio is a 72 y.o. female who presents today for a complete physical exam. She reports consuming a general diet.  She generally feels fairly well. She reports sleeping fairly well. She does have additional problems to discuss today.    Discussed the use of AI scribe software for clinical note transcription with the patient, who gave verbal consent to proceed.  History of Present Illness   The patient, with a history of hypertension, hyperlipidemia, hypothyroidism, carpal tunnel syndrome, and knee osteoarthritis, presents for a physical. They report several health concerns, including dark urine that has been ongoing for several months. They also mention experiencing mobility issues and having had approximately six falls in the past five months. The patient has been struggling with medication management and admits to not taking their pills regularly. They also report a recent bereavement which has made it difficult for them to manage their health.  The patient also reports a stye in their right eye, which has been causing discomfort. They have a history of carpal tunnel syndrome and describe their hands as being stiff, particularly in the latter part of the day. They also mention knee pain, which has been managed with cortisone shots from an orthopedist. The patient expresses a desire to lose weight to potentially qualify for knee surgery.  The patient also mentions having a sinus infection recently, which was treated. They also report having a home health aide, but the service was interrupted due to a misunderstanding. The patient expresses a desire to resume home health services, particularly for assistance with bathing.       Most recent fall risk assessment:    11/11/2022   10:31 AM  Fall Risk   Falls  in the past year? 1  Number falls in past yr: 1  Injury with Fall? 1  Risk for fall due to : History of fall(s);Impaired mobility;Impaired balance/gait  Follow up Falls evaluation completed     Most recent depression screenings:    11/11/2022   10:32 AM 03/11/2022    2:24 PM  PHQ 2/9 Scores  PHQ - 2 Score 2 1  PHQ- 9 Score 8 3        Patient Care Team: Erasmo Downer, MD as PCP - General (Family Medicine) Jodi Geralds, MD as Consulting Physician (Orthopedic Surgery) End, Cristal Deer, MD as Consulting Physician (Cardiology) Dalbert Garnet as Social Worker   Outpatient Medications Prior to Visit  Medication Sig   ALPRAZolam (XANAX) 0.5 MG tablet TAKE 1 TABLET BY MOUTH EVERY DAY AS NEEDED FOR ANXIETY.   atorvastatin (LIPITOR) 20 MG tablet TAKE 1 TABLET BY MOUTH DAILY   azelastine (ASTELIN) 0.1 % nasal spray Place 1 spray into both nostrils 2 (two) times daily. Use in each nostril as directed   blood glucose meter kit and supplies Dispense based on patient and insurance preference. Use once daily to check for fasting blood sugars. (FOR ICD-10 E10.9, E11.9).   Glucose Blood (BLOOD GLUCOSE TEST STRIPS 333) STRP Dispense based on patient and insurance preference. Use once daily to check for fasting blood sugars. (ICD-10 E11.69)   levothyroxine (SYNTHROID) 112 MCG tablet TAKE 1 TABLET EVERY DAY ON EMPTY STOMACHWITH A GLASS OF WATER AT LEAST 30-60 MINBEFORE BREAKFAST   loperamide (IMODIUM A-D) 2 MG tablet Take  1 tablet (2 mg total) by mouth 4 (four) times daily as needed for diarrhea or loose stools.   metoprolol succinate (TOPROL-XL) 50 MG 24 hr tablet TAKE 1 TABLET BY MOUTH DAILY WITH OR IMMEDIATELY FOLLOWING A MEAL   metroNIDAZOLE (FLAGYL) 500 MG tablet Take 1 tablet (500 mg total) by mouth 2 (two) times daily.   Multiple Vitamins-Minerals (PRESERVISION AREDS 2) CAPS Take 1 tablet by mouth 2 (two) times daily.   Probiotic Product (PROBIOTIC PO) Take by mouth daily.    sertraline (ZOLOFT) 100 MG tablet TAKE 2 TABLETS BY MOUTH DAILY WITH BREAKFAST   valsartan-hydrochlorothiazide (DIOVAN-HCT) 160-25 MG tablet TAKE 1 TABLET BY MOUTH NIGHTLY   [DISCONTINUED] doxycycline (VIBRA-TABS) 100 MG tablet Take 1 tablet (100 mg total) by mouth 2 (two) times daily. (Patient not taking: Reported on 11/11/2022)   No facility-administered medications prior to visit.    ROS per HPI     Objective:     BP 126/60 (BP Location: Right Arm, Patient Position: Sitting, Cuff Size: Large)   Pulse 91   Temp 98.3 F (36.8 C)   Resp 16   Ht 5\' 4"  (1.626 m)   Wt (!) 372 lb (168.7 kg)   SpO2 98%   BMI 63.85 kg/m    Physical Exam Vitals reviewed.  Constitutional:      General: She is not in acute distress.    Appearance: Normal appearance. She is well-developed. She is not diaphoretic.  HENT:     Head: Normocephalic and atraumatic.     Right Ear: Tympanic membrane, ear canal and external ear normal.     Left Ear: Tympanic membrane, ear canal and external ear normal.     Nose: Nose normal.     Mouth/Throat:     Mouth: Mucous membranes are moist.     Pharynx: Oropharynx is clear. No oropharyngeal exudate.  Eyes:     General: No scleral icterus.    Conjunctiva/sclera: Conjunctivae normal.     Pupils: Pupils are equal, round, and reactive to light.  Neck:     Thyroid: No thyromegaly.  Cardiovascular:     Rate and Rhythm: Normal rate and regular rhythm.     Heart sounds: Normal heart sounds. No murmur heard. Pulmonary:     Effort: Pulmonary effort is normal. No respiratory distress.     Breath sounds: Normal breath sounds. No wheezing or rales.  Abdominal:     General: There is no distension.     Palpations: Abdomen is soft.     Tenderness: There is no abdominal tenderness.  Musculoskeletal:        General: No deformity.     Cervical back: Neck supple.     Right lower leg: No edema.     Left lower leg: No edema.  Lymphadenopathy:     Cervical: No cervical  adenopathy.  Skin:    General: Skin is warm and dry.     Findings: No rash.  Neurological:     Mental Status: She is alert and oriented to person, place, and time. Mental status is at baseline.     Gait: Gait normal.  Psychiatric:        Mood and Affect: Mood normal.        Behavior: Behavior normal.        Thought Content: Thought content normal.     Physical Exam    HEENT: Stye in right eye. Right eyeball inspection reveals no abnormalities. NEUROLOGICAL: Decreased sensation to monofilament in feet. Good blood  refill in toes.       No results found for any visits on 11/11/22.     Assessment & Plan:    Routine Health Maintenance and Physical Exam  Immunization History  Administered Date(s) Administered   Fluad Quad(high Dose 65+) 01/06/2019, 12/04/2020, 11/11/2022   Influenza, High Dose Seasonal PF 01/29/2017, 04/20/2018   Influenza,inj,Quad PF,6+ Mos 01/26/2020   PFIZER(Purple Top)SARS-COV-2 Vaccination 05/20/2019, 06/14/2019   Pneumococcal Conjugate-13 05/28/2017   Pneumococcal Polysaccharide-23 07/15/2019   Td 05/24/2018   Zoster Recombinant(Shingrix) 11/17/2017    Health Maintenance  Topic Date Due   Colonoscopy  Never done   MAMMOGRAM  Never done   DEXA SCAN  Never done   Zoster Vaccines- Shingrix (2 of 2) 01/12/2018   COVID-19 Vaccine (3 - 2023-24 season) 11/22/2021   HEMOGLOBIN A1C  02/08/2022   Diabetic kidney evaluation - eGFR measurement  08/09/2022   Diabetic kidney evaluation - Urine ACR  08/09/2022   OPHTHALMOLOGY EXAM  11/13/2022   Medicare Annual Wellness (AWV)  03/12/2023   FOOT EXAM  11/11/2023   DTaP/Tdap/Td (2 - Tdap) 05/23/2028   Pneumonia Vaccine 32+ Years old  Completed   INFLUENZA VACCINE  Completed   Hepatitis C Screening  Completed   HPV VACCINES  Aged Out    Discussed health benefits of physical activity, and encouraged her to engage in regular exercise appropriate for her age and condition.  Problem List Items Addressed This  Visit       Cardiovascular and Mediastinum   Hypertension associated with diabetes (HCC)    Well controlled Continue current medications Recheck metabolic panel F/u in 6 months       Relevant Orders   Comprehensive metabolic panel     Endocrine   Hyperlipidemia associated with type 2 diabetes mellitus (HCC)    Previously well controlled Continue statin Repeat FLP and CMP Goal LDL < 70      Relevant Orders   Comprehensive metabolic panel   Lipid panel   Hypothyroidism due to acquired atrophy of thyroid    Previously with elevated TSH Continue Synthroid at current dose  Recheck TSH and adjust Synthroid as indicated        Relevant Orders   TSH   T2DM (type 2 diabetes mellitus) (HCC)    Well controlled previously Recheck A1c Assoc with/complicated by HTN, HLD Continue current medications UTD on vaccines, eye exam, foot exam (completed today) Uacr today On Statin Discussed diet and exercise F/u in 6 months       Relevant Orders   Urine Microalbumin w/creat. ratio   Hemoglobin A1c   Ambulatory referral to Home Health     Other   Morbid obesity (HCC) (Chronic)   Relevant Orders   Ambulatory referral to Home Health   MDD (major depressive disorder)    Chronic and stable No med changes today      GAD (generalized anxiety disorder)    Looking for a new psychiatrist Needs xanax refill in the interim Continue zoloft      BMI 60.0-69.9, adult (HCC)   Other Visit Diagnoses     Encounter for annual physical exam    -  Primary   Relevant Orders   Urine Microalbumin w/creat. ratio   Hemoglobin A1c   Comprehensive metabolic panel   Lipid panel   TSH   Recurrent falls       Relevant Orders   Ambulatory referral to Home Health   Mobility impaired       Relevant Orders  Ambulatory referral to Home Health   Decreased activities of daily living (ADL)       Relevant Orders   Ambulatory referral to Home Health   Dark urine       Relevant Orders   Urine  Culture   POCT Urinalysis Dipstick   Hordeolum externum of right lower eyelid       Need for influenza vaccination       Relevant Orders   Flu Vaccine QUAD High Dose(Fluad) (Completed)     Declines mammo, CRC screening, and DEXA     Possible Urinary Tract Infection Dark urine for several months. -Collect urine sample at home and send for urinalysis and culture.  Stye Complaint of discomfort in the right eye, confirmed stye on examination. -Advise warm compresses multiple times a day until it drains and resolves.  Falls and Mobility Issues Multiple falls over the past few months, difficulty with mobility and bathing due to home setup. -Reorder home health services for assistance with mobility and bathing.  Carpal Tunnel Syndrome Complaints of stiffness and discomfort in hands, worse in the left hand. -Continue current management strategies, including use of heating pad.  Chronic Medications Currently on Synthroid 112 mcg daily, Lipitor 20 mg daily, Metoprolol 50 mg daily, and Valsartan HCTZ 160/25 mg daily. -Continue current medications.  General Health Maintenance -Administer flu shot today. -Plan for COVID booster next month. -Advise to get second shingles shot at pharmacy. -Order labs for A1C, kidney and liver function, thyroid, and cholesterol. -Perform foot exam today, no issues identified.  Follow-up in 6 months or sooner if needed.         Return in about 6 months (around 05/14/2023) for chronic disease f/u.     Shirlee Latch, MD

## 2022-11-11 NOTE — Assessment & Plan Note (Signed)
Previously with elevated TSH Continue Synthroid at current dose  Recheck TSH and adjust Synthroid as indicated

## 2022-11-11 NOTE — Assessment & Plan Note (Signed)
Previously well controlled Continue statin Repeat FLP and CMP Goal LDL < 70 

## 2022-11-11 NOTE — Patient Instructions (Signed)
Visit Information  Thank you for taking time to visit with me today. Please don't hesitate to contact me if I can be of assistance to you.   Following are the goals we discussed today:   Patient to follow up with Homecare providers.   Our next appointment is by telephone on 11/25/22 at 11am  Please call the care guide team at [phone] if you need to cancel or reschedule your appointment.   If you are experiencing a Mental Health or Behavioral Health Crisis or need someone to talk to, please call [phone]  Patient verbalizes understanding of instructions and care plan provided today and agrees to view in MyChart. Active MyChart status and patient understanding of how to access instructions and care plan via MyChart confirmed with patient.     Telephone follow up appointment with care management team member scheduled for: 11/25/22 at 11am  Lysle Morales, BSW Social Worker [facility] Care Management  [phone]

## 2022-11-11 NOTE — Assessment & Plan Note (Signed)
Well controlled Continue current medications Recheck metabolic panel F/u in 6 months  

## 2022-11-11 NOTE — Patient Outreach (Signed)
  Care Coordination   Follow Up Visit Note   11/11/2022 Name: Christina Rubio MRN: [id number] DOB: September 18, 1950  Christina Rubio is a 72 y.o. year old female who sees Bacigalupo, Marzella Schlein, MD for primary care. I spoke with  [name] by phone today.  What matters to the patients health and wellness today?  Patient states she contacted [facility] but has not received a return call.  Agreed to call again to set up personal care.      Goals Addressed             This Visit's Progress    Care Coordination Activities       Care Coordination Interventions: Patient agreed to contact Homecare Providers for personal care.  Their fee is affordable and she feels that 2 day a week would be more than enough assistance.   Patient spoke with her provider today and an order was completed for DME, OT and PT.        SDOH assessments and interventions completed:  No     Care Coordination Interventions:  Yes, provided   Follow up plan: Follow up call scheduled for 11/24/32 at 11am.    Encounter Outcome:  Pt. Visit Completed

## 2022-11-11 NOTE — Assessment & Plan Note (Signed)
Well controlled previously Recheck A1c Assoc with/complicated by HTN, HLD Continue current medications UTD on vaccines, eye exam, foot exam (completed today) Uacr today On Statin Discussed diet and exercise F/u in 6 months

## 2022-11-12 ENCOUNTER — Telehealth: Payer: Self-pay

## 2022-11-12 LAB — COMPREHENSIVE METABOLIC PANEL
ALT: 7 IU/L (ref 0–32)
AST: 15 IU/L (ref 0–40)
Albumin: 4.4 g/dL (ref 3.8–4.8)
Alkaline Phosphatase: 62 IU/L (ref 44–121)
BUN/Creatinine Ratio: 15 (ref 12–28)
BUN: 17 mg/dL (ref 8–27)
Bilirubin Total: 0.7 mg/dL (ref 0.0–1.2)
CO2: 16 mmol/L — ABNORMAL LOW (ref 20–29)
Calcium: 9.7 mg/dL (ref 8.7–10.3)
Chloride: 100 mmol/L (ref 96–106)
Creatinine, Ser: 1.11 mg/dL — ABNORMAL HIGH (ref 0.57–1.00)
Globulin, Total: 3.2 g/dL (ref 1.5–4.5)
Glucose: 143 mg/dL — ABNORMAL HIGH (ref 70–99)
Potassium: 3.2 mmol/L — ABNORMAL LOW (ref 3.5–5.2)
Sodium: 135 mmol/L (ref 134–144)
Total Protein: 7.6 g/dL (ref 6.0–8.5)
eGFR: 53 mL/min/{1.73_m2} — ABNORMAL LOW (ref >59)

## 2022-11-12 LAB — LIPID PANEL
Chol/HDL Ratio: 3.7 ratio (ref 0.0–4.4)
Cholesterol, Total: 168 mg/dL (ref 100–199)
HDL: 46 mg/dL (ref >39)
LDL Chol Calc (NIH): 90 mg/dL (ref 0–99)
Triglycerides: 190 mg/dL — ABNORMAL HIGH (ref 0–149)
VLDL Cholesterol Cal: 32 mg/dL (ref 5–40)

## 2022-11-12 LAB — HEMOGLOBIN A1C
Est. average glucose Bld gHb Est-mCnc: 123 mg/dL
Hgb A1c MFr Bld: 5.9 % — ABNORMAL HIGH (ref 4.8–5.6)

## 2022-11-12 LAB — TSH: TSH: 7.19 u[IU]/mL — ABNORMAL HIGH (ref 0.450–4.500)

## 2022-11-12 NOTE — Telephone Encounter (Signed)
Copied from CRM [id number]. Topic: General - Inquiry >> Nov 12, 2022  4:36 PM Marlow Baars wrote: Reason for CRM: The patient called in wanting her provider to know that she did not fast for her labs yesterday but she wasn't told that it needed to be. Also she wants to know if her urine sample needs to be a fasting urine sample. Please assist patient further.

## 2022-11-13 DIAGNOSIS — E119 Type 2 diabetes mellitus without complications: Secondary | ICD-10-CM | POA: Diagnosis not present

## 2022-11-13 DIAGNOSIS — Z7984 Long term (current) use of oral hypoglycemic drugs: Secondary | ICD-10-CM | POA: Diagnosis not present

## 2022-11-13 DIAGNOSIS — H25012 Cortical age-related cataract, left eye: Secondary | ICD-10-CM | POA: Diagnosis not present

## 2022-11-13 DIAGNOSIS — H00019 Hordeolum externum unspecified eye, unspecified eyelid: Secondary | ICD-10-CM | POA: Diagnosis not present

## 2022-11-13 DIAGNOSIS — H2512 Age-related nuclear cataract, left eye: Secondary | ICD-10-CM | POA: Diagnosis not present

## 2022-11-13 LAB — HM DIABETES EYE EXAM

## 2022-11-13 NOTE — Telephone Encounter (Signed)
Patient advised that urine sample does not need to be fasting.

## 2022-11-14 ENCOUNTER — Telehealth: Payer: Self-pay

## 2022-11-14 DIAGNOSIS — E1159 Type 2 diabetes mellitus with other circulatory complications: Secondary | ICD-10-CM

## 2022-11-14 DIAGNOSIS — E034 Atrophy of thyroid (acquired): Secondary | ICD-10-CM

## 2022-11-14 NOTE — Telephone Encounter (Signed)
-----   Message from Shirlee Latch sent at 11/13/2022 11:24 AM EDT ----- Normal/stable labs, except potassium is slightly low.  Increase potassium containing foods. Recheck in 1-2 weeks.  Cholesterol is not to goal. If taking atorvastatin regularly, recommend increasing to 40 mg daily (CMAs ok to send #90 r1 if patient agrees).  TSH is also elevated slightly suggesting too little Synthroid.  If not taking it regularly, please resume. If you are taking it regularly, recommend increasing the dose to 125 mcg daily (CMAs ok to send in #90 r1 if patient agrees). Repeat TSH in 2 months.

## 2022-11-17 DIAGNOSIS — K080 Exfoliation of teeth due to systemic causes: Secondary | ICD-10-CM | POA: Diagnosis not present

## 2022-11-21 ENCOUNTER — Inpatient Hospital Stay
Admission: EM | Admit: 2022-11-21 | Discharge: 2022-11-24 | DRG: 062 | Disposition: A | Discharge status: Home Health | Payer: Medicare Other | Attending: Internal Medicine | Admitting: Internal Medicine

## 2022-11-21 ENCOUNTER — Emergency Department: Payer: Medicare Other

## 2022-11-21 ENCOUNTER — Other Ambulatory Visit: Payer: Self-pay

## 2022-11-21 ENCOUNTER — Inpatient Hospital Stay: Payer: Medicare Other

## 2022-11-21 DIAGNOSIS — Z8673 Personal history of transient ischemic attack (TIA), and cerebral infarction without residual deficits: Secondary | ICD-10-CM | POA: Diagnosis present

## 2022-11-21 DIAGNOSIS — I6389 Other cerebral infarction: Principal | ICD-10-CM | POA: Diagnosis present

## 2022-11-21 DIAGNOSIS — E1169 Type 2 diabetes mellitus with other specified complication: Secondary | ICD-10-CM | POA: Diagnosis present

## 2022-11-21 DIAGNOSIS — R110 Nausea: Secondary | ICD-10-CM | POA: Diagnosis not present

## 2022-11-21 DIAGNOSIS — Z6841 Body Mass Index (BMI) 40.0 and over, adult: Secondary | ICD-10-CM

## 2022-11-21 DIAGNOSIS — E1122 Type 2 diabetes mellitus with diabetic chronic kidney disease: Secondary | ICD-10-CM | POA: Diagnosis present

## 2022-11-21 DIAGNOSIS — N170 Acute kidney failure with tubular necrosis: Secondary | ICD-10-CM | POA: Diagnosis not present

## 2022-11-21 DIAGNOSIS — I639 Cerebral infarction, unspecified: Secondary | ICD-10-CM | POA: Diagnosis present

## 2022-11-21 DIAGNOSIS — G8324 Monoplegia of upper limb affecting left nondominant side: Secondary | ICD-10-CM | POA: Diagnosis not present

## 2022-11-21 DIAGNOSIS — Z823 Family history of stroke: Secondary | ICD-10-CM | POA: Diagnosis not present

## 2022-11-21 DIAGNOSIS — R414 Neurologic neglect syndrome: Secondary | ICD-10-CM | POA: Diagnosis not present

## 2022-11-21 DIAGNOSIS — I672 Cerebral atherosclerosis: Secondary | ICD-10-CM | POA: Diagnosis not present

## 2022-11-21 DIAGNOSIS — E876 Hypokalemia: Secondary | ICD-10-CM | POA: Diagnosis not present

## 2022-11-21 DIAGNOSIS — R001 Bradycardia, unspecified: Secondary | ICD-10-CM | POA: Diagnosis not present

## 2022-11-21 DIAGNOSIS — Z85828 Personal history of other malignant neoplasm of skin: Secondary | ICD-10-CM

## 2022-11-21 DIAGNOSIS — N1831 Chronic kidney disease, stage 3a: Secondary | ICD-10-CM | POA: Diagnosis present

## 2022-11-21 DIAGNOSIS — Z9181 History of falling: Secondary | ICD-10-CM | POA: Diagnosis not present

## 2022-11-21 DIAGNOSIS — R9089 Other abnormal findings on diagnostic imaging of central nervous system: Secondary | ICD-10-CM | POA: Diagnosis not present

## 2022-11-21 DIAGNOSIS — I63511 Cerebral infarction due to unspecified occlusion or stenosis of right middle cerebral artery: Secondary | ICD-10-CM | POA: Diagnosis not present

## 2022-11-21 DIAGNOSIS — R29703 NIHSS score 3: Secondary | ICD-10-CM | POA: Diagnosis not present

## 2022-11-21 DIAGNOSIS — F419 Anxiety disorder, unspecified: Secondary | ICD-10-CM | POA: Diagnosis not present

## 2022-11-21 DIAGNOSIS — F32A Depression, unspecified: Secondary | ICD-10-CM | POA: Diagnosis not present

## 2022-11-21 DIAGNOSIS — Z79899 Other long term (current) drug therapy: Secondary | ICD-10-CM

## 2022-11-21 DIAGNOSIS — E039 Hypothyroidism, unspecified: Secondary | ICD-10-CM | POA: Diagnosis not present

## 2022-11-21 DIAGNOSIS — I6782 Cerebral ischemia: Secondary | ICD-10-CM | POA: Diagnosis not present

## 2022-11-21 DIAGNOSIS — I6523 Occlusion and stenosis of bilateral carotid arteries: Secondary | ICD-10-CM | POA: Diagnosis not present

## 2022-11-21 DIAGNOSIS — I480 Paroxysmal atrial fibrillation: Secondary | ICD-10-CM | POA: Insufficient documentation

## 2022-11-21 DIAGNOSIS — R29818 Other symptoms and signs involving the nervous system: Secondary | ICD-10-CM | POA: Diagnosis not present

## 2022-11-21 DIAGNOSIS — E785 Hyperlipidemia, unspecified: Secondary | ICD-10-CM | POA: Diagnosis present

## 2022-11-21 DIAGNOSIS — E1159 Type 2 diabetes mellitus with other circulatory complications: Secondary | ICD-10-CM | POA: Diagnosis present

## 2022-11-21 DIAGNOSIS — Z79890 Hormone replacement therapy (postmenopausal): Secondary | ICD-10-CM | POA: Diagnosis not present

## 2022-11-21 DIAGNOSIS — I152 Hypertension secondary to endocrine disorders: Secondary | ICD-10-CM | POA: Diagnosis not present

## 2022-11-21 HISTORY — DX: Morbid (severe) obesity due to excess calories: E66.01

## 2022-11-21 LAB — CBC
HCT: 40.9 % (ref 36.0–46.0)
HCT: 42.5 % (ref 36.0–46.0)
Hemoglobin: 13.2 g/dL (ref 12.0–15.0)
Hemoglobin: 13.8 g/dL (ref 12.0–15.0)
MCH: 27.8 pg (ref 26.0–34.0)
MCH: 28.2 pg (ref 26.0–34.0)
MCHC: 32.3 g/dL (ref 30.0–36.0)
MCHC: 32.5 g/dL (ref 30.0–36.0)
MCV: 86.3 fL (ref 80.0–100.0)
MCV: 86.7 fL (ref 80.0–100.0)
Platelets: 271 10*3/uL (ref 150–400)
Platelets: 280 10*3/uL (ref 150–400)
RBC: 4.74 MIL/uL (ref 3.87–5.11)
RBC: 4.9 MIL/uL (ref 3.87–5.11)
RDW: 13.2 % (ref 11.5–15.5)
RDW: 13.2 % (ref 11.5–15.5)
WBC: 8.7 10*3/uL (ref 4.0–10.5)
WBC: 9.8 10*3/uL (ref 4.0–10.5)
nRBC: 0 % (ref 0.0–0.2)
nRBC: 0 % (ref 0.0–0.2)

## 2022-11-21 LAB — LIPASE, BLOOD: Lipase: 52 U/L — ABNORMAL HIGH (ref 11–51)

## 2022-11-21 LAB — DIFFERENTIAL
Abs Immature Granulocytes: 0.04 10*3/uL (ref 0.00–0.07)
Basophils Absolute: 0.1 10*3/uL (ref 0.0–0.1)
Basophils Relative: 1 %
Eosinophils Absolute: 0.1 10*3/uL (ref 0.0–0.5)
Eosinophils Relative: 1 %
Immature Granulocytes: 0 %
Lymphocytes Relative: 18 %
Lymphs Abs: 1.8 10*3/uL (ref 0.7–4.0)
Monocytes Absolute: 0.5 10*3/uL (ref 0.1–1.0)
Monocytes Relative: 5 %
Neutro Abs: 7.3 10*3/uL (ref 1.7–7.7)
Neutrophils Relative %: 75 %

## 2022-11-21 LAB — TROPONIN I (HIGH SENSITIVITY): Troponin I (High Sensitivity): 7 ng/L (ref <18)

## 2022-11-21 LAB — COMPREHENSIVE METABOLIC PANEL
ALT: 10 U/L (ref 0–44)
AST: 16 U/L (ref 15–41)
Albumin: 3.9 g/dL (ref 3.5–5.0)
Alkaline Phosphatase: 50 U/L (ref 38–126)
Anion gap: 11 (ref 5–15)
BUN: 20 mg/dL (ref 8–23)
CO2: 22 mmol/L (ref 22–32)
Calcium: 9 mg/dL (ref 8.9–10.3)
Chloride: 99 mmol/L (ref 98–111)
Creatinine, Ser: 1.1 mg/dL — ABNORMAL HIGH (ref 0.44–1.00)
GFR, Estimated: 54 mL/min — ABNORMAL LOW (ref >60)
Glucose, Bld: 121 mg/dL — ABNORMAL HIGH (ref 70–99)
Potassium: 3.2 mmol/L — ABNORMAL LOW (ref 3.5–5.1)
Sodium: 132 mmol/L — ABNORMAL LOW (ref 135–145)
Total Bilirubin: 0.7 mg/dL (ref 0.3–1.2)
Total Protein: 7.9 g/dL (ref 6.5–8.1)

## 2022-11-21 LAB — APTT: aPTT: 31 seconds (ref 24–36)

## 2022-11-21 LAB — ETHANOL: Alcohol, Ethyl (B): <10 mg/dL (ref <10)

## 2022-11-21 LAB — PROTIME-INR
INR: 1.2 (ref 0.8–1.2)
Prothrombin Time: 15 seconds (ref 11.4–15.2)

## 2022-11-21 MED ORDER — STROKE: EARLY STAGES OF RECOVERY BOOK: Freq: Once | Status: AC

## 2022-11-21 MED ORDER — TENECTEPLASE FOR STROKE: 25.0000 mg | PACK | Freq: Once | INTRAVENOUS | Status: AC

## 2022-11-21 MED ORDER — ACETAMINOPHEN 160 MG/5ML PO SOLN: 650.0000 mg | ORAL | Status: DC | PRN

## 2022-11-21 MED ORDER — CLEVIDIPINE BUTYRATE 0.5 MG/ML IV EMUL: 0.0000 mg/h | INTRAVENOUS | Status: DC

## 2022-11-21 MED ORDER — IOHEXOL 350 MG/ML SOLN
75.0000 mL | Freq: Once | INTRAVENOUS | Status: AC | PRN
  Administered 2022-11-21: 75 mL via INTRAVENOUS

## 2022-11-21 MED ORDER — LABETALOL HCL 5 MG/ML IV SOLN: 20.0000 mg | Freq: Once | INTRAVENOUS | Status: DC

## 2022-11-21 MED ORDER — PANTOPRAZOLE SODIUM 40 MG IV SOLR
40.0000 mg | Freq: Every day | INTRAVENOUS | Status: DC
  Administered 2022-11-22 (×2): 40 mg via INTRAVENOUS
  Filled 2022-11-21 (×2): qty 10

## 2022-11-21 MED ORDER — SENNOSIDES-DOCUSATE SODIUM 8.6-50 MG PO TABS: 1.0000 | ORAL_TABLET | Freq: Every evening | ORAL | Status: DC | PRN

## 2022-11-21 MED ORDER — ACETAMINOPHEN 325 MG PO TABS: 650.0000 mg | ORAL_TABLET | ORAL | Status: DC | PRN

## 2022-11-21 MED ORDER — ACETAMINOPHEN 650 MG RE SUPP: 650.0000 mg | RECTAL | Status: DC | PRN

## 2022-11-21 MED ORDER — TENECTEPLASE FOR STROKE
PACK | INTRAVENOUS | Status: AC
  Administered 2022-11-21: 25 mg via INTRAVENOUS
  Filled 2022-11-21: qty 10

## 2022-11-21 MED ORDER — SODIUM CHLORIDE 0.9 % IV SOLN: INTRAVENOUS | Status: DC

## 2022-11-21 NOTE — Progress Notes (Addendum)
2157 - Code Stroke Activated  LKWT 1830, mRS 3, initially reported as bilateral numbness now states that it is just the left arm sensation loss 2206 - Patient to CT  2214 - Tele-Neurologist paged 2218 - Dr. Lynnda Child on stroke cart  2218 - Patient returned from CT  2230 - TNK pulled/ order placed 2231 - CT head results given to Dr. Imogene Burn on camera 2234 - Bedside working to get 2nd IV  2239 - TNK given  2246 - Patient to CT for advanced imaging  2352 - CTA results sent to Dr. Lynnda Child

## 2022-11-21 NOTE — ED Notes (Signed)
Pt in CT at this time.

## 2022-11-21 NOTE — Consult Note (Signed)
[facility] Tele[facility]rology Consult Services   Patient Name:   Christina Rubio, Christina Rubio Date of Birth:   03-Dec-1950 Identification Number:   MRN - [id number] Date of Service:   11/21/2022 22:14:06  Diagnosis:       I63.89 - Cerebrovascular accident (CVA) due to other mechanism [facility])  Impression:      The patient presents with L arm weakness and loss of sensation in her entire L side. Acute ischemic stroke is suspected. There is drift in her LUE.   She is left handed and she feels the symptoms are disabling. She does have h/o spinal stenosis, neuropathy, and carpal tunnel which can complicate the picture. Given the patient's current disabling deficits, we recommended the use of IV thrombolytic and had detailed discussion with the patient regarding risks including bleeding, allergic reaction, risk of needing surgery, blood transfusions, or loss of limb function, and other unforeseen adverse effects. Contraindications were reviewed in detail and patient met criteria for thrombolytic. We reviewed that the risk of bleeding is estimated at 3-6% and could even be fatal. The patient was in agreement with receiving the medication. A timeout was performed and the patient received the TNK injection without complication. Stat CTA is ordered to rule out LVO to assess for eligibility for thrombectomy. Otherwise, will admit for post TNK stroke care and further workup.  Our recommendations are outlined below. Recommendations: IV Tenecteplase recommended.  I confirmed the following. (Patient name, DOB, MRN, Blood Pressure, dose of Thrombolytic and waste, weight completed by stretcher/scale not stated weight, have ED staff inform ED MD of thrombolytic decision) Thrombolytic bolus given Without Complication.   IV Tenecteplase Total Dose - 25.0 mg   Routine post Thrombolytic monitoring including neuro checks and blood pressure control during/after treatment Monitor blood pressure Check blood  pressure and neuro assessment every 15 min for 2 h, then every 30 min for 6 h, and finally every hour for 16 h.  Manage Blood Pressure per post Thrombolytic protocol.        Follow designated hospital protocol for admission and post thrombolytic care       CT brain 24 hours post Thrombolytic       NPO until swallowing screen performed and passed       No antiplatelet agents or anticoagulants (including heparin for DVT prophylaxis) in first 24 hours       No Foley catheter, nasogastric tube, arterial catheter or central venous catheter for 24 hr, unless absolutely necessary       Telemetry       Bedside swallow evaluation       HOB less than 30 degrees       Euglycemia       Avoid hyperthermia, PRN acetaminophen       DVT prophylaxis       Inpatient Neurology Consultation       Stroke evaluation as per inpatient neurology recommendations  Discussed with ED physician  ADDENDUM:  Advanced Imaging: CTA Head and Neck Completed.  LVO:No  Patient is not eligible for NIR consideration. Proceed with plan as discussed.  ------------------------------------------------------------------------------  Advanced Imaging: Advanced imaging has been ordered. Results pending.   Metrics: Last Known Well: 11/21/2022 18:30:00 [facility] Notification Time: 11/21/2022 22:14:06 Arrival Time: 11/21/2022 20:18:00 Stamp Time: 11/21/2022 22:14:06 Initial Response Time: 11/21/2022 22:18:18 Symptoms: L arm weakness and L side numbness. Initial patient interaction: 11/21/2022 22:20:00 NIHSS Assessment Completed: 11/21/2022 22:21:53 Patient is a candidate for Thrombolytic. Thrombolytic Medical Decision: 11/21/2022 22:25:58 Needle Time: 11/21/2022 22:39:58 Weight Noted  by Staff: 160.2 kg  CT head showed no acute hemorrhage or acute core infarct.  Primary Provider Notified of Diagnostic Impression and Management Plan on: 11/21/2022  22:45:15    ------------------------------------------------------------------------------  Thrombolytic Contraindications:  Last Known Well > 4.5 hours: No CT Head showing hemorrhage: No Ischemic stroke within 3 months: No Severe head trauma within 3 months: No Intracranial/intraspinal surgery within 3 months: No History of intracranial hemorrhage: No Symptoms and signs consistent with an SAH: No GI malignancy or GI bleed within 21 days: No Coagulopathy: Platelets <100 000 /mm3, INR >1.7, aPTT>40 s, or PT >15 s: No Treatment dose of LMWH within the previous 24 hrs: No Use of NOACs in past 48 hours: No Glycoprotein IIb/IIIa receptor inhibitors use: No Symptoms consistent with infective endocarditis: No Suspected aortic arch dissection: No Intra-axial intracranial neoplasm: No  Thrombolytic Decision and Management Plan: Management with thrombolytic treatment was explained to the Patient as was risks and benefits and alternatives to the treatment. Patient agrees with the decision to proceed with thrombolytic treatment. . All questions were answered and the Patient expressed understanding of the treatment plan.   History of Present Illness: Patient is a 72 year old Female.  Patient was brought by EMS for symptoms of L arm weakness and L side numbness. Patient presents with L side deficits. She was repositioning her R leg as she usually does and then she tried to hold on to her bed but her L arm was not working, initially patient was saying she was numb on both sides but then her L side is numb. She is weak on the L side. She has carpal tunnel issues with some weakness but this is worse and she cannot feel her L face, arm or leg. She denies any headache.   Past Medical History:      Hypertension      Hyperlipidemia Other PMH:  hypothryoid  spinal stenosis (uses walker)  neuropathy  Medications:  No Anticoagulant use  No Antiplatelet use Reviewed EMR for current  medications  Allergies:  Reviewed  Social History: Smoking: No Alcohol Use: No  Family History:  There is no family history of premature cerebrovascular disease pertinent to this consultation  ROS : 14 Points Review of Systems was performed and was negative except mentioned in HPI.  Past Surgical History: There Is No Surgical History Contributory To Today's Visit   Examination: BP(158/84), Pulse(56), Blood Glucose(121) 1A: Level of Consciousness - Alert; keenly responsive + 0 1B: Ask Month and Age - Both Questions Right + 0 1C: Blink Eyes & Squeeze Hands - Performs Both Tasks + 0 2: Test Horizontal Extraocular Movements - Normal + 0 3: Test Visual Fields - No Visual Loss + 0 4: Test Facial Palsy (Use Grimace if Obtunded) - Normal symmetry + 0 5A: Test Left Arm Motor Drift - Drift, but doesn't hit bed + 1 5B: Test Right Arm Motor Drift - No Drift for 10 Seconds + 0 6A: Test Left Leg Motor Drift - No Drift for 5 Seconds + 0 6B: Test Right Leg Motor Drift - No Drift for 5 Seconds + 0 7: Test Limb Ataxia (FNF/Heel-Shin) - No Ataxia + 0 8: Test Sensation - Complete Loss: Cannot Sense Being Touched At All + 2 9: Test Language/Aphasia - Normal; No aphasia + 0 10: Test Dysarthria - Normal + 0 11: Test Extinction/Inattention - No abnormality + 0  NIHSS Score: 3  Pre-Morbid Modified Rankin Scale: 3 Points = Moderate disability; requiring some help,  but able to walk without assistance  Spoke with : Dr Lenard Lance  This consult was conducted in real time using interactive audio and Immunologist. Patient was informed of the technology being used for this visit and agreed to proceed. Patient located in hospital and provider located at home/office setting.   Patient is being evaluated for possible acute neurologic impairment and high probability of imminent or life-threatening deterioration. I spent total of 45 minutes providing care to this patient, including time for face to  face visit via telemedicine, review of medical records, imaging studies and discussion of findings with providers, the patient and/or family.   Dr Lynnda Child   [facility] For Inpatient follow-up with [facility] physician please call [facility] [phone]. This is not an outpatient service. Post hospital discharge, please contact hospital directly.  Please do not communicate with [facility] physicians via secure chat. If you have any questions, Please contact [facility]. Please call or reconsult our service if there are any clinical or diagnostic changes.

## 2022-11-21 NOTE — ED Notes (Signed)
This RN brought pt to triage 2 to check pedal pulse as pt's feet looked purple in color, pt informed this RN that her left arm feels numb and is not able to control it, reports it started tonight and is a new symptom for her. Pt reports on daily basis she is able to use a walker and ambulate with it. RN called charged nurse pt roomed to room 14. Pt moved from chair to stretcher with assistance of several nurses and ED techs.

## 2022-11-21 NOTE — Progress Notes (Signed)
   11/21/22 2200  Spiritual Encounters  Type of Visit Initial  Care provided to: Patient  Conversation partners present during encounter Nurse  Referral source Code page  Reason for visit Code  OnCall Visit Yes  Spiritual Framework  Presenting Themes Courage hope and growth   Chaplain received a CODE Stroke and went to check on the patient. The nurse was about to take the patient to get a CT scan when I came in the room. I introduced myself to the patient and she asked me to say a lot of prayers for her. Patient also expressed her concern about her 54 yr old grandson that she takes care of. Chaplain asked the patient did she have any family coming to visit her and she said no and shared that her daughter died recently. Chaplain will follow up with patient.

## 2022-11-21 NOTE — ED Notes (Signed)
called to carelink to activate code stroke per MD Paduchowski/rep:sally

## 2022-11-21 NOTE — ED Provider Notes (Signed)
[facility] Provider Note    Event Date/Time   First MD Initiated Contact with Patient 11/21/22 2149     (approximate)  History   Chief Complaint: Nausea  HPI  Christina Rubio is a 72 y.o. female with a past medical history of anxiety, hypertension, hyperlipidemia, spinal stenosis, presents to the emergency department for acute onset of left upper extremity weakness.  Cording to the patient around 7:30 PM she had acute onset of left upper extremity weakness.  States it felt like it was jerking and since then she has not been able to control her left upper extremity has minimal sensation in the left upper extremity.  Patient denies any symptoms similar to this ever in her past, no history of TIA or CVA.  Patient denies any headache.  Denies any weakness of either leg.  Physical Exam   Triage Vital Signs: ED Triage Vitals  Encounter Vitals Group     BP 11/21/22 2031 (!) 159/84     Systolic BP Percentile --      Diastolic BP Percentile --      Pulse Rate 11/21/22 2031 (!) 46     Resp 11/21/22 2031 18     Temp 11/21/22 2031 97.9 F (36.6 C)     Temp Source 11/21/22 2031 Oral     SpO2 11/21/22 2031 96 %     Weight --      Height --      Head Circumference --      Peak Flow --      Pain Score 11/21/22 2034 0     Pain Loc --      Pain Education --      Exclude from Growth Chart --     Most recent vital signs: Vitals:   11/21/22 2031  BP: (!) 159/84  Pulse: (!) 46  Resp: 18  Temp: 97.9 F (36.6 C)  SpO2: 96%    General: Awake, no distress.  CV:  Good peripheral perfusion.  Regular rate and rhythm  Resp:  Normal effort.  Equal breath sounds bilaterally.  Abd:  No distention.  Soft, nontender.  Obese. Other:  Patient has good grip strength in bilateral upper extremities however she is not able to lift her left upper extremity up against gravity, when placed up she is not able to maintain any effort against gravity.  States no sensation in the left  arm.  Sensation intact in all other extremities.  Patient able to lift both of her lower extremities off the bed, both lower extremities are somewhat discolored most consistent with chronic venous stasis, 2+ DP pulses bilaterally.  Cranial nerves appear intact no facial droop identified.   ED Results / Procedures / Treatments   EKG  EKG viewed and interpreted by myself shows normal sinus rhythm 53 bpm with a narrow QRS, normal axis, normal intervals, nonspecific ST changes.  RADIOLOGY  I have reviewed and the CT head images.  No bleed or significant abnormality to my evaluation. Radiology is read the CT scan is negative   MEDICATIONS ORDERED IN ED: Medications - No data to display   IMPRESSION / MDM / ASSESSMENT AND PLAN / ED COURSE  I reviewed the triage vital signs and the nursing notes.  Patient's presentation is most consistent with acute presentation with potential threat to life or bodily function.  Patient presents emergency department for acute onset of left upper extremity weakness starting at 7:30 PM on examination patient has significant weakness in  the left upper extremity unable to maintain any effort against gravity in the left upper extremity.  States no sensation are very diminished in the left upper extremity.  Cranial nerves appear intact, lower extremities appear intact.  I have activated a code stroke given last known normal at 7:30 PM we will have teleneurology evaluate.  Patient's lab work is largely within normal limits including a normal CBC, reassuring chemistry and a negative troponin.  Lipase slightly elevated at 52 but likely unrelated to today's presentation.  Neurology evaluation pending CT scan pending.  CT scan does not appear to show any acute abnormality.  Neurology has seen the patient and is recommending TN K they are placing the order.  Will admit to the ICU for further workup and treatment.  NIH Stroke Scale   Interval: Baseline Time: 10:34  PM Person Administering Scale: Minna Antis  Administer stroke scale items in the order listed. Record performance in each category after each subscale exam. Do not go back and change scores. Follow directions provided for each exam technique. Scores should reflect what the patient does, not what the clinician thinks the patient can do. The clinician should record answers while administering the exam and work quickly. Except where indicated, the patient should not be coached (i.e., repeated requests to patient to make a special effort).   1a  Level of consciousness: 0=alert; keenly responsive  1b. LOC questions:  0=Performs both tasks correctly  1c. LOC commands: 0=Performs both tasks correctly  2.  Best Gaze: 0=normal  3.  Visual: 0=No visual loss  4. Facial Palsy: 0=Normal symmetric movement  5a.  Motor left arm: 2=Some effort against gravity, limb cannot get to or maintain (if cured) 90 (or 45) degrees, drifts down to bed, but has some effort against gravity  5b.  Motor right arm: 0=No drift, limb holds 90 (or 45) degrees for full 10 seconds  6a. motor left leg: 0=No drift, limb holds 90 (or 45) degrees for full 10 seconds  6b  Motor right leg:  0=No drift, limb holds 90 (or 45) degrees for full 10 seconds  7. Limb Ataxia: 1=Present in one limb  8.  Sensory: 2=Severe to total sensory loss; patient is not aware of being touched in face, arm, leg  9. Best Language:  0=No aphasia, normal  10. Dysarthria: 0=Normal  11. Extinction and Inattention: 0=No abnormality  12. Distal motor function: 1=At least some extension after 5 seconds, but is not fully extended. Any movement of the fingers which is not in response to a command is not scored   Total:   6    CRITICAL CARE Performed by: Minna Antis   Total critical care time: 30 minutes  Critical care time was exclusive of separately billable procedures and treating other patients.  Critical care was necessary to treat or prevent  imminent or life-threatening deterioration.  Critical care was time spent personally by me on the following activities: development of treatment plan with patient and/or surrogate as well as nursing, discussions with consultants, evaluation of patient's response to treatment, examination of patient, obtaining history from patient or surrogate, ordering and performing treatments and interventions, ordering and review of laboratory studies, ordering and review of radiographic studies, pulse oximetry and re-evaluation of patient's condition.   FINAL CLINICAL IMPRESSION(S) / ED DIAGNOSES   Left upper extremity weakness   Note:  This document was prepared using Dragon voice recognition software and may include unintentional dictation errors.   Minna Antis, MD 11/21/22 2235

## 2022-11-21 NOTE — Progress Notes (Signed)
Pt had 70ml omni 350 extravasation in left AC, Dr. Lenard Lance assessed pt. IV was removed, arm was elevated, ice was placed at site, verbal instructions were given to pt, verbal handoff was given to RN Reonna, and extravasation orders were placed.

## 2022-11-21 NOTE — ED Notes (Signed)
Dr. Imogene Burn has begun assessing patient

## 2022-11-21 NOTE — ED Triage Notes (Addendum)
Pt c/o extreme nausea after eating french fries around 7 pm today. Pt denies abdominal pain, SHOB, CP, dizziness. Belching noted. Pt c/o numbness in upper extremities bilaterally, strength equal bilaterally. Belching noted.  138/90 122-BGL

## 2022-11-22 ENCOUNTER — Inpatient Hospital Stay: Payer: Medicare Other

## 2022-11-22 ENCOUNTER — Encounter: Payer: Self-pay | Admitting: Internal Medicine

## 2022-11-22 ENCOUNTER — Inpatient Hospital Stay (HOSPITAL_COMMUNITY)
Admit: 2022-11-22 | Discharge: 2022-11-22 | Disposition: A | Discharge status: Home / Self Care | Payer: Medicare Other | Attending: Nurse Practitioner | Admitting: Nurse Practitioner

## 2022-11-22 DIAGNOSIS — I6389 Other cerebral infarction: Secondary | ICD-10-CM | POA: Diagnosis not present

## 2022-11-22 DIAGNOSIS — I639 Cerebral infarction, unspecified: Secondary | ICD-10-CM | POA: Diagnosis not present

## 2022-11-22 LAB — LIPID PANEL
Cholesterol: 125 mg/dL (ref 0–200)
HDL: 39 mg/dL — ABNORMAL LOW (ref >40)
LDL Cholesterol: 69 mg/dL (ref 0–99)
Total CHOL/HDL Ratio: 3.2 ratio
Triglycerides: 84 mg/dL (ref <150)
VLDL: 17 mg/dL (ref 0–40)

## 2022-11-22 LAB — ECHOCARDIOGRAM COMPLETE
AR max vel: 2.27 cm2
AV Peak grad: 10.2 mmHg
Ao pk vel: 1.6 m/s
Area-P 1/2: 2.55 cm2
Calc EF: 73 %
Height: 64 in
S' Lateral: 3 cm
Single Plane A2C EF: 74.8 %
Single Plane A4C EF: 70.7 %
Weight: 5650.83 [oz_av]

## 2022-11-22 LAB — GLUCOSE, CAPILLARY
Glucose-Capillary: 123 mg/dL — ABNORMAL HIGH (ref 70–99)
Glucose-Capillary: 81 mg/dL (ref 70–99)
Glucose-Capillary: 98 mg/dL (ref 70–99)

## 2022-11-22 LAB — CBG MONITORING, ED: Glucose-Capillary: 147 mg/dL — ABNORMAL HIGH (ref 70–99)

## 2022-11-22 MED ORDER — PERFLUTREN LIPID MICROSPHERE
1.0000 mL | INTRAVENOUS | Status: AC | PRN
  Administered 2022-11-22: 4 mL via INTRAVENOUS

## 2022-11-22 MED ORDER — CHLORHEXIDINE GLUCONATE CLOTH 2 % EX PADS
6.0000 | MEDICATED_PAD | Freq: Every day | CUTANEOUS | Status: DC
  Administered 2022-11-22 – 2022-11-24 (×4): 6 via TOPICAL

## 2022-11-22 MED ORDER — ORAL CARE MOUTH RINSE: 15.0000 mL | OROMUCOSAL | Status: DC | PRN

## 2022-11-22 NOTE — H&P (Signed)
NAME:  Christina Rubio, MRN:  [id number], DOB:  12-05-1950, LOS: 1 ADMISSION DATE:  11/21/2022, CONSULTATION DATE:  11/21/22 REFERRING MD:  Minna Antis CHIEF COMPLAINT:  Left sided weakness and numbness   HPI  72 y.o female with significant PMH of T2DM, Anxiety and Depression, Thyroid Disease s/p partial thyroidectomy, HTN, HLD, Chronic Bilateral Lymphedema, Morbid Obesitywho presented to the ED with chief complaints of  sudden onset Left arm weakness and numbness  since 7pm.   ED Course: On arrival to the ED, Code stroke was activated at 21:57.Initial vital signs showed HR of beats/minute, BP mm Hg, the RR 30 breaths/minute, and the oxygen saturation % on and a temperature of 98.18F (36.9C). Patient was taken for STAT CT head and upon returning from CT she was evaluated by tele-neurologist who deemed her a candidate for TNK.  LKW: 1830 mRS: 3 tNKASE: Administered at 2239 CTH/CTA: Negative Labs/Diagnostics Findings:Na+/ K+: 132/3.2 Glucose: 121 BUN/Cr.:20/1.10   PCCM consulted to admit post thrombolytics.  Past Medical History  T2DM Anxiety and Depression Thyroid Disease s/p partial thyroidectomy HTN HLD Chronic Bilateral Lymphedema Morbid Obesity  Significant Hospital Events   8/30: Admitted to ICU with Acute CVA s/p TNK  Consults:  Neurology  Procedures:  none  Significant Diagnostic Tests:  8/30: CTA Head>IMPRESSION: 1. No emergent large vessel occlusion or high-grade stenosis of the intracranial arteries. 8/30: Noncontrast CT head>No acute intracranial abnormality.  8/30: MRI Brain: pending   Interim History / Subjective:    None  Micro Data:  None  Antimicrobials:  None  OBJECTIVE  Blood pressure 124/68, pulse (!) 54, temperature 97.8 F (36.6 C), temperature source Oral, resp. rate 15, height 5\' 4"  (1.626 m), weight (!) 160.2 kg, SpO2 99%.       No intake or output data in the 24 hours ending 11/22/22 0412 Filed Weights   11/21/22 2203   Weight: (!) 160.2 kg     Physical Examination  GENERAL: 72 year-old critically ill patient lying in the bed in no acute distress EYES: PEERLA. No scleral icterus. Extraocular muscles intact.  HEENT: Head atraumatic, normocephalic. Oropharynx and nasopharynx clear.  NECK:  No JVD, supple  LUNGS: Normal breath sounds bilaterally.  No use of accessory muscles of respiration.  CARDIOVASCULAR: S1, S2 normal. No murmurs, rubs, or gallops.  ABDOMEN: Soft, NTND EXTREMITIES: Bilateral lower chronic lymphedema.  Capillary refill < 3 seconds in all extremities. Pulses palpable distally. NEUROLOGIC: The patient is alert and oriented x 4. Decreased sensation in the left arm, strength 4/5 otherwise No new or focal neurological deficit appreciated.Cranial nerves are intact.  SKIN: as below  Labs/imaging that I havepersonally reviewed  (right click and "Reselect all SmartList Selections" daily)     Labs   CBC: Recent Labs  Lab 11/21/22 2037 11/21/22 2155  WBC 8.7 9.8  NEUTROABS  --  7.3  HGB 13.2 13.8  HCT 40.9 42.5  MCV 86.3 86.7  PLT 271 280    Basic Metabolic Panel: Recent Labs  Lab 11/21/22 2037  NA 132*  K 3.2*  CL 99  CO2 22  GLUCOSE 121*  BUN 20  CREATININE 1.10*  CALCIUM 9.0   GFR: Estimated Creatinine Clearance: 71.8 mL/min (A) (by C-G formula based on SCr of 1.1 mg/dL (H)). Recent Labs  Lab 11/21/22 2037 11/21/22 2155  WBC 8.7 9.8    Liver Function Tests: Recent Labs  Lab 11/21/22 2037  AST 16  ALT 10  ALKPHOS 50  BILITOT 0.7  PROT 7.9  ALBUMIN 3.9   Recent Labs  Lab 11/21/22 2037  LIPASE 52*   No results for input(s): "AMMONIA" in the last 168 hours.  ABG No results found for: "PHART", "PCO2ART", "PO2ART", "HCO3", "TCO2", "ACIDBASEDEF", "O2SAT"   Coagulation Profile: Recent Labs  Lab 11/21/22 2155  INR 1.2    Cardiac Enzymes: No results for input(s): "CKTOTAL", "CKMB", "CKMBINDEX", "TROPONINI" in the last 168 hours.  HbA1C: Hgb A1c  MFr Bld  Date/Time Value Ref Range Status  11/11/2022 11:24 AM 5.9 (H) 4.8 - 5.6 % Final    Comment:             Prediabetes: 5.7 - 6.4          Diabetes: >6.4          Glycemic control for adults with diabetes: <7.0   08/08/2021 10:47 AM 6.2 (H) 4.8 - 5.6 % Final    Comment:             Prediabetes: 5.7 - 6.4          Diabetes: >6.4          Glycemic control for adults with diabetes: <7.0     CBG: Recent Labs  Lab 11/21/22 2153 11/22/22 0050  GLUCAP 147* 123*    Review of Systems:   Review of Systems  Constitutional: Negative.   HENT: Negative.    Eyes: Negative.   Respiratory: Negative.    Cardiovascular:  Positive for leg swelling.  Skin: Negative.   Neurological:  Positive for sensory change, focal weakness and weakness.  Endo/Heme/Allergies: Negative.   Psychiatric/Behavioral:  Positive for depression. The patient is nervous/anxious.    Past Medical History  She,  has a past medical history of Anxiety, Arthritis, Depression, Hyperlipidemia, Hypertension, Neuromuscular disorder (HCC), Osteoporosis, PONV (postoperative nausea and vomiting), Skin cancer, Spinal stenosis, and Thyroid disease.   Surgical History    Past Surgical History:  Procedure Laterality Date   CESAREAN SECTION     CHOLECYSTECTOMY     COLON SURGERY     FEMUR IM NAIL Right 01/09/2016   Procedure: INTRAMEDULLARY (IM) RETROGRADE FEMORAL NAILING RIGHT DISTAL FEMUR;  Surgeon: Jodi Geralds, MD;  Location: [facility] OR;  Service: Orthopedics;  Laterality: Right;   LAPAROSCOPIC GASTRIC SLEEVE RESECTION     Right thyroidectomy     TONSILLECTOMY       Social History   reports that she has never smoked. She has never used smokeless tobacco. She reports current alcohol use. She reports that she does not use drugs.   Family History   Her family history includes Anemia in her daughter; Depression in her daughter; Emphysema in her paternal grandfather; Healthy in her brother, sister, and sister; Lung cancer in  her mother and paternal uncle; Prostate cancer in her father; Stroke in her maternal grandfather, maternal grandmother, and paternal grandmother; Thyroid cancer in her maternal uncle. There is no history of Colon cancer, Breast cancer, Ovarian cancer, or Cervical cancer.   Allergies Allergies  Allergen Reactions   Shellfish Allergy     Patient reported.   Epinephrine Palpitations    faint   Penicillins Rash    Has patient had a PCN reaction causing immediate rash, facial/tongue/throat swelling, SOB or lightheadedness with hypotension:  Yes Has patient had a PCN reaction causing severe rash involving mucus membranes or skin necrosis: No Has patient had a PCN reaction that required hospitalization: No Has patient had a PCN reaction occurring within the last 10 years: No If all of the  above answers are "NO", then may proceed with Cephalosporin use.;   Strawberry Extract Rash     Home Medications  Prior to Admission medications   Medication Sig Start Date End Date Taking? Authorizing Provider  ALPRAZolam (XANAX) 0.5 MG tablet TAKE 1 TABLET BY MOUTH EVERY DAY AS NEEDED FOR ANXIETY. 08/04/22   Erasmo Downer, MD  atorvastatin (LIPITOR) 20 MG tablet TAKE 1 TABLET BY MOUTH DAILY 04/17/22   Erasmo Downer, MD  azelastine (ASTELIN) 0.1 % nasal spray Place 1 spray into both nostrils 2 (two) times daily. Use in each nostril as directed 07/16/22   Drubel, Lillia Abed, PA-C  blood glucose meter kit and supplies Dispense based on patient and insurance preference. Use once daily to check for fasting blood sugars. (FOR ICD-10 E10.9, E11.9). 05/24/20   Erasmo Downer, MD  Glucose Blood (BLOOD GLUCOSE TEST STRIPS 333) STRP Dispense based on patient and insurance preference. Use once daily to check for fasting blood sugars. (ICD-10 E11.69) 04/25/22   Erasmo Downer, MD  levothyroxine (SYNTHROID) 112 MCG tablet TAKE 1 TABLET EVERY DAY ON EMPTY STOMACHWITH A GLASS OF WATER AT LEAST 30-60 MINBEFORE  BREAKFAST 02/04/22   Bacigalupo, Marzella Schlein, MD  loperamide (IMODIUM A-D) 2 MG tablet Take 1 tablet (2 mg total) by mouth 4 (four) times daily as needed for diarrhea or loose stools. 08/03/20   Bacigalupo, Marzella Schlein, MD  metoprolol succinate (TOPROL-XL) 50 MG 24 hr tablet TAKE 1 TABLET BY MOUTH DAILY WITH OR IMMEDIATELY FOLLOWING A MEAL 04/23/22   Bacigalupo, Marzella Schlein, MD  metroNIDAZOLE (FLAGYL) 500 MG tablet Take 1 tablet (500 mg total) by mouth 2 (two) times daily. 05/20/22   Erasmo Downer, MD  Multiple Vitamins-Minerals (PRESERVISION AREDS 2) CAPS Take 1 tablet by mouth 2 (two) times daily.    [provider]  Probiotic Product (PROBIOTIC PO) Take by mouth daily.    [provider]  sertraline (ZOLOFT) 100 MG tablet TAKE 2 TABLETS BY MOUTH DAILY WITH BREAKFAST 06/19/22   Bacigalupo, Marzella Schlein, MD  valsartan-hydrochlorothiazide (DIOVAN-HCT) 160-25 MG tablet TAKE 1 TABLET BY MOUTH NIGHTLY 04/23/22   Bacigalupo, Marzella Schlein, MD  Scheduled Meds:   stroke: early stages of recovery book   Does not apply Once   pantoprazole (PROTONIX) IV  40 mg Intravenous QHS   Continuous Infusions:  sodium chloride 100 mL/hr at 11/22/22 0102   PRN Meds:.acetaminophen **OR** acetaminophen (TYLENOL) oral liquid 160 mg/5 mL **OR** acetaminophen, senna-docusate   Active Hospital Problem list   See systems below  Assessment & Plan:  #Acute CVA Presenting Left sided weakness and numbness -  CT head/CTA no acute process or LVO. TNK administered @2239  - Vital signs/blood pressure with neuro checks/NIHSS  per protocol for the first 24 hours  - No lines, catheters, antiplatelet or anticoagulants x24 hr after IV tPA unless required  - keep BP <185/100 with short-acting antihypertensives (Labetolol, Hydralazine or Nicardipine Gtt as needed).  -MRI/MRA brain without contrast per stroke protocol -TTE to r/o cardioembolic source, assess EF  -check fasting Lipid Panel with Direct LDL in AM.  -check HgA1c,  TSH  -Will repeat head CT in 24 hours s/p IV Alteplase per protocol. -Place SCDs for DVT prevention. -NPO for now until passes swallow screen  -PT consult, OT consult,-Aspiration precautions, Bleeding precautions -Obtain STAT head CT for any new acute headache or new neurological deficits -Neurology consult.   # AKI likely ATN in the setting of above # Hypokalemia -Monitor I&O's /  urinary output -Follow BMP -Ensure adequate renal perfusion -Avoid nephrotoxic agents as able -Replace electrolytes as indicated   #Hypertension #Hyperlipidemia -Permissive hypertension  per neuro recs -Hold BP meds for now -Atorvastatin 20mg  PO qhs  #T2DM -HgbA1c pending -CBG's q4; Target range of 140 to 180 -SSI -Follow ICU Hypo/Hyperglycemia protocol  #Thyroid Disease s/p partial thyroidectomy  -Continue Synthroid  #Anxiety and Depression -Continue Alprazolam and Zoloft once able to tolerate po   Best practice:  Diet:  NPO Pain/Anxiety/Delirium protocol (if indicated): No VAP protocol (if indicated): Not indicated DVT prophylaxis: Contraindicated GI prophylaxis: N/A Glucose control:  SSI Yes Central venous access:  N/A Arterial line:  N/A Foley:  N/A Mobility:  bed rest  PT consulted: Yes Last date of multidisciplinary goals of care discussion [8/30] Code Status:  full code Disposition:ICU   = Goals of Care = Code Status Order: FULL  Primary Emergency Contact: Noah,Dalton Wishes to pursue full aggressive treatment and intervention options, including CPR and intubation, but goals of care will be addressed on going with family if that should become necessary. .  Critical care time: 45 minutes        Webb Silversmith DNP, CCRN, FNP-C, AGACNP-BC Acute Care & Family Nurse Practitioner  Pulmonary & Critical Care Medicine PCCM on call pager [phone]

## 2022-11-22 NOTE — Progress Notes (Signed)
  Echocardiogram 2D Echocardiogram has been performed. Definity IV ultrasound imaging agent used on this study.  Lenor Coffin 11/22/2022, 9:33 AM

## 2022-11-22 NOTE — Plan of Care (Signed)
  Problem: Education: Goal: Knowledge of disease or condition will improve Outcome: Progressing   Problem: Ischemic Stroke/TIA Tissue Perfusion: Goal: Complications of ischemic stroke/TIA will be minimized Outcome: Progressing   Problem: Coping: Goal: Will verbalize positive feelings about self Outcome: Progressing Goal: Will identify appropriate support needs Outcome: Progressing   Problem: Health Behavior/Discharge Planning: Goal: Ability to manage health-related needs will improve Outcome: Progressing   Problem: Self-Care: Goal: Ability to participate in self-care as condition permits will improve Outcome: Progressing Goal: Verbalization of feelings and concerns over difficulty with self-care will improve Outcome: Progressing Goal: Ability to communicate needs accurately will improve Outcome: Progressing   Problem: Education: Goal: Knowledge of General Education information will improve Description: Including pain rating scale, medication(s)/side effects and non-pharmacologic comfort measures Outcome: Progressing   Problem: Activity: Goal: Risk for activity intolerance will decrease Outcome: Progressing   Problem: Coping: Goal: Level of anxiety will decrease Outcome: Progressing

## 2022-11-22 NOTE — ED Notes (Signed)
Returned from MRI 

## 2022-11-22 NOTE — ED Notes (Signed)
57 - Transported to MRI

## 2022-11-22 NOTE — Progress Notes (Signed)
PT Cancellation Note  Patient Details Name: Christina Rubio MRN: [id number] DOB: October 18, 1950   Cancelled Treatment:    Reason Eval/Treat Not Completed: Medical issues which prohibited therapy. PT orders received and pt chart reviewed. Per chart review, pt received TNK at 2239 on 8/30. Per hospital protocol, PT to hold therapy intervention until 24 hours s/p TNK administration. Will follow up with PT evaluation tomorrow, as appropriate.   Vira Blanco, PT, DPT 8:27 AM,11/22/22 Physical Therapist - [facility]

## 2022-11-22 NOTE — Progress Notes (Signed)
OT Cancellation Note  Patient Details Name: Janilyn Gallon MRN: [id number] DOB: November 28, 1950   Cancelled Treatment:    Reason Eval/Treat Not Completed: Medical issues which prohibited therapy. OT orders received, chart reviewed. TNK was given on 2239 at 8/30. Per therapy protocol, pt on bed rest for 24 hours after TNK administration. Will re-attempt evaluation after follow up imaging has been completed and pt is cleared for activity.   Gerrie Nordmann 11/22/2022, 8:35 AM

## 2022-11-22 NOTE — Plan of Care (Signed)
  Problem: Education: Goal: Knowledge of disease or condition will improve Outcome: Progressing Goal: Knowledge of secondary prevention will improve (MUST DOCUMENT ALL) Outcome: Progressing Goal: Knowledge of patient specific risk factors will improve Loraine Leriche N/A or DELETE if not current risk factor) Outcome: Progressing   Problem: Ischemic Stroke/TIA Tissue Perfusion: Goal: Complications of ischemic stroke/TIA will be minimized Outcome: Progressing   Problem: Coping: Goal: Will verbalize positive feelings about self Outcome: Progressing Goal: Will identify appropriate support needs Outcome: Progressing   Problem: Health Behavior/Discharge Planning: Goal: Ability to manage health-related needs will improve Outcome: Progressing   Problem: Self-Care: Goal: Ability to participate in self-care as condition permits will improve Outcome: Progressing Goal: Verbalization of feelings and concerns over difficulty with self-care will improve Outcome: Progressing Goal: Ability to communicate needs accurately will improve Outcome: Progressing   Problem: Nutrition: Goal: Risk of aspiration will decrease Outcome: Progressing Goal: Dietary intake will improve Outcome: Progressing   Problem: Education: Goal: Knowledge of General Education information will improve Description: Including pain rating scale, medication(s)/side effects and non-pharmacologic comfort measures Outcome: Progressing   Problem: Health Behavior/Discharge Planning: Goal: Ability to manage health-related needs will improve Outcome: Progressing   Problem: Clinical Measurements: Goal: Ability to maintain clinical measurements within normal limits will improve Outcome: Progressing Goal: Will remain free from infection Outcome: Progressing Goal: Diagnostic test results will improve Outcome: Progressing Goal: Respiratory complications will improve Outcome: Progressing Goal: Cardiovascular complication will be  avoided Outcome: Progressing   Problem: Activity: Goal: Risk for activity intolerance will decrease Outcome: Progressing   Problem: Nutrition: Goal: Adequate nutrition will be maintained Outcome: Progressing   Problem: Coping: Goal: Level of anxiety will decrease Outcome: Progressing   Problem: Elimination: Goal: Will not experience complications related to bowel motility Outcome: Progressing Goal: Will not experience complications related to urinary retention Outcome: Progressing   Problem: Pain Managment: Goal: General experience of comfort will improve Outcome: Progressing   Problem: Safety: Goal: Ability to remain free from injury will improve Outcome: Progressing   Problem: Skin Integrity: Goal: Risk for impaired skin integrity will decrease Outcome: Progressing

## 2022-11-22 NOTE — ED Notes (Signed)
Symptoms improved.  Still feel numbness in L hand. "Tingling" BLE.  No complaints of facial numbness.  Patient states she in unable to fully control left hand.

## 2022-11-22 NOTE — Progress Notes (Signed)
eLink Physician-Brief Progress Note Patient Name: Christina Rubio DOB: [birth year] MRN: [id number]   Date of Service  11/22/2022  HPI/Events of Note  72/F with left arm weakness and loss of sendation in her left side, suspicion for acute ischemic stroke. Pt is now s/p IV tenecteplase.   eICU Interventions  Neuro checks q1 hour.  Allow for permissive hypertension.  Intervene only for BP >180/105. NPO until patient passes swallow evaluation.  Replete K.  Repeat CT head in 24 hours.  SCDs for DVT prophylaxis.      Intervention Category Evaluation Type: New Patient Evaluation  Larinda Buttery 11/22/2022, 1:00 AM

## 2022-11-22 NOTE — Progress Notes (Signed)
SLP Cancellation Note  Patient Details Name: Christina Rubio MRN: [id number] DOB: 08/05/1950   Cancelled treatment:       Reason Eval/Treat Not Completed: SLP screened, no needs identified, will sign off (chart reviewed; consulted NSG. Met w/ pt in room during her echocardiogram.)  Pt denied any difficulty swallowing and is currently on a regular diet; tolerates swallowing pills w/ water per NSG. Noted multiple cups of water on tray table from last night she said. She is eager to have breakfast post her test/scan completion.  Pt conversed in conversation w/ this SLP and Tech w/out expressive/receptive deficits noted; pt denied any speech-language deficits. Speech clear, fully intelligible(pt is missing anterior/upper Dentition) which can impact articulatory precision. She denied any change from her baseline. No further skilled ST services indicated as pt appears at her communication baseline. Pt agreed. NSG to reconsult if any change in status while admitted.       Jerilynn Som, MS, CCC-SLP Speech Language Pathologist Rehab Services; [facility] [phone] (ascom) Garris Melhorn 11/22/2022, 10:26 AM

## 2022-11-22 NOTE — Progress Notes (Signed)
Neurology progress note  S: Patient presented with L sensory deficit and hemineglect overnight and was given TNK by teleneurology. Both sensory deficit and neglect have improved but not resolved.  MRI brain shows R parietal infarct, personal review  CTA H&N unremarkable  Stroke Labs     Component Value Date/Time   CHOL 125 11/22/2022 0544   CHOL 168 11/11/2022 1124   TRIG 84 11/22/2022 0544   HDL 39 (L) 11/22/2022 0544   HDL 46 11/11/2022 1124   CHOLHDL 3.2 11/22/2022 0544   VLDL 17 11/22/2022 0544   LDLCALC 69 11/22/2022 0544   LDLCALC 90 11/11/2022 1124   LABVLDL 32 11/11/2022 1124    Lab Results  Component Value Date/Time   HGBA1C 5.9 (H) 11/11/2022 11:24 AM   TTE pending  Exam  Vitals:   11/22/22 1902 11/22/22 2000  BP: 126/62 135/84  Pulse: (!) 55 (!) 55  Resp: 14 18  Temp: 98.4 F (36.9 C)   SpO2: 99% 98%    Gen: patient lying in bed, NAD CV: extremities appear well-perfused Resp: normal WOB  Neurologic exam MS: alert, oriented x4, follows commands Speech: no dysarthria, no aphasia CN: PERRL, VFF, EOMI, sensation intact, face symmetric, hearing intact to voice Motor: 5/5 strength throughout Sensory: sensory deficit LUE, extinction to DSS LUE Reflexes: 2+ symm with toes down bilat Coordination: FNF intact bilat Gait: deferred  NIHSS = 2  A/P: 72 yo woman with hx DM2, HTN, HL p/w L sided sensory deficit and hemineglect s/p TNK. Sx have improved but not resolved. MRI brain shows R parietal infarct.  - Continue ICU monitoring overnight - Neurochecks and NIHSS documentation per post-TNK protocol - STAT head CT for any change in neurologic exam - Non-con head CT 24 hrs post-TNK r/o hemorrhagic conversion - TTE - no aspirin for 24 hours post TNK and until ICH ruled out by repeat head CT - keep SBP less than 180/105 for the 1st 24 hours post TNK to reduce the risk of hemorrhagic transformation - SCDs for DVT prophylaxis; can start SQ Heparin if head CT  24 hours post TNK is negative for ICH - PT/OT and speech therapy - stroke education - outpatient f/u with neurology after discharge  Will continue to follow.  Bing Neighbors, MD [facility] [phone]  If 7pm- 7am, please page neurology on call as listed in [facility]ION.

## 2022-11-22 NOTE — Plan of Care (Signed)
Pt is A&OX4. Scoring 1 on NIH now due to decreased sensation in L hand, but pt does report that it continues to improve. Denies pain. VSS, SB 47-55 on monitor. Rm air. Tolerating diet.

## 2022-11-22 NOTE — ED Notes (Signed)
Initial vitals after TNK given

## 2022-11-23 DIAGNOSIS — I639 Cerebral infarction, unspecified: Secondary | ICD-10-CM | POA: Diagnosis not present

## 2022-11-23 DIAGNOSIS — I480 Paroxysmal atrial fibrillation: Secondary | ICD-10-CM | POA: Insufficient documentation

## 2022-11-23 LAB — BASIC METABOLIC PANEL
Anion gap: 7 (ref 5–15)
BUN: 13 mg/dL (ref 8–23)
CO2: 23 mmol/L (ref 22–32)
Calcium: 8.7 mg/dL — ABNORMAL LOW (ref 8.9–10.3)
Chloride: 102 mmol/L (ref 98–111)
Creatinine, Ser: 0.93 mg/dL (ref 0.44–1.00)
GFR, Estimated: >60 mL/min (ref >60)
Glucose, Bld: 126 mg/dL — ABNORMAL HIGH (ref 70–99)
Potassium: 3.7 mmol/L (ref 3.5–5.1)
Sodium: 132 mmol/L — ABNORMAL LOW (ref 135–145)

## 2022-11-23 LAB — PHOSPHORUS: Phosphorus: 3.4 mg/dL (ref 2.5–4.6)

## 2022-11-23 LAB — GLUCOSE, CAPILLARY: Glucose-Capillary: 106 mg/dL — ABNORMAL HIGH (ref 70–99)

## 2022-11-23 LAB — MAGNESIUM: Magnesium: 2 mg/dL (ref 1.7–2.4)

## 2022-11-23 MED ORDER — ASPIRIN 81 MG PO TBEC
81.0000 mg | DELAYED_RELEASE_TABLET | Freq: Every day | ORAL | Status: DC
  Administered 2022-11-23 – 2022-11-24 (×2): 81 mg via ORAL
  Filled 2022-11-23 (×2): qty 1

## 2022-11-23 MED ORDER — ONDANSETRON HCL 4 MG/2ML IJ SOLN: 4.0000 mg | Freq: Four times a day (QID) | INTRAMUSCULAR | Status: DC | PRN

## 2022-11-23 MED ORDER — ENOXAPARIN SODIUM 80 MG/0.8ML IJ SOSY
0.5000 mg/kg | PREFILLED_SYRINGE | INTRAMUSCULAR | Status: DC
  Filled 2022-11-23: qty 0.8

## 2022-11-23 MED ORDER — ONDANSETRON HCL 4 MG PO TABS: 4.0000 mg | ORAL_TABLET | Freq: Three times a day (TID) | ORAL | Status: DC | PRN

## 2022-11-23 MED ORDER — ATORVASTATIN CALCIUM 20 MG PO TABS
40.0000 mg | ORAL_TABLET | Freq: Every day | ORAL | Status: DC
  Administered 2022-11-23 – 2022-11-24 (×2): 40 mg via ORAL
  Filled 2022-11-23 (×2): qty 2

## 2022-11-23 MED ORDER — PANTOPRAZOLE SODIUM 40 MG PO TBEC
40.0000 mg | DELAYED_RELEASE_TABLET | Freq: Every day | ORAL | Status: DC
  Administered 2022-11-23: 40 mg via ORAL
  Filled 2022-11-23: qty 1

## 2022-11-23 MED ORDER — POTASSIUM CHLORIDE CRYS ER 20 MEQ PO TBCR
40.0000 meq | EXTENDED_RELEASE_TABLET | Freq: Once | ORAL | Status: AC
  Administered 2022-11-23: 40 meq via ORAL
  Filled 2022-11-23: qty 2

## 2022-11-23 MED ORDER — ENOXAPARIN SODIUM 80 MG/0.8ML IJ SOSY
0.5000 mg/kg | PREFILLED_SYRINGE | INTRAMUSCULAR | Status: DC
  Administered 2022-11-23: 80 mg via SUBCUTANEOUS
  Filled 2022-11-23 (×2): qty 0.8

## 2022-11-23 MED ORDER — APIXABAN 5 MG PO TABS: 5.0000 mg | ORAL_TABLET | Freq: Two times a day (BID) | ORAL | Status: DC

## 2022-11-23 NOTE — Progress Notes (Addendum)
Progress Note    Christina Rubio  PPI:[id number] DOB: [birth year]  DOA: 11/21/2022 PCP: Erasmo Downer, MD      Brief Narrative:    Medical records reviewed and are as summarized below:  Christina Rubio is a 72 y.o. female with medical history significant for anxiety, depression, type 2 diabetes mellitus, morbid obesity, thyroid disease s/p partial thyroidectomy, hypertension, hyperlipidemia, bilateral lymphedema, who presented to the hospital because of sudden onset of left arm weakness and numbness.  Symptoms started around 7:30 PM.  Last known normal was on 11/21/2022 at around 6:30 PM.  She was evaluated by the tele neurologist and she was deemed to be a candidate for tPA for acute stroke.  She was given tPA and was admitted to the ICU for further management.  Cardiac monitoring revealed atrial fibrillation which was confirmed with twelve-lead EKG.          Assessment/Plan:   Principal Problem:   CVA (cerebral vascular accident) (HCC) Active Problems:   Hypertension associated with diabetes (HCC)   Paroxysmal atrial fibrillation (HCC)   Body mass index is 60.62 kg/m.  (Morbid obesity) This complicates overall care and prognosis   Acute stroke: S/p tPA (tenecteplase) on 11/21/2022 at 10:39 PM.  Case discussed with Dr. Selina Cooley, neurologist.  She recommended low-dose aspirin for now with plan to replace aspirin with Eliquis 5 days from date of admission (start Eliquis on 11/27/2022). PT and OT recommended discharge to SNF.  However, patient prefers to go home with home health therapy.  He understands she is at increased risk for falls.  Continue PT and OT for now.   Paroxysmal atrial fibrillation: Heart rate is normal but she had periods of bradycardia..  Patient will be started on Eliquis 5 days from day of admission.   Hypokalemia: Improved.  Continue potassium repletion.   Other comorbidities include hypertension, hyperlipidemia, type II DM (last hemoglobin A1c  was 5.9), anxiety, depression, hypothyroidism, CKD stage IIIa   Diet Order             Diet heart healthy/carb modified Room service appropriate? Yes; Fluid consistency: Thin  Diet effective now                            Consultants: Neurologist Intensivist  Procedures: None    Medications:    [START ON 11/27/2022] apixaban  5 mg Oral BID   aspirin EC  81 mg Oral Daily   Chlorhexidine Gluconate Cloth  6 each Topical Daily   enoxaparin (LOVENOX) injection  0.5 mg/kg Subcutaneous Q24H   pantoprazole  40 mg Oral QHS   Continuous Infusions:   Anti-infectives (From admission, onward)    None              Family Communication/Anticipated D/C date and plan/Code Status   DVT prophylaxis: SCD's Start: 11/21/22 2337 apixaban (ELIQUIS) tablet 5 mg     Code Status: Full Code  Family Communication: None Disposition Plan: Plan to discharge home tomorrow   Status is: Inpatient Remains inpatient appropriate because: Acute stroke       Subjective:   Interval events noted.  Numbness and weakness in the left upper extremity have improved.  No complaints.  No problems with vision, speech or confusion.  Objective:    Vitals:   11/23/22 0700 11/23/22 0730 11/23/22 0904 11/23/22 1153  BP: 126/75  116/69 (!) 141/90  Pulse: (!) 41 66 72 76  Resp: 14 15  16 18  Temp:  97.8 F (36.6 C) 97.7 F (36.5 C) 98 F (36.7 C)  TempSrc:  Oral Oral   SpO2: 96% 98% 98% 100%  Weight:      Height:       No data found.   Intake/Output Summary (Last 24 hours) at 11/23/2022 1545 Last data filed at 11/23/2022 0809 Gross per 24 hour  Intake 2189.71 ml  Output 1100 ml  Net 1089.71 ml   Filed Weights   11/21/22 2203  Weight: (!) 160.2 kg    Exam:  GEN: NAD SKIN: Warm and dry EYES: No pallor or icterus ENT: MMM CV: Irregular rate and rhythm PULM: CTA B ABD: soft, obese, NT, +BS CNS: AAO x 3, non focal EXT: Bilateral leg lymphedema       Data  Reviewed:   I have personally reviewed following labs and imaging studies:  Labs: Labs show the following:   Basic Metabolic Panel: Recent Labs  Lab 11/21/22 2037 11/23/22 0930  NA 132* 132*  K 3.2* 3.7  CL 99 102  CO2 22 23  GLUCOSE 121* 126*  BUN 20 13  CREATININE 1.10* 0.93  CALCIUM 9.0 8.7*  MG  --  2.0  PHOS  --  3.4   GFR Estimated Creatinine Clearance: 84.9 mL/min (by C-G formula based on SCr of 0.93 mg/dL). Liver Function Tests: Recent Labs  Lab 11/21/22 2037  AST 16  ALT 10  ALKPHOS 50  BILITOT 0.7  PROT 7.9  ALBUMIN 3.9   Recent Labs  Lab 11/21/22 2037  LIPASE 52*   No results for input(s): "AMMONIA" in the last 168 hours. Coagulation profile Recent Labs  Lab 11/21/22 2155  INR 1.2    CBC: Recent Labs  Lab 11/21/22 2037 11/21/22 2155  WBC 8.7 9.8  NEUTROABS  --  7.3  HGB 13.2 13.8  HCT 40.9 42.5  MCV 86.3 86.7  PLT 271 280   Cardiac Enzymes: No results for input(s): "CKTOTAL", "CKMB", "CKMBINDEX", "TROPONINI" in the last 168 hours. BNP (last 3 results) No results for input(s): "PROBNP" in the last 8760 hours. CBG: Recent Labs  Lab 11/21/22 2153 11/22/22 0050 11/22/22 2022 11/22/22 2340 11/23/22 0356  GLUCAP 147* 123* 98 81 106*   D-Dimer: No results for input(s): "DDIMER" in the last 72 hours. Hgb A1c: No results for input(s): "HGBA1C" in the last 72 hours. Lipid Profile: Recent Labs    11/22/22 0544  CHOL 125  HDL 39*  LDLCALC 69  TRIG 84  CHOLHDL 3.2   Thyroid function studies: No results for input(s): "TSH", "T4TOTAL", "T3FREE", "THYROIDAB" in the last 72 hours.  Invalid input(s): "FREET3" Anemia work up: No results for input(s): "VITAMINB12", "FOLATE", "FERRITIN", "TIBC", "IRON", "RETICCTPCT" in the last 72 hours. Sepsis Labs: Recent Labs  Lab 11/21/22 2037 11/21/22 2155  WBC 8.7 9.8    Microbiology No results found for this or any previous visit (from the past 240 hour(s)).  Procedures and  diagnostic studies:  CT HEAD WO CONTRAST ( )  Result Date: 11/23/2022 CLINICAL DATA:  Follow-up examination for stroke, status post T NK. EXAM: CT HEAD WITHOUT CONTRAST TECHNIQUE: Contiguous axial images were obtained from the base of the skull through the vertex without intravenous contrast. RADIATION DOSE REDUCTION: This exam was performed according to the departmental dose-optimization program which includes automated exposure control, adjustment of the mA and/or kV according to patient size and/or use of iterative reconstruction technique. COMPARISON:  Prior study from 11/21/2022. FINDINGS: Brain: Previously identified right  MCA distribution infarct not well visualized by CT, but appears grossly stable in size and distribution. No other acute large vessel territory infarct. No acute intracranial hemorrhage. No mass lesion or midline shift. No hydrocephalus or extra-axial fluid collection. Underlying chronic microvascular ischemic disease again noted. Vascular: No abnormal hyperdense vessel. Scattered vascular calcifications noted within the carotid siphons. Skull: Scalp soft tissues and calvarium demonstrate no new finding. Sinuses/Orbits: Globes orbital soft tissues within normal limits. Right greater than left maxillary sinus disease noted. No mastoid effusion. Other: None. IMPRESSION: 1. Previously identified right MCA distribution infarct not well visualized by CT, but appears grossly similar in size and distribution. No acute intracranial hemorrhage. 2. No other new acute intracranial abnormality. 3. Underlying chronic microvascular ischemic disease. Electronically Signed   By: Rise Mu M.D.   On: 11/23/2022 00:07   ECHOCARDIOGRAM COMPLETE  Result Date: 11/22/2022    ECHOCARDIOGRAM REPORT   Patient Name:   CRYSTAL [address]RE[address] Date of Exam: 11/22/2022 Medical Rec #:  [id number]      Height:       64.0 in Accession #:    [id number]     Weight:       353.2 lb Date of Birth:  [birth year]      BSA:           2.491 m Patient Age:    72 years       BP:           125/70 mmHg Patient Gender: F              HR:           55 bpm. Exam Location:  [facility] Procedure: 2D Echo and Intracardiac Opacification Agent Indications:     Stroke I63.9  History:         Patient has no prior history of Echocardiogram examinations.  Sonographer:     Overton Mam RDCS, FASE Referring Phys:  [id number] Hubbard Hartshorn OUMA Diagnosing Phys: Chilton Si MD  Sonographer Comments: Technically difficult study due to poor echo windows, patient is obese, suboptimal subcostal window, suboptimal parasternal window and suboptimal apical window. Image acquisition challenging due to patient body habitus. IMPRESSIONS  1. Left ventricular ejection fraction, by estimation, is 70 to 75%. The left ventricle has hyperdynamic function. The left ventricle has no regional wall motion abnormalities. There is mild concentric left ventricular hypertrophy. Left ventricular diastolic parameters are consistent with Grade II diastolic dysfunction (pseudonormalization). Elevated left ventricular end-diastolic pressure.  2. Right ventricular systolic function is normal. The right ventricular size is normal. There is normal pulmonary artery systolic pressure.  3. The mitral valve is normal in structure. Trivial mitral valve regurgitation. No evidence of mitral stenosis.  4. The aortic valve was not well visualized. Aortic valve regurgitation is not visualized. No aortic stenosis is present.  5. The inferior vena cava is dilated in size with >50% respiratory variability, suggesting right atrial pressure of 8 mmHg. FINDINGS  Left Ventricle: Left ventricular ejection fraction, by estimation, is 70 to 75%. The left ventricle has hyperdynamic function. The left ventricle has no regional wall motion abnormalities. Definity contrast agent was given IV to delineate the left ventricular endocardial borders. The left ventricular internal cavity size was normal in size.  There is mild concentric left ventricular hypertrophy. Left ventricular diastolic parameters are consistent with Grade II diastolic dysfunction (pseudonormalization). Elevated left ventricular end-diastolic pressure. Right Ventricle: The right ventricular size is normal. No increase in right ventricular wall thickness. Right ventricular systolic  function is normal. There is normal pulmonary artery systolic pressure. The tricuspid regurgitant velocity is 2.39 m/s, and  with an assumed right atrial pressure of 8 mmHg, the estimated right ventricular systolic pressure is 30.8 mmHg. Left Atrium: Left atrial size was normal in size. Right Atrium: Right atrial size was normal in size. Pericardium: There is no evidence of pericardial effusion. Mitral Valve: The mitral valve is normal in structure. Mild mitral annular calcification. Trivial mitral valve regurgitation. No evidence of mitral valve stenosis. Tricuspid Valve: The tricuspid valve is normal in structure. Tricuspid valve regurgitation is not demonstrated. No evidence of tricuspid stenosis. Aortic Valve: The aortic valve was not well visualized. Aortic valve regurgitation is not visualized. No aortic stenosis is present. Aortic valve peak gradient measures 10.2 mmHg. Pulmonic Valve: The pulmonic valve was normal in structure. Pulmonic valve regurgitation is not visualized. No evidence of pulmonic stenosis. Aorta: The aortic root is normal in size and structure. Venous: The inferior vena cava is dilated in size with greater than 50% respiratory variability, suggesting right atrial pressure of 8 mmHg. IAS/Shunts: No atrial level shunt detected by color flow Doppler.  LEFT VENTRICLE PLAX 2D LVIDd:         4.70 cm      Diastology LVIDs:         3.00 cm      LV e' medial:    4.79 cm/s LV PW:         1.30 cm      LV E/e' medial:  17.2 LV IVS:        1.30 cm      LV e' lateral:   8.16 cm/s LVOT diam:     2.10 cm      LV E/e' lateral: 10.1 LV SV:         98 LV SV Index:    39 LVOT Area:     3.46 cm  LV Volumes (MOD) LV vol d, MOD A2C: 105.0 ml LV vol d, MOD A4C: 103.0 ml LV vol s, MOD A2C: 26.5 ml LV vol s, MOD A4C: 30.2 ml LV SV MOD A2C:     78.5 ml LV SV MOD A4C:     103.0 ml LV SV MOD BP:      77.0 ml RIGHT VENTRICLE RV Basal diam:  3.20 cm RV S prime:     14.50 cm/s TAPSE (M-mode): 2.1 cm LEFT ATRIUM             Index        RIGHT ATRIUM           Index LA diam:        4.60 cm 1.85 cm/m   RA Area:     14.50 cm LA Vol (A2C):   66.4 ml 26.65 ml/m  RA Volume:   38.00 ml  15.25 ml/m LA Vol (A4C):   66.3 ml 26.61 ml/m LA Biplane Vol: 71.4 ml 28.66 ml/m  AORTIC VALVE                 PULMONIC VALVE AV Area (Vmax): 2.27 cm     PV Vmax:        1.14 m/s AV Vmax:        160.00 cm/s  PV Peak grad:   5.2 mmHg AV Peak Grad:   10.2 mmHg    RVOT Peak grad: 3 mmHg LVOT Vmax:      105.00 cm/s LVOT Vmean:     70.600 cm/s LVOT VTI:  0.283 m  AORTA Ao Root diam: 3.30 cm MITRAL VALVE               TRICUSPID VALVE MV Area (PHT): 2.55 cm    TR Peak grad:   22.8 mmHg MV Decel Time: 297 msec    TR Vmax:        239.00 cm/s MV E velocity: 82.30 cm/s MV A velocity: 63.40 cm/s  SHUNTS MV E/A ratio:  1.30        Systemic VTI:  0.28 m                            Systemic Diam: 2.10 cm Chilton Si MD Electronically signed by Chilton Si MD Signature Date/Time: 11/22/2022/1:40:39 PM    Final    MR BRAIN WO CONTRAST  Result Date: 11/22/2022 CLINICAL DATA:  Stroke follow-up EXAM: MRI HEAD WITHOUT CONTRAST TECHNIQUE: Multiplanar, multiecho pulse sequences of the brain and surrounding structures were obtained without intravenous contrast. COMPARISON:  None Available. FINDINGS: Brain: There is an area of mild diffusion abnormality within the right parietal lobe, likely late subacute ischemia. No acute or chronic hemorrhage. There is confluent hyperintense T2-weighted signal within the white matter. Generalized volume loss. The midline structures are normal. Vascular: Major flow voids are  preserved. Skull and upper cervical spine: Normal calvarium and skull base. Visualized upper cervical spine and soft tissues are normal. Sinuses/Orbits:Moderate right maxillary sinus mucosal thickening. Normal orbits. IMPRESSION: Small area of late subacute ischemia within the right parietal lobe. No hemorrhage or mass effect. Electronically Signed   By: Deatra Robinson M.D.   On: 11/22/2022 00:57   CT ANGIO HEAD NECK W WO CM (CODE STROKE)  Result Date: 11/21/2022 CLINICAL DATA:  Acute neurologic deficit EXAM: CT ANGIOGRAPHY HEAD AND NECK WITH AND WITHOUT CONTRAST TECHNIQUE: Multidetector CT imaging of the head and neck was performed using the standard protocol during bolus administration of intravenous contrast. Multiplanar CT image reconstructions and MIPs were obtained to evaluate the vascular anatomy. Carotid stenosis measurements (when applicable) are obtained utilizing NASCET criteria, using the distal internal carotid diameter as the denominator. RADIATION DOSE REDUCTION: This exam was performed according to the departmental dose-optimization program which includes automated exposure control, adjustment of the mA and/or kV according to patient size and/or use of iterative reconstruction technique. CONTRAST:  75mL OMNIPAQUE IOHEXOL 350 MG/ML SOLN COMPARISON:  None Available. FINDINGS: CTA NECK FINDINGS SKELETON: There is no bony spinal canal stenosis. No lytic or blastic lesion. OTHER NECK: Normal pharynx, larynx and major salivary glands. No cervical lymphadenopathy. Unremarkable thyroid gland. UPPER CHEST: No pneumothorax or pleural effusion. No nodules or masses. AORTIC ARCH: There is calcific atherosclerosis of the aortic arch. Conventional 3 vessel aortic branching pattern. RIGHT CAROTID SYSTEM: Normal without aneurysm, dissection or stenosis. LEFT CAROTID SYSTEM: Normal without aneurysm, dissection or stenosis. VERTEBRAL ARTERIES: Left dominant configuration.Both vertebral arteries are normal to the  skull base. CTA HEAD FINDINGS POSTERIOR CIRCULATION: --Vertebral arteries: Poor opacification of the right V4 segment, but PICA is patent and normal. Left V4 segment is normal. --Inferior cerebellar arteries: Normal. --Basilar artery: Normal --Superior cerebellar arteries: Normal. --Posterior cerebral arteries (PCA): Normal. ANTERIOR CIRCULATION: --Intracranial internal carotid arteries: Normal. --Anterior cerebral arteries (ACA): Normal. Both A1 segments are present. Patent anterior communicating artery (a-comm). --Middle cerebral arteries (MCA): Normal. VENOUS SINUSES: As permitted by contrast timing, patent. ANATOMIC VARIANTS: None Review of the MIP images confirms the above findings. IMPRESSION: 1. No emergent large  vessel occlusion or high-grade stenosis of the intracranial arteries. Aortic Atherosclerosis (ICD10-I70.0). Electronically Signed   By: Deatra Robinson M.D.   On: 11/21/2022 23:51   CT HEAD CODE STROKE WO CONTRAST  Result Date: 11/21/2022 CLINICAL DATA:  Code stroke.  Acute neurologic deficit EXAM: CT HEAD WITHOUT CONTRAST TECHNIQUE: Contiguous axial images were obtained from the base of the skull through the vertex without intravenous contrast. RADIATION DOSE REDUCTION: This exam was performed according to the departmental dose-optimization program which includes automated exposure control, adjustment of the mA and/or kV according to patient size and/or use of iterative reconstruction technique. COMPARISON:  None Available. FINDINGS: Brain: There is no mass, hemorrhage or extra-axial collection. The size and configuration of the ventricles and extra-axial CSF spaces are normal. There is hypoattenuation of the periventricular white matter, most commonly indicating chronic ischemic microangiopathy. Vascular: Atherosclerotic calcification of the vertebral and internal carotid arteries at the skull base. No abnormal hyperdensity of the major intracranial arteries or dural venous sinuses. Skull: The  visualized skull base, calvarium and extracranial soft tissues are normal. Sinuses/Orbits: No fluid levels or advanced mucosal thickening of the visualized paranasal sinuses. No mastoid or middle ear effusion. The orbits are normal. ASPECTS [facility] Stroke Program Early CT Score) - Ganglionic level infarction (caudate, lentiform nuclei, internal capsule, insula, M1-M3 cortex): 7 - Supraganglionic infarction (M4-M6 cortex): 3 Total score (0-10 with 10 being normal): 10 IMPRESSION: 1. No acute intracranial abnormality. 2. ASPECTS is 10. Electronically Signed   By: Deatra Robinson M.D.   On: 11/21/2022 22:31               LOS: 2 days   Chavy Avera  [facility]   Pager on [email or web address]. If 7PM-7AM, please contact night-coverage at [email or web address]     11/23/2022, 3:45 PM

## 2022-11-23 NOTE — Evaluation (Signed)
Occupational Therapy Evaluation Patient Details Name: Christina Rubio MRN: [id number] DOB: 19-Jul-1950 Today's Date: 11/23/2022   History of Present Illness Pt admitted to [facility] on 11/21/22 for c/o stroke like symptoms including: LUE numbness and tingling. Code stroke initiated and TNK administered. Imaging significant for: small area of late subacute ischemia within the R parietal lobe. Significant PMH includes: anxiety, hypertension, hyperlipidemia, carpal tunnel, and spinal stenosis.   Clinical Impression   Patient received for OT evaluation. See flowsheet below for details of function. Generally, patient MOD (I) with rails and extra time for bed mobility, intially MAX A with bariatric RW for sit to stand, then supervision/CGA later in session, CGA with RW for functional sidestepping (did not walk away from bed/chair today), and overall MIN A for ADLs. Patient will benefit from continued OT while in acute care.        If plan is discharge home, recommend the following: A little help with walking and/or transfers;A lot of help with bathing/dressing/bathroom;Assistance with cooking/housework;Assist for transportation;Help with stairs or ramp for entrance    Functional Status Assessment  Patient has had a recent decline in their functional status and demonstrates the ability to make significant improvements in function in a reasonable and predictable amount of time.  Equipment Recommendations  Tub/shower bench    Recommendations for Other Services       Precautions / Restrictions Precautions Precautions: Fall Restrictions Weight Bearing Restrictions: No Other Position/Activity Restrictions: SpO2 >/= 92%      Mobility Bed Mobility Overal bed mobility: Modified Independent             General bed mobility comments: extra time and hefty use of rails.    Transfers Overall transfer level: Needs assistance Equipment used: Rolling walker (2 wheels) Transfers: Sit to/from  Stand Sit to Stand: Max assist, Supervision           General transfer comment: initially max assist for power to stand from EOB with bariatric RW, with increased reliance on momentum to facilitate transfer. Later able to perform x2 STS from EOB with RW with supervision; continues to require increased time/effort and reliance on momentum. Demonstrates "Fair" eccentric control with stand>sit transfers, with cues for safety, sequencing, and hand placement.      Balance Overall balance assessment: Needs assistance Sitting-balance support: Feet supported, Single extremity supported, Bilateral upper extremity supported Sitting balance-Leahy Scale: Good     Standing balance support: Reliant on assistive device for balance, During functional activity, Bilateral upper extremity supported Standing balance-Leahy Scale: Fair Standing balance comment: standing balance in RW; increased UE WB/support on RW                           ADL either performed or assessed with clinical judgement   ADL Overall ADL's : Needs assistance/impaired Eating/Feeding: Independent   Grooming: Set up;Sitting Grooming Details (indicate cue type and reason): not tested, but anticipate pt would not be able to stand for grooming. At baseline she usually sits for grooming at a table on her bedside. States she is unable to stand long enough for the water to get warm.   Upper Body Bathing Details (indicate cue type and reason): anticipate set up from seated   Lower Body Bathing Details (indicate cue type and reason): anticipate pt to need long-handled sponge   Upper Body Dressing Details (indicate cue type and reason): anticipate set up from seated   Lower Body Dressing Details (indicate cue type and reason): anticipate  pt to need AE and possible MIN A; would be unable to reach feet today   Toilet Transfer Details (indicate cue type and reason): pt would require CGA and RW for use of bariatric BSC only;  unable to walk away from bed/chair today safely.   Toileting - Clothing Manipulation Details (indicate cue type and reason): anticipate MIN A   Tub/Shower Transfer Details (indicate cue type and reason): anticipate CGA to shower chair pivot only Functional mobility during ADLs: Rolling walker (2 wheels) (MAX A to CGA today- first sit to stand was MAX A to bariatric RW; second and third sit to stands were CGA of RW only with pt exerting extra effort and attempts.) General ADL Comments: Seated or briefly standing only at this point; pt leaning forward with forearms at RW due to back pain. Pt states she only trusts her rollator; feels it is more steady than RW, but willing to use bariatric RW today since it is offered.     Vision Patient Visual Report: No change from baseline Vision Assessment?: Yes Additional Comments: tested peripheral vision; WNL     Perception         Praxis         Pertinent Vitals/Pain Pain Assessment Pain Assessment: Faces Faces Pain Scale: Hurts a little bit Pain Location: back (chronic) Pain Descriptors / Indicators: Aching Pain Intervention(s): Limited activity within patient's tolerance, Monitored during session     Extremity/Trunk Assessment Upper Extremity Assessment Upper Extremity Assessment: LUE deficits/detail LUE Deficits / Details: 4/5 strength (compared to 5/5 in RUE) at shoulder and forearm; decreased sensation (but was able to feel OT light touch upon testing), intact proprioception. LUE Sensation: decreased light touch LUE Coordination: WNL   Lower Extremity Assessment Lower Extremity Assessment: Defer to PT evaluation   Cervical / Trunk Assessment Cervical / Trunk Assessment: Normal (pt leans forward on RW 2/2 back pain)   Communication Communication Communication: No apparent difficulties Cueing Techniques: Verbal cues;Tactile cues   Cognition Arousal: Alert Behavior During Therapy: WFL for tasks assessed/performed Overall  Cognitive Status: Within Functional Limits for tasks assessed                                 General Comments: Oriented.     General Comments  Bruising noted on skin in multiple areas. Education provided on role of rehab and pt's decreased safety if she chooses to d/c home vs further rehab.    Exercises     Shoulder Instructions      Home Living Family/patient expects to be discharged to:: Private residence Living Arrangements: Alone (son-in-law and 68 y/o grandson live nearby. Pt's daughter died in 07-26-22.) Available Help at Discharge: Family;Friend(s);Available PRN/intermittently (Pt states she also has friends/neighbors that can help intermittently) Type of Home: House Home Access: Stairs to enter [employer] of Steps: 1 Entrance Stairs-Rails:  (grab bar next to step) Home Layout: One level     Bathroom Shower/Tub: Tub only;Walk-in shower (Pt states she uses walk-in shower, but does not feel totally safe; fearful of falling due to shower set up. Has shower seat in tub, sometimes uses that instead.)   Bathroom Toilet: Standard (with bidet)     Home Equipment: Rollator (4 wheels);Shower seat;Grab bars - toilet;Other (comment) (adjustable bed, but pt states she doesn't like it)   Additional Comments: pt mentions wanting a bed rail for home      Prior Functioning/Environment Prior Level of Function :  Independent/Modified Independent             Mobility Comments: Mod I for ambulation with rollator. Does not drive long distances right now due to visual impairment (cataract, which she states she will get removed soon). Pt mentions 5 falls in the past 3 months. Mentions issues with her L knee. Her last fall was approx 1 month ago where she slid off the edge of the bed trying to get up. ADLs Comments: Pt reports being mod I for ADL's and light IADL's, receiving assistance from family and friends. Pt states she orders groceries. Pt has two dogs (one large  and one small); has their food/water bowls on elevated stool; she often doesn't reach forward to get to them, just pours food/water into them and s-i-l or grandson help to clean if needed. Grandson helps manage her birdfeeders. 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        OT Problem List: Decreased strength;Decreased activity tolerance      OT Treatment/Interventions: Self-care/ADL training;Therapeutic exercise;Therapeutic activities;Patient/family education;DME and/or AE instruction    OT Goals(Current goals can be found in the care plan section) Acute Rehab OT Goals Patient Stated Goal: Go home OT Goal Formulation: With patient Time For Goal Achievement: 12/07/22 Potential to Achieve Goals: Fair ADL Goals Pt Will Perform Grooming: with modified independence;standing Pt Will Perform Lower Body Bathing: with modified independence;sitting/lateral leans Pt Will Perform Lower Body Dressing: with modified independence;sit to/from stand;with adaptive equipment Pt Will Transfer to Toilet: with modified independence;bedside commode;ambulating Pt Will Perform Toileting - Clothing Manipulation and hygiene: with modified independence;sit to/from stand  OT Frequency: Min 1X/week    Co-evaluation PT/OT/SLP Co-Evaluation/Treatment: Yes Reason for Co-Treatment: For patient/therapist safety PT goals addressed during session: Mobility/safety with mobility;Balance OT goals addressed during session: ADL's and self-care;Proper use of Adaptive equipment and DME      AM-PAC OT "6 Clicks" Daily Activity     Outcome Measure Help from another person eating meals?: None Help from another person taking care of personal grooming?: A Little Help from another person toileting, which includes using toliet, bedpan, or urinal?: A Little Help from another person bathing (including washing, rinsing, drying)?: A Little Help from another person to put on and taking off regular upper body clothing?:  None Help from another person to put on and taking off regular lower body clothing?: A Lot 6 Click Score: 19   End of Session Equipment Utilized During Treatment: Rolling walker (2 wheels);Gait belt Nurse Communication: Mobility status  Activity Tolerance: Patient tolerated treatment well;Patient limited by fatigue Patient left: in chair;with call bell/phone within reach;with chair alarm set (LE elevated)  OT Visit Diagnosis: Unsteadiness on feet (R26.81);Repeated falls (R29.6);Muscle weakness (generalized) (M62.81)                Time: 1102-1150 OT Time Calculation (min): 48 min Charges:  OT General Charges $OT Visit: 1 Visit OT Evaluation $OT Eval Moderate Complexity: 1 Mod OT Treatments $Therapeutic Activity: 8-22 mins  Linward Foster, MS, OTR/L   Alvester Morin 11/23/2022, 1:58 PM

## 2022-11-23 NOTE — TOC Initial Note (Signed)
Transition of Care [facility]) - Initial/Assessment Note    Patient Details  Name: Christina Rubio MRN: [id number] Date of Birth: 1950-12-10  Transition of [facility]) CM/SW Contact:    Kemper Durie, RN Phone Number: 11/23/2022, 4:40 PM  Clinical Narrative:                  Patient aware of recommendations for SNF, however she now is raising 71 year old grandson after her daughter passed away earlier this year.  She want to go home with [facility], but is looking to find additional support that can come into the home to help.  State she is in the process of calling agencies and friends to set this up, would like for TOC to follow up for final decision prior to SNF work up.   Expected Discharge Plan: Home w Home Health Services Barriers to Discharge: Continued Medical Work up   Patient Goals and CMS Choice            Expected Discharge Plan and Services     Post Acute Care Choice: Home Health Living arrangements for the past 2 months: Single Family Home                                      Prior Living Arrangements/Services Living arrangements for the past 2 months: Single Family Home Lives with:: Minor Children                   Activities of Daily Living Home Assistive Devices/Equipment: Environmental consultant (specify type) ADL Screening (condition at time of admission) Patient's cognitive ability adequate to safely complete daily activities?: Yes Is the patient deaf or have difficulty hearing?: No Does the patient have difficulty seeing, even when wearing glasses/contacts?: No Does the patient have difficulty concentrating, remembering, or making decisions?: No Patient able to express need for assistance with ADLs?: Yes Does the patient have difficulty dressing or bathing?: No Independently performs ADLs?: Yes (appropriate for developmental age) Does the patient have difficulty walking or climbing stairs?: No Weakness of Legs: None Weakness of Arms/Hands: None  Permission  Sought/Granted                  Emotional Assessment              Admission diagnosis:  Cerebrovascular accident (CVA), unspecified mechanism (HCC) [I63.9] Acute stroke due to ischemia [facility]) [I63.9] Patient Active Problem List   Diagnosis Date Noted   Paroxysmal atrial fibrillation (HCC) 11/23/2022   CVA (cerebral vascular accident) (HCC) 11/21/2022   Non-healing wound of lower extremity 08/08/2021   Poor dentition 12/04/2020   BMI 60.0-69.9, adult (HCC) 12/04/2020   Lymphedema 05/23/2020   Bilateral lower extremity edema 01/16/2020   Esophageal dysphagia 01/13/2019   Toenail deformity 01/13/2019   Chest pain 09/22/2018   GAD (generalized anxiety disorder) 05/24/2018   T2DM (type 2 diabetes mellitus) (HCC) 05/24/2018   Venous stasis 07/28/2017   Spinal stenosis 05/28/2017   DJD (degenerative joint disease) 05/28/2017   Hypertension associated with diabetes (HCC) 02/10/2016   Hyperlipidemia associated with type 2 diabetes mellitus (HCC) 02/10/2016   MDD (major depressive disorder) 02/10/2016   Hypothyroidism due to acquired atrophy of thyroid 02/10/2016   Morbid obesity (HCC) 01/09/2016   PCP:  Erasmo Downer, MD Pharmacy:   [facility] - [place], [place] - [address] [place] [address] Phone:  [phone] Fax: [phone]  [id number] - [place], [place] - [address] AT [facility] OF [address] [place] [address] Phone: [phone] Fax: [phone]  [facility] [address] [place], [place] - [address] AT [facility] OF [address] [place] [address] Phone: [phone] Fax: [phone]     Social Determinants of Health (SDOH) Social History: SDOH Screenings   Food Insecurity: No Food Insecurity (11/22/2022)  Housing: Low Risk  (11/22/2022)  Transportation Needs: No Transportation Needs (11/22/2022)   Utilities: Not At Risk (11/22/2022)  Alcohol Screen: Low Risk  (03/11/2022)  Depression (PHQ2-9): Medium Risk (11/11/2022)  Financial Resource Strain: Low Risk  (03/11/2022)  Physical Activity: Inactive (03/11/2022)  Social Connections: Socially Isolated (03/11/2022)  Stress: No Stress Concern Present (03/11/2022)  Tobacco Use: Low Risk  (11/21/2022)   SDOH Interventions:     Readmission Risk Interventions     No data to display

## 2022-11-23 NOTE — Progress Notes (Signed)
Neurology progress note  S: Patient was found to be in a fib on tele today, normal ventricular rate, asymptomatic. Numbness has nearly resolved and neglect has resolved since yesterday.  O:  Vitals:   11/23/22 1153 11/23/22 1610  BP: (!) 141/90 (!) 143/90  Pulse: 76 74  Resp: 18 18  Temp: 98 F (36.7 C) 98 F (36.7 C)  SpO2: 100% 100%   Gen: patient lying in bed, NAD CV: extremities appear well-perfused Resp: normal WOB   Neurologic exam MS: alert, oriented x4, follows commands Speech: no dysarthria, no aphasia CN: PERRL, VFF, EOMI, sensation intact, face symmetric, hearing intact to voice Motor: 5/5 strength throughout Sensory: minimal sensory deficit LUE, no extinction to DSS Reflexes: 2+ symm with toes down bilat Coordination: FNF intact bilat Gait: deferred   NIHSS = 1  Data  MRI brain shows acute R parietal infarct, personal review.   CTA H&N unremarkable  24 hr head CT post TNK showed no hemorrhagic conversion  TTE no intracardiac clot  Stroke Labs     Component Value Date/Time   CHOL 125 11/22/2022 0544   CHOL 168 11/11/2022 1124   TRIG 84 11/22/2022 0544   HDL 39 (L) 11/22/2022 0544   HDL 46 11/11/2022 1124   CHOLHDL 3.2 11/22/2022 0544   VLDL 17 11/22/2022 0544   LDLCALC 69 11/22/2022 0544   LDLCALC 90 11/11/2022 1124   LABVLDL 32 11/11/2022 1124    Lab Results  Component Value Date/Time   HGBA1C 5.9 (H) 11/11/2022 11:24 AM      A/P: 72 yo woman with hx DM2, HTN, HL p/w L sided sensory deficit and hemineglect s/p TNK. Sx have nearly resolved. MRI brain shows R parietal infarct. While it appears subacute by imaging by sx stroke occurred clearly at time of presentation overnight 8/30. Etiology cardioembolic in the setting of new dx a fib. 24 hr head CT post TNK showed no hemorrhagic conversion. CHADS-VASc is 6 warranting anticoagulation. Will defer until day 5 from sx onset to reduce risk of hemorrhagic conversion.    - OK to start DVT  prophylaxis - Start eliquis day 5 from admission. Patient does not need to stay admitted until then. She was cautioned that if she has new or worsening sx she should seek medical care immediately in ED and not start eliquis - Start ASA 81mg  daily today. Stop aspirin when eliquis is started - Atorvastatin 80mg  daily - SBP <180/105 for today - Beginning tomorrow gently move towards goal of normotension, avoid hypotension - STAT head CT for any change in neurologic exam - PT/OT and speech therapy - stroke education - outpatient f/u with neurology after discharge, I will arrange  Neurology to be available prn for questions going forward  Bing Neighbors, MD [facility] [phone]  If 7pm- 7am, please page neurology on call as listed in AMION.

## 2022-11-23 NOTE — Evaluation (Addendum)
Physical Therapy Evaluation Patient Details Name: Christina Rubio MRN: [id number] DOB: 02-07-1951 Today's Date: 11/23/2022  History of Present Illness  Pt admitted to [facility] on 11/21/22 for c/o stroke like symptoms including: LUE numbness and tingling. Code stroke initiated and TNK administered. Imaging significant for: small area of late subacute ischemia within the R parietal lobe. Significant PMH includes: anxiety, hypertension, hyperlipidemia, carpal tunnel, and spinal stenosis.   Clinical Impression  Pt is a 72 year old F admitted to hospital on 11/21/22 for acute stroke due to ischemia. At baseline, pt is mod I for ambulation in the home with rollator, ADL's, and light IADL's. Pt states she has friends and family who help with IADL's as needed.   Pt presents with generalized weakness, decreased activity tolerance, obesity, impaired skin integrity, limited hip AROM secondary to habitus, decreased safety awareness, decreased standing balance, and increased pain levels (Chronic), resulting in impaired functional mobility from baseline. Due to deficits, pt required mod I for bed mobility, supervision-max assist for transfers with bariatric RW, and CGA to ambulate short distance with bariatric RW. Pt able to improve STS transfers within session, but continues to require multiple attempts, momentum, and significant forward trunk flexion increasing risk of falling forward during transfers. Pt endorses hx of L knee buckling with functional mobility which has contributed to many of her falls; no active knee buckling noted during functional mobility today.  Deficits limit the pt's ability to safely and independently perform ADL's, transfer, and ambulate. Pt will benefit from acute skilled PT services to address deficits for return to baseline function. Pt will benefit from post acute therapy services to address deficits for return to baseline function.         If plan is discharge home, recommend the  following: A little help with walking and/or transfers;A little help with bathing/dressing/bathroom;Assistance with cooking/housework;Assist for transportation;Help with stairs or ramp for entrance   Can travel by private vehicle   No    Equipment Recommendations None recommended by PT     Functional Status Assessment Patient has had a recent decline in their functional status and demonstrates the ability to make significant improvements in function in a reasonable and predictable amount of time.     Precautions / Restrictions Precautions Precautions: Fall Restrictions Weight Bearing Restrictions: No Other Position/Activity Restrictions: SpO2 >/= 92%      Mobility  Bed Mobility Overal bed mobility: Modified Independent             General bed mobility comments: mod I for supine>sit with HOB elevated and use of BUE for support. Increased time/effort for facilitation.    Transfers Overall transfer level: Needs assistance Equipment used: Rolling walker (2 wheels) Transfers: Sit to/from Stand Sit to Stand: Max assist, Supervision           General transfer comment: initially max assist for power to stand from EOB with bariatric RW, with increased reliance on momentum to facilitate transfer. Later able to perform x2 STS from EOB with RW with supervision; continues to require increased time/effort and reliance on momentum. Demonstrates "Fair" eccentric control with stand>sit transfers, with cues for safety, sequencing, and hand placement.    Ambulation/Gait   Gait Distance (Feet): 3 Feet (4-6 lateral steps at bedside; 2-3 steps from EOB>recliner) Assistive device: Rolling walker (2 wheels)         General Gait Details: Initially min assist for RW management to take multiple small side steps towards HOB. Later able to progress to supervision for safety to  take a few steps from EOB>recliner with RW. Increased time/effort with truncal flexion and forearm resting on RW due  to c/o chronic back pain.      Balance Overall balance assessment: Needs assistance Sitting-balance support: Feet supported, Single extremity supported, Bilateral upper extremity supported Sitting balance-Leahy Scale: Good     Standing balance support: Reliant on assistive device for balance, During functional activity, Bilateral upper extremity supported Standing balance-Leahy Scale: Fair Standing balance comment: standing balance in RW; increased UE WB/support on RW                             Pertinent Vitals/Pain Pain Assessment Pain Assessment: Faces Faces Pain Scale: Hurts a little bit Pain Location: back (chronic)    Home Living Family/patient expects to be discharged to:: Private residence Living Arrangements: Alone (SIL and grandson live a few miles away) Available Help at Discharge: Family;Friend(s);Available PRN/intermittently Type of Home: House Home Access: Stairs to enter Entrance Stairs-Rails:  (grab bar next to step) Entrance Stairs-Number of Steps: 1   Home Layout: One level Home Equipment: Rollator (4 wheels);Shower seat Additional Comments: getting adjustable bed fixed; talked about getting railing for bed as well    Prior Function               Mobility Comments: Mod I for ambulation with rollator. Does not drive due to visual impairment (cataract) ADLs Comments: Pt reports being mod I for ADL's and light IADL's, receiving assistance from family and friends. Pt states she orders groceries.     Extremity/Trunk Assessment   Upper Extremity Assessment Upper Extremity Assessment: Defer to OT evaluation    Lower Extremity Assessment Lower Extremity Assessment: Generalized weakness (sensation grossly intact; limited hip AROM secondary to pt habitus. Intact coordination. Unable to assess proprioception due to difficulty following commands for sequencing of task)    Cervical / Trunk Assessment Cervical / Trunk Assessment: Normal   Communication   Communication Communication: No apparent difficulties Cueing Techniques: Verbal cues;Tactile cues  Cognition Arousal: Alert Behavior During Therapy: WFL for tasks assessed/performed Overall Cognitive Status: Within Functional Limits for tasks assessed                                          General Comments General comments (skin integrity, edema, etc.): multiple areas of bruising/healing scabs to LE/UE from previous falls.    Exercises Other Exercises Other Exercises: Participates in bed mobility, multiple STS transfers, and minimal gait with RW. Increased time/effort due to generalized deconditioning and weakness. Other Exercises: Pt educated re: PT role/POC, DC recommendations, safety with functional mobility, realistic assist needed at DC, energy conservation/pacing. She verbalized understanding.   Assessment/Plan    PT Assessment Patient needs continued PT services  PT Problem List Decreased strength;Decreased range of motion;Decreased activity tolerance;Decreased balance;Decreased mobility;Decreased safety awareness;Obesity;Decreased skin integrity;Pain       PT Treatment Interventions DME instruction;Gait training;Stair training;Functional mobility training;Therapeutic activities;Therapeutic exercise;Balance training;Neuromuscular re-education;Patient/family education    PT Goals (Current goals can be found in the Care Plan section)  Acute Rehab PT Goals Patient Stated Goal: "go home" PT Goal Formulation: With patient Time For Goal Achievement: 12/07/22 Potential to Achieve Goals: Fair    Frequency Min 1X/week     Co-evaluation PT/OT/SLP Co-Evaluation/Treatment: Yes Reason for Co-Treatment: For patient/therapist safety PT goals addressed during session: Mobility/safety with mobility;Balance OT goals  addressed during session: ADL's and self-care;Proper use of Adaptive equipment and DME       AM-PAC PT "6 Clicks" Mobility   Outcome Measure Help needed turning from your back to your side while in a flat bed without using bedrails?: None Help needed moving from lying on your back to sitting on the side of a flat bed without using bedrails?: None Help needed moving to and from a bed to a chair (including a wheelchair)?: A Little Help needed standing up from a chair using your arms (e.g., wheelchair or bedside chair)?: A Little Help needed to walk in hospital room?: A Little Help needed climbing 3-5 steps with a railing? : A Lot 6 Click Score: 19    End of Session Equipment Utilized During Treatment: Gait belt Activity Tolerance: Patient tolerated treatment well Patient left: in chair;with call bell/phone within reach;with chair alarm set Nurse Communication: Mobility status PT Visit Diagnosis: Unsteadiness on feet (R26.81);Repeated falls (R29.6);Muscle weakness (generalized) (M62.81);History of falling (Z91.81);Difficulty in walking, not elsewhere classified (R26.2);Pain Pain - part of body:  (chronic back pain)    Time: 2130-8657 PT Time Calculation (min) (ACUTE ONLY): 48 min   Charges:   PT Evaluation $PT Eval Low Complexity: 1 Low PT Treatments $Therapeutic Activity: 8-22 mins PT General Charges $$ ACUTE PT VISIT: 1 Visit        Vira Blanco, PT, DPT 1:21 PM,11/23/22 Physical Therapist - [facility]

## 2022-11-23 NOTE — Progress Notes (Signed)
PHARMACIST - PHYSICIAN COMMUNICATION  CONCERNING:  Enoxaparin (Lovenox) for DVT Prophylaxis    RECOMMENDATION: Patient was prescribed enoxaprin 40mg  q24 hours for VTE prophylaxis.   Filed Weights   11/21/22 2203  Weight: (!) 160.2 kg (353 lb 2.8 oz)    Body mass index is 60.62 kg/m.  Estimated Creatinine Clearance: 84.9 mL/min (by C-G formula based on SCr of 0.93 mg/dL).   Based on [facility] policy patient is candidate for enoxaparin 0.5mg /kg TBW SQ every 24 hours based on BMI being >30.   DESCRIPTION: Pharmacy has adjusted enoxaparin dose per [facility] policy.  Patient is now receiving enoxaparin 80 mg every 24 hours    Barrie Folk, PharmD Clinical Pharmacist  11/23/2022 2:55 PM

## 2022-11-24 DIAGNOSIS — I639 Cerebral infarction, unspecified: Secondary | ICD-10-CM | POA: Diagnosis not present

## 2022-11-24 MED ORDER — APIXABAN 5 MG PO TABS: 5.0000 mg | ORAL_TABLET | Freq: Two times a day (BID) | ORAL | 0 refills | Status: DC

## 2022-11-24 MED ORDER — ASPIRIN 81 MG PO TBEC: 81.0000 mg | DELAYED_RELEASE_TABLET | Freq: Every day | ORAL | Status: AC

## 2022-11-24 NOTE — Discharge Summary (Signed)
Physician Discharge Summary   Patient: Christina Rubio MRN: [id number] DOB: 1951/03/08  Admit date:     11/21/2022  Discharge date: 11/24/22  Discharge Physician: Lurene Shadow   PCP: Erasmo Downer, MD   Recommendations at discharge:   Follow-up with PCP in 1 week Outpatient follow-up with neurologist (office will call to schedule appointment)  Discharge Diagnoses: Principal Problem:   CVA (cerebral vascular accident) [facility]) Active Problems:   Hypertension associated with diabetes (HCC)   Paroxysmal atrial fibrillation (HCC)  Resolved Problems:   * No resolved hospital problems. [facility] Course:  Christina Rubio is a 72 y.o. female with medical history significant for anxiety, depression, type 2 diabetes mellitus, morbid obesity, thyroid disease s/p partial thyroidectomy, hypertension, hyperlipidemia, bilateral lymphedema, who presented to the hospital because of sudden onset of left arm weakness and numbness.  Symptoms started around 7:30 PM.  Last known normal was on 11/21/2022 at around 6:30 PM.   She was evaluated by the tele neurologist and she was deemed to be a candidate for tPA for acute stroke.  She was given tPA and was admitted to the ICU for further management.  Cardiac monitoring revealed atrial fibrillation which was confirmed with twelve-lead EKG.      Assessment and Plan:  Acute stroke: S/p tPA (tenecteplase) on 11/21/2022 at 10:39 PM. Left upper extremity numbness and weakness have proved. Continue aspirin at discharge but discontinue aspirin and start Eliquis on 11/27/2022. PT and OT recommended discharge to SNF.  However, patient prefers to go home with home health therapy.  She understands she is at increased risk for falls.  She said she will be hiring somebody for additional support in addition to home health therapy.     Paroxysmal atrial fibrillation: Rate controlled.  She will start Eliquis on 11/27/2022.  CHA2DS2-VASc score is 6.     Hypokalemia:  Improved.       Other comorbidities include hypertension, hyperlipidemia, type II DM (last hemoglobin A1c was 5.9), anxiety, depression, hypothyroidism, CKD stage IIIa   Her condition has improved and she is deemed stable for discharge to home today.       Consultants: Neurologist Procedures performed: None Disposition: Home health Diet recommendation:  Discharge Diet Orders (From admission, onward)     Start     Ordered   11/24/22 0000  Diet - low sodium heart healthy        11/24/22 1106           Cardiac and Carb modified diet DISCHARGE MEDICATION: Allergies as of 11/24/2022       Reactions   Shellfish Allergy    Patient reported.   Strawberry Extract Rash   Penicillins Rash   Has patient had a PCN reaction causing immediate rash, facial/tongue/throat swelling, SOB or lightheadedness with hypotension:  Yes Has patient had a PCN reaction causing severe rash involving mucus membranes or skin necrosis: No Has patient had a PCN reaction that required hospitalization: No Has patient had a PCN reaction occurring within the last 10 years: No If all of the above answers are "NO", then may proceed with Cephalosporin use.;        Medication List     STOP taking these medications    metroNIDAZOLE 500 MG tablet Commonly known as: FLAGYL       TAKE these medications    ALPRAZolam 0.5 MG tablet Commonly known as: XANAX TAKE 1 TABLET BY MOUTH EVERY DAY AS NEEDED FOR ANXIETY.   apixaban 5 MG Tabs  tablet Commonly known as: ELIQUIS Take 1 tablet (5 mg total) by mouth 2 (two) times daily. Start taking on: November 27, 2022   aspirin EC 81 MG tablet Take 1 tablet (81 mg total) by mouth daily for 2 days. Swallow whole. Start taking on: November 25, 2022   atorvastatin 20 MG tablet Commonly known as: LIPITOR TAKE 1 TABLET BY MOUTH DAILY   azelastine 0.1 % nasal spray Commonly known as: ASTELIN Place 1 spray into both nostrils 2 (two) times daily. Use in each  nostril as directed   levothyroxine 112 MCG tablet Commonly known as: SYNTHROID TAKE 1 TABLET EVERY DAY ON EMPTY STOMACHWITH A GLASS OF WATER AT LEAST 30-60 MINBEFORE BREAKFAST   loperamide 2 MG tablet Commonly known as: IMODIUM A-D Take 1 tablet (2 mg total) by mouth 4 (four) times daily as needed for diarrhea or loose stools.   metoprolol succinate 50 MG 24 hr tablet Commonly known as: TOPROL-XL TAKE 1 TABLET BY MOUTH DAILY WITH OR IMMEDIATELY FOLLOWING A MEAL   PreserVision AREDS 2 Caps Take 1 tablet by mouth 2 (two) times daily.   PROBIOTIC PO Take by mouth daily.   sertraline 100 MG tablet Commonly known as: ZOLOFT TAKE 2 TABLETS BY MOUTH DAILY WITH BREAKFAST   valsartan-hydrochlorothiazide 160-25 MG tablet Commonly known as: DIOVAN-HCT TAKE 1 TABLET BY MOUTH NIGHTLY        Discharge Exam: Filed Weights   11/21/22 2203  Weight: (!) 160.2 kg   GEN: NAD SKIN: Warm and dry EYES: No pallor or icterus ENT: MMM CV: RRR PULM: CTA B ABD: soft, obese, NT, +BS CNS: AAO x 3, non focal EXT: Bilateral leg lymphedema   Condition at discharge: good  The results of significant diagnostics from this hospitalization (including imaging, microbiology, ancillary and laboratory) are listed below for reference.   Imaging Studies: CT HEAD WO CONTRAST ( )  Result Date: 11/23/2022 CLINICAL DATA:  Follow-up examination for stroke, status post T NK. EXAM: CT HEAD WITHOUT CONTRAST TECHNIQUE: Contiguous axial images were obtained from the base of the skull through the vertex without intravenous contrast. RADIATION DOSE REDUCTION: This exam was performed according to the departmental dose-optimization program which includes automated exposure control, adjustment of the mA and/or kV according to patient size and/or use of iterative reconstruction technique. COMPARISON:  Prior study from 11/21/2022. FINDINGS: Brain: Previously identified right MCA distribution infarct not well visualized  by CT, but appears grossly stable in size and distribution. No other acute large vessel territory infarct. No acute intracranial hemorrhage. No mass lesion or midline shift. No hydrocephalus or extra-axial fluid collection. Underlying chronic microvascular ischemic disease again noted. Vascular: No abnormal hyperdense vessel. Scattered vascular calcifications noted within the carotid siphons. Skull: Scalp soft tissues and calvarium demonstrate no new finding. Sinuses/Orbits: Globes orbital soft tissues within normal limits. Right greater than left maxillary sinus disease noted. No mastoid effusion. Other: None. IMPRESSION: 1. Previously identified right MCA distribution infarct not well visualized by CT, but appears grossly similar in size and distribution. No acute intracranial hemorrhage. 2. No other new acute intracranial abnormality. 3. Underlying chronic microvascular ischemic disease. Electronically Signed   By: Rise Mu M.D.   On: 11/23/2022 00:07   ECHOCARDIOGRAM COMPLETE  Result Date: 11/22/2022    ECHOCARDIOGRAM REPORT   Patient Name:   Christina Rubio Date of Exam: 11/22/2022 Medical Rec #:  [id number]      Height:       64.0 in Accession #:    [id number]  Weight:       353.2 lb Date of Birth:  1951/01/31      BSA:          2.491 m Patient Age:    72 years       BP:           125/70 mmHg Patient Gender: F              HR:           55 bpm. Exam Location:  [facility] Procedure: 2D Echo and Intracardiac Opacification Agent Indications:     Stroke I63.9  History:         Patient has no prior history of Echocardiogram examinations.  Sonographer:     Overton Mam RDCS, FASE Referring Phys:  [id number] Hubbard Hartshorn OUMA Diagnosing Phys: Chilton Si MD  Sonographer Comments: Technically difficult study due to poor echo windows, patient is obese, suboptimal subcostal window, suboptimal parasternal window and suboptimal apical window. Image acquisition challenging due to patient body habitus.  IMPRESSIONS  1. Left ventricular ejection fraction, by estimation, is 70 to 75%. The left ventricle has hyperdynamic function. The left ventricle has no regional wall motion abnormalities. There is mild concentric left ventricular hypertrophy. Left ventricular diastolic parameters are consistent with Grade II diastolic dysfunction (pseudonormalization). Elevated left ventricular end-diastolic pressure.  2. Right ventricular systolic function is normal. The right ventricular size is normal. There is normal pulmonary artery systolic pressure.  3. The mitral valve is normal in structure. Trivial mitral valve regurgitation. No evidence of mitral stenosis.  4. The aortic valve was not well visualized. Aortic valve regurgitation is not visualized. No aortic stenosis is present.  5. The inferior vena cava is dilated in size with >50% respiratory variability, suggesting right atrial pressure of 8 mmHg. FINDINGS  Left Ventricle: Left ventricular ejection fraction, by estimation, is 70 to 75%. The left ventricle has hyperdynamic function. The left ventricle has no regional wall motion abnormalities. Definity contrast agent was given IV to delineate the left ventricular endocardial borders. The left ventricular internal cavity size was normal in size. There is mild concentric left ventricular hypertrophy. Left ventricular diastolic parameters are consistent with Grade II diastolic dysfunction (pseudonormalization). Elevated left ventricular end-diastolic pressure. Right Ventricle: The right ventricular size is normal. No increase in right ventricular wall thickness. Right ventricular systolic function is normal. There is normal pulmonary artery systolic pressure. The tricuspid regurgitant velocity is 2.39 m/s, and  with an assumed right atrial pressure of 8 mmHg, the estimated right ventricular systolic pressure is 30.8 mmHg. Left Atrium: Left atrial size was normal in size. Right Atrium: Right atrial size was normal in size.  Pericardium: There is no evidence of pericardial effusion. Mitral Valve: The mitral valve is normal in structure. Mild mitral annular calcification. Trivial mitral valve regurgitation. No evidence of mitral valve stenosis. Tricuspid Valve: The tricuspid valve is normal in structure. Tricuspid valve regurgitation is not demonstrated. No evidence of tricuspid stenosis. Aortic Valve: The aortic valve was not well visualized. Aortic valve regurgitation is not visualized. No aortic stenosis is present. Aortic valve peak gradient measures 10.2 mmHg. Pulmonic Valve: The pulmonic valve was normal in structure. Pulmonic valve regurgitation is not visualized. No evidence of pulmonic stenosis. Aorta: The aortic root is normal in size and structure. Venous: The inferior vena cava is dilated in size with greater than 50% respiratory variability, suggesting right atrial pressure of 8 mmHg. IAS/Shunts: No atrial level shunt detected by color flow Doppler.  LEFT VENTRICLE PLAX 2D LVIDd:         4.70 cm      Diastology LVIDs:         3.00 cm      LV e' medial:    4.79 cm/s LV PW:         1.30 cm      LV E/e' medial:  17.2 LV IVS:        1.30 cm      LV e' lateral:   8.16 cm/s LVOT diam:     2.10 cm      LV E/e' lateral: 10.1 LV SV:         98 LV SV Index:   39 LVOT Area:     3.46 cm  LV Volumes (MOD) LV vol d, MOD A2C: 105.0 ml LV vol d, MOD A4C: 103.0 ml LV vol s, MOD A2C: 26.5 ml LV vol s, MOD A4C: 30.2 ml LV SV MOD A2C:     78.5 ml LV SV MOD A4C:     103.0 ml LV SV MOD BP:      77.0 ml RIGHT VENTRICLE RV Basal diam:  3.20 cm RV S prime:     14.50 cm/s TAPSE (M-mode): 2.1 cm LEFT ATRIUM             Index        RIGHT ATRIUM           Index LA diam:        4.60 cm 1.85 cm/m   RA Area:     14.50 cm LA Vol (A2C):   66.4 ml 26.65 ml/m  RA Volume:   38.00 ml  15.25 ml/m LA Vol (A4C):   66.3 ml 26.61 ml/m LA Biplane Vol: 71.4 ml 28.66 ml/m  AORTIC VALVE                 PULMONIC VALVE AV Area (Vmax): 2.27 cm     PV Vmax:         1.14 m/s AV Vmax:        160.00 cm/s  PV Peak grad:   5.2 mmHg AV Peak Grad:   10.2 mmHg    RVOT Peak grad: 3 mmHg LVOT Vmax:      105.00 cm/s LVOT Vmean:     70.600 cm/s LVOT VTI:       0.283 m  AORTA Ao Root diam: 3.30 cm MITRAL VALVE               TRICUSPID VALVE MV Area (PHT): 2.55 cm    TR Peak grad:   22.8 mmHg MV Decel Time: 297 msec    TR Vmax:        239.00 cm/s MV E velocity: 82.30 cm/s MV A velocity: 63.40 cm/s  SHUNTS MV E/A ratio:  1.30        Systemic VTI:  0.28 m                            Systemic Diam: 2.10 cm Chilton Si MD Electronically signed by Chilton Si MD Signature Date/Time: 11/22/2022/1:40:39 PM    Final    MR BRAIN WO CONTRAST  Result Date: 11/22/2022 CLINICAL DATA:  Stroke follow-up EXAM: MRI HEAD WITHOUT CONTRAST TECHNIQUE: Multiplanar, multiecho pulse sequences of the brain and surrounding structures were obtained without intravenous contrast. COMPARISON:  None Available. FINDINGS: Brain: There is an area of mild diffusion abnormality within  the right parietal lobe, likely late subacute ischemia. No acute or chronic hemorrhage. There is confluent hyperintense T2-weighted signal within the white matter. Generalized volume loss. The midline structures are normal. Vascular: Major flow voids are preserved. Skull and upper cervical spine: Normal calvarium and skull base. Visualized upper cervical spine and soft tissues are normal. Sinuses/Orbits:Moderate right maxillary sinus mucosal thickening. Normal orbits. IMPRESSION: Small area of late subacute ischemia within the right parietal lobe. No hemorrhage or mass effect. Electronically Signed   By: Deatra Robinson M.D.   On: 11/22/2022 00:57   CT ANGIO HEAD NECK W WO CM (CODE STROKE)  Result Date: 11/21/2022 CLINICAL DATA:  Acute neurologic deficit EXAM: CT ANGIOGRAPHY HEAD AND NECK WITH AND WITHOUT CONTRAST TECHNIQUE: Multidetector CT imaging of the head and neck was performed using the standard protocol during bolus  administration of intravenous contrast. Multiplanar CT image reconstructions and MIPs were obtained to evaluate the vascular anatomy. Carotid stenosis measurements (when applicable) are obtained utilizing NASCET criteria, using the distal internal carotid diameter as the denominator. RADIATION DOSE REDUCTION: This exam was performed according to the departmental dose-optimization program which includes automated exposure control, adjustment of the mA and/or kV according to patient size and/or use of iterative reconstruction technique. CONTRAST:  75mL OMNIPAQUE IOHEXOL 350 MG/ML SOLN COMPARISON:  None Available. FINDINGS: CTA NECK FINDINGS SKELETON: There is no bony spinal canal stenosis. No lytic or blastic lesion. OTHER NECK: Normal pharynx, larynx and major salivary glands. No cervical lymphadenopathy. Unremarkable thyroid gland. UPPER CHEST: No pneumothorax or pleural effusion. No nodules or masses. AORTIC ARCH: There is calcific atherosclerosis of the aortic arch. Conventional 3 vessel aortic branching pattern. RIGHT CAROTID SYSTEM: Normal without aneurysm, dissection or stenosis. LEFT CAROTID SYSTEM: Normal without aneurysm, dissection or stenosis. VERTEBRAL ARTERIES: Left dominant configuration.Both vertebral arteries are normal to the skull base. CTA HEAD FINDINGS POSTERIOR CIRCULATION: --Vertebral arteries: Poor opacification of the right V4 segment, but PICA is patent and normal. Left V4 segment is normal. --Inferior cerebellar arteries: Normal. --Basilar artery: Normal --Superior cerebellar arteries: Normal. --Posterior cerebral arteries (PCA): Normal. ANTERIOR CIRCULATION: --Intracranial internal carotid arteries: Normal. --Anterior cerebral arteries (ACA): Normal. Both A1 segments are present. Patent anterior communicating artery (a-comm). --Middle cerebral arteries (MCA): Normal. VENOUS SINUSES: As permitted by contrast timing, patent. ANATOMIC VARIANTS: None Review of the MIP images confirms the above  findings. IMPRESSION: 1. No emergent large vessel occlusion or high-grade stenosis of the intracranial arteries. Aortic Atherosclerosis (ICD10-I70.0). Electronically Signed   By: Deatra Robinson M.D.   On: 11/21/2022 23:51   CT HEAD CODE STROKE WO CONTRAST  Result Date: 11/21/2022 CLINICAL DATA:  Code stroke.  Acute neurologic deficit EXAM: CT HEAD WITHOUT CONTRAST TECHNIQUE: Contiguous axial images were obtained from the base of the skull through the vertex without intravenous contrast. RADIATION DOSE REDUCTION: This exam was performed according to the departmental dose-optimization program which includes automated exposure control, adjustment of the mA and/or kV according to patient size and/or use of iterative reconstruction technique. COMPARISON:  None Available. FINDINGS: Brain: There is no mass, hemorrhage or extra-axial collection. The size and configuration of the ventricles and extra-axial CSF spaces are normal. There is hypoattenuation of the periventricular white matter, most commonly indicating chronic ischemic microangiopathy. Vascular: Atherosclerotic calcification of the vertebral and internal carotid arteries at the skull base. No abnormal hyperdensity of the major intracranial arteries or dural venous sinuses. Skull: The visualized skull base, calvarium and extracranial soft tissues are normal. Sinuses/Orbits: No fluid levels or advanced mucosal  thickening of the visualized paranasal sinuses. No mastoid or middle ear effusion. The orbits are normal. ASPECTS [facility] Stroke Program Early CT Score) - Ganglionic level infarction (caudate, lentiform nuclei, internal capsule, insula, M1-M3 cortex): 7 - Supraganglionic infarction (M4-M6 cortex): 3 Total score (0-10 with 10 being normal): 10 IMPRESSION: 1. No acute intracranial abnormality. 2. ASPECTS is 10. Electronically Signed   By: Deatra Robinson M.D.   On: 11/21/2022 22:31    Microbiology: Results for orders placed or performed in visit on  12/06/20  Urine Culture     Status: None   Collection Time: 12/06/20 12:00 AM   Specimen: Urine   Urine  Result Value Ref Range Status   Urine Culture, Routine Final report  Final   Organism ID, Bacteria Comment  Final    Comment: Greater than 2 organisms recovered, none predominant. Please submit another sample if clinically indicated. Greater than 100,000 colony forming units per mL   Microscopic Examination     Status: Abnormal   Collection Time: 12/06/20 12:00 AM   Urine  Result Value Ref Range Status   WBC, UA 11-30 (A) 0 - 5 /hpf Final   RBC, Urine 0-2 0 - 2 /hpf Final   Epithelial Cells (non renal) >10 (A) 0 - 10 /hpf Final   Casts None seen None seen /lpf Final   Bacteria, UA Few None seen/Few Final    Labs: CBC: Recent Labs  Lab 11/21/22 2037 11/21/22 2155  WBC 8.7 9.8  NEUTROABS  --  7.3  HGB 13.2 13.8  HCT 40.9 42.5  MCV 86.3 86.7  PLT 271 280   Basic Metabolic Panel: Recent Labs  Lab 11/21/22 2037 11/23/22 0930  NA 132* 132*  K 3.2* 3.7  CL 99 102  CO2 22 23  GLUCOSE 121* 126*  BUN 20 13  CREATININE 1.10* 0.93  CALCIUM 9.0 8.7*  MG  --  2.0  PHOS  --  3.4   Liver Function Tests: Recent Labs  Lab 11/21/22 2037  AST 16  ALT 10  ALKPHOS 50  BILITOT 0.7  PROT 7.9  ALBUMIN 3.9   CBG: Recent Labs  Lab 11/21/22 2153 11/22/22 0050 11/22/22 2022 11/22/22 2340 11/23/22 0356  GLUCAP 147* 123* 98 81 106*    Discharge time spent: greater than 30 minutes.  Signed: Lurene Shadow, MD [facility] 11/24/2022

## 2022-11-24 NOTE — Progress Notes (Addendum)
Physical Therapy Treatment Patient Details Name: Christina Rubio MRN: [id number] DOB: 07/25/1950 Today's Date: 11/24/2022   History of Present Illness Pt admitted to [facility] on 11/21/22 for c/o stroke like symptoms including: LUE numbness and tingling. Code stroke initiated and TNK administered. Imaging significant for: small area of late subacute ischemia within the R parietal lobe. Significant PMH includes: anxiety, hypertension, hyperlipidemia, carpal tunnel, and spinal stenosis.    PT Comments  Pt was long sitting in bed upon arrival. She is A and O x 4 and agreeable top session. Several times during session states that she wants to go home today. She does require assistance to exit bed and stand but once in standing, tolerated gait~ 20 ft without physical assistance. Pt ambulated into BR, urinated, and then ambulated to recliner. Flexed posture noted.  Pt will benefit from continued skilled PT at DC to maximize her independence and safety with all ADLs.    If plan is discharge home, recommend the following: A little help with walking and/or transfers;A little help with bathing/dressing/bathroom;Assistance with cooking/housework;Assist for transportation;Help with stairs or ramp for entrance     Equipment Recommendations  Rollator (4 wheels) (pt requesting bariatric Rollator)       Precautions / Restrictions Precautions Precautions: Fall Restrictions Weight Bearing Restrictions: No     Mobility  Bed Mobility Overal bed mobility: Needs Assistance Bed Mobility: Supine to Sit  Supine to sit: Supervision   Transfers Overall transfer level: Needs assistance Equipment used: Rollator (4 wheels) Transfers: Sit to/from Stand Sit to Stand: From elevated surface, Supervision, Min assist   Ambulation/Gait Ambulation/Gait assistance: Supervision Gait Distance (Feet): 20 Feet Assistive device: Rolling walker (2 wheels) Gait Pattern/deviations: Step-through pattern, Trunk flexed Gait velocity:  decreased  General Gait Details: pt was able to ambulate 2 x 20 ft with rollator. slow flexed gait however pt reports she ambulates like this at baseline    Balance Overall balance assessment: Needs assistance Sitting-balance support: Feet supported, Single extremity supported, Bilateral upper extremity supported Sitting balance-Leahy Scale: Good     Standing balance support: Reliant on assistive device for balance, During functional activity, Bilateral upper extremity supported Standing balance-Leahy Scale: Fair       Cognition Arousal: Alert Behavior During Therapy: WFL for tasks assessed/performed Overall Cognitive Status: Within Functional Limits for tasks assessed    General Comments: pt is A and O x 4. poor safety awareness but does have insight of her deficits               Pertinent Vitals/Pain Pain Assessment Pain Assessment: 0-10 Pain Score: 5  Pain Location: LBP/L knee Pain Descriptors / Indicators: Aching Pain Intervention(s): Limited activity within patient's tolerance, Monitored during session, Premedicated before session, Repositioned     PT Goals (current goals can now be found in the care plan section) Acute Rehab PT Goals Patient Stated Goal: "go home" Progress towards PT goals: Progressing toward goals    Frequency    Min 1X/week           Co-evaluation     PT goals addressed during session: Mobility/safety with mobility;Balance;Proper use of DME;Strengthening/ROM        AM-PAC PT "6 Clicks" Mobility   Outcome Measure  Help needed turning from your back to your side while in a flat bed without using bedrails?: A Little Help needed moving from lying on your back to sitting on the side of a flat bed without using bedrails?: A Little Help needed moving to and from a bed  to a chair (including a wheelchair)?: A Little Help needed standing up from a chair using your arms (e.g., wheelchair or bedside chair)?: A Little Help needed to walk in  hospital room?: A Little Help needed climbing 3-5 steps with a railing? : A Lot 6 Click Score: 17    End of Session   Activity Tolerance: Patient tolerated treatment well Patient left: in chair;with call bell/phone within reach;with chair alarm set Nurse Communication: Mobility status PT Visit Diagnosis: Unsteadiness on feet (R26.81);Repeated falls (R29.6);Muscle weakness (generalized) (M62.81);History of falling (Z91.81);Difficulty in walking, not elsewhere classified (R26.2);Pain Pain - part of body: Knee     Time: 9629-5284 PT Time Calculation (min) (ACUTE ONLY): 24 min  Charges:    $Gait Training: 8-22 mins $Therapeutic Activity: 8-22 mins PT General Charges $$ ACUTE PT VISIT: 1 Visit                    Jetta Lout PTA 11/24/22, 11:28 AM

## 2022-11-24 NOTE — TOC CM/SW Note (Signed)
CSW spoke with [employer] to order durable medical equipment [employer] rolliator to have delivered to patient's home. Order was approve by [place].

## 2022-11-24 NOTE — Care Management Important Message (Signed)
Important Message  Patient Details  Name: Christina Rubio MRN: [id number] Date of Birth: 07/01/1950   Medicare Important Message Given:  N/A - LOS <3 / Initial given by admissions     Johnell Comings 11/24/2022, 8:54 AM

## 2022-11-25 ENCOUNTER — Telehealth: Payer: Self-pay | Admitting: Family Medicine

## 2022-11-25 ENCOUNTER — Other Ambulatory Visit: Payer: Self-pay | Admitting: Family Medicine

## 2022-11-25 ENCOUNTER — Telehealth: Payer: Self-pay

## 2022-11-25 ENCOUNTER — Ambulatory Visit: Payer: Self-pay

## 2022-11-25 MED ORDER — ALPRAZOLAM 0.5 MG PO TABS: ORAL_TABLET | ORAL | 1 refills | Status: DC

## 2022-11-25 NOTE — Telephone Encounter (Signed)
Please review

## 2022-11-25 NOTE — Telephone Encounter (Signed)
Copied from CRM [id number]. Topic: General - Inquiry >> Nov 25, 2022 11:04 AM Runell Gess P wrote: Reason for CRM: pt called saying at her cpe last week Dr. Leonard Schwartz was going to set her up for OT and PT but she has not heard anything.    CB#  [phone]

## 2022-11-25 NOTE — Patient Outreach (Signed)
  Care Coordination   Follow Up Visit Note   11/25/2022 Name: Christina Rubio MRN: [id number] DOB: 06-Feb-1951  Christina Rubio is a 72 y.o. year old female who sees Bacigalupo, Marzella Schlein, MD for primary care. I spoke with  Christina Rubio by phone today.  What matters to the patients health and wellness today?  Patient had a stroke and was discharged with PT/OT.  Patient has made arrangements for personal care.  Patient also wants assistance with obtaining another psychiatrist.    Goals Addressed             This Visit's Progress    Care Coordination Activities       Interventions Today    Flowsheet Row Most Recent Value  General Interventions   General Interventions Discussed/Reviewed General Interventions Discussed, General Interventions Reviewed, Level of Care  [Pt. reports she was discharged from the hospital after a stroke and PT/OT was ordered.  Pt hired a previous Engineer, production for personal care 2 hr, 2-3 days a week.  Pt requesting a new psychiatrist and SW agreed to assist with options.]              SDOH assessments and interventions completed:  No     Care Coordination Interventions:  Yes, provided   Follow up plan: Follow up call scheduled for 12/09/22 at 11:30    Encounter Outcome:  Pt. Visit Completed

## 2022-11-25 NOTE — Patient Instructions (Signed)
Visit Information  Thank you for taking time to visit with me today. Please don't hesitate to contact me if I can be of assistance to you.   Following are the goals we discussed today:  Patient will use in home care providers. Sw will search for options for a new psychiatrist.   Our next appointment is by telephone on 12/09/22 at 11:30 am  Please call the care guide team at [phone] if you need to cancel or reschedule your appointment.   If you are experiencing a Mental Health or Behavioral Health Crisis or need someone to talk to, please call 911  Patient verbalizes understanding of instructions and care plan provided today and agrees to view in MyChart. Active MyChart status and patient understanding of how to access instructions and care plan via MyChart confirmed with patient.     Telephone follow up appointment with care management team member scheduled for: 12/09/22 at 11:30am  Lysle Morales, BSW Social Worker [phone]

## 2022-11-25 NOTE — Telephone Encounter (Signed)
Pt is calling in because she is requesting to have her ALPRAZolam (XANAX) 0.5 MG tablet [[id number]] refilled. Pt says things are extremely stressful for her right now and she really needs the medication especially after her recent stroke. Please advise.

## 2022-11-25 NOTE — Telephone Encounter (Signed)
I've seen the notes in the chart. Thanks for the update.  We'll get her set up for a hospital follow-up appt within 7-14 days of discharge.

## 2022-11-25 NOTE — Telephone Encounter (Signed)
Patient scheduled for hospital follow up 09/05

## 2022-11-25 NOTE — Telephone Encounter (Signed)
Pt is calling in because she had a stroke on Friday and says [facility] took very good care of her but she now has a new condition, AFIB. Pt wanted to make Dr. B aware of that.

## 2022-11-25 NOTE — Telephone Encounter (Signed)
See referral. Looks like it was processed

## 2022-11-26 ENCOUNTER — Ambulatory Visit: Payer: Self-pay | Admitting: *Deleted

## 2022-11-26 NOTE — Telephone Encounter (Signed)
Summary: medication   BuPROPion (WELLBUTRIN XL) 24 hr tablet 300 mg   Question with what has happened to me recently should I continue taking this medication?  Please advise         Chief Complaint: Medication Symptoms: NA Frequency: NA Pertinent Negatives: Patient denies NA Disposition: [] ED /[] Urgent Care (no appt availability in office) / [] Appointment(In office/virtual)/ []  [place] [facility]/ [] Home Care/ [] Refused Recommended Disposition /[] [place] [facility]/ [x]  Follow-up with PCP Additional Notes:  Pt in hospital, CVA. Questioning if she should continue taking Bupropion. States med not listed on AVS from hospital.  Reason for Disposition  [1] Caller has URGENT medicine question about med that PCP or specialist prescribed AND [2] triager unable to answer question  Answer Assessment - Initial Assessment Questions 1. NAME of MEDICINE: "What medicine(s) are you calling about?"     Bupropion 2. QUESTION: "What is your question?" (e.g., double dose of medicine, side effect)     Should I keep taking  Protocols used: Medication Question Call-A-AH

## 2022-11-27 ENCOUNTER — Telehealth (INDEPENDENT_AMBULATORY_CARE_PROVIDER_SITE_OTHER): Payer: Medicare Other | Admitting: Family Medicine

## 2022-11-27 ENCOUNTER — Encounter: Payer: Self-pay | Admitting: Family Medicine

## 2022-11-27 VITALS — BP 95/76 | HR 73

## 2022-11-27 DIAGNOSIS — F329 Major depressive disorder, single episode, unspecified: Secondary | ICD-10-CM

## 2022-11-27 DIAGNOSIS — F411 Generalized anxiety disorder: Secondary | ICD-10-CM

## 2022-11-27 DIAGNOSIS — Z8673 Personal history of transient ischemic attack (TIA), and cerebral infarction without residual deficits: Secondary | ICD-10-CM

## 2022-11-27 DIAGNOSIS — I480 Paroxysmal atrial fibrillation: Secondary | ICD-10-CM | POA: Diagnosis not present

## 2022-11-27 DIAGNOSIS — E871 Hypo-osmolality and hyponatremia: Secondary | ICD-10-CM

## 2022-11-27 DIAGNOSIS — F331 Major depressive disorder, recurrent, moderate: Secondary | ICD-10-CM

## 2022-11-27 DIAGNOSIS — E876 Hypokalemia: Secondary | ICD-10-CM

## 2022-11-27 NOTE — Progress Notes (Signed)
MyChart Video Visit    Virtual Visit via Video Note   This format is felt to be most appropriate for this patient at this time. Physical exam was limited by quality of the video and audio technology used for the visit.    Patient location: home Provider location: [facility] Persons involved in the visit: patient, provider   I discussed the limitations of evaluation and management by telemedicine and the availability of in person appointments. The patient expressed understanding and agreed to proceed.  Patient: Christina Rubio   DOB: 1950-07-02   72 y.o. Female  MRN: [id number] Visit Date: 11/27/2022  Today's healthcare provider: Shirlee Latch, MD   Chief Complaint  Patient presents with   Hospitalization Follow-up    Patient was admitted to [facility] on 11/21/22-11/24/22 due to left arm weakness and numbness she reports symptoms have improved. Patient reports she has not started taking Eliques. Still took aspirin today.    Subjective    HPI HPI     Hospitalization Follow-up    Additional comments: Patient was admitted to [facility] on 11/21/22-11/24/22 due to left arm weakness and numbness she reports symptoms have improved. Patient reports she has not started taking Eliques. Still took aspirin today.       Last edited by Myles Lipps, CMA on 11/27/2022  2:02 PM.      Discussed the use of AI scribe software for clinical note transcription with the patient, who gave verbal consent to proceed.  History of Present Illness   The patient recently experienced a stroke 2/2 Afib (also new diagnosis), presenting with left arm numbness and weakness. They were admitted to the hospital on August 30th, where they received a CT scan and were given TPA, a blood thinner. The patient's symptoms improved significantly after treatment. The patient was also diagnosed with AFib during their hospital stay, which is believed to be the cause of the stroke. The patient also mentions  having issues with home health care and is planning to call [facility]. The patient also mentions having low potassium and sodium levels and is taking a supplement to help with this. The patient also mentions having carpal tunnel syndrome, which affects their ability to perform certain tasks.        Medications: Outpatient Medications Prior to Visit  Medication Sig   ALPRAZolam (XANAX) 0.5 MG tablet TAKE 1 TABLET BY MOUTH EVERY DAY AS NEEDED FOR ANXIETY.   apixaban (ELIQUIS) 5 MG TABS tablet Take 1 tablet (5 mg total) by mouth 2 (two) times daily.   aspirin EC 81 MG tablet Take 1 tablet (81 mg total) by mouth daily for 2 days. Swallow whole.   atorvastatin (LIPITOR) 20 MG tablet TAKE 1 TABLET BY MOUTH DAILY   azelastine (ASTELIN) 0.1 % nasal spray Place 1 spray into both nostrils 2 (two) times daily. Use in each nostril as directed   buPROPion (WELLBUTRIN XL) 300 MG 24 hr tablet Take 300 mg by mouth daily.   levothyroxine (SYNTHROID) 112 MCG tablet TAKE 1 TABLET EVERY DAY ON EMPTY STOMACHWITH A GLASS OF WATER AT LEAST 30-60 MINBEFORE BREAKFAST   loperamide (IMODIUM A-D) 2 MG tablet Take 1 tablet (2 mg total) by mouth 4 (four) times daily as needed for diarrhea or loose stools.   metoprolol succinate (TOPROL-XL) 50 MG 24 hr tablet TAKE 1 TABLET BY MOUTH DAILY WITH OR IMMEDIATELY FOLLOWING A MEAL   Multiple Vitamins-Minerals (PRESERVISION AREDS 2) CAPS Take 1 tablet by mouth 2 (  two) times daily.   Potassium 99 MG TABS Take 1 tablet by mouth daily in the afternoon.   Probiotic Product (PROBIOTIC PO) Take by mouth daily.   sertraline (ZOLOFT) 100 MG tablet TAKE 2 TABLETS BY MOUTH DAILY WITH BREAKFAST   valsartan-hydrochlorothiazide (DIOVAN-HCT) 160-25 MG tablet TAKE 1 TABLET BY MOUTH NIGHTLY   No facility-administered medications prior to visit.    Review of Systems per HPI      Objective    BP 95/76 (BP Location: Left Arm, Patient Position: Sitting, Cuff Size: Large)    Pulse 73       Physical Exam Constitutional:      General: She is not in acute distress.    Appearance: Normal appearance.  HENT:     Head: Normocephalic.  Pulmonary:     Effort: Pulmonary effort is normal. No respiratory distress.  Neurological:     Mental Status: She is alert and oriented to person, place, and time. Mental status is at baseline.       Assessment & Plan     Problem List Items Addressed This Visit       Cardiovascular and Mediastinum   Paroxysmal atrial fibrillation (HCC)     Other   MDD (major depressive disorder)   Relevant Medications   buPROPion (WELLBUTRIN XL) 300 MG 24 hr tablet   GAD (generalized anxiety disorder)   Relevant Medications   buPROPion (WELLBUTRIN XL) 300 MG 24 hr tablet   History of CVA (cerebrovascular accident) - Primary   Other Visit Diagnoses     Hyponatremia       Relevant Orders   Basic Metabolic Panel (BMET)   Hypokalemia       Relevant Orders   Basic Metabolic Panel (BMET)           Acute Ischemic Stroke, new onset Afib Presented with left arm numbness and weakness. CT scan confirmed stroke. TPA administered with significant improvement in symptoms. New diagnosis of atrial fibrillation identified as likely cause of stroke. -Started on Eliquis for stroke prevention secondary to atrial fibrillation. -Continue to monitor for any new or worsening neurological symptoms. -Follow-up with neurology (Dr. Sherryll Burger at [facility]) as outpatient.  Home Health Patient has been discharged home but has not yet been visited by home health services. -Advise patient to contact [facility] to expedite services.  Hypokalemia and Hyponatremia Recent labs showed low potassium and sodium levels. -Order labs to recheck potassium and sodium levels. -Advise patient to continue potassium and magnesium supplement.  Mental Health Patient reports history of depression and anxiety, exacerbated by recent personal  losses. -Continue Wellbutrin as previously prescribed. -Encourage patient to seek new psychiatrist for medication management. -Continue Xanax as needed for anxiety.  General Health Maintenance -Encourage patient to discuss Eliquis with dentist prior to any dental procedures due to bleeding risk. -Advise patient to continue safe practices at home, such as having someone present when showering.        Return for as scheduled.     I discussed the assessment and treatment plan with the patient. The patient was provided an opportunity to ask questions and all were answered. The patient agreed with the plan and demonstrated an understanding of the instructions.   The patient was advised to call back or seek an in-person evaluation if the symptoms worsen or if the condition fails to improve as anticipated.   Shirlee Latch, MD [facility] [phone] (phone) [phone] (fax)  [facility]

## 2022-11-27 NOTE — Telephone Encounter (Signed)
Discussed with patient during appt today.

## 2022-11-28 ENCOUNTER — Ambulatory Visit: Payer: Self-pay

## 2022-11-28 NOTE — Telephone Encounter (Signed)
  Chief Complaint: Low BP readings Symptoms: None - once was dizzy a while ago Frequency: today Pertinent Negatives: Patient denies  Disposition: [x] ED /[] Urgent Care (no appt availability in office) / [] Appointment(In office/virtual)/ []  [place] [facility]/ [] Home Care/ [] Refused Recommended Disposition /[] [place] Mobile [facility]/ []  Follow-up with PCP Additional Notes: Pt states she took her BP today and readings were 72/38 and 62/40. Pulse of 50. Unclear if pt is using automatic cuff properly. Pt states she has very large arms and so uses cuff on her wrist. No dizziness or other s/s - recent stroke. Pt will call EMS.  Reason for Disposition  [1] Systolic BP < 80 AND [2] NOT dizzy, lightheaded or weak  Answer Assessment - Initial Assessment Questions 1. BLOOD PRESSURE: "What is the blood pressure?" "Did you take at least two measurements 5 minutes apart?"     72/35 taken this morning,  62/40  an hour before  2. ONSET: "When did you take your blood pressure?"     This morning 3. HOW: "How did you obtain the blood pressure?" (e.g., visiting nurse, automatic home BP monitor)     automatic 4. HISTORY: "Do you have a history of low blood pressure?" "What is your blood pressure normally?"     High bp 5. MEDICINES: "Are you taking any medications for blood pressure?" If Yes, ask: "Have they been changed recently?"     yes 6. PULSE RATE: "Do you know what your pulse rate is?"      50 7. OTHER SYMPTOMS: "Have you been sick recently?" "Have you had a recent injury?"     Recent stroke  Protocols used: Blood Pressure - Low-A-AH

## 2022-12-01 ENCOUNTER — Telehealth: Payer: Self-pay

## 2022-12-01 DIAGNOSIS — F419 Anxiety disorder, unspecified: Secondary | ICD-10-CM | POA: Diagnosis not present

## 2022-12-01 DIAGNOSIS — E785 Hyperlipidemia, unspecified: Secondary | ICD-10-CM | POA: Diagnosis not present

## 2022-12-01 DIAGNOSIS — M810 Age-related osteoporosis without current pathological fracture: Secondary | ICD-10-CM | POA: Diagnosis not present

## 2022-12-01 DIAGNOSIS — E876 Hypokalemia: Secondary | ICD-10-CM | POA: Diagnosis not present

## 2022-12-01 DIAGNOSIS — Z6841 Body Mass Index (BMI) 40.0 and over, adult: Secondary | ICD-10-CM | POA: Diagnosis not present

## 2022-12-01 DIAGNOSIS — F32A Depression, unspecified: Secondary | ICD-10-CM | POA: Diagnosis not present

## 2022-12-01 DIAGNOSIS — Z7901 Long term (current) use of anticoagulants: Secondary | ICD-10-CM | POA: Diagnosis not present

## 2022-12-01 DIAGNOSIS — Z85828 Personal history of other malignant neoplasm of skin: Secondary | ICD-10-CM | POA: Diagnosis not present

## 2022-12-01 DIAGNOSIS — I480 Paroxysmal atrial fibrillation: Secondary | ICD-10-CM | POA: Diagnosis not present

## 2022-12-01 DIAGNOSIS — I679 Cerebrovascular disease, unspecified: Secondary | ICD-10-CM | POA: Diagnosis not present

## 2022-12-01 DIAGNOSIS — E1122 Type 2 diabetes mellitus with diabetic chronic kidney disease: Secondary | ICD-10-CM | POA: Diagnosis not present

## 2022-12-01 DIAGNOSIS — E1159 Type 2 diabetes mellitus with other circulatory complications: Secondary | ICD-10-CM | POA: Diagnosis not present

## 2022-12-01 DIAGNOSIS — E890 Postprocedural hypothyroidism: Secondary | ICD-10-CM | POA: Diagnosis not present

## 2022-12-01 DIAGNOSIS — N1831 Chronic kidney disease, stage 3a: Secondary | ICD-10-CM | POA: Diagnosis not present

## 2022-12-01 DIAGNOSIS — I890 Lymphedema, not elsewhere classified: Secondary | ICD-10-CM | POA: Diagnosis not present

## 2022-12-01 DIAGNOSIS — I152 Hypertension secondary to endocrine disorders: Secondary | ICD-10-CM | POA: Diagnosis not present

## 2022-12-01 DIAGNOSIS — M1990 Unspecified osteoarthritis, unspecified site: Secondary | ICD-10-CM | POA: Diagnosis not present

## 2022-12-01 DIAGNOSIS — G709 Myoneural disorder, unspecified: Secondary | ICD-10-CM | POA: Diagnosis not present

## 2022-12-01 DIAGNOSIS — I69354 Hemiplegia and hemiparesis following cerebral infarction affecting left non-dominant side: Secondary | ICD-10-CM | POA: Diagnosis not present

## 2022-12-01 DIAGNOSIS — N179 Acute kidney failure, unspecified: Secondary | ICD-10-CM | POA: Diagnosis not present

## 2022-12-01 DIAGNOSIS — Z7984 Long term (current) use of oral hypoglycemic drugs: Secondary | ICD-10-CM | POA: Diagnosis not present

## 2022-12-01 NOTE — Telephone Encounter (Signed)
Copied from CRM [id number]. Topic: General - Other >> Dec 01, 2022  3:47 PM Epimenio Foot F wrote: Reason for CRM: Home Health Verbal Orders - Caller/Agency: [facility] Callback Number: [phone]  Requesting OT/PT/Skilled Nursing/Social Work/Speech Therapy: PT  Frequency: 2w4, 1w4

## 2022-12-02 DIAGNOSIS — N1831 Chronic kidney disease, stage 3a: Secondary | ICD-10-CM | POA: Diagnosis not present

## 2022-12-02 DIAGNOSIS — M810 Age-related osteoporosis without current pathological fracture: Secondary | ICD-10-CM | POA: Diagnosis not present

## 2022-12-02 DIAGNOSIS — G709 Myoneural disorder, unspecified: Secondary | ICD-10-CM | POA: Diagnosis not present

## 2022-12-02 DIAGNOSIS — E785 Hyperlipidemia, unspecified: Secondary | ICD-10-CM | POA: Diagnosis not present

## 2022-12-02 DIAGNOSIS — M1990 Unspecified osteoarthritis, unspecified site: Secondary | ICD-10-CM | POA: Diagnosis not present

## 2022-12-02 DIAGNOSIS — F32A Depression, unspecified: Secondary | ICD-10-CM | POA: Diagnosis not present

## 2022-12-02 DIAGNOSIS — E1159 Type 2 diabetes mellitus with other circulatory complications: Secondary | ICD-10-CM | POA: Diagnosis not present

## 2022-12-02 DIAGNOSIS — I890 Lymphedema, not elsewhere classified: Secondary | ICD-10-CM | POA: Diagnosis not present

## 2022-12-02 DIAGNOSIS — E876 Hypokalemia: Secondary | ICD-10-CM | POA: Diagnosis not present

## 2022-12-02 DIAGNOSIS — I69354 Hemiplegia and hemiparesis following cerebral infarction affecting left non-dominant side: Secondary | ICD-10-CM | POA: Diagnosis not present

## 2022-12-02 DIAGNOSIS — F419 Anxiety disorder, unspecified: Secondary | ICD-10-CM | POA: Diagnosis not present

## 2022-12-02 DIAGNOSIS — N179 Acute kidney failure, unspecified: Secondary | ICD-10-CM | POA: Diagnosis not present

## 2022-12-02 DIAGNOSIS — E1122 Type 2 diabetes mellitus with diabetic chronic kidney disease: Secondary | ICD-10-CM | POA: Diagnosis not present

## 2022-12-02 DIAGNOSIS — I152 Hypertension secondary to endocrine disorders: Secondary | ICD-10-CM | POA: Diagnosis not present

## 2022-12-02 DIAGNOSIS — I480 Paroxysmal atrial fibrillation: Secondary | ICD-10-CM | POA: Diagnosis not present

## 2022-12-02 DIAGNOSIS — E890 Postprocedural hypothyroidism: Secondary | ICD-10-CM | POA: Diagnosis not present

## 2022-12-02 NOTE — Telephone Encounter (Signed)
Verbal orders given to Christina Rubio with Home Health

## 2022-12-02 NOTE — Telephone Encounter (Signed)
OK for verbals

## 2022-12-03 ENCOUNTER — Telehealth: Payer: Self-pay | Admitting: Family Medicine

## 2022-12-03 NOTE — Telephone Encounter (Signed)
Home Health Verbal Orders - Caller/Agency: Junious Dresser from [facility] Callback Number: [phone] Requesting OT, home health aid  Frequency: 1x6w; assist with bathing 1x8w

## 2022-12-05 ENCOUNTER — Telehealth: Payer: Self-pay | Admitting: Family Medicine

## 2022-12-05 DIAGNOSIS — M1990 Unspecified osteoarthritis, unspecified site: Secondary | ICD-10-CM | POA: Diagnosis not present

## 2022-12-05 DIAGNOSIS — E890 Postprocedural hypothyroidism: Secondary | ICD-10-CM | POA: Diagnosis not present

## 2022-12-05 DIAGNOSIS — E785 Hyperlipidemia, unspecified: Secondary | ICD-10-CM | POA: Diagnosis not present

## 2022-12-05 DIAGNOSIS — E1159 Type 2 diabetes mellitus with other circulatory complications: Secondary | ICD-10-CM | POA: Diagnosis not present

## 2022-12-05 DIAGNOSIS — M810 Age-related osteoporosis without current pathological fracture: Secondary | ICD-10-CM | POA: Diagnosis not present

## 2022-12-05 DIAGNOSIS — F32A Depression, unspecified: Secondary | ICD-10-CM | POA: Diagnosis not present

## 2022-12-05 DIAGNOSIS — I890 Lymphedema, not elsewhere classified: Secondary | ICD-10-CM | POA: Diagnosis not present

## 2022-12-05 DIAGNOSIS — G709 Myoneural disorder, unspecified: Secondary | ICD-10-CM | POA: Diagnosis not present

## 2022-12-05 DIAGNOSIS — N1831 Chronic kidney disease, stage 3a: Secondary | ICD-10-CM | POA: Diagnosis not present

## 2022-12-05 DIAGNOSIS — I152 Hypertension secondary to endocrine disorders: Secondary | ICD-10-CM | POA: Diagnosis not present

## 2022-12-05 DIAGNOSIS — E1122 Type 2 diabetes mellitus with diabetic chronic kidney disease: Secondary | ICD-10-CM | POA: Diagnosis not present

## 2022-12-05 DIAGNOSIS — E876 Hypokalemia: Secondary | ICD-10-CM | POA: Diagnosis not present

## 2022-12-05 DIAGNOSIS — I480 Paroxysmal atrial fibrillation: Secondary | ICD-10-CM | POA: Diagnosis not present

## 2022-12-05 DIAGNOSIS — F419 Anxiety disorder, unspecified: Secondary | ICD-10-CM | POA: Diagnosis not present

## 2022-12-05 DIAGNOSIS — I69354 Hemiplegia and hemiparesis following cerebral infarction affecting left non-dominant side: Secondary | ICD-10-CM | POA: Diagnosis not present

## 2022-12-05 DIAGNOSIS — N179 Acute kidney failure, unspecified: Secondary | ICD-10-CM | POA: Diagnosis not present

## 2022-12-05 NOTE — Telephone Encounter (Signed)
Rerouted verbals to Dr. Leonard Schwartz.

## 2022-12-05 NOTE — Telephone Encounter (Signed)
OK for verbals

## 2022-12-05 NOTE — Telephone Encounter (Signed)
Verbals orders given to [facility] w/ [facility].

## 2022-12-05 NOTE — Telephone Encounter (Signed)
Connie from [place] called to f/u on verbal orders for patient. Please f/u with Connine

## 2022-12-09 ENCOUNTER — Ambulatory Visit: Payer: Self-pay

## 2022-12-09 DIAGNOSIS — E890 Postprocedural hypothyroidism: Secondary | ICD-10-CM | POA: Diagnosis not present

## 2022-12-09 DIAGNOSIS — F32A Depression, unspecified: Secondary | ICD-10-CM | POA: Diagnosis not present

## 2022-12-09 DIAGNOSIS — I480 Paroxysmal atrial fibrillation: Secondary | ICD-10-CM | POA: Diagnosis not present

## 2022-12-09 DIAGNOSIS — E1159 Type 2 diabetes mellitus with other circulatory complications: Secondary | ICD-10-CM | POA: Diagnosis not present

## 2022-12-09 DIAGNOSIS — M1990 Unspecified osteoarthritis, unspecified site: Secondary | ICD-10-CM | POA: Diagnosis not present

## 2022-12-09 DIAGNOSIS — F419 Anxiety disorder, unspecified: Secondary | ICD-10-CM | POA: Diagnosis not present

## 2022-12-09 DIAGNOSIS — E785 Hyperlipidemia, unspecified: Secondary | ICD-10-CM | POA: Diagnosis not present

## 2022-12-09 DIAGNOSIS — N1831 Chronic kidney disease, stage 3a: Secondary | ICD-10-CM | POA: Diagnosis not present

## 2022-12-09 DIAGNOSIS — M810 Age-related osteoporosis without current pathological fracture: Secondary | ICD-10-CM | POA: Diagnosis not present

## 2022-12-09 DIAGNOSIS — N179 Acute kidney failure, unspecified: Secondary | ICD-10-CM | POA: Diagnosis not present

## 2022-12-09 DIAGNOSIS — G709 Myoneural disorder, unspecified: Secondary | ICD-10-CM | POA: Diagnosis not present

## 2022-12-09 DIAGNOSIS — I69354 Hemiplegia and hemiparesis following cerebral infarction affecting left non-dominant side: Secondary | ICD-10-CM | POA: Diagnosis not present

## 2022-12-09 DIAGNOSIS — I152 Hypertension secondary to endocrine disorders: Secondary | ICD-10-CM | POA: Diagnosis not present

## 2022-12-09 DIAGNOSIS — E876 Hypokalemia: Secondary | ICD-10-CM | POA: Diagnosis not present

## 2022-12-09 DIAGNOSIS — E1122 Type 2 diabetes mellitus with diabetic chronic kidney disease: Secondary | ICD-10-CM | POA: Diagnosis not present

## 2022-12-09 DIAGNOSIS — I890 Lymphedema, not elsewhere classified: Secondary | ICD-10-CM | POA: Diagnosis not present

## 2022-12-09 NOTE — Patient Instructions (Signed)
Visit Information  Thank you for taking time to visit with me today. Please don't hesitate to contact me if I can be of assistance to you.   Following are the goals we discussed today:  Patient to follow up with [facility] for an appointment.   Our next appointment is by telephone on 12/22/22 at 2:30pm  Please call the care guide team at [phone] if you need to cancel or reschedule your appointment.   If you are experiencing a Mental Health or Behavioral Health Crisis or need someone to talk to, please call 911  Patient verbalizes understanding of instructions and care plan provided today and agrees to view in MyChart. Active MyChart status and patient understanding of how to access instructions and care plan via MyChart confirmed with patient.     Telephone follow up appointment with care management team member scheduled for: 12/22/22 at 2:30pm  Lysle Morales, BSW Social Worker [phone]

## 2022-12-09 NOTE — Patient Outreach (Signed)
Care Coordination   Follow Up Visit Note   12/09/2022 Name: Christina Rubio MRN: [id number] DOB: Sep 20, 1950  Christina Rubio is a 72 y.o. year old female who sees Bacigalupo, Marzella Schlein, MD for primary care. I spoke with  [name] by phone today.  What matters to the patients health and wellness today?  Patient is pleased with [facility] for personal care with is excited that OT/PT starts today.    Goals Addressed             This Visit's Progress    Care Coordination Activities       Interventions Today    Flowsheet Row Most Recent Value  General Interventions   General Interventions Discussed/Reviewed General Interventions Discussed, General Interventions Reviewed, Level of Care, Doctor Visits  [Pt reports pain w/her left knee and plans to f/up with provider soon for specialist apt.]  Level of Care Personal Care Services  [Pt reports OT/PT starts today and will inquire on the start date for the bath nurse. Pt enrolled w/[facility] and is extremely pleased with the aide. Pt was not able to use her friend for personal care.]  Mental Health Interventions   Mental Health Discussed/Reviewed Other  [Pt plans to follow up with [facility] Psychiatrist to request vitrual visits and quarterly in person.  Sw agreed to provider additional options if pt not able to coordinate with [facility] Psychiatrist.]              SDOH assessments and interventions completed:  No     Care Coordination Interventions:  Yes, provided   Follow up plan: Follow up call scheduled for 12/22/22 at 2:30pm    Encounter Outcome:  Patient Visit Completed

## 2022-12-10 DIAGNOSIS — I890 Lymphedema, not elsewhere classified: Secondary | ICD-10-CM | POA: Diagnosis not present

## 2022-12-10 DIAGNOSIS — F419 Anxiety disorder, unspecified: Secondary | ICD-10-CM | POA: Diagnosis not present

## 2022-12-10 DIAGNOSIS — E785 Hyperlipidemia, unspecified: Secondary | ICD-10-CM | POA: Diagnosis not present

## 2022-12-10 DIAGNOSIS — M810 Age-related osteoporosis without current pathological fracture: Secondary | ICD-10-CM | POA: Diagnosis not present

## 2022-12-10 DIAGNOSIS — F32A Depression, unspecified: Secondary | ICD-10-CM | POA: Diagnosis not present

## 2022-12-10 DIAGNOSIS — N1831 Chronic kidney disease, stage 3a: Secondary | ICD-10-CM | POA: Diagnosis not present

## 2022-12-10 DIAGNOSIS — E890 Postprocedural hypothyroidism: Secondary | ICD-10-CM | POA: Diagnosis not present

## 2022-12-10 DIAGNOSIS — M1990 Unspecified osteoarthritis, unspecified site: Secondary | ICD-10-CM | POA: Diagnosis not present

## 2022-12-10 DIAGNOSIS — N179 Acute kidney failure, unspecified: Secondary | ICD-10-CM | POA: Diagnosis not present

## 2022-12-10 DIAGNOSIS — I480 Paroxysmal atrial fibrillation: Secondary | ICD-10-CM | POA: Diagnosis not present

## 2022-12-10 DIAGNOSIS — I152 Hypertension secondary to endocrine disorders: Secondary | ICD-10-CM | POA: Diagnosis not present

## 2022-12-10 DIAGNOSIS — E876 Hypokalemia: Secondary | ICD-10-CM | POA: Diagnosis not present

## 2022-12-10 DIAGNOSIS — I69354 Hemiplegia and hemiparesis following cerebral infarction affecting left non-dominant side: Secondary | ICD-10-CM | POA: Diagnosis not present

## 2022-12-10 DIAGNOSIS — E1122 Type 2 diabetes mellitus with diabetic chronic kidney disease: Secondary | ICD-10-CM | POA: Diagnosis not present

## 2022-12-10 DIAGNOSIS — G709 Myoneural disorder, unspecified: Secondary | ICD-10-CM | POA: Diagnosis not present

## 2022-12-10 DIAGNOSIS — E1159 Type 2 diabetes mellitus with other circulatory complications: Secondary | ICD-10-CM | POA: Diagnosis not present

## 2022-12-16 DIAGNOSIS — G709 Myoneural disorder, unspecified: Secondary | ICD-10-CM | POA: Diagnosis not present

## 2022-12-16 DIAGNOSIS — M810 Age-related osteoporosis without current pathological fracture: Secondary | ICD-10-CM | POA: Diagnosis not present

## 2022-12-16 DIAGNOSIS — F32A Depression, unspecified: Secondary | ICD-10-CM | POA: Diagnosis not present

## 2022-12-16 DIAGNOSIS — E876 Hypokalemia: Secondary | ICD-10-CM | POA: Diagnosis not present

## 2022-12-16 DIAGNOSIS — N1831 Chronic kidney disease, stage 3a: Secondary | ICD-10-CM | POA: Diagnosis not present

## 2022-12-16 DIAGNOSIS — N179 Acute kidney failure, unspecified: Secondary | ICD-10-CM | POA: Diagnosis not present

## 2022-12-16 DIAGNOSIS — E785 Hyperlipidemia, unspecified: Secondary | ICD-10-CM | POA: Diagnosis not present

## 2022-12-16 DIAGNOSIS — E1159 Type 2 diabetes mellitus with other circulatory complications: Secondary | ICD-10-CM | POA: Diagnosis not present

## 2022-12-16 DIAGNOSIS — I480 Paroxysmal atrial fibrillation: Secondary | ICD-10-CM | POA: Diagnosis not present

## 2022-12-16 DIAGNOSIS — I890 Lymphedema, not elsewhere classified: Secondary | ICD-10-CM | POA: Diagnosis not present

## 2022-12-16 DIAGNOSIS — F419 Anxiety disorder, unspecified: Secondary | ICD-10-CM | POA: Diagnosis not present

## 2022-12-16 DIAGNOSIS — E1122 Type 2 diabetes mellitus with diabetic chronic kidney disease: Secondary | ICD-10-CM | POA: Diagnosis not present

## 2022-12-16 DIAGNOSIS — I69354 Hemiplegia and hemiparesis following cerebral infarction affecting left non-dominant side: Secondary | ICD-10-CM | POA: Diagnosis not present

## 2022-12-16 DIAGNOSIS — M1990 Unspecified osteoarthritis, unspecified site: Secondary | ICD-10-CM | POA: Diagnosis not present

## 2022-12-16 DIAGNOSIS — E890 Postprocedural hypothyroidism: Secondary | ICD-10-CM | POA: Diagnosis not present

## 2022-12-16 DIAGNOSIS — I152 Hypertension secondary to endocrine disorders: Secondary | ICD-10-CM | POA: Diagnosis not present

## 2022-12-17 DIAGNOSIS — M810 Age-related osteoporosis without current pathological fracture: Secondary | ICD-10-CM | POA: Diagnosis not present

## 2022-12-17 DIAGNOSIS — M1990 Unspecified osteoarthritis, unspecified site: Secondary | ICD-10-CM | POA: Diagnosis not present

## 2022-12-17 DIAGNOSIS — I69354 Hemiplegia and hemiparesis following cerebral infarction affecting left non-dominant side: Secondary | ICD-10-CM | POA: Diagnosis not present

## 2022-12-17 DIAGNOSIS — I480 Paroxysmal atrial fibrillation: Secondary | ICD-10-CM | POA: Diagnosis not present

## 2022-12-17 DIAGNOSIS — F419 Anxiety disorder, unspecified: Secondary | ICD-10-CM | POA: Diagnosis not present

## 2022-12-17 DIAGNOSIS — E890 Postprocedural hypothyroidism: Secondary | ICD-10-CM | POA: Diagnosis not present

## 2022-12-17 DIAGNOSIS — I890 Lymphedema, not elsewhere classified: Secondary | ICD-10-CM | POA: Diagnosis not present

## 2022-12-17 DIAGNOSIS — N179 Acute kidney failure, unspecified: Secondary | ICD-10-CM | POA: Diagnosis not present

## 2022-12-17 DIAGNOSIS — G709 Myoneural disorder, unspecified: Secondary | ICD-10-CM | POA: Diagnosis not present

## 2022-12-17 DIAGNOSIS — F32A Depression, unspecified: Secondary | ICD-10-CM | POA: Diagnosis not present

## 2022-12-17 DIAGNOSIS — E876 Hypokalemia: Secondary | ICD-10-CM | POA: Diagnosis not present

## 2022-12-17 DIAGNOSIS — E1122 Type 2 diabetes mellitus with diabetic chronic kidney disease: Secondary | ICD-10-CM | POA: Diagnosis not present

## 2022-12-17 DIAGNOSIS — E1159 Type 2 diabetes mellitus with other circulatory complications: Secondary | ICD-10-CM | POA: Diagnosis not present

## 2022-12-17 DIAGNOSIS — E785 Hyperlipidemia, unspecified: Secondary | ICD-10-CM | POA: Diagnosis not present

## 2022-12-17 DIAGNOSIS — N1831 Chronic kidney disease, stage 3a: Secondary | ICD-10-CM | POA: Diagnosis not present

## 2022-12-17 DIAGNOSIS — I152 Hypertension secondary to endocrine disorders: Secondary | ICD-10-CM | POA: Diagnosis not present

## 2022-12-22 ENCOUNTER — Ambulatory Visit: Payer: Self-pay

## 2022-12-22 NOTE — Patient Instructions (Signed)
Visit Information  Thank you for taking time to visit with me today. Please don't hesitate to contact me if I can be of assistance to you.   Following are the goals we discussed today:  Patient to contact [facility] to establish appointment.   Our next appointment is by telephone on 12/30/22 at 2:30pm  Please call the care guide team at [phone] if you need to cancel or reschedule your appointment.   If you are experiencing a Mental Health or Behavioral Health Crisis or need someone to talk to, please call 911  Patient verbalizes understanding of instructions and care plan provided today and agrees to view in MyChart. Active MyChart status and patient understanding of how to access instructions and care plan via MyChart confirmed with patient.     Follow up with provider re: 12/30/22 at 2:30pm.  Lysle Morales, BSW Social Worker [phone]

## 2022-12-22 NOTE — Patient Outreach (Signed)
Care Coordination   Follow Up Visit Note   12/22/2022 Name: Christina Rubio MRN: [id number] DOB: 12/18/1950  Christina Rubio is a 72 y.o. year old female who sees Bacigalupo, Marzella Schlein, MD for primary care. I spoke with  Christina Rubio by phone today.  What matters to the patients health and wellness today?  Patient need mental health services.    Goals Addressed             This Visit's Progress    Care Coordination Activities       Interventions Today    Flowsheet Row Most Recent Value  Chronic Disease   Chronic disease during today's visit Diabetes  General Interventions   General Interventions Discussed/Reviewed General Interventions Discussed, General Interventions Reviewed  [Pt plans to establish [facility] services w/[facility]. Pt called but did not make an appt. Pt agreed to call back by the end of the week to make an appt.]              SDOH assessments and interventions completed:  No     Care Coordination Interventions:  Yes, provided   Follow up plan: Follow up call scheduled for 12/30/22 at 2:30pm    Encounter Outcome:  Patient Visit Completed

## 2022-12-23 DIAGNOSIS — G709 Myoneural disorder, unspecified: Secondary | ICD-10-CM | POA: Diagnosis not present

## 2022-12-23 DIAGNOSIS — N1831 Chronic kidney disease, stage 3a: Secondary | ICD-10-CM | POA: Diagnosis not present

## 2022-12-23 DIAGNOSIS — N179 Acute kidney failure, unspecified: Secondary | ICD-10-CM | POA: Diagnosis not present

## 2022-12-23 DIAGNOSIS — M1990 Unspecified osteoarthritis, unspecified site: Secondary | ICD-10-CM | POA: Diagnosis not present

## 2022-12-23 DIAGNOSIS — E1122 Type 2 diabetes mellitus with diabetic chronic kidney disease: Secondary | ICD-10-CM | POA: Diagnosis not present

## 2022-12-23 DIAGNOSIS — F419 Anxiety disorder, unspecified: Secondary | ICD-10-CM | POA: Diagnosis not present

## 2022-12-23 DIAGNOSIS — E785 Hyperlipidemia, unspecified: Secondary | ICD-10-CM | POA: Diagnosis not present

## 2022-12-23 DIAGNOSIS — I69354 Hemiplegia and hemiparesis following cerebral infarction affecting left non-dominant side: Secondary | ICD-10-CM | POA: Diagnosis not present

## 2022-12-23 DIAGNOSIS — F32A Depression, unspecified: Secondary | ICD-10-CM | POA: Diagnosis not present

## 2022-12-23 DIAGNOSIS — I890 Lymphedema, not elsewhere classified: Secondary | ICD-10-CM | POA: Diagnosis not present

## 2022-12-23 DIAGNOSIS — I152 Hypertension secondary to endocrine disorders: Secondary | ICD-10-CM | POA: Diagnosis not present

## 2022-12-23 DIAGNOSIS — M810 Age-related osteoporosis without current pathological fracture: Secondary | ICD-10-CM | POA: Diagnosis not present

## 2022-12-23 DIAGNOSIS — E1159 Type 2 diabetes mellitus with other circulatory complications: Secondary | ICD-10-CM | POA: Diagnosis not present

## 2022-12-23 DIAGNOSIS — E890 Postprocedural hypothyroidism: Secondary | ICD-10-CM | POA: Diagnosis not present

## 2022-12-23 DIAGNOSIS — I480 Paroxysmal atrial fibrillation: Secondary | ICD-10-CM | POA: Diagnosis not present

## 2022-12-23 DIAGNOSIS — E876 Hypokalemia: Secondary | ICD-10-CM | POA: Diagnosis not present

## 2022-12-23 NOTE — Addendum Note (Signed)
Addended by: Erasmo Downer on: 12/23/2022 12:57 PM   Modules accepted: Level of Service

## 2022-12-26 DIAGNOSIS — G709 Myoneural disorder, unspecified: Secondary | ICD-10-CM | POA: Diagnosis not present

## 2022-12-26 DIAGNOSIS — E890 Postprocedural hypothyroidism: Secondary | ICD-10-CM | POA: Diagnosis not present

## 2022-12-26 DIAGNOSIS — E876 Hypokalemia: Secondary | ICD-10-CM | POA: Diagnosis not present

## 2022-12-26 DIAGNOSIS — I69354 Hemiplegia and hemiparesis following cerebral infarction affecting left non-dominant side: Secondary | ICD-10-CM | POA: Diagnosis not present

## 2022-12-26 DIAGNOSIS — N179 Acute kidney failure, unspecified: Secondary | ICD-10-CM | POA: Diagnosis not present

## 2022-12-26 DIAGNOSIS — E1159 Type 2 diabetes mellitus with other circulatory complications: Secondary | ICD-10-CM | POA: Diagnosis not present

## 2022-12-26 DIAGNOSIS — M1990 Unspecified osteoarthritis, unspecified site: Secondary | ICD-10-CM | POA: Diagnosis not present

## 2022-12-26 DIAGNOSIS — M810 Age-related osteoporosis without current pathological fracture: Secondary | ICD-10-CM | POA: Diagnosis not present

## 2022-12-26 DIAGNOSIS — F32A Depression, unspecified: Secondary | ICD-10-CM | POA: Diagnosis not present

## 2022-12-26 DIAGNOSIS — N1831 Chronic kidney disease, stage 3a: Secondary | ICD-10-CM | POA: Diagnosis not present

## 2022-12-26 DIAGNOSIS — F419 Anxiety disorder, unspecified: Secondary | ICD-10-CM | POA: Diagnosis not present

## 2022-12-26 DIAGNOSIS — I152 Hypertension secondary to endocrine disorders: Secondary | ICD-10-CM | POA: Diagnosis not present

## 2022-12-26 DIAGNOSIS — I890 Lymphedema, not elsewhere classified: Secondary | ICD-10-CM | POA: Diagnosis not present

## 2022-12-26 DIAGNOSIS — E1122 Type 2 diabetes mellitus with diabetic chronic kidney disease: Secondary | ICD-10-CM | POA: Diagnosis not present

## 2022-12-26 DIAGNOSIS — I480 Paroxysmal atrial fibrillation: Secondary | ICD-10-CM | POA: Diagnosis not present

## 2022-12-26 DIAGNOSIS — E785 Hyperlipidemia, unspecified: Secondary | ICD-10-CM | POA: Diagnosis not present

## 2022-12-29 DIAGNOSIS — I890 Lymphedema, not elsewhere classified: Secondary | ICD-10-CM

## 2022-12-29 DIAGNOSIS — I152 Hypertension secondary to endocrine disorders: Secondary | ICD-10-CM

## 2022-12-29 DIAGNOSIS — E890 Postprocedural hypothyroidism: Secondary | ICD-10-CM

## 2022-12-29 DIAGNOSIS — E876 Hypokalemia: Secondary | ICD-10-CM

## 2022-12-29 DIAGNOSIS — N179 Acute kidney failure, unspecified: Secondary | ICD-10-CM | POA: Diagnosis not present

## 2022-12-29 DIAGNOSIS — I480 Paroxysmal atrial fibrillation: Secondary | ICD-10-CM

## 2022-12-29 DIAGNOSIS — E1122 Type 2 diabetes mellitus with diabetic chronic kidney disease: Secondary | ICD-10-CM | POA: Diagnosis not present

## 2022-12-29 DIAGNOSIS — I69354 Hemiplegia and hemiparesis following cerebral infarction affecting left non-dominant side: Secondary | ICD-10-CM | POA: Diagnosis not present

## 2022-12-29 DIAGNOSIS — G709 Myoneural disorder, unspecified: Secondary | ICD-10-CM

## 2022-12-29 DIAGNOSIS — E1159 Type 2 diabetes mellitus with other circulatory complications: Secondary | ICD-10-CM

## 2022-12-29 DIAGNOSIS — E785 Hyperlipidemia, unspecified: Secondary | ICD-10-CM

## 2022-12-29 DIAGNOSIS — N1831 Chronic kidney disease, stage 3a: Secondary | ICD-10-CM | POA: Diagnosis not present

## 2022-12-30 ENCOUNTER — Ambulatory Visit: Payer: Self-pay

## 2022-12-30 DIAGNOSIS — E1122 Type 2 diabetes mellitus with diabetic chronic kidney disease: Secondary | ICD-10-CM | POA: Diagnosis not present

## 2022-12-30 DIAGNOSIS — F419 Anxiety disorder, unspecified: Secondary | ICD-10-CM | POA: Diagnosis not present

## 2022-12-30 DIAGNOSIS — I152 Hypertension secondary to endocrine disorders: Secondary | ICD-10-CM | POA: Diagnosis not present

## 2022-12-30 DIAGNOSIS — I890 Lymphedema, not elsewhere classified: Secondary | ICD-10-CM | POA: Diagnosis not present

## 2022-12-30 DIAGNOSIS — N1831 Chronic kidney disease, stage 3a: Secondary | ICD-10-CM | POA: Diagnosis not present

## 2022-12-30 DIAGNOSIS — E876 Hypokalemia: Secondary | ICD-10-CM | POA: Diagnosis not present

## 2022-12-30 DIAGNOSIS — I69354 Hemiplegia and hemiparesis following cerebral infarction affecting left non-dominant side: Secondary | ICD-10-CM | POA: Diagnosis not present

## 2022-12-30 DIAGNOSIS — G709 Myoneural disorder, unspecified: Secondary | ICD-10-CM | POA: Diagnosis not present

## 2022-12-30 DIAGNOSIS — M810 Age-related osteoporosis without current pathological fracture: Secondary | ICD-10-CM | POA: Diagnosis not present

## 2022-12-30 DIAGNOSIS — E890 Postprocedural hypothyroidism: Secondary | ICD-10-CM | POA: Diagnosis not present

## 2022-12-30 DIAGNOSIS — N179 Acute kidney failure, unspecified: Secondary | ICD-10-CM | POA: Diagnosis not present

## 2022-12-30 DIAGNOSIS — M1990 Unspecified osteoarthritis, unspecified site: Secondary | ICD-10-CM | POA: Diagnosis not present

## 2022-12-30 DIAGNOSIS — E1159 Type 2 diabetes mellitus with other circulatory complications: Secondary | ICD-10-CM | POA: Diagnosis not present

## 2022-12-30 DIAGNOSIS — F32A Depression, unspecified: Secondary | ICD-10-CM | POA: Diagnosis not present

## 2022-12-30 DIAGNOSIS — I480 Paroxysmal atrial fibrillation: Secondary | ICD-10-CM | POA: Diagnosis not present

## 2022-12-30 DIAGNOSIS — E785 Hyperlipidemia, unspecified: Secondary | ICD-10-CM | POA: Diagnosis not present

## 2022-12-30 NOTE — Patient Instructions (Signed)
Visit Information  Thank you for taking time to visit with me today. Please don't hesitate to contact me if I can be of assistance to you.   Following are the goals we discussed today:   Goals Addressed   None     Our next appointment is by telephone on 01/05/23 at 2:30pm  Please call the care guide team at [phone] if you need to cancel or reschedule your appointment.   If you are experiencing a Mental Health or Behavioral Health Crisis or need someone to talk to, please call 911  Patient verbalizes understanding of instructions and care plan provided today and agrees to view in MyChart. Active MyChart status and patient understanding of how to access instructions and care plan via MyChart confirmed with patient.     Telephone follow up appointment with care management team member scheduled for: 01/05/23 at 2:30pm   Lysle Morales, BSW Social Worker [phone]

## 2022-12-30 NOTE — Patient Outreach (Signed)
Care Coordination   Follow Up Visit Note   12/30/2022 Name: Asiana Elter MRN: [id number] DOB: 23-May-1950  Triva Kristof is a 72 y.o. year old female who sees Bacigalupo, Marzella Schlein, MD for primary care. I spoke with  [name] by phone today.  What matters to the patients health and wellness today?  Patient is waiting on her son to assist with getting her back in bed as she is she is slipping.  Request to reschedule to address mh.    Goals Addressed   None     SDOH assessments and interventions completed:  No     Care Coordination Interventions:  Yes, provided   Interventions Today    Flowsheet Row Most Recent Value  General Interventions   General Interventions Discussed/Reviewed General Interventions Discussed, General Interventions Reviewed  [Pt reports she is struggling with siting on the side of the bed and her feet are slipping on the floor.Pt son is on the way and lives very close.SW does suggest to put something on the floor under her feet to avoid slipping while sitting on the bed]        Follow up plan: Follow up call scheduled for 01/05/23 at 2:30pm.    Encounter Outcome:  Patient Visit Completed

## 2022-12-31 DIAGNOSIS — G709 Myoneural disorder, unspecified: Secondary | ICD-10-CM | POA: Diagnosis not present

## 2022-12-31 DIAGNOSIS — E1159 Type 2 diabetes mellitus with other circulatory complications: Secondary | ICD-10-CM | POA: Diagnosis not present

## 2022-12-31 DIAGNOSIS — I69354 Hemiplegia and hemiparesis following cerebral infarction affecting left non-dominant side: Secondary | ICD-10-CM | POA: Diagnosis not present

## 2022-12-31 DIAGNOSIS — Z85828 Personal history of other malignant neoplasm of skin: Secondary | ICD-10-CM | POA: Diagnosis not present

## 2022-12-31 DIAGNOSIS — H3530 Unspecified macular degeneration: Secondary | ICD-10-CM | POA: Diagnosis not present

## 2022-12-31 DIAGNOSIS — F32A Depression, unspecified: Secondary | ICD-10-CM | POA: Diagnosis not present

## 2022-12-31 DIAGNOSIS — I480 Paroxysmal atrial fibrillation: Secondary | ICD-10-CM | POA: Diagnosis not present

## 2022-12-31 DIAGNOSIS — E785 Hyperlipidemia, unspecified: Secondary | ICD-10-CM | POA: Diagnosis not present

## 2022-12-31 DIAGNOSIS — M1990 Unspecified osteoarthritis, unspecified site: Secondary | ICD-10-CM | POA: Diagnosis not present

## 2022-12-31 DIAGNOSIS — H2512 Age-related nuclear cataract, left eye: Secondary | ICD-10-CM | POA: Diagnosis not present

## 2022-12-31 DIAGNOSIS — I890 Lymphedema, not elsewhere classified: Secondary | ICD-10-CM | POA: Diagnosis not present

## 2022-12-31 DIAGNOSIS — H26491 Other secondary cataract, right eye: Secondary | ICD-10-CM | POA: Diagnosis not present

## 2022-12-31 DIAGNOSIS — N1831 Chronic kidney disease, stage 3a: Secondary | ICD-10-CM | POA: Diagnosis not present

## 2022-12-31 DIAGNOSIS — E890 Postprocedural hypothyroidism: Secondary | ICD-10-CM | POA: Diagnosis not present

## 2022-12-31 DIAGNOSIS — Z7984 Long term (current) use of oral hypoglycemic drugs: Secondary | ICD-10-CM | POA: Diagnosis not present

## 2022-12-31 DIAGNOSIS — I152 Hypertension secondary to endocrine disorders: Secondary | ICD-10-CM | POA: Diagnosis not present

## 2022-12-31 DIAGNOSIS — F419 Anxiety disorder, unspecified: Secondary | ICD-10-CM | POA: Diagnosis not present

## 2022-12-31 DIAGNOSIS — Z7901 Long term (current) use of anticoagulants: Secondary | ICD-10-CM | POA: Diagnosis not present

## 2022-12-31 DIAGNOSIS — E1122 Type 2 diabetes mellitus with diabetic chronic kidney disease: Secondary | ICD-10-CM | POA: Diagnosis not present

## 2022-12-31 DIAGNOSIS — H538 Other visual disturbances: Secondary | ICD-10-CM | POA: Diagnosis not present

## 2022-12-31 DIAGNOSIS — Z6841 Body Mass Index (BMI) 40.0 and over, adult: Secondary | ICD-10-CM | POA: Diagnosis not present

## 2022-12-31 DIAGNOSIS — H35433 Paving stone degeneration of retina, bilateral: Secondary | ICD-10-CM | POA: Diagnosis not present

## 2022-12-31 DIAGNOSIS — I679 Cerebrovascular disease, unspecified: Secondary | ICD-10-CM | POA: Diagnosis not present

## 2022-12-31 DIAGNOSIS — E876 Hypokalemia: Secondary | ICD-10-CM | POA: Diagnosis not present

## 2022-12-31 DIAGNOSIS — N179 Acute kidney failure, unspecified: Secondary | ICD-10-CM | POA: Diagnosis not present

## 2022-12-31 DIAGNOSIS — M810 Age-related osteoporosis without current pathological fracture: Secondary | ICD-10-CM | POA: Diagnosis not present

## 2023-01-02 ENCOUNTER — Encounter: Payer: Self-pay | Admitting: Anesthesiology

## 2023-01-02 DIAGNOSIS — I69354 Hemiplegia and hemiparesis following cerebral infarction affecting left non-dominant side: Secondary | ICD-10-CM | POA: Diagnosis not present

## 2023-01-02 DIAGNOSIS — N1831 Chronic kidney disease, stage 3a: Secondary | ICD-10-CM | POA: Diagnosis not present

## 2023-01-02 DIAGNOSIS — E1122 Type 2 diabetes mellitus with diabetic chronic kidney disease: Secondary | ICD-10-CM | POA: Diagnosis not present

## 2023-01-02 DIAGNOSIS — I890 Lymphedema, not elsewhere classified: Secondary | ICD-10-CM | POA: Diagnosis not present

## 2023-01-02 DIAGNOSIS — F419 Anxiety disorder, unspecified: Secondary | ICD-10-CM | POA: Diagnosis not present

## 2023-01-02 DIAGNOSIS — E1159 Type 2 diabetes mellitus with other circulatory complications: Secondary | ICD-10-CM | POA: Diagnosis not present

## 2023-01-02 DIAGNOSIS — N179 Acute kidney failure, unspecified: Secondary | ICD-10-CM | POA: Diagnosis not present

## 2023-01-02 DIAGNOSIS — I480 Paroxysmal atrial fibrillation: Secondary | ICD-10-CM | POA: Diagnosis not present

## 2023-01-02 DIAGNOSIS — M810 Age-related osteoporosis without current pathological fracture: Secondary | ICD-10-CM | POA: Diagnosis not present

## 2023-01-02 DIAGNOSIS — E876 Hypokalemia: Secondary | ICD-10-CM | POA: Diagnosis not present

## 2023-01-02 DIAGNOSIS — I152 Hypertension secondary to endocrine disorders: Secondary | ICD-10-CM | POA: Diagnosis not present

## 2023-01-02 DIAGNOSIS — F32A Depression, unspecified: Secondary | ICD-10-CM | POA: Diagnosis not present

## 2023-01-02 DIAGNOSIS — G709 Myoneural disorder, unspecified: Secondary | ICD-10-CM | POA: Diagnosis not present

## 2023-01-02 DIAGNOSIS — E890 Postprocedural hypothyroidism: Secondary | ICD-10-CM | POA: Diagnosis not present

## 2023-01-02 DIAGNOSIS — E785 Hyperlipidemia, unspecified: Secondary | ICD-10-CM | POA: Diagnosis not present

## 2023-01-02 DIAGNOSIS — M1990 Unspecified osteoarthritis, unspecified site: Secondary | ICD-10-CM | POA: Diagnosis not present

## 2023-01-05 ENCOUNTER — Ambulatory Visit: Payer: Self-pay

## 2023-01-05 DIAGNOSIS — E890 Postprocedural hypothyroidism: Secondary | ICD-10-CM | POA: Diagnosis not present

## 2023-01-05 DIAGNOSIS — F32A Depression, unspecified: Secondary | ICD-10-CM | POA: Diagnosis not present

## 2023-01-05 DIAGNOSIS — E1122 Type 2 diabetes mellitus with diabetic chronic kidney disease: Secondary | ICD-10-CM | POA: Diagnosis not present

## 2023-01-05 DIAGNOSIS — E1159 Type 2 diabetes mellitus with other circulatory complications: Secondary | ICD-10-CM | POA: Diagnosis not present

## 2023-01-05 DIAGNOSIS — F419 Anxiety disorder, unspecified: Secondary | ICD-10-CM | POA: Diagnosis not present

## 2023-01-05 DIAGNOSIS — M810 Age-related osteoporosis without current pathological fracture: Secondary | ICD-10-CM | POA: Diagnosis not present

## 2023-01-05 DIAGNOSIS — I152 Hypertension secondary to endocrine disorders: Secondary | ICD-10-CM | POA: Diagnosis not present

## 2023-01-05 DIAGNOSIS — N179 Acute kidney failure, unspecified: Secondary | ICD-10-CM | POA: Diagnosis not present

## 2023-01-05 DIAGNOSIS — E876 Hypokalemia: Secondary | ICD-10-CM | POA: Diagnosis not present

## 2023-01-05 DIAGNOSIS — I480 Paroxysmal atrial fibrillation: Secondary | ICD-10-CM | POA: Diagnosis not present

## 2023-01-05 DIAGNOSIS — G709 Myoneural disorder, unspecified: Secondary | ICD-10-CM | POA: Diagnosis not present

## 2023-01-05 DIAGNOSIS — M1990 Unspecified osteoarthritis, unspecified site: Secondary | ICD-10-CM | POA: Diagnosis not present

## 2023-01-05 DIAGNOSIS — I69354 Hemiplegia and hemiparesis following cerebral infarction affecting left non-dominant side: Secondary | ICD-10-CM | POA: Diagnosis not present

## 2023-01-05 DIAGNOSIS — N1831 Chronic kidney disease, stage 3a: Secondary | ICD-10-CM | POA: Diagnosis not present

## 2023-01-05 DIAGNOSIS — E785 Hyperlipidemia, unspecified: Secondary | ICD-10-CM | POA: Diagnosis not present

## 2023-01-05 DIAGNOSIS — I890 Lymphedema, not elsewhere classified: Secondary | ICD-10-CM | POA: Diagnosis not present

## 2023-01-05 NOTE — Patient Outreach (Signed)
Care Coordination   Follow Up Visit Note   01/05/2023 Name: Mattilynn Whitcomb MRN: [id number] DOB: Aug 26, 1950  Solie Hootman is a 72 y.o. year old female who sees Bacigalupo, Marzella Schlein, MD for primary care. I spoke with  [name] by phone today.  What matters to the patients health and wellness today?  Patient needs mental health services.    Goals Addressed             This Visit's Progress    Care Coordination Activities       Interventions Today    Flowsheet Row Most Recent Value  General Interventions   General Interventions Discussed/Reviewed General Interventions Discussed, General Interventions Reviewed  Mental Health Interventions   Mental Health Discussed/Reviewed Other  [Pt has been placed on a waiting list for Lindaann Slough at [facility], but pt will call back on Friday to scheduled with the next available if there is not an opening with Misty Stanley. Pt was referred to a therapist but she is out of the state.]              SDOH assessments and interventions completed:  No     Care Coordination Interventions:  Yes, provided   Follow up plan: Follow up call scheduled for 01/14/23 at 11:30am    Encounter Outcome:  Patient Visit Completed

## 2023-01-05 NOTE — Patient Instructions (Signed)
Visit Information  Thank you for taking time to visit with me today. Please don't hesitate to contact me if I can be of assistance to you.   Following are the goals we discussed today:   Patient plans to contact [facility] by 10/18 to follow up on apt and also discuss other local options for therapy.   Our next appointment is by telephone on 01/14/23 at 11:30am  Please call the care guide team at [phone] if you need to cancel or reschedule your appointment.   If you are experiencing a Mental Health or Behavioral Health Crisis or need someone to talk to, please call 911  Patient verbalizes understanding of instructions and care plan provided today and agrees to view in MyChart. Active MyChart status and patient understanding of how to access instructions and care plan via MyChart confirmed with patient.     Telephone follow up appointment with care management team member scheduled for: 01/14/23 11:30am   Lysle Morales, BSW Social Worker [phone]

## 2023-01-06 ENCOUNTER — Ambulatory Visit (INDEPENDENT_AMBULATORY_CARE_PROVIDER_SITE_OTHER): Payer: Medicare Other | Admitting: Family Medicine

## 2023-01-06 ENCOUNTER — Encounter: Payer: Self-pay | Admitting: Family Medicine

## 2023-01-06 VITALS — BP 105/65 | HR 57 | Temp 97.7°F | Ht 64.0 in

## 2023-01-06 DIAGNOSIS — I69354 Hemiplegia and hemiparesis following cerebral infarction affecting left non-dominant side: Secondary | ICD-10-CM | POA: Diagnosis not present

## 2023-01-06 DIAGNOSIS — G709 Myoneural disorder, unspecified: Secondary | ICD-10-CM | POA: Diagnosis not present

## 2023-01-06 DIAGNOSIS — Z711 Person with feared health complaint in whom no diagnosis is made: Secondary | ICD-10-CM

## 2023-01-06 DIAGNOSIS — I152 Hypertension secondary to endocrine disorders: Secondary | ICD-10-CM | POA: Diagnosis not present

## 2023-01-06 DIAGNOSIS — I890 Lymphedema, not elsewhere classified: Secondary | ICD-10-CM | POA: Diagnosis not present

## 2023-01-06 DIAGNOSIS — E1159 Type 2 diabetes mellitus with other circulatory complications: Secondary | ICD-10-CM | POA: Diagnosis not present

## 2023-01-06 DIAGNOSIS — N1831 Chronic kidney disease, stage 3a: Secondary | ICD-10-CM | POA: Diagnosis not present

## 2023-01-06 DIAGNOSIS — E1122 Type 2 diabetes mellitus with diabetic chronic kidney disease: Secondary | ICD-10-CM | POA: Diagnosis not present

## 2023-01-06 DIAGNOSIS — F32A Depression, unspecified: Secondary | ICD-10-CM | POA: Diagnosis not present

## 2023-01-06 DIAGNOSIS — M1712 Unilateral primary osteoarthritis, left knee: Secondary | ICD-10-CM | POA: Diagnosis not present

## 2023-01-06 DIAGNOSIS — E876 Hypokalemia: Secondary | ICD-10-CM | POA: Diagnosis not present

## 2023-01-06 DIAGNOSIS — E785 Hyperlipidemia, unspecified: Secondary | ICD-10-CM | POA: Diagnosis not present

## 2023-01-06 DIAGNOSIS — F411 Generalized anxiety disorder: Secondary | ICD-10-CM

## 2023-01-06 DIAGNOSIS — N179 Acute kidney failure, unspecified: Secondary | ICD-10-CM | POA: Diagnosis not present

## 2023-01-06 DIAGNOSIS — M1711 Unilateral primary osteoarthritis, right knee: Secondary | ICD-10-CM | POA: Diagnosis not present

## 2023-01-06 DIAGNOSIS — M1990 Unspecified osteoarthritis, unspecified site: Secondary | ICD-10-CM | POA: Diagnosis not present

## 2023-01-06 DIAGNOSIS — M810 Age-related osteoporosis without current pathological fracture: Secondary | ICD-10-CM | POA: Diagnosis not present

## 2023-01-06 DIAGNOSIS — Z8673 Personal history of transient ischemic attack (TIA), and cerebral infarction without residual deficits: Secondary | ICD-10-CM

## 2023-01-06 DIAGNOSIS — E890 Postprocedural hypothyroidism: Secondary | ICD-10-CM | POA: Diagnosis not present

## 2023-01-06 DIAGNOSIS — F419 Anxiety disorder, unspecified: Secondary | ICD-10-CM | POA: Diagnosis not present

## 2023-01-06 DIAGNOSIS — I480 Paroxysmal atrial fibrillation: Secondary | ICD-10-CM | POA: Diagnosis not present

## 2023-01-06 DIAGNOSIS — M170 Bilateral primary osteoarthritis of knee: Secondary | ICD-10-CM | POA: Diagnosis not present

## 2023-01-06 MED ORDER — APIXABAN 5 MG PO TABS: 5.0000 mg | ORAL_TABLET | Freq: Two times a day (BID) | ORAL | 2 refills | Status: DC

## 2023-01-06 MED ORDER — ALPRAZOLAM 0.5 MG PO TABS: ORAL_TABLET | ORAL | 1 refills | Status: DC

## 2023-01-06 NOTE — Progress Notes (Signed)
Established Patient Office Visit  Subjective   Patient ID: Christina Rubio, female    DOB: 03-03-1951  Age: 72 y.o. MRN: [id number]  Chief Complaint  Patient presents with   Acute Visit    Pt has sore on back, following up with past stroke.     HPI  Discussed the use of AI scribe software for clinical note transcription with the patient, who gave verbal consent to proceed.  History of Present Illness   The patient, with a history of stroke, presents with a concern about a sore on her back, which she initially feared might be a boil. The patient noticed the sore after sleeping on a rough towel and experienced discomfort throughout the day. She applied witch hazel to the area as a home remedy. Upon examination, the sore was not visible, and the patient did not feel it after her shower, suggesting it may have resolved.  The patient also mentions a history of stroke, which occurred on Memorial Day. She has been managing post-stroke symptoms and has an upcoming appointment with a neurologist, Dr. Malvin Johns. The patient had to cancel her previous appointment due to a severe panic attack. She has been trying to get an appointment with a psychiatrist, Clayton Lefort, in [place]. The patient requests a refill of her Xanax prescription to manage her anxiety.  Additionally, the patient mentions a skin condition on her back, which she believes should be evaluated by a dermatologist. She had a similar condition in the past, which was treated by a dermatologist. The patient is also on Eliquis and requests a refill of this medication.         ROS    Objective:     BP 105/65   Pulse (!) 57   Temp 97.7 F (36.5 C) (Oral)   Ht 5\' 4"  (1.626 m)   SpO2 91%   BMI 60.62 kg/m    Physical Exam  Physical Exam   SKIN: No visible sore or lesion present on the back.        No results found for any visits on 01/06/23.    The ASCVD Risk score (Arnett DK, et al., 2019) failed to calculate for the  following reasons:   The patient has a prior MI or stroke diagnosis    Assessment & Plan:   Problem List Items Addressed This Visit       Other   GAD (generalized anxiety disorder)   Relevant Medications   ALPRAZolam (XANAX) 0.5 MG tablet   History of CVA (cerebrovascular accident)   Other Visit Diagnoses     Concern about skin disease without diagnosis    -  Primary           Skin Lesion Patient reported a sore on her back that was painful. On examination, no lesion was found. Patient also reported a recurrent lesion on her face. -Recommended patient to consider dermatology consultation for the facial lesion.  Fall Risk Patient reported multiple falls this year, but usually able to get to a seated position. -Encouraged patient to continue using mobility aids and to ensure safety at home.  Medication Refills Patient requested refills for Eliquis and Xanax. -Refill Eliquis and Xanax as requested.  Mental Health Patient reported a recent stroke and memory issues, as well as a recent panic attack. She is trying to get an appointment with a psychiatrist. -Encourage patient to continue efforts to see a psychiatrist. Monitor mental health status in future visits.  Return for as scheduled.    Shirlee Latch, MD

## 2023-01-07 ENCOUNTER — Telehealth: Payer: Self-pay | Admitting: Family Medicine

## 2023-01-07 DIAGNOSIS — M159 Polyosteoarthritis, unspecified: Secondary | ICD-10-CM

## 2023-01-07 DIAGNOSIS — E1169 Type 2 diabetes mellitus with other specified complication: Secondary | ICD-10-CM

## 2023-01-07 DIAGNOSIS — Z9181 History of falling: Secondary | ICD-10-CM

## 2023-01-07 DIAGNOSIS — Z789 Other specified health status: Secondary | ICD-10-CM

## 2023-01-07 MED ORDER — LANCETS MISC. MISC
1.0000 | Freq: Three times a day (TID) | 0 refills | Status: AC
Start: 2023-01-07 — End: 2023-02-06

## 2023-01-07 MED ORDER — LANCET DEVICE MISC
1.0000 | Freq: Three times a day (TID) | 0 refills | Status: AC
Start: 2023-01-07 — End: 2023-02-06

## 2023-01-07 MED ORDER — BLOOD GLUCOSE MONITORING SUPPL DEVI
1.0000 | Freq: Three times a day (TID) | 0 refills | Status: DC
Start: 2023-01-07 — End: 2024-02-12

## 2023-01-07 MED ORDER — BLOOD GLUCOSE TEST VI STRP
1.0000 | ORAL_STRIP | Freq: Three times a day (TID) | 0 refills | Status: AC
Start: 2023-01-07 — End: 2023-02-06

## 2023-01-07 NOTE — Telephone Encounter (Signed)
send

## 2023-01-07 NOTE — Telephone Encounter (Signed)
The patient called in stating she needs a blood prick monitor but is requesting one that is covered by her Medicare insurance. She has already spoke with someone at [facility] and they are aware and just waiting for the prescription from the provider. Please assist patient further.   [facility] - [place], [place] - [address] Phone: [phone]  Fax: [phone]     She says she has 2 other prescriptions that the pharmacy is holding for her to pick up so if there is anyway it can be done as soon as possible so she only has to make one trip it would be very much appreciated.

## 2023-01-07 NOTE — Telephone Encounter (Signed)
Patient called stated she fell and now she is scared when getting in and out of the shower. She is requesting provider help her get home health services to come in and help with getting bathed and dressed. Please f/u with patient.

## 2023-01-08 NOTE — Addendum Note (Signed)
Addended by: Erasmo Downer on: 01/08/2023 12:46 PM   Modules accepted: Orders

## 2023-01-12 ENCOUNTER — Telehealth (INDEPENDENT_AMBULATORY_CARE_PROVIDER_SITE_OTHER): Payer: Medicare Other | Admitting: Family Medicine

## 2023-01-12 ENCOUNTER — Encounter: Payer: Self-pay | Admitting: Family Medicine

## 2023-01-12 VITALS — Ht 64.0 in | Wt 353.0 lb

## 2023-01-12 DIAGNOSIS — R32 Unspecified urinary incontinence: Secondary | ICD-10-CM | POA: Diagnosis not present

## 2023-01-12 DIAGNOSIS — N3281 Overactive bladder: Secondary | ICD-10-CM | POA: Diagnosis not present

## 2023-01-12 MED ORDER — OXYBUTYNIN CHLORIDE ER 5 MG PO TB24: 5.0000 mg | ORAL_TABLET | Freq: Every day | ORAL | 2 refills | Status: DC

## 2023-01-12 NOTE — Progress Notes (Signed)
MyChart Video Visit    Virtual Visit via Video Note   This format is felt to be most appropriate for this patient at this time. Physical exam was limited by quality of the video and audio technology used for the visit.    Patient location: home Provider location: [facility] Persons involved in the visit: patient, provider  I discussed the limitations of evaluation and management by telemedicine and the availability of in person appointments. The patient expressed understanding and agreed to proceed.  Patient: Christina Rubio   DOB: 1950/07/04   72 y.o. Female  MRN: [id number] Visit Date: 01/12/2023  Today's healthcare provider: Shirlee Latch, MD   No chief complaint on file.  Subjective    HPI   Discussed the use of AI scribe software for clinical note transcription with the patient, who gave verbal consent to proceed.  History of Present Illness   The patient, with a history of stroke and recent left knee injury, presents with urinary incontinence. She reports that the incontinence occurs when she stands up due to knee pain, and she is unable to make it to the bathroom in time. The incontinence is not consistent, but it occurs frequently enough to be a concern. The patient also reports a sharp pain in her left knee, which has caused her to fall in the past. She has seen an orthopedist for the knee pain and received a cortisone shot, which initially helped, but the pain returned the following day. The patient is currently using a wheelchair for mobility to prevent further falls. She has been prescribed tramadol for the knee pain and is also taking Eliquis and Xanax.       Review of Systems      Objective    Ht 5\' 4"  (1.626 m)   Wt (!) 353 lb (160.1 kg)   BMI 60.59 kg/m       Physical Exam Constitutional:      General: She is not in acute distress. Pulmonary:     Effort: Pulmonary effort is normal. No respiratory distress.  Neurological:      Mental Status: She is alert. Mental status is at baseline.        Assessment & Plan     Problem List Items Addressed This Visit       Genitourinary   OAB (overactive bladder) - Primary        Urinary Incontinence, OAB Reports of urgency and incontinence, particularly when standing due to knee pain. Discussed the use of Oxybutynin to manage bladder spasms. -Start Oxybutynin 5mg  at bedtime to reduce bladder spasms. -Advise patient to monitor for dizziness or drowsiness as potential side effects.  Knee Pain Reports of severe knee pain causing falls and impacting mobility. Currently under the care of orthopedics and due to be fitted for a knee brace. -Continue orthopedic follow-up and brace fitting.  Pain Management Reports of pain due to knee issue. Tramadol prescribed by orthopedics. Discussed potential interaction with Xanax. -Advise to use caution when taking Tramadol and Xanax on the same day. -Continue Tramadol as prescribed by orthopedics for knee pain.  General Health Maintenance -Continue Eliquis as prescribed. -Continue Xanax as needed for stress management.          Return if symptoms worsen or fail to improve.     I discussed the assessment and treatment plan with the patient. The patient was provided an opportunity to ask questions and all were answered. The patient agreed with the plan and  demonstrated an understanding of the instructions.   The patient was advised to call back or seek an in-person evaluation if the symptoms worsen or if the condition fails to improve as anticipated.   Shirlee Latch, MD [facility] [phone] (phone) [phone] (fax)  [facility]

## 2023-01-13 DIAGNOSIS — G709 Myoneural disorder, unspecified: Secondary | ICD-10-CM | POA: Diagnosis not present

## 2023-01-13 DIAGNOSIS — I890 Lymphedema, not elsewhere classified: Secondary | ICD-10-CM | POA: Diagnosis not present

## 2023-01-13 DIAGNOSIS — M810 Age-related osteoporosis without current pathological fracture: Secondary | ICD-10-CM | POA: Diagnosis not present

## 2023-01-13 DIAGNOSIS — E1159 Type 2 diabetes mellitus with other circulatory complications: Secondary | ICD-10-CM | POA: Diagnosis not present

## 2023-01-13 DIAGNOSIS — I152 Hypertension secondary to endocrine disorders: Secondary | ICD-10-CM | POA: Diagnosis not present

## 2023-01-13 DIAGNOSIS — I69354 Hemiplegia and hemiparesis following cerebral infarction affecting left non-dominant side: Secondary | ICD-10-CM | POA: Diagnosis not present

## 2023-01-13 DIAGNOSIS — E890 Postprocedural hypothyroidism: Secondary | ICD-10-CM | POA: Diagnosis not present

## 2023-01-13 DIAGNOSIS — I480 Paroxysmal atrial fibrillation: Secondary | ICD-10-CM | POA: Diagnosis not present

## 2023-01-13 DIAGNOSIS — N1831 Chronic kidney disease, stage 3a: Secondary | ICD-10-CM | POA: Diagnosis not present

## 2023-01-13 DIAGNOSIS — F419 Anxiety disorder, unspecified: Secondary | ICD-10-CM | POA: Diagnosis not present

## 2023-01-13 DIAGNOSIS — M1990 Unspecified osteoarthritis, unspecified site: Secondary | ICD-10-CM | POA: Diagnosis not present

## 2023-01-13 DIAGNOSIS — E785 Hyperlipidemia, unspecified: Secondary | ICD-10-CM | POA: Diagnosis not present

## 2023-01-13 DIAGNOSIS — E876 Hypokalemia: Secondary | ICD-10-CM | POA: Diagnosis not present

## 2023-01-13 DIAGNOSIS — N179 Acute kidney failure, unspecified: Secondary | ICD-10-CM | POA: Diagnosis not present

## 2023-01-13 DIAGNOSIS — F32A Depression, unspecified: Secondary | ICD-10-CM | POA: Diagnosis not present

## 2023-01-13 DIAGNOSIS — E1122 Type 2 diabetes mellitus with diabetic chronic kidney disease: Secondary | ICD-10-CM | POA: Diagnosis not present

## 2023-01-14 ENCOUNTER — Ambulatory Visit: Payer: Self-pay

## 2023-01-14 NOTE — Patient Outreach (Signed)
Care Coordination   01/14/2023 Name: Christina Rubio MRN: [id number] DOB: 07/10/1950   Care Coordination Outreach Attempts:  An unsuccessful telephone outreach was attempted for a scheduled appointment today.  Follow Up Plan:  Additional outreach attempts will be made to offer the patient care coordination information and services.   Encounter Outcome:  No Answer   Care Coordination Interventions:  No, not indicated    Lysle Morales, BSW Social Worker [phone]

## 2023-01-15 DIAGNOSIS — E876 Hypokalemia: Secondary | ICD-10-CM | POA: Diagnosis not present

## 2023-01-15 DIAGNOSIS — M810 Age-related osteoporosis without current pathological fracture: Secondary | ICD-10-CM | POA: Diagnosis not present

## 2023-01-15 DIAGNOSIS — M1990 Unspecified osteoarthritis, unspecified site: Secondary | ICD-10-CM | POA: Diagnosis not present

## 2023-01-15 DIAGNOSIS — E890 Postprocedural hypothyroidism: Secondary | ICD-10-CM | POA: Diagnosis not present

## 2023-01-15 DIAGNOSIS — F419 Anxiety disorder, unspecified: Secondary | ICD-10-CM | POA: Diagnosis not present

## 2023-01-15 DIAGNOSIS — E1159 Type 2 diabetes mellitus with other circulatory complications: Secondary | ICD-10-CM | POA: Diagnosis not present

## 2023-01-15 DIAGNOSIS — I890 Lymphedema, not elsewhere classified: Secondary | ICD-10-CM | POA: Diagnosis not present

## 2023-01-15 DIAGNOSIS — I69354 Hemiplegia and hemiparesis following cerebral infarction affecting left non-dominant side: Secondary | ICD-10-CM | POA: Diagnosis not present

## 2023-01-15 DIAGNOSIS — F32A Depression, unspecified: Secondary | ICD-10-CM | POA: Diagnosis not present

## 2023-01-15 DIAGNOSIS — E1122 Type 2 diabetes mellitus with diabetic chronic kidney disease: Secondary | ICD-10-CM | POA: Diagnosis not present

## 2023-01-15 DIAGNOSIS — I152 Hypertension secondary to endocrine disorders: Secondary | ICD-10-CM | POA: Diagnosis not present

## 2023-01-15 DIAGNOSIS — N1831 Chronic kidney disease, stage 3a: Secondary | ICD-10-CM | POA: Diagnosis not present

## 2023-01-15 DIAGNOSIS — E785 Hyperlipidemia, unspecified: Secondary | ICD-10-CM | POA: Diagnosis not present

## 2023-01-15 DIAGNOSIS — N179 Acute kidney failure, unspecified: Secondary | ICD-10-CM | POA: Diagnosis not present

## 2023-01-15 DIAGNOSIS — G709 Myoneural disorder, unspecified: Secondary | ICD-10-CM | POA: Diagnosis not present

## 2023-01-15 DIAGNOSIS — I480 Paroxysmal atrial fibrillation: Secondary | ICD-10-CM | POA: Diagnosis not present

## 2023-01-22 DIAGNOSIS — E1122 Type 2 diabetes mellitus with diabetic chronic kidney disease: Secondary | ICD-10-CM | POA: Diagnosis not present

## 2023-01-22 DIAGNOSIS — G709 Myoneural disorder, unspecified: Secondary | ICD-10-CM | POA: Diagnosis not present

## 2023-01-22 DIAGNOSIS — E890 Postprocedural hypothyroidism: Secondary | ICD-10-CM | POA: Diagnosis not present

## 2023-01-22 DIAGNOSIS — E785 Hyperlipidemia, unspecified: Secondary | ICD-10-CM | POA: Diagnosis not present

## 2023-01-22 DIAGNOSIS — I69354 Hemiplegia and hemiparesis following cerebral infarction affecting left non-dominant side: Secondary | ICD-10-CM | POA: Diagnosis not present

## 2023-01-22 DIAGNOSIS — F32A Depression, unspecified: Secondary | ICD-10-CM | POA: Diagnosis not present

## 2023-01-22 DIAGNOSIS — E1159 Type 2 diabetes mellitus with other circulatory complications: Secondary | ICD-10-CM | POA: Diagnosis not present

## 2023-01-22 DIAGNOSIS — M810 Age-related osteoporosis without current pathological fracture: Secondary | ICD-10-CM | POA: Diagnosis not present

## 2023-01-22 DIAGNOSIS — M1990 Unspecified osteoarthritis, unspecified site: Secondary | ICD-10-CM | POA: Diagnosis not present

## 2023-01-22 DIAGNOSIS — F419 Anxiety disorder, unspecified: Secondary | ICD-10-CM | POA: Diagnosis not present

## 2023-01-22 DIAGNOSIS — I890 Lymphedema, not elsewhere classified: Secondary | ICD-10-CM | POA: Diagnosis not present

## 2023-01-22 DIAGNOSIS — N179 Acute kidney failure, unspecified: Secondary | ICD-10-CM | POA: Diagnosis not present

## 2023-01-22 DIAGNOSIS — E876 Hypokalemia: Secondary | ICD-10-CM | POA: Diagnosis not present

## 2023-01-22 DIAGNOSIS — I480 Paroxysmal atrial fibrillation: Secondary | ICD-10-CM | POA: Diagnosis not present

## 2023-01-22 DIAGNOSIS — I152 Hypertension secondary to endocrine disorders: Secondary | ICD-10-CM | POA: Diagnosis not present

## 2023-01-22 DIAGNOSIS — N1831 Chronic kidney disease, stage 3a: Secondary | ICD-10-CM | POA: Diagnosis not present

## 2023-01-23 ENCOUNTER — Ambulatory Visit: Payer: Medicare Other

## 2023-01-23 DIAGNOSIS — I480 Paroxysmal atrial fibrillation: Secondary | ICD-10-CM | POA: Diagnosis not present

## 2023-01-23 DIAGNOSIS — I890 Lymphedema, not elsewhere classified: Secondary | ICD-10-CM | POA: Diagnosis not present

## 2023-01-23 DIAGNOSIS — E1122 Type 2 diabetes mellitus with diabetic chronic kidney disease: Secondary | ICD-10-CM | POA: Diagnosis not present

## 2023-01-23 DIAGNOSIS — E876 Hypokalemia: Secondary | ICD-10-CM | POA: Diagnosis not present

## 2023-01-23 DIAGNOSIS — M810 Age-related osteoporosis without current pathological fracture: Secondary | ICD-10-CM | POA: Diagnosis not present

## 2023-01-23 DIAGNOSIS — M1990 Unspecified osteoarthritis, unspecified site: Secondary | ICD-10-CM | POA: Diagnosis not present

## 2023-01-23 DIAGNOSIS — I69354 Hemiplegia and hemiparesis following cerebral infarction affecting left non-dominant side: Secondary | ICD-10-CM | POA: Diagnosis not present

## 2023-01-23 DIAGNOSIS — E1159 Type 2 diabetes mellitus with other circulatory complications: Secondary | ICD-10-CM | POA: Diagnosis not present

## 2023-01-23 DIAGNOSIS — F32A Depression, unspecified: Secondary | ICD-10-CM | POA: Diagnosis not present

## 2023-01-23 DIAGNOSIS — E890 Postprocedural hypothyroidism: Secondary | ICD-10-CM | POA: Diagnosis not present

## 2023-01-23 DIAGNOSIS — N1831 Chronic kidney disease, stage 3a: Secondary | ICD-10-CM | POA: Diagnosis not present

## 2023-01-23 DIAGNOSIS — E785 Hyperlipidemia, unspecified: Secondary | ICD-10-CM | POA: Diagnosis not present

## 2023-01-23 DIAGNOSIS — G709 Myoneural disorder, unspecified: Secondary | ICD-10-CM | POA: Diagnosis not present

## 2023-01-23 DIAGNOSIS — F419 Anxiety disorder, unspecified: Secondary | ICD-10-CM | POA: Diagnosis not present

## 2023-01-23 DIAGNOSIS — I152 Hypertension secondary to endocrine disorders: Secondary | ICD-10-CM | POA: Diagnosis not present

## 2023-01-23 DIAGNOSIS — N179 Acute kidney failure, unspecified: Secondary | ICD-10-CM | POA: Diagnosis not present

## 2023-01-27 DIAGNOSIS — E785 Hyperlipidemia, unspecified: Secondary | ICD-10-CM | POA: Diagnosis not present

## 2023-01-27 DIAGNOSIS — M810 Age-related osteoporosis without current pathological fracture: Secondary | ICD-10-CM | POA: Diagnosis not present

## 2023-01-27 DIAGNOSIS — E876 Hypokalemia: Secondary | ICD-10-CM | POA: Diagnosis not present

## 2023-01-27 DIAGNOSIS — I69354 Hemiplegia and hemiparesis following cerebral infarction affecting left non-dominant side: Secondary | ICD-10-CM | POA: Diagnosis not present

## 2023-01-27 DIAGNOSIS — N1831 Chronic kidney disease, stage 3a: Secondary | ICD-10-CM | POA: Diagnosis not present

## 2023-01-27 DIAGNOSIS — G709 Myoneural disorder, unspecified: Secondary | ICD-10-CM | POA: Diagnosis not present

## 2023-01-27 DIAGNOSIS — M1990 Unspecified osteoarthritis, unspecified site: Secondary | ICD-10-CM | POA: Diagnosis not present

## 2023-01-27 DIAGNOSIS — E1122 Type 2 diabetes mellitus with diabetic chronic kidney disease: Secondary | ICD-10-CM | POA: Diagnosis not present

## 2023-01-27 DIAGNOSIS — E1159 Type 2 diabetes mellitus with other circulatory complications: Secondary | ICD-10-CM | POA: Diagnosis not present

## 2023-01-27 DIAGNOSIS — I480 Paroxysmal atrial fibrillation: Secondary | ICD-10-CM | POA: Diagnosis not present

## 2023-01-27 DIAGNOSIS — I890 Lymphedema, not elsewhere classified: Secondary | ICD-10-CM | POA: Diagnosis not present

## 2023-01-27 DIAGNOSIS — N179 Acute kidney failure, unspecified: Secondary | ICD-10-CM | POA: Diagnosis not present

## 2023-01-27 DIAGNOSIS — F32A Depression, unspecified: Secondary | ICD-10-CM | POA: Diagnosis not present

## 2023-01-27 DIAGNOSIS — I152 Hypertension secondary to endocrine disorders: Secondary | ICD-10-CM | POA: Diagnosis not present

## 2023-01-27 DIAGNOSIS — F419 Anxiety disorder, unspecified: Secondary | ICD-10-CM | POA: Diagnosis not present

## 2023-01-27 DIAGNOSIS — E890 Postprocedural hypothyroidism: Secondary | ICD-10-CM | POA: Diagnosis not present

## 2023-01-28 DIAGNOSIS — E1122 Type 2 diabetes mellitus with diabetic chronic kidney disease: Secondary | ICD-10-CM | POA: Diagnosis not present

## 2023-01-28 DIAGNOSIS — M1990 Unspecified osteoarthritis, unspecified site: Secondary | ICD-10-CM | POA: Diagnosis not present

## 2023-01-28 DIAGNOSIS — F32A Depression, unspecified: Secondary | ICD-10-CM | POA: Diagnosis not present

## 2023-01-28 DIAGNOSIS — I890 Lymphedema, not elsewhere classified: Secondary | ICD-10-CM | POA: Diagnosis not present

## 2023-01-28 DIAGNOSIS — I69354 Hemiplegia and hemiparesis following cerebral infarction affecting left non-dominant side: Secondary | ICD-10-CM | POA: Diagnosis not present

## 2023-01-28 DIAGNOSIS — I152 Hypertension secondary to endocrine disorders: Secondary | ICD-10-CM | POA: Diagnosis not present

## 2023-01-28 DIAGNOSIS — E890 Postprocedural hypothyroidism: Secondary | ICD-10-CM | POA: Diagnosis not present

## 2023-01-28 DIAGNOSIS — E1159 Type 2 diabetes mellitus with other circulatory complications: Secondary | ICD-10-CM | POA: Diagnosis not present

## 2023-01-28 DIAGNOSIS — I480 Paroxysmal atrial fibrillation: Secondary | ICD-10-CM | POA: Diagnosis not present

## 2023-01-28 DIAGNOSIS — N179 Acute kidney failure, unspecified: Secondary | ICD-10-CM | POA: Diagnosis not present

## 2023-01-28 DIAGNOSIS — F419 Anxiety disorder, unspecified: Secondary | ICD-10-CM | POA: Diagnosis not present

## 2023-01-28 DIAGNOSIS — G709 Myoneural disorder, unspecified: Secondary | ICD-10-CM | POA: Diagnosis not present

## 2023-01-28 DIAGNOSIS — N1831 Chronic kidney disease, stage 3a: Secondary | ICD-10-CM | POA: Diagnosis not present

## 2023-01-28 DIAGNOSIS — M810 Age-related osteoporosis without current pathological fracture: Secondary | ICD-10-CM | POA: Diagnosis not present

## 2023-01-28 DIAGNOSIS — E876 Hypokalemia: Secondary | ICD-10-CM | POA: Diagnosis not present

## 2023-01-28 DIAGNOSIS — E785 Hyperlipidemia, unspecified: Secondary | ICD-10-CM | POA: Diagnosis not present

## 2023-01-29 DIAGNOSIS — F32A Depression, unspecified: Secondary | ICD-10-CM | POA: Diagnosis not present

## 2023-01-29 DIAGNOSIS — E785 Hyperlipidemia, unspecified: Secondary | ICD-10-CM | POA: Diagnosis not present

## 2023-01-29 DIAGNOSIS — F419 Anxiety disorder, unspecified: Secondary | ICD-10-CM | POA: Diagnosis not present

## 2023-01-29 DIAGNOSIS — I890 Lymphedema, not elsewhere classified: Secondary | ICD-10-CM | POA: Diagnosis not present

## 2023-01-29 DIAGNOSIS — I480 Paroxysmal atrial fibrillation: Secondary | ICD-10-CM | POA: Diagnosis not present

## 2023-01-29 DIAGNOSIS — M1990 Unspecified osteoarthritis, unspecified site: Secondary | ICD-10-CM | POA: Diagnosis not present

## 2023-01-29 DIAGNOSIS — G709 Myoneural disorder, unspecified: Secondary | ICD-10-CM | POA: Diagnosis not present

## 2023-01-29 DIAGNOSIS — I152 Hypertension secondary to endocrine disorders: Secondary | ICD-10-CM | POA: Diagnosis not present

## 2023-01-29 DIAGNOSIS — E1159 Type 2 diabetes mellitus with other circulatory complications: Secondary | ICD-10-CM | POA: Diagnosis not present

## 2023-01-29 DIAGNOSIS — E890 Postprocedural hypothyroidism: Secondary | ICD-10-CM | POA: Diagnosis not present

## 2023-01-29 DIAGNOSIS — N1831 Chronic kidney disease, stage 3a: Secondary | ICD-10-CM | POA: Diagnosis not present

## 2023-01-29 DIAGNOSIS — E876 Hypokalemia: Secondary | ICD-10-CM | POA: Diagnosis not present

## 2023-01-29 DIAGNOSIS — M810 Age-related osteoporosis without current pathological fracture: Secondary | ICD-10-CM | POA: Diagnosis not present

## 2023-01-29 DIAGNOSIS — E1122 Type 2 diabetes mellitus with diabetic chronic kidney disease: Secondary | ICD-10-CM | POA: Diagnosis not present

## 2023-01-29 DIAGNOSIS — N179 Acute kidney failure, unspecified: Secondary | ICD-10-CM | POA: Diagnosis not present

## 2023-01-29 DIAGNOSIS — I69354 Hemiplegia and hemiparesis following cerebral infarction affecting left non-dominant side: Secondary | ICD-10-CM | POA: Diagnosis not present

## 2023-01-30 ENCOUNTER — Telehealth: Payer: Self-pay | Admitting: Family Medicine

## 2023-01-30 NOTE — Telephone Encounter (Unsigned)
Copied from CRM [id number]. Topic: Quick Communication - Home Health Verbal Orders >> Jan 30, 2023 11:50 AM Macon Large wrote: Caller/Agency: Junious Dresser with [facility] Callback Number: [phone] Service Requested: Occupational Therapy Frequency: 1 x 8 Any new concerns about the patient? No

## 2023-02-03 ENCOUNTER — Telehealth: Payer: Self-pay | Admitting: Family Medicine

## 2023-02-03 NOTE — Telephone Encounter (Signed)
OK for verbals

## 2023-02-03 NOTE — Telephone Encounter (Signed)
Verbals given  

## 2023-02-03 NOTE — Telephone Encounter (Signed)
Home Health Verbal Orders - Caller/Agency: Phu with [facility] Number: [phone]  Service Requested: Physical Therapy  Frequency: 1 week 9  Any new concerns about the patient? No

## 2023-02-03 NOTE — Telephone Encounter (Signed)
Verbal given 

## 2023-02-09 ENCOUNTER — Telehealth: Payer: Self-pay

## 2023-02-09 NOTE — Telephone Encounter (Unsigned)
Copied from CRM [id number]. Topic: General - Other >> Feb 09, 2023 10:53 AM Turkey B wrote: Reason for CRM: pt called in says is faxing in paperwork for her to get reimbursed for her home care

## 2023-02-10 NOTE — Telephone Encounter (Signed)
Pt called back to see if she can get an email to send her documents or upload it on my chart

## 2023-02-10 NOTE — Telephone Encounter (Signed)
Contacted patient and she reports that her insurance company actually emailed her the paperwork and she would like to know if she could email it to the office. Advised she would not be able to email it but she can attach the file to a mychart message. Patient verbalized understanding and requested assistance on helping her attach the file. Steps provided to patient to attach file and she should be sending mychart message.

## 2023-02-10 NOTE — Telephone Encounter (Signed)
I do not have any paperwork for her. Has anyone seen this?

## 2023-02-11 DIAGNOSIS — R2990 Unspecified symptoms and signs involving the nervous system: Secondary | ICD-10-CM | POA: Diagnosis not present

## 2023-02-11 DIAGNOSIS — I480 Paroxysmal atrial fibrillation: Secondary | ICD-10-CM | POA: Diagnosis not present

## 2023-02-11 DIAGNOSIS — I639 Cerebral infarction, unspecified: Secondary | ICD-10-CM | POA: Diagnosis not present

## 2023-02-11 DIAGNOSIS — F419 Anxiety disorder, unspecified: Secondary | ICD-10-CM | POA: Diagnosis not present

## 2023-02-26 DIAGNOSIS — G5601 Carpal tunnel syndrome, right upper limb: Secondary | ICD-10-CM | POA: Diagnosis not present

## 2023-03-01 DIAGNOSIS — N1831 Chronic kidney disease, stage 3a: Secondary | ICD-10-CM | POA: Diagnosis not present

## 2023-03-01 DIAGNOSIS — M810 Age-related osteoporosis without current pathological fracture: Secondary | ICD-10-CM | POA: Diagnosis not present

## 2023-03-01 DIAGNOSIS — G709 Myoneural disorder, unspecified: Secondary | ICD-10-CM | POA: Diagnosis not present

## 2023-03-01 DIAGNOSIS — N179 Acute kidney failure, unspecified: Secondary | ICD-10-CM | POA: Diagnosis not present

## 2023-03-01 DIAGNOSIS — E1159 Type 2 diabetes mellitus with other circulatory complications: Secondary | ICD-10-CM | POA: Diagnosis not present

## 2023-03-01 DIAGNOSIS — E1122 Type 2 diabetes mellitus with diabetic chronic kidney disease: Secondary | ICD-10-CM | POA: Diagnosis not present

## 2023-03-01 DIAGNOSIS — M1990 Unspecified osteoarthritis, unspecified site: Secondary | ICD-10-CM | POA: Diagnosis not present

## 2023-03-01 DIAGNOSIS — F419 Anxiety disorder, unspecified: Secondary | ICD-10-CM | POA: Diagnosis not present

## 2023-03-01 DIAGNOSIS — E890 Postprocedural hypothyroidism: Secondary | ICD-10-CM | POA: Diagnosis not present

## 2023-03-01 DIAGNOSIS — I152 Hypertension secondary to endocrine disorders: Secondary | ICD-10-CM | POA: Diagnosis not present

## 2023-03-01 DIAGNOSIS — Z6841 Body Mass Index (BMI) 40.0 and over, adult: Secondary | ICD-10-CM | POA: Diagnosis not present

## 2023-03-01 DIAGNOSIS — E785 Hyperlipidemia, unspecified: Secondary | ICD-10-CM | POA: Diagnosis not present

## 2023-03-01 DIAGNOSIS — I69354 Hemiplegia and hemiparesis following cerebral infarction affecting left non-dominant side: Secondary | ICD-10-CM | POA: Diagnosis not present

## 2023-03-01 DIAGNOSIS — I890 Lymphedema, not elsewhere classified: Secondary | ICD-10-CM | POA: Diagnosis not present

## 2023-03-01 DIAGNOSIS — E876 Hypokalemia: Secondary | ICD-10-CM | POA: Diagnosis not present

## 2023-03-01 DIAGNOSIS — F32A Depression, unspecified: Secondary | ICD-10-CM | POA: Diagnosis not present

## 2023-03-03 ENCOUNTER — Telehealth: Payer: Self-pay | Admitting: Family Medicine

## 2023-03-03 DIAGNOSIS — Z0279 Encounter for issue of other medical certificate: Secondary | ICD-10-CM

## 2023-03-03 NOTE — Telephone Encounter (Signed)
1-DMV Placard form being sent back for signature  2- Long term home health form being sent back for completion  Contact patient when completed

## 2023-03-05 ENCOUNTER — Telehealth: Payer: Self-pay | Admitting: Family Medicine

## 2023-03-05 DIAGNOSIS — F419 Anxiety disorder, unspecified: Secondary | ICD-10-CM | POA: Diagnosis not present

## 2023-03-05 DIAGNOSIS — I890 Lymphedema, not elsewhere classified: Secondary | ICD-10-CM | POA: Diagnosis not present

## 2023-03-05 DIAGNOSIS — G709 Myoneural disorder, unspecified: Secondary | ICD-10-CM | POA: Diagnosis not present

## 2023-03-05 DIAGNOSIS — E1159 Type 2 diabetes mellitus with other circulatory complications: Secondary | ICD-10-CM | POA: Diagnosis not present

## 2023-03-05 DIAGNOSIS — I69354 Hemiplegia and hemiparesis following cerebral infarction affecting left non-dominant side: Secondary | ICD-10-CM | POA: Diagnosis not present

## 2023-03-05 DIAGNOSIS — N1831 Chronic kidney disease, stage 3a: Secondary | ICD-10-CM | POA: Diagnosis not present

## 2023-03-05 DIAGNOSIS — Z6841 Body Mass Index (BMI) 40.0 and over, adult: Secondary | ICD-10-CM | POA: Diagnosis not present

## 2023-03-05 DIAGNOSIS — I152 Hypertension secondary to endocrine disorders: Secondary | ICD-10-CM | POA: Diagnosis not present

## 2023-03-05 DIAGNOSIS — E876 Hypokalemia: Secondary | ICD-10-CM | POA: Diagnosis not present

## 2023-03-05 DIAGNOSIS — E1122 Type 2 diabetes mellitus with diabetic chronic kidney disease: Secondary | ICD-10-CM | POA: Diagnosis not present

## 2023-03-05 DIAGNOSIS — N179 Acute kidney failure, unspecified: Secondary | ICD-10-CM | POA: Diagnosis not present

## 2023-03-05 DIAGNOSIS — E890 Postprocedural hypothyroidism: Secondary | ICD-10-CM | POA: Diagnosis not present

## 2023-03-05 DIAGNOSIS — F32A Depression, unspecified: Secondary | ICD-10-CM | POA: Diagnosis not present

## 2023-03-05 DIAGNOSIS — M810 Age-related osteoporosis without current pathological fracture: Secondary | ICD-10-CM | POA: Diagnosis not present

## 2023-03-05 DIAGNOSIS — M1990 Unspecified osteoarthritis, unspecified site: Secondary | ICD-10-CM | POA: Diagnosis not present

## 2023-03-05 DIAGNOSIS — E785 Hyperlipidemia, unspecified: Secondary | ICD-10-CM | POA: Diagnosis not present

## 2023-03-05 NOTE — Telephone Encounter (Signed)
Patient needs virtual visit for form completion b/c we need to go over specifics of ADLs, etc

## 2023-03-05 NOTE — Telephone Encounter (Signed)
spoke with patient that DMV placard was ready to be picked up at front desk. She stated that Valla Leaver will be the person picked it up for her.   Also patient is still wondering about [employer] paperwork

## 2023-03-06 ENCOUNTER — Encounter: Payer: Self-pay | Admitting: Family Medicine

## 2023-03-06 ENCOUNTER — Telehealth (INDEPENDENT_AMBULATORY_CARE_PROVIDER_SITE_OTHER): Payer: Medicare Other | Admitting: Family Medicine

## 2023-03-06 DIAGNOSIS — M79669 Pain in unspecified lower leg: Secondary | ICD-10-CM | POA: Diagnosis not present

## 2023-03-06 DIAGNOSIS — Z8673 Personal history of transient ischemic attack (TIA), and cerebral infarction without residual deficits: Secondary | ICD-10-CM

## 2023-03-06 DIAGNOSIS — Z0279 Encounter for issue of other medical certificate: Secondary | ICD-10-CM | POA: Diagnosis not present

## 2023-03-06 NOTE — Telephone Encounter (Signed)
Form completed. Will be placed in fax box on Monday 12/16 for submitting via fax.

## 2023-03-06 NOTE — Progress Notes (Signed)
MyChart Video Visit    Virtual Visit via Video Note   This format is felt to be most appropriate for this patient at this time. Physical exam was limited by quality of the video and audio technology used for the visit.    Patient location: home Provider location: [facility] Persons involved in the visit: patient, provider  I discussed the limitations of evaluation and management by telemedicine and the availability of in person appointments. The patient expressed understanding and agreed to proceed.  Patient: Christina Rubio   DOB: 02-Feb-1951   72 y.o. Female  MRN: [id number] Visit Date: 03/06/2023  Today's healthcare provider: Shirlee Latch, MD   No chief complaint on file.  Subjective    HPI   Discussed the use of AI scribe software for clinical note transcription with the patient, who gave verbal consent to proceed.  History of Present Illness   The patient, with a history of stroke, presents with a swollen and warm shin after a hard bump against a metal bed frame a couple of weeks ago. The patient is currently on Eliquis, which may have exacerbated the bruising and swelling. They report that the area is sensitive to touch but does not cause constant pain. The patient also mentions occasional shortness of breath, which they manage by stopping their activity and taking deep breaths.  In terms of daily activities, the patient requires assistance or supervision with bathing, dressing, taking medicines, toileting, and eating due to physical impairments. They express nervousness about showering alone due to a high step into the shower and lack of grab bars. Dressing is challenging due to issues with pulling up underwear, attributed to back and leg problems and limited hand function. While the patient can take their own medicines, they need help with counting and handling them to prevent dropping. Toileting is manageable with the aid of a bidet and grab bars, but  pulling up pants can be difficult. The patient can prepare microwave meals but needs help with cooking on the stove. Transferring from chair to bed is improving, but the patient has fallen multiple times this year, necessitating ambulance calls.         Review of Systems      Objective    There were no vitals taken for this visit.      Physical Exam Constitutional:      General: She is not in acute distress.    Appearance: Normal appearance.  HENT:     Head: Normocephalic.  Pulmonary:     Effort: Pulmonary effort is normal. No respiratory distress.  Neurological:     Mental Status: She is alert and oriented to person, place, and time. Mental status is at baseline.        Assessment & Plan     Problem List Items Addressed This Visit       Other   History of CVA (cerebrovascular accident) - Primary   Other Visit Diagnoses       Pain in shin, unspecified laterality         Encounter for issue of other medical certificate               Shin Contusion Swelling and warmth in the shin following a hard bump against a metal bed frame two weeks ago, likely exacerbated by Eliquis. No signs of infection. Pain only upon pressure. Prolonged bruising and swelling likely due to anticoagulant effect of Eliquis. X-ray may be necessary if symptoms persist or worsen. -  Advise continued use of ice and elevation - Monitor for worsening symptoms or lack of improvement - Consider x-ray if symptoms persist or worsen  Shortness of Breath Occasional shortness of breath resolving with rest and deep breathing. No associated lung congestion or other respiratory symptoms. Home health aides report normal oxygen levels, pulse, and blood pressure. Further evaluation if symptoms persist or worsen. - Advise to schedule an appointment if symptoms persist or worsen - Perform lung auscultation and further evaluation if symptoms continue  Stroke Stroke has led to physical impairments  affecting daily activities such as bathing, dressing, and transferring. Requires supervision during bathing, assistance with dressing, medication management, toileting, and eating. Documented these needs in long-term care paperwork. - Document need for supervision and assistance in long-term care paperwork  General Health Maintenance On Eliquis, delaying cataract surgery due to anticoagulation. Blood pressure well-controlled with current medication. - Monitor blood pressure regularly - Plan for cataract surgery after appropriate anticoagulation management  Follow-up - Schedule follow-up appointment if shin swelling and pain persist - Fax long-term care paperwork on Monday - Arrange for pick up of DMV handicap placard.        No orders of the defined types were placed in this encounter.    Return if symptoms worsen or fail to improve.     I discussed the assessment and treatment plan with the patient. The patient was provided an opportunity to ask questions and all were answered. The patient agreed with the plan and demonstrated an understanding of the instructions.   The patient was advised to call back or seek an in-person evaluation if the symptoms worsen or if the condition fails to improve as anticipated.  Total time spent on today's visit was greater than 40 minutes, including nonface-to-face time personally spent on review of chart (labs and imaging), discussing further work-up, treatment options, form completion, answering patient's questions, and coordinating care.   Shirlee Latch, MD [facility] [phone] (phone) [phone] (fax)  [facility]

## 2023-03-09 DIAGNOSIS — H25812 Combined forms of age-related cataract, left eye: Secondary | ICD-10-CM | POA: Diagnosis not present

## 2023-03-09 NOTE — Telephone Encounter (Signed)
Form faxed and sent to scan center

## 2023-03-24 ENCOUNTER — Ambulatory Visit (INDEPENDENT_AMBULATORY_CARE_PROVIDER_SITE_OTHER): Payer: Medicare Other

## 2023-03-24 DIAGNOSIS — Z0000 Encounter for general adult medical examination without abnormal findings: Secondary | ICD-10-CM

## 2023-03-24 NOTE — Patient Instructions (Addendum)
 Christina Rubio , Thank you for taking time to come for your Medicare Wellness Visit. I appreciate your ongoing commitment to your health goals. Please review the following plan we discussed and let me know if I can assist you in the future.   Referrals/Orders/Follow-Ups/Clinician Recommendations: NONE  This is a list of the screening recommended for you and due dates:  Health Maintenance  Topic Date Due   Colon Cancer Screening  Never done   Mammogram  Never done   DEXA scan (bone density measurement)  Never done   Zoster (Shingles) Vaccine (2 of 2) 01/12/2018   Yearly kidney health urinalysis for diabetes  08/09/2022   COVID-19 Vaccine (3 - 2024-25 season) 11/23/2022   Hemoglobin A1C  05/14/2023   Complete foot exam   11/11/2023   Eye exam for diabetics  11/13/2023   Yearly kidney function blood test for diabetes  11/23/2023   Medicare Annual Wellness Visit  03/23/2024   DTaP/Tdap/Td vaccine (2 - Tdap) 05/23/2028   Pneumonia Vaccine  Completed   Flu Shot  Completed   Hepatitis C Screening  Completed   HPV Vaccine  Aged Out    Advanced directives: (ACP Link)Information on Advanced Care Planning can be found at [place]  Secretary of State Advance Health Care Directives Advance Health Care Directives ([email or web address]   Next Medicare Annual Wellness Visit scheduled for next year: Yes    03/30/24 @ 10:10 AM BY VIDEO

## 2023-03-24 NOTE — Progress Notes (Signed)
 Subjective:   Christina Rubio is a 72 y.o. female who presents for Medicare Annual (Subsequent) preventive examination.  Visit Complete: Virtual I connected with  Ronal Idol on 03/24/23 by a audio enabled telemedicine application and verified that I am speaking with the correct person using two identifiers.  Patient Location: Home  Provider Location: Office/Clinic  I discussed the limitations of evaluation and management by telemedicine. The patient expressed understanding and agreed to proceed.  Vital Signs: Because this visit was a virtual/telehealth visit, some criteria may be missing or patient reported. Any vitals not documented were not able to be obtained and vitals that have been documented are patient reported.  Cardiac Risk Factors include: advanced age (>90men, >70 women);obesity (BMI >30kg/m2);diabetes mellitus;hypertension;dyslipidemia;sedentary lifestyle     Objective:    There were no vitals filed for this visit. There is no height or weight on file to calculate BMI.     03/24/2023   10:00 AM 11/21/2022   11:10 PM 03/11/2022    2:28 PM 03/04/2021   11:12 AM 07/06/2019    1:49 PM 09/11/2018    8:43 PM 05/11/2018    1:47 PM  Advanced Directives  Does Patient Have a Medical Advance Directive? No No No No No No No  Would patient like information on creating a medical advance directive? No - Patient declined No - Patient declined No - Patient declined No - Patient declined No - Patient declined  Yes (MAU/Ambulatory/Procedural Areas - Information given)    Current Medications (verified) Outpatient Encounter Medications as of 03/24/2023  Medication Sig   ALPRAZolam  (XANAX ) 0.5 MG tablet TAKE 1 TABLET BY MOUTH EVERY DAY AS NEEDED FOR ANXIETY.   apixaban  (ELIQUIS ) 5 MG TABS tablet Take 1 tablet (5 mg total) by mouth 2 (two) times daily.   atorvastatin  (LIPITOR) 20 MG tablet TAKE 1 TABLET BY MOUTH DAILY   Blood Glucose Monitoring Suppl DEVI 1 each by Does not apply  route in the morning, at noon, and at bedtime. May substitute to any manufacturer covered by patient's insurance.   buPROPion  (WELLBUTRIN  XL) 300 MG 24 hr tablet Take 300 mg by mouth daily.   levothyroxine  (SYNTHROID ) 112 MCG tablet TAKE 1 TABLET EVERY DAY ON EMPTY STOMACHWITH A GLASS OF WATER AT LEAST 30-60 MINBEFORE BREAKFAST   loperamide  (IMODIUM  A-D) 2 MG tablet Take 1 tablet (2 mg total) by mouth 4 (four) times daily as needed for diarrhea or loose stools.   metoprolol  succinate (TOPROL -XL) 50 MG 24 hr tablet TAKE 1 TABLET BY MOUTH DAILY WITH OR IMMEDIATELY FOLLOWING A MEAL   Multiple Vitamins-Minerals (PRESERVISION AREDS 2) CAPS Take 1 tablet by mouth 2 (two) times daily.   oxybutynin  (DITROPAN -XL) 5 MG 24 hr tablet Take 1 tablet (5 mg total) by mouth at bedtime.   Potassium 99 MG TABS Take 1 tablet by mouth daily in the afternoon.   Probiotic Product (PROBIOTIC PO) Take by mouth daily.   sertraline  (ZOLOFT ) 100 MG tablet TAKE 2 TABLETS BY MOUTH DAILY WITH BREAKFAST   valsartan -hydrochlorothiazide  (DIOVAN -HCT) 160-25 MG tablet TAKE 1 TABLET BY MOUTH NIGHTLY   azelastine  (ASTELIN ) 0.1 % nasal spray Place 1 spray into both nostrils 2 (two) times daily. Use in each nostril as directed (Patient not taking: Reported on 03/24/2023)   No facility-administered encounter medications on file as of 03/24/2023.    Allergies (verified) Shellfish allergy, Strawberry extract, Epinephrine , and Penicillins   History: Past Medical History:  Diagnosis Date   Anxiety    Arthritis  Atrial fibrillation (HCC)    Depression    Grade II diastolic dysfunction    History of CVA (cerebrovascular accident)    Hyperlipidemia    Hypertension    Morbid obesity with BMI of 60.0-69.9, adult (HCC)    Neuromuscular disorder (HCC)    Osteoporosis    PONV (postoperative nausea and vomiting)    Skin cancer    non melanoma   Spinal stenosis    Thyroid  disease    hyperthyroid s/p partial thyroidectomy   Past  Surgical History:  Procedure Laterality Date   CESAREAN SECTION     CHOLECYSTECTOMY     COLON SURGERY     FEMUR IM NAIL Right 01/09/2016   Procedure: INTRAMEDULLARY (IM) RETROGRADE FEMORAL NAILING RIGHT DISTAL FEMUR;  Surgeon: Norleen Gavel, MD;  Location: [facility] OR;  Service: Orthopedics;  Laterality: Right;   LAPAROSCOPIC GASTRIC SLEEVE RESECTION     Right thyroidectomy     TONSILLECTOMY     Family History  Problem Relation Age of Onset   Lung cancer Mother    Prostate cancer Father    Depression Daughter    Anemia Daughter        PNH   Thyroid  cancer Maternal Uncle    Lung cancer Paternal Uncle    Stroke Maternal Grandmother    Stroke Maternal Grandfather    Stroke Paternal Grandmother    Healthy Sister    Healthy Brother    Healthy Sister    Emphysema Paternal Grandfather    Colon cancer Neg Hx    Breast cancer Neg Hx    Ovarian cancer Neg Hx    Cervical cancer Neg Hx    Social History   Socioeconomic History   Marital status: Widowed    Spouse name: Not on file   Number of children: 1   Years of education: 2 master's degrees   Highest education level: Master's degree (e.g., MA, MS, MEng, MEd, MSW, MBA)  Occupational History   Occupation: retired    Comment: advice worker  Tobacco Use   Smoking status: Never   Smokeless tobacco: Never  Vaping Use   Vaping status: Never Used  Substance and Sexual Activity   Alcohol use: Yes    Comment: 1-2 drinks per year   Drug use: No   Sexual activity: Never  Other Topics Concern   Not on file  Social History Narrative   Not on file   Social Drivers of Health   Financial Resource Strain: Low Risk  (03/24/2023)   Overall Financial Resource Strain (CARDIA)    Difficulty of Paying Living Expenses: Not hard at all  Food Insecurity: No Food Insecurity (03/24/2023)   Hunger Vital Sign    Worried About Running Out of Food in the Last Year: Never true    Ran Out of Food in the Last Year: Never true  Transportation Needs:  No Transportation Needs (03/24/2023)   PRAPARE - Administrator, Civil Service (Medical): No    Lack of Transportation (Non-Medical): No  Physical Activity: Inactive (03/24/2023)   Exercise Vital Sign    Days of Exercise per Week: 0 days    Minutes of Exercise per Session: 0 min  Stress: No Stress Concern Present (03/24/2023)   [employer] of Occupational Health - Occupational Stress Questionnaire    Feeling of Stress : Only a little  Social Connections: Socially Isolated (03/24/2023)   Social Connection and Isolation Panel [NHANES]    Frequency of Communication with Friends and Family: More  than three times a week    Frequency of Social Gatherings with Friends and Family: More than three times a week    Attends Religious Services: Never    Database Administrator or Organizations: No    Attends Banker Meetings: Never    Marital Status: Widowed    Tobacco Counseling Counseling given: Not Answered   Clinical Intake:  Pre-visit preparation completed: Yes  Pain : No/denies pain     BMI - recorded: 63.9 Nutritional Status: BMI > 30  Obese Nutritional Risks: None Diabetes: No  How often do you need to have someone help you when you read instructions, pamphlets, or other written materials from your doctor or pharmacy?: 1 - Never  Interpreter Needed?: No  Information entered by :: JHONNIE DAS, LPN   Activities of Daily Living    03/24/2023   10:03 AM 11/22/2022    8:00 PM  In your present state of health, do you have any difficulty performing the following activities:  Hearing? 0 0  Vision? 0 0  Difficulty concentrating or making decisions? 0 0  Walking or climbing stairs? 1 0  Dressing or bathing? 1 0  Doing errands, shopping? 1 0  Preparing Food and eating ? N   Using the Toilet? N   In the past six months, have you accidently leaked urine? Y   Do you have problems with loss of bowel control? N   Managing your Medications? Y    Managing your Finances? N   Housekeeping or managing your Housekeeping? Y     Patient Care Team: Myrla Jon HERO, MD as PCP - General (Family Medicine) Yvone Rush, MD as Consulting Physician (Orthopedic Surgery) End, Lonni, MD as Consulting Physician (Cardiology) Laurice Francis NOVAK, OD (Optometry)  Indicate any recent Medical Services you may have received from other than [facility] providers in the past year (date may be approximate).     Assessment:   This is a routine wellness examination for [facility]  Hearing/Vision screen Hearing Screening - Comments:: NO AIDS Vision Screening - Comments:: CATARACTS, HAVING SGY 04/16/23- [facility]   Goals Addressed             This Visit's Progress    Cut out extra servings         Depression Screen    03/24/2023    9:58 AM 01/06/2023    2:24 PM 11/11/2022   10:32 AM 03/11/2022    2:24 PM 08/08/2021    9:46 AM 03/04/2021   11:05 AM 12/04/2020    9:18 AM  PHQ 2/9 Scores  PHQ - 2 Score 2 4 2 1 2 1 4   PHQ- 9 Score  16 8 3 9 1 16     Fall Risk    03/24/2023   10:02 AM 01/06/2023    2:23 PM 11/11/2022   10:31 AM 05/20/2022    9:15 AM 03/14/2022   10:56 AM  Fall Risk   Falls in the past year? 1 1 1 1  0  Number falls in past yr: 1 1 1  0 0  Injury with Fall? 0 0 1 0 0  Risk for fall due to : History of fall(s);Impaired mobility History of fall(s) History of fall(s);Impaired mobility;Impaired balance/gait History of fall(s) Impaired balance/gait;Impaired mobility  Follow up Falls evaluation completed;Falls prevention discussed  Falls evaluation completed Falls evaluation completed Falls evaluation completed;Education provided;Falls prevention discussed    MEDICARE RISK AT HOME: Medicare Risk at Home Any stairs in or around  the home?: Yes If so, are there any without handrails?: No Home free of loose throw rugs in walkways, pet beds, electrical cords, etc?: Yes Adequate lighting in your home to reduce risk of falls?:  Yes Life alert?: No Use of a cane, walker or w/c?: Yes (WALKER ALWAYS) Grab bars in the bathroom?: Yes Shower chair or bench in shower?: No Elevated toilet seat or a handicapped toilet?: Yes  TIMED UP AND GO:  Was the test performed?  No    Cognitive Function:        03/24/2023   10:05 AM 03/11/2022    2:36 PM 07/06/2019    2:00 PM 05/11/2018    1:57 PM  6CIT Screen  What Year? 0 points 0 points 0 points 0 points  What month? 0 points 0 points 0 points 0 points  What time? 0 points 0 points 0 points 0 points  Count back from 20 0 points 0 points 0 points 0 points  Months in reverse 0 points 0 points 0 points 0 points  Repeat phrase 2 points 0 points 0 points 0 points  Total Score 2 points 0 points 0 points 0 points    Immunizations Immunization History  Administered Date(s) Administered   Fluad Quad(high Dose 65+) 01/06/2019, 12/04/2020, 11/11/2022   Influenza, High Dose Seasonal PF 01/29/2017, 04/20/2018   Influenza,inj,Quad PF,6+ Mos 01/26/2020   PFIZER(Purple Top)SARS-COV-2 Vaccination 05/20/2019, 06/14/2019   Pneumococcal Conjugate-13 05/28/2017   Pneumococcal Polysaccharide-23 07/15/2019   Td 05/24/2018   Zoster Recombinant(Shingrix ) 11/17/2017    TDAP status: Up to date  Flu Vaccine status: Up to date  Pneumococcal vaccine status: Up to date  Covid-19 vaccine status: Completed vaccines  Qualifies for Shingles Vaccine? Yes   Zostavax completed No   Shingrix  Completed?: No.    Education has been provided regarding the importance of this vaccine. Patient has been advised to call insurance company to determine out of pocket expense if they have not yet received this vaccine. Advised may also receive vaccine at local pharmacy or Health Dept. Verbalized acceptance and understanding.  Screening Tests Health Maintenance  Topic Date Due   Colonoscopy  Never done   MAMMOGRAM  Never done   DEXA SCAN  Never done   Zoster Vaccines- Shingrix  (2 of 2) 01/12/2018    Diabetic kidney evaluation - Urine ACR  08/09/2022   COVID-19 Vaccine (3 - 2024-25 season) 11/23/2022   HEMOGLOBIN A1C  05/14/2023   FOOT EXAM  11/11/2023   OPHTHALMOLOGY EXAM  11/13/2023   Diabetic kidney evaluation - eGFR measurement  11/23/2023   Medicare Annual Wellness (AWV)  03/23/2024   DTaP/Tdap/Td (2 - Tdap) 05/23/2028   Pneumonia Vaccine 61+ Years old  Completed   INFLUENZA VACCINE  Completed   Hepatitis C Screening  Completed   HPV VACCINES  Aged Out    Health Maintenance  Health Maintenance Due  Topic Date Due   Colonoscopy  Never done   MAMMOGRAM  Never done   DEXA SCAN  Never done   Zoster Vaccines- Shingrix  (2 of 2) 01/12/2018   Diabetic kidney evaluation - Urine ACR  08/09/2022   COVID-19 Vaccine (3 - 2024-25 season) 11/23/2022    DECLINED REFERRAL FOR COLONOSCOPY  DECLINED REFERRAL FOR MAMMOGRAM  DECLINED REFERRAL FOR BDS  Lung Cancer Screening: (Low Dose CT Chest recommended if Age 32-80 years, 20 pack-year currently smoking OR have quit w/in 15years.) does not qualify.    Additional Screening:  Hepatitis C Screening: does qualify; Completed 05/24/18  Vision Screening: Recommended annual ophthalmology exams for early detection of glaucoma and other disorders of the eye. Is the patient up to date with their annual eye exam?  Yes  Who is the provider or what is the name of the office in which the patient attends annual eye exams? [facility] If pt is not established with a provider, would they like to be referred to a provider to establish care? No .   Dental Screening: Recommended annual dental exams for proper oral hygiene  Diabetic Foot Exam: Diabetic Foot Exam: Completed 11/11/22  Community Resource Referral / Chronic Care Management: CRR required this visit?  No   CCM required this visit?  No     Plan:     I have personally reviewed and noted the following in the patient's chart:   Medical and social history Use of alcohol, tobacco or  illicit drugs  Current medications and supplements including opioid prescriptions. Patient is not currently taking opioid prescriptions. Functional ability and status Nutritional status Physical activity Advanced directives List of other physicians Hospitalizations, surgeries, and ER visits in previous 12 months Vitals Screenings to include cognitive, depression, and falls Referrals and appointments  In addition, I have reviewed and discussed with patient certain preventive protocols, quality metrics, and best practice recommendations. A written personalized care plan for preventive services as well as general preventive health recommendations were provided to patient.     Jhonnie GORMAN Das, LPN   [id number]   After Visit Summary: (MyChart) Due to this being a telephonic visit, the after visit summary with patients personalized plan was offered to patient via MyChart   Nurse Notes: NONE

## 2023-03-26 ENCOUNTER — Other Ambulatory Visit: Payer: Self-pay | Admitting: Family Medicine

## 2023-03-30 NOTE — Telephone Encounter (Signed)
 Requested medication (s) are due for refill today: review  Requested medication (s) are on the active medication list: no  Last refill:  02/03/22  Future visit scheduled: yes  Notes to clinic:  unsure who last prescribed or dc'd.      Requested Prescriptions  Pending Prescriptions Disp Refills   buPROPion  (WELLBUTRIN  XL) 150 MG 24 hr tablet [Pharmacy Med Name: BUPROPION  HCL ER (XL) 150 MG TAB] 90 tablet     Sig: TAKE 1 TABLET BY MOUTH DAILY WITH BREAKFAST     Psychiatry: Antidepressants - bupropion  Passed - 03/30/2023 12:05 PM      Passed - Cr in normal range and within 360 days    Creatinine, Ser  Date Value Ref Range Status  11/23/2022 0.93 0.44 - 1.00 mg/dL Final         Passed - AST in normal range and within 360 days    AST  Date Value Ref Range Status  11/21/2022 16 15 - 41 U/L Final         Passed - ALT in normal range and within 360 days    ALT  Date Value Ref Range Status  11/21/2022 10 0 - 44 U/L Final         Passed - Completed PHQ-2 or PHQ-9 in the last 360 days      Passed - Last BP in normal range    BP Readings from Last 1 Encounters:  01/06/23 105/65         Passed - Valid encounter within last 6 months    Recent Outpatient Visits           3 weeks ago History of CVA (cerebrovascular accident)   [place] [facility] [place], Jon HERO, MD   2 months ago OAB (overactive bladder)   [place] [facility] Myrla Jon HERO, MD   2 months ago Concern about skin disease without diagnosis   [place] [facility] [place], Jon HERO, MD   4 months ago History of CVA (cerebrovascular accident)   [place] [facility] Myrla, Jon HERO, MD   4 months ago Encounter for annual physical exam    [place] [facility] [place], Jon HERO, MD       Future Appointments             In 1 month Bacigalupo, Jon HERO, MD [facility]

## 2023-04-02 DIAGNOSIS — R7303 Prediabetes: Secondary | ICD-10-CM | POA: Diagnosis not present

## 2023-04-02 DIAGNOSIS — E039 Hypothyroidism, unspecified: Secondary | ICD-10-CM | POA: Diagnosis not present

## 2023-04-02 DIAGNOSIS — I482 Chronic atrial fibrillation, unspecified: Secondary | ICD-10-CM | POA: Diagnosis not present

## 2023-04-02 DIAGNOSIS — Z8673 Personal history of transient ischemic attack (TIA), and cerebral infarction without residual deficits: Secondary | ICD-10-CM | POA: Diagnosis not present

## 2023-04-02 DIAGNOSIS — F419 Anxiety disorder, unspecified: Secondary | ICD-10-CM | POA: Diagnosis not present

## 2023-04-02 DIAGNOSIS — I10 Essential (primary) hypertension: Secondary | ICD-10-CM | POA: Diagnosis not present

## 2023-04-16 ENCOUNTER — Other Ambulatory Visit: Payer: Self-pay | Admitting: Family Medicine

## 2023-04-16 DIAGNOSIS — Z880 Allergy status to penicillin: Secondary | ICD-10-CM | POA: Diagnosis not present

## 2023-04-16 DIAGNOSIS — Z6841 Body Mass Index (BMI) 40.0 and over, adult: Secondary | ICD-10-CM | POA: Diagnosis not present

## 2023-04-16 DIAGNOSIS — E034 Atrophy of thyroid (acquired): Secondary | ICD-10-CM

## 2023-04-16 DIAGNOSIS — I69334 Monoplegia of upper limb following cerebral infarction affecting left non-dominant side: Secondary | ICD-10-CM | POA: Diagnosis not present

## 2023-04-16 DIAGNOSIS — I10 Essential (primary) hypertension: Secondary | ICD-10-CM | POA: Diagnosis not present

## 2023-04-16 DIAGNOSIS — H25812 Combined forms of age-related cataract, left eye: Secondary | ICD-10-CM | POA: Diagnosis not present

## 2023-04-16 DIAGNOSIS — Z7901 Long term (current) use of anticoagulants: Secondary | ICD-10-CM | POA: Diagnosis not present

## 2023-04-16 DIAGNOSIS — Z885 Allergy status to narcotic agent status: Secondary | ICD-10-CM | POA: Diagnosis not present

## 2023-04-16 DIAGNOSIS — Z79899 Other long term (current) drug therapy: Secondary | ICD-10-CM | POA: Diagnosis not present

## 2023-04-16 NOTE — Telephone Encounter (Signed)
Requested Prescriptions  Pending Prescriptions Disp Refills   levothyroxine (SYNTHROID) 112 MCG tablet [Pharmacy Med Name: LEVOTHYROXINE SODIUM 112 MCG TAB] 90 tablet 1    Sig: TAKE 1 TABLET EVERY DAY ON EMPTY STOMACHWITH A GLASS OF WATER AT LEAST 30-60 MINBEFORE BREAKFAST     Endocrinology:  Hypothyroid Agents Failed - 04/16/2023  2:54 PM      Failed - TSH in normal range and within 360 days    TSH  Date Value Ref Range Status  11/11/2022 7.190 (H) 0.450 - 4.500 uIU/mL Final         Passed - Valid encounter within last 12 months    Recent Outpatient Visits           1 month ago History of CVA (cerebrovascular accident)   [place] [facility] [place], Marzella Schlein, MD   3 months ago OAB (overactive bladder)   [place] [facility] Erasmo Downer, MD   3 months ago Concern about skin disease without diagnosis   [facility] [place], Marzella Schlein, MD   4 months ago History of CVA (cerebrovascular accident)   [facility] [place] Flock, Marzella Schlein, MD   5 months ago Encounter for annual physical exam   [place] [facility] [place], Marzella Schlein, MD       Future Appointments             In 4 weeks Bacigalupo, Marzella Schlein, MD [facility]

## 2023-04-18 DIAGNOSIS — E876 Hypokalemia: Secondary | ICD-10-CM

## 2023-04-18 DIAGNOSIS — G709 Myoneural disorder, unspecified: Secondary | ICD-10-CM

## 2023-04-18 DIAGNOSIS — E1122 Type 2 diabetes mellitus with diabetic chronic kidney disease: Secondary | ICD-10-CM | POA: Diagnosis not present

## 2023-04-18 DIAGNOSIS — I69354 Hemiplegia and hemiparesis following cerebral infarction affecting left non-dominant side: Secondary | ICD-10-CM | POA: Diagnosis not present

## 2023-04-18 DIAGNOSIS — E785 Hyperlipidemia, unspecified: Secondary | ICD-10-CM

## 2023-04-18 DIAGNOSIS — E890 Postprocedural hypothyroidism: Secondary | ICD-10-CM

## 2023-04-18 DIAGNOSIS — I152 Hypertension secondary to endocrine disorders: Secondary | ICD-10-CM

## 2023-04-18 DIAGNOSIS — N179 Acute kidney failure, unspecified: Secondary | ICD-10-CM | POA: Diagnosis not present

## 2023-04-18 DIAGNOSIS — E1159 Type 2 diabetes mellitus with other circulatory complications: Secondary | ICD-10-CM

## 2023-04-18 DIAGNOSIS — I480 Paroxysmal atrial fibrillation: Secondary | ICD-10-CM

## 2023-04-18 DIAGNOSIS — I890 Lymphedema, not elsewhere classified: Secondary | ICD-10-CM

## 2023-04-18 DIAGNOSIS — N1831 Chronic kidney disease, stage 3a: Secondary | ICD-10-CM | POA: Diagnosis not present

## 2023-05-12 ENCOUNTER — Other Ambulatory Visit: Payer: Self-pay | Admitting: Family Medicine

## 2023-05-12 DIAGNOSIS — G5601 Carpal tunnel syndrome, right upper limb: Secondary | ICD-10-CM | POA: Diagnosis not present

## 2023-05-12 DIAGNOSIS — I639 Cerebral infarction, unspecified: Secondary | ICD-10-CM | POA: Diagnosis not present

## 2023-05-13 NOTE — Telephone Encounter (Signed)
 Requested Prescriptions  Pending Prescriptions Disp Refills   ELIQUIS 5 MG TABS tablet [Pharmacy Med Name: ELIQUIS 5 MG TAB] 60 tablet 2    Sig: TAKE 1 TABLET BY MOUTH TWICE DAILY     Hematology:  Anticoagulants - apixaban Passed - 05/13/2023 12:14 PM      Passed - PLT in normal range and within 360 days    Platelets  Date Value Ref Range Status  11/21/2022 280 150 - 400 K/uL Final  05/28/2017 266 150 - 379 x10E3/uL Final         Passed - HGB in normal range and within 360 days    Hemoglobin  Date Value Ref Range Status  11/21/2022 13.8 12.0 - 15.0 g/dL Final  03/47/4259 56.3 11.1 - 15.9 g/dL Final         Passed - HCT in normal range and within 360 days    HCT  Date Value Ref Range Status  11/21/2022 42.5 36.0 - 46.0 % Final   Hematocrit  Date Value Ref Range Status  05/28/2017 41.5 34.0 - 46.6 % Final         Passed - Cr in normal range and within 360 days    Creatinine, Ser  Date Value Ref Range Status  11/23/2022 0.93 0.44 - 1.00 mg/dL Final         Passed - AST in normal range and within 360 days    AST  Date Value Ref Range Status  11/21/2022 16 15 - 41 U/L Final         Passed - ALT in normal range and within 360 days    ALT  Date Value Ref Range Status  11/21/2022 10 0 - 44 U/L Final         Passed - Valid encounter within last 12 months    Recent Outpatient Visits           2 months ago History of CVA (cerebrovascular accident)   [facility] [place], Marzella Schlein, MD   4 months ago OAB (overactive bladder)   [place] [facility] [place], Marzella Schlein, MD   4 months ago Concern about skin disease without diagnosis   [place] [facility] [place], Marzella Schlein, MD   5 months ago History of CVA (cerebrovascular accident)   [place] [facility] [place] Flock, Marzella Schlein, MD   6 months ago Encounter for annual physical exam    [facility] [place],  Marzella Schlein, MD       Future Appointments             In 5 days Bacigalupo, Marzella Schlein, MD [facility]

## 2023-05-14 ENCOUNTER — Ambulatory Visit: Payer: Self-pay | Admitting: Family Medicine

## 2023-05-18 ENCOUNTER — Ambulatory Visit: Payer: Medicare Other | Admitting: Family Medicine

## 2023-06-01 ENCOUNTER — Ambulatory Visit: Payer: Medicare Other | Admitting: Family Medicine

## 2023-06-11 ENCOUNTER — Ambulatory Visit: Payer: Self-pay | Admitting: Family Medicine

## 2023-06-11 NOTE — Telephone Encounter (Signed)
 Copied from CRM [id number]. Topic: Clinical - Red Word Triage >> Jun 11, 2023 10:33 AM Turkey B wrote: [facility] that prompted transfer to Nurse Triage: pt has pain in left shoulder when she picks up somethng  Chief Complaint: left shoulder pain Symptoms: pain when using the arm Frequency: constant when she uses it Pertinent Negatives: Patient denies fever, numbness, cp Disposition: [x] ED /[] Urgent Care (no appt availability in office) / [] Appointment(In office/virtual)/ []  [place] [facility]/ [] Home Care/ [] Refused Recommended Disposition /[] [place] [facility]/ []  Follow-up with PCP Additional Notes: hx CVA, DM, per protocol instructed to go to the ER; care advice given, denies questions; pcp office updated.   Reason for Disposition  [1] SEVERE pain AND [2] not improved 2 hours after pain medicine  Answer Assessment - Initial Assessment Questions 1. ONSET: "When did the pain start?"     Sunday 2. LOCATION: "Where is the pain located?"     Left shoulder and upper arm 3. PAIN: "How bad is the pain?" (Scale 1-10; or mild, moderate, severe)   - MILD (1-3): doesn't interfere with normal activities   - MODERATE (4-7): interferes with normal activities (e.g., work or school) or awakens from sleep   - SEVERE (8-10): excruciating pain, unable to do any normal activities, unable to move arm at all due to pain     5/10 every time she picks something up 4. WORK OR EXERCISE: "Has there been any recent work or exercise that involved this part of the body?"     Going through stuff at home and shoulder is hurting 5. CAUSE: "What do you think is causing the shoulder pain?"     Over use  6. OTHER SYMPTOMS: "Do you have any other symptoms?" (e.g., neck pain, swelling, rash, fever, numbness, weakness)     denies 7. PREGNANCY: "Is there any chance you are pregnant?" "When was your last menstrual period?"     na  Protocols used: Shoulder Pain-A-AH

## 2023-06-16 ENCOUNTER — Other Ambulatory Visit: Payer: Self-pay | Admitting: Family Medicine

## 2023-06-17 NOTE — Telephone Encounter (Signed)
 Requested Prescriptions  Pending Prescriptions Disp Refills   oxybutynin (DITROPAN-XL) 5 MG 24 hr tablet [Pharmacy Med Name: OXYBUTYNIN CHLORIDE ER 5 MG TAB] 30 tablet 2    Sig: TAKE 1 TABLET BY MOUTH AT BEDTIME.     Urology:  Bladder Agents Failed - 06/17/2023  4:07 PM      Failed - Valid encounter within last 12 months    Recent Outpatient Visits   None     Future Appointments             In 1 week Bacigalupo, Marzella Schlein, MD [facility]

## 2023-06-22 ENCOUNTER — Ambulatory Visit: Admitting: Family Medicine

## 2023-06-25 ENCOUNTER — Ambulatory Visit: Admitting: Family Medicine

## 2023-07-21 ENCOUNTER — Ambulatory Visit: Admitting: Family Medicine

## 2023-07-28 DIAGNOSIS — R200 Anesthesia of skin: Secondary | ICD-10-CM | POA: Diagnosis not present

## 2023-07-31 ENCOUNTER — Telehealth: Payer: Self-pay

## 2023-07-31 NOTE — Telephone Encounter (Signed)
 Copied from CRM [id number]. Topic: Clinical - Order For Equipment >> Jul 31, 2023 11:56 AM Christina Rubio wrote: Reason for CRM: patient called to see if she could get a rolling wheelchair as one of the wheels is completely bent. Please f/u with patient

## 2023-08-03 NOTE — Telephone Encounter (Signed)
 Likely needs face-to-face to order DME.  Can be virtual or in person. Can anyone see her in the next couple of days for this?

## 2023-08-03 NOTE — Telephone Encounter (Signed)
 Patient already has appt scheduled for next week 5/22 with provider

## 2023-08-10 ENCOUNTER — Other Ambulatory Visit: Payer: Self-pay | Admitting: Family Medicine

## 2023-08-13 ENCOUNTER — Ambulatory Visit: Admitting: Family Medicine

## 2023-08-13 ENCOUNTER — Encounter: Payer: Self-pay | Admitting: Family Medicine

## 2023-08-13 VITALS — BP 124/57 | HR 48 | Ht 64.0 in

## 2023-08-13 DIAGNOSIS — M4807 Spinal stenosis, lumbosacral region: Secondary | ICD-10-CM

## 2023-08-13 DIAGNOSIS — B372 Candidiasis of skin and nail: Secondary | ICD-10-CM

## 2023-08-13 DIAGNOSIS — I152 Hypertension secondary to endocrine disorders: Secondary | ICD-10-CM | POA: Diagnosis not present

## 2023-08-13 DIAGNOSIS — E785 Hyperlipidemia, unspecified: Secondary | ICD-10-CM | POA: Diagnosis not present

## 2023-08-13 DIAGNOSIS — E034 Atrophy of thyroid (acquired): Secondary | ICD-10-CM | POA: Diagnosis not present

## 2023-08-13 DIAGNOSIS — E1169 Type 2 diabetes mellitus with other specified complication: Secondary | ICD-10-CM | POA: Diagnosis not present

## 2023-08-13 DIAGNOSIS — E1159 Type 2 diabetes mellitus with other circulatory complications: Secondary | ICD-10-CM | POA: Diagnosis not present

## 2023-08-13 DIAGNOSIS — Z6841 Body Mass Index (BMI) 40.0 and over, adult: Secondary | ICD-10-CM

## 2023-08-13 DIAGNOSIS — I890 Lymphedema, not elsewhere classified: Secondary | ICD-10-CM

## 2023-08-13 DIAGNOSIS — F331 Major depressive disorder, recurrent, moderate: Secondary | ICD-10-CM

## 2023-08-13 DIAGNOSIS — F411 Generalized anxiety disorder: Secondary | ICD-10-CM

## 2023-08-13 DIAGNOSIS — Z8673 Personal history of transient ischemic attack (TIA), and cerebral infarction without residual deficits: Secondary | ICD-10-CM

## 2023-08-13 DIAGNOSIS — R2689 Other abnormalities of gait and mobility: Secondary | ICD-10-CM

## 2023-08-13 DIAGNOSIS — I480 Paroxysmal atrial fibrillation: Secondary | ICD-10-CM

## 2023-08-13 MED ORDER — CLOTRIMAZOLE-BETAMETHASONE 1-0.05 % EX CREA: 1.0000 | TOPICAL_CREAM | Freq: Every day | CUTANEOUS | 2 refills | Status: AC

## 2023-08-13 MED ORDER — ALPRAZOLAM 0.5 MG PO TABS: 0.5000 mg | ORAL_TABLET | Freq: Every day | ORAL | 2 refills | Status: DC | PRN

## 2023-08-13 NOTE — Assessment & Plan Note (Signed)
 Discussed importance of healthy weight management Discussed diet and exercise

## 2023-08-13 NOTE — Assessment & Plan Note (Signed)
 Chronic depression and anxiety managed with Zoloft  and Xanax . Increased anxiety noted, requiring more frequent use of Xanax . Experiences good and challenging days, with recent increased stressors. Xanax  is used as needed for potential panic attacks. - Increase Xanax  prescription to 30 pills per month for as-needed use. - Continue Zoloft  200 mg daily.

## 2023-08-13 NOTE — Assessment & Plan Note (Signed)
 Well controlled previously Recheck A1c Assoc with/complicated by HTN, HLD Continue current medications Uacr today On Statin Discussed diet and exercise F/u in 6 months

## 2023-08-13 NOTE — Assessment & Plan Note (Signed)
 Hypertension managed with valsartan  HCTZ and metoprolol . - Continue valsartan  HCTZ 160 mg/25 mg daily. - Continue metoprolol  XL 50 mg daily.

## 2023-08-13 NOTE — Assessment & Plan Note (Signed)
 Stroke with residual balance issues and need for mobility aids. Uses a walker and requires a new wheelchair. Physical and occupational therapy have not been resumed since before Labor Day. Requests a bariatric size transport chair for easier mobility and transport. - Order a transport chair, bariatric size if possible, to be sent to [employer]. - Order physical therapy and occupational therapy to resume at home.

## 2023-08-13 NOTE — Assessment & Plan Note (Signed)
 Chronic atrial fibrillation managed with Eliquis  and metoprolol . - Continue Eliquis  5 mg twice daily. - Continue metoprolol  XL 50 mg daily.

## 2023-08-13 NOTE — Progress Notes (Signed)
 Established patient visit   Patient: Christina Rubio   DOB: 04/24/1950   73 y.o. Female  MRN: [id number] Visit Date: 08/13/2023  Today's healthcare provider: Aden Agreste, MD   Chief Complaint  Patient presents with   Medical Management of Chronic Issues   Diabetes    Patient reports she does monitor avg around 120 mg/dL   Hyperlipidemia   Hypertension    Patient reports she has not monitored recently   Hypothyroidism   Anxiety/Depression    Patient reports things are a little bit better and she still gets a bit anxious    Durable Medical Equipment    Patient reports needing a new wheel chair   Therapy    Reports she has not had PT and OT and thought it was ordered   Subjective    Diabetes  Hyperlipidemia  Hypertension   HPI     Diabetes    Additional comments: Patient reports she does monitor avg around 120 mg/dL        Hypertension    Additional comments: Patient reports she has not monitored recently        Anxiety/Depression    Additional comments: Patient reports things are a little bit better and she still gets a bit anxious         Durable Medical Equipment    Additional comments: Patient reports needing a new wheel chair        Therapy    Additional comments: Reports she has not had PT and OT and thought it was ordered      Last edited by Pasty Bongo, CMA on 08/13/2023  3:18 PM.       Discussed the use of AI scribe software for clinical note transcription with the patient, who gave verbal consent to proceed.  History of Present Illness   Christina Rubio "Christina Rubio" is a 73 year old female with a history of stroke and mobility issues who presents for a follow-up regarding her wheelchair and physical therapy needs.  She is experiencing difficulties with her current wheelchair, which is heavy and uncomfortable for prolonged use. She uses a walker regularly due to poor balance and is adjusting to a new, slightly wider and taller  walker. Her physical therapy sessions have not resumed since before Labor Day, and she wants to have physical and occupational therapy sessions at home again.  She has a recurring rash under her right breast, which is painful and persistent, typically occurring every summer. She uses a Medline cream from rehab, which provides some relief but has not resolved the issue. She avoids wearing a bra to prevent irritation and uses 'anti monkey butt powder' for additional comfort.  She is currently taking Zoloft  200 mg daily for depression and anxiety, and Xanax  0.5 mg as needed, which she has been using more frequently due to increased stress. She also takes Eliquis  5 mg twice daily for atrial fibrillation, Wellbutrin  XL 300 mg and 150 mg daily, atorvastatin  20 mg daily for cholesterol, Synthroid  112 mcg daily for thyroid , metoprolol  XL 50 mg daily, and valsartan  HCTZ 160/25 mg daily for blood pressure.  She receives 60 hours of Medicare home health aid per year, supplemented by a grant providing six hours a week, which will be used when her current aide goes on maternity leave. The aide assists with household tasks and personal care, including showering, which she finds nerve-wracking. She lives alone and is working on regaining independence, including driving. She has a supportive  family, including a niece who visits with her child.         Medications: Outpatient Medications Prior to Visit  Medication Sig   atorvastatin  (LIPITOR) 20 MG tablet TAKE 1 TABLET BY MOUTH DAILY   azelastine  (ASTELIN ) 0.1 % nasal spray Place 1 spray into both nostrils 2 (two) times daily. Use in each nostril as directed   Blood Glucose Monitoring Suppl DEVI 1 each by Does not apply route in the morning, at noon, and at bedtime. May substitute to any manufacturer covered by patient's insurance.   buPROPion  (WELLBUTRIN  XL) 150 MG 24 hr tablet TAKE 1 TABLET BY MOUTH DAILY WITH BREAKFAST   buPROPion  (WELLBUTRIN  XL) 300 MG 24 hr  tablet Take 300 mg by mouth daily.   ELIQUIS  5 MG TABS tablet TAKE 1 TABLET BY MOUTH TWICE DAILY   levothyroxine  (SYNTHROID ) 112 MCG tablet TAKE 1 TABLET EVERY DAY ON EMPTY STOMACHWITH A GLASS OF WATER AT LEAST 30-60 MINBEFORE BREAKFAST   loperamide  (IMODIUM  A-D) 2 MG tablet Take 1 tablet (2 mg total) by mouth 4 (four) times daily as needed for diarrhea or loose stools.   metoprolol  succinate (TOPROL -XL) 50 MG 24 hr tablet TAKE 1 TABLET BY MOUTH DAILY WITH OR IMMEDIATELY FOLLOWING A MEAL   Multiple Vitamins-Minerals (PRESERVISION AREDS 2) CAPS Take 1 tablet by mouth 2 (two) times daily.   oxybutynin  (DITROPAN -XL) 5 MG 24 hr tablet TAKE 1 TABLET BY MOUTH AT BEDTIME.   Potassium 99 MG TABS Take 1 tablet by mouth daily in the afternoon.   Probiotic Product (PROBIOTIC PO) Take by mouth daily.   sertraline  (ZOLOFT ) 100 MG tablet TAKE 2 TABLETS BY MOUTH DAILY WITH BREAKFAST   valsartan -hydrochlorothiazide  (DIOVAN -HCT) 160-25 MG tablet TAKE 1 TABLET BY MOUTH NIGHTLY   [DISCONTINUED] ALPRAZolam  (XANAX ) 0.5 MG tablet TAKE ONE TABLET BY MOUTH EVERY DAY AS NEEDED FOR ANXIETY   No facility-administered medications prior to visit.    Review of Systems     Objective    BP (!) 124/57 (BP Location: Left Arm, Patient Position: Sitting, Cuff Size: Normal)   Pulse (!) 48   Ht 5\' 4"  (1.626 m)   SpO2 100%   BMI 60.59 kg/m    Physical Exam Vitals reviewed.  Constitutional:      General: She is not in acute distress.    Appearance: Normal appearance. She is well-developed. She is not diaphoretic.  HENT:     Head: Normocephalic and atraumatic.  Eyes:     General: No scleral icterus.    Conjunctiva/sclera: Conjunctivae normal.  Neck:     Thyroid : No thyromegaly.  Cardiovascular:     Rate and Rhythm: Normal rate and regular rhythm.     Heart sounds: Normal heart sounds. No murmur heard. Pulmonary:     Effort: Pulmonary effort is normal. No respiratory distress.     Breath sounds: Normal breath  sounds. No wheezing, rhonchi or rales.  Musculoskeletal:     Cervical back: Neck supple.     Right lower leg: Edema present.     Left lower leg: Edema present.  Lymphadenopathy:     Cervical: No cervical adenopathy.  Skin:    General: Skin is warm and dry.     Findings: Rash (erythematous under R breast) present.  Neurological:     Mental Status: She is alert and oriented to person, place, and time. Mental status is at baseline.  Psychiatric:        Mood and Affect: Mood normal.  Behavior: Behavior normal.      No results found for any visits on 08/13/23.  Assessment & Plan     Problem List Items Addressed This Visit       Cardiovascular and Mediastinum   Hypertension associated with diabetes (HCC)   Hypertension managed with valsartan  HCTZ and metoprolol . - Continue valsartan  HCTZ 160 mg/25 mg daily. - Continue metoprolol  XL 50 mg daily.      Relevant Orders   Comprehensive metabolic panel with GFR   Paroxysmal atrial fibrillation (HCC)   Chronic atrial fibrillation managed with Eliquis  and metoprolol . - Continue Eliquis  5 mg twice daily. - Continue metoprolol  XL 50 mg daily.      Relevant Orders   CBC w/Diff/Platelet     Endocrine   Hyperlipidemia associated with type 2 diabetes mellitus (HCC)   Hyperlipidemia managed with atorvastatin . - Continue atorvastatin  20 mg daily.      Relevant Orders   Comprehensive metabolic panel with GFR   Lipid panel   Hypothyroidism due to acquired atrophy of thyroid    Hypothyroidism managed with Synthroid . - Continue Synthroid  112 mcg daily.      Relevant Orders   TSH   T2DM (type 2 diabetes mellitus) (HCC) - Primary   Well controlled previously Recheck A1c Assoc with/complicated by HTN, HLD Continue current medications Uacr today On Statin Discussed diet and exercise F/u in 6 months       Relevant Orders   Urine Microalbumin w/creat. ratio   Hemoglobin A1c     Other   Morbid obesity (HCC) (Chronic)    Discussed importance of healthy weight management Discussed diet and exercise       MDD (major depressive disorder)   Chronic depression and anxiety managed with Zoloft  and Xanax . Increased anxiety noted, requiring more frequent use of Xanax . Experiences good and challenging days, with recent increased stressors. Xanax  is used as needed for potential panic attacks. - Increase Xanax  prescription to 30 pills per month for as-needed use. - Continue Zoloft  200 mg daily.      Relevant Medications   ALPRAZolam  (XANAX ) 0.5 MG tablet   Spinal stenosis   Relevant Orders   DME Wheelchair manual   Ambulatory referral to Home Health   GAD (generalized anxiety disorder)   Chronic depression and anxiety managed with Zoloft  and Xanax . Increased anxiety noted, requiring more frequent use of Xanax . Experiences good and challenging days, with recent increased stressors. Xanax  is used as needed for potential panic attacks. - Increase Xanax  prescription to 30 pills per month for as-needed use. - Continue Zoloft  200 mg daily.      Relevant Medications   ALPRAZolam  (XANAX ) 0.5 MG tablet   Lymphedema   Relevant Orders   DME Wheelchair manual   Ambulatory referral to Home Health   BMI 60.0-69.9, adult [facility])   Discussed importance of healthy weight management Discussed diet and exercise       History of CVA (cerebrovascular accident)   Stroke with residual balance issues and need for mobility aids. Uses a walker and requires a new wheelchair. Physical and occupational therapy have not been resumed since before Labor Day. Requests a bariatric size transport chair for easier mobility and transport. - Order a transport chair, bariatric size if possible, to be sent to [employer]. - Order physical therapy and occupational therapy to resume at home.       Relevant Orders   DME Wheelchair manual   Ambulatory referral to Home Health   Other Visit Diagnoses  Decreased mobility       Relevant Orders    DME Wheelchair manual   Ambulatory referral to Home Health     Candidal intertrigo       Relevant Medications   clotrimazole-betamethasone  (LOTRISONE) cream           Yeast rash under right breast Recurrent yeast rash under the right breast, exacerbated by moisture and friction, currently painful and not responding adequately to treatment. Identified as fungal and yeast rash, requiring antifungal treatment with potential steroid use to reduce inflammation. - Prescribe Lotrisone (clotrimazole and betamethasone ) cream to be applied once daily. - Advise keeping the area dry and using soft microfiber washcloths to manage moisture.      Return in about 6 months (around 02/13/2024) for CPE.      I personally spent a total of 75 minutes in the care of the patient today including preparing to see the patient, getting/reviewing separately obtained history, performing a medically appropriate exam/evaluation, counseling and educating, placing orders, referring and communicating with other health care professionals, documenting clinical information in the EHR, and coordinating care.    Aden Agreste, MD  [facility] [phone] (phone) [phone] (fax)  [facility]

## 2023-08-13 NOTE — Assessment & Plan Note (Signed)
 Hyperlipidemia managed with atorvastatin . - Continue atorvastatin  20 mg daily.

## 2023-08-13 NOTE — Assessment & Plan Note (Signed)
 Hypothyroidism managed with Synthroid . - Continue Synthroid  112 mcg daily.

## 2023-08-14 ENCOUNTER — Ambulatory Visit: Payer: Self-pay | Admitting: Family Medicine

## 2023-08-14 LAB — CBC WITH DIFFERENTIAL/PLATELET
Basophils Absolute: 0.1 10*3/uL (ref 0.0–0.2)
Basos: 1 %
EOS (ABSOLUTE): 0.2 10*3/uL (ref 0.0–0.4)
Eos: 2 %
Hematocrit: 40.1 % (ref 34.0–46.6)
Hemoglobin: 12.9 g/dL (ref 11.1–15.9)
Immature Grans (Abs): 0 10*3/uL (ref 0.0–0.1)
Immature Granulocytes: 0 %
Lymphocytes Absolute: 2.3 10*3/uL (ref 0.7–3.1)
Lymphs: 24 %
MCH: 29.4 pg (ref 26.6–33.0)
MCHC: 32.2 g/dL (ref 31.5–35.7)
MCV: 91 fL (ref 79–97)
Monocytes Absolute: 0.6 10*3/uL (ref 0.1–0.9)
Monocytes: 7 %
Neutrophils Absolute: 6.6 10*3/uL (ref 1.4–7.0)
Neutrophils: 66 %
Platelets: 223 10*3/uL (ref 150–450)
RBC: 4.39 x10E6/uL (ref 3.77–5.28)
RDW: 13.3 % (ref 11.7–15.4)
WBC: 9.8 10*3/uL (ref 3.4–10.8)

## 2023-08-14 LAB — COMPREHENSIVE METABOLIC PANEL WITH GFR
ALT: 13 IU/L (ref 0–32)
AST: 16 IU/L (ref 0–40)
Albumin: 4.2 g/dL (ref 3.8–4.8)
Alkaline Phosphatase: 66 IU/L (ref 44–121)
BUN/Creatinine Ratio: 18 (ref 12–28)
BUN: 19 mg/dL (ref 8–27)
Bilirubin Total: 0.8 mg/dL (ref 0.0–1.2)
CO2: 22 mmol/L (ref 20–29)
Calcium: 9.4 mg/dL (ref 8.7–10.3)
Chloride: 100 mmol/L (ref 96–106)
Creatinine, Ser: 1.06 mg/dL — ABNORMAL HIGH (ref 0.57–1.00)
Globulin, Total: 2.9 g/dL (ref 1.5–4.5)
Glucose: 109 mg/dL — ABNORMAL HIGH (ref 70–99)
Potassium: 3.7 mmol/L (ref 3.5–5.2)
Sodium: 140 mmol/L (ref 134–144)
Total Protein: 7.1 g/dL (ref 6.0–8.5)
eGFR: 56 mL/min/{1.73_m2} — ABNORMAL LOW (ref >59)

## 2023-08-14 LAB — HEMOGLOBIN A1C
Est. average glucose Bld gHb Est-mCnc: 120 mg/dL
Hgb A1c MFr Bld: 5.8 % — ABNORMAL HIGH (ref 4.8–5.6)

## 2023-08-14 LAB — LIPID PANEL
Chol/HDL Ratio: 3.3 ratio (ref 0.0–4.4)
Cholesterol, Total: 173 mg/dL (ref 100–199)
HDL: 52 mg/dL (ref >39)
LDL Chol Calc (NIH): 94 mg/dL (ref 0–99)
Triglycerides: 154 mg/dL — ABNORMAL HIGH (ref 0–149)
VLDL Cholesterol Cal: 27 mg/dL (ref 5–40)

## 2023-08-14 LAB — TSH: TSH: 3.95 u[IU]/mL (ref 0.450–4.500)

## 2023-08-20 DIAGNOSIS — Z8673 Personal history of transient ischemic attack (TIA), and cerebral infarction without residual deficits: Secondary | ICD-10-CM | POA: Diagnosis not present

## 2023-08-20 DIAGNOSIS — I480 Paroxysmal atrial fibrillation: Secondary | ICD-10-CM | POA: Diagnosis not present

## 2023-08-20 DIAGNOSIS — G5601 Carpal tunnel syndrome, right upper limb: Secondary | ICD-10-CM | POA: Diagnosis not present

## 2023-08-22 DIAGNOSIS — F329 Major depressive disorder, single episode, unspecified: Secondary | ICD-10-CM | POA: Diagnosis not present

## 2023-08-22 DIAGNOSIS — Z79899 Other long term (current) drug therapy: Secondary | ICD-10-CM | POA: Diagnosis not present

## 2023-08-22 DIAGNOSIS — B372 Candidiasis of skin and nail: Secondary | ICD-10-CM | POA: Diagnosis not present

## 2023-08-22 DIAGNOSIS — Z6841 Body Mass Index (BMI) 40.0 and over, adult: Secondary | ICD-10-CM | POA: Diagnosis not present

## 2023-08-22 DIAGNOSIS — E034 Atrophy of thyroid (acquired): Secondary | ICD-10-CM | POA: Diagnosis not present

## 2023-08-22 DIAGNOSIS — E785 Hyperlipidemia, unspecified: Secondary | ICD-10-CM | POA: Diagnosis not present

## 2023-08-22 DIAGNOSIS — E1169 Type 2 diabetes mellitus with other specified complication: Secondary | ICD-10-CM | POA: Diagnosis not present

## 2023-08-22 DIAGNOSIS — E1159 Type 2 diabetes mellitus with other circulatory complications: Secondary | ICD-10-CM | POA: Diagnosis not present

## 2023-08-22 DIAGNOSIS — I152 Hypertension secondary to endocrine disorders: Secondary | ICD-10-CM | POA: Diagnosis not present

## 2023-08-22 DIAGNOSIS — I890 Lymphedema, not elsewhere classified: Secondary | ICD-10-CM | POA: Diagnosis not present

## 2023-08-22 DIAGNOSIS — I69398 Other sequelae of cerebral infarction: Secondary | ICD-10-CM | POA: Diagnosis not present

## 2023-08-22 DIAGNOSIS — R260 Ataxic gait: Secondary | ICD-10-CM | POA: Diagnosis not present

## 2023-08-22 DIAGNOSIS — F411 Generalized anxiety disorder: Secondary | ICD-10-CM | POA: Diagnosis not present

## 2023-08-22 DIAGNOSIS — Z7901 Long term (current) use of anticoagulants: Secondary | ICD-10-CM | POA: Diagnosis not present

## 2023-08-22 DIAGNOSIS — Z9181 History of falling: Secondary | ICD-10-CM | POA: Diagnosis not present

## 2023-08-22 DIAGNOSIS — I480 Paroxysmal atrial fibrillation: Secondary | ICD-10-CM | POA: Diagnosis not present

## 2023-08-22 DIAGNOSIS — R32 Unspecified urinary incontinence: Secondary | ICD-10-CM | POA: Diagnosis not present

## 2023-08-22 DIAGNOSIS — E038 Other specified hypothyroidism: Secondary | ICD-10-CM | POA: Diagnosis not present

## 2023-08-22 DIAGNOSIS — M4800 Spinal stenosis, site unspecified: Secondary | ICD-10-CM | POA: Diagnosis not present

## 2023-08-22 DIAGNOSIS — Z604 Social exclusion and rejection: Secondary | ICD-10-CM | POA: Diagnosis not present

## 2023-08-24 ENCOUNTER — Telehealth: Payer: Self-pay

## 2023-08-24 NOTE — Telephone Encounter (Signed)
 Form was received through fax per Peterson Brandt and put in Dr. Henretta Lodge box for completion   Copied from CRM [id number]. Topic: Clinical - Order For Equipment >> Aug 24, 2023  2:43 PM Fredrica W wrote: Reason for CRM: [facility] [employer] called to see if fax was received and check status. Sent 5/23. Not showing in Media. Will refax. Thank You

## 2023-08-24 NOTE — Telephone Encounter (Signed)
Verbals given  

## 2023-08-24 NOTE — Telephone Encounter (Signed)
 That's fine

## 2023-08-24 NOTE — Telephone Encounter (Signed)
 Copied from CRM [id number]. Topic: Clinical - Home Health Verbal Orders >> Aug 24, 2023 11:04 AM Hassie Lint wrote: Caller/Agency: [facility] Callback Number: [phone] Service Requested: Physical Therapy Frequency: 1 time a week for 7 weeks. Any new concerns about the patient? No

## 2023-08-25 DIAGNOSIS — E785 Hyperlipidemia, unspecified: Secondary | ICD-10-CM | POA: Diagnosis not present

## 2023-08-25 DIAGNOSIS — F329 Major depressive disorder, single episode, unspecified: Secondary | ICD-10-CM | POA: Diagnosis not present

## 2023-08-25 DIAGNOSIS — M4800 Spinal stenosis, site unspecified: Secondary | ICD-10-CM | POA: Diagnosis not present

## 2023-08-25 DIAGNOSIS — E1159 Type 2 diabetes mellitus with other circulatory complications: Secondary | ICD-10-CM | POA: Diagnosis not present

## 2023-08-25 DIAGNOSIS — I480 Paroxysmal atrial fibrillation: Secondary | ICD-10-CM | POA: Diagnosis not present

## 2023-08-25 DIAGNOSIS — Z604 Social exclusion and rejection: Secondary | ICD-10-CM | POA: Diagnosis not present

## 2023-08-25 DIAGNOSIS — Z7901 Long term (current) use of anticoagulants: Secondary | ICD-10-CM | POA: Diagnosis not present

## 2023-08-25 DIAGNOSIS — E034 Atrophy of thyroid (acquired): Secondary | ICD-10-CM | POA: Diagnosis not present

## 2023-08-25 DIAGNOSIS — E1169 Type 2 diabetes mellitus with other specified complication: Secondary | ICD-10-CM | POA: Diagnosis not present

## 2023-08-25 DIAGNOSIS — R260 Ataxic gait: Secondary | ICD-10-CM | POA: Diagnosis not present

## 2023-08-25 DIAGNOSIS — I890 Lymphedema, not elsewhere classified: Secondary | ICD-10-CM | POA: Diagnosis not present

## 2023-08-25 DIAGNOSIS — E038 Other specified hypothyroidism: Secondary | ICD-10-CM | POA: Diagnosis not present

## 2023-08-25 DIAGNOSIS — I69398 Other sequelae of cerebral infarction: Secondary | ICD-10-CM | POA: Diagnosis not present

## 2023-08-25 DIAGNOSIS — I152 Hypertension secondary to endocrine disorders: Secondary | ICD-10-CM | POA: Diagnosis not present

## 2023-08-25 DIAGNOSIS — Z9181 History of falling: Secondary | ICD-10-CM | POA: Diagnosis not present

## 2023-08-25 DIAGNOSIS — Z79899 Other long term (current) drug therapy: Secondary | ICD-10-CM | POA: Diagnosis not present

## 2023-08-25 DIAGNOSIS — F411 Generalized anxiety disorder: Secondary | ICD-10-CM | POA: Diagnosis not present

## 2023-08-25 DIAGNOSIS — R32 Unspecified urinary incontinence: Secondary | ICD-10-CM | POA: Diagnosis not present

## 2023-08-25 DIAGNOSIS — Z6841 Body Mass Index (BMI) 40.0 and over, adult: Secondary | ICD-10-CM | POA: Diagnosis not present

## 2023-08-25 DIAGNOSIS — B372 Candidiasis of skin and nail: Secondary | ICD-10-CM | POA: Diagnosis not present

## 2023-08-27 ENCOUNTER — Other Ambulatory Visit: Payer: Self-pay | Admitting: Family Medicine

## 2023-08-27 DIAGNOSIS — F418 Other specified anxiety disorders: Secondary | ICD-10-CM

## 2023-08-27 NOTE — Telephone Encounter (Signed)
 Reviewed

## 2023-08-28 DIAGNOSIS — E1169 Type 2 diabetes mellitus with other specified complication: Secondary | ICD-10-CM | POA: Diagnosis not present

## 2023-08-28 DIAGNOSIS — Z6841 Body Mass Index (BMI) 40.0 and over, adult: Secondary | ICD-10-CM | POA: Diagnosis not present

## 2023-08-28 DIAGNOSIS — B372 Candidiasis of skin and nail: Secondary | ICD-10-CM | POA: Diagnosis not present

## 2023-08-28 DIAGNOSIS — Z604 Social exclusion and rejection: Secondary | ICD-10-CM | POA: Diagnosis not present

## 2023-08-28 DIAGNOSIS — E038 Other specified hypothyroidism: Secondary | ICD-10-CM | POA: Diagnosis not present

## 2023-08-28 DIAGNOSIS — I480 Paroxysmal atrial fibrillation: Secondary | ICD-10-CM | POA: Diagnosis not present

## 2023-08-28 DIAGNOSIS — Z79899 Other long term (current) drug therapy: Secondary | ICD-10-CM | POA: Diagnosis not present

## 2023-08-28 DIAGNOSIS — M4800 Spinal stenosis, site unspecified: Secondary | ICD-10-CM | POA: Diagnosis not present

## 2023-08-28 DIAGNOSIS — I152 Hypertension secondary to endocrine disorders: Secondary | ICD-10-CM | POA: Diagnosis not present

## 2023-08-28 DIAGNOSIS — F411 Generalized anxiety disorder: Secondary | ICD-10-CM | POA: Diagnosis not present

## 2023-08-28 DIAGNOSIS — I890 Lymphedema, not elsewhere classified: Secondary | ICD-10-CM | POA: Diagnosis not present

## 2023-08-28 DIAGNOSIS — F329 Major depressive disorder, single episode, unspecified: Secondary | ICD-10-CM | POA: Diagnosis not present

## 2023-08-28 DIAGNOSIS — R260 Ataxic gait: Secondary | ICD-10-CM | POA: Diagnosis not present

## 2023-08-28 DIAGNOSIS — I69398 Other sequelae of cerebral infarction: Secondary | ICD-10-CM | POA: Diagnosis not present

## 2023-08-28 DIAGNOSIS — R32 Unspecified urinary incontinence: Secondary | ICD-10-CM | POA: Diagnosis not present

## 2023-08-28 DIAGNOSIS — Z9181 History of falling: Secondary | ICD-10-CM | POA: Diagnosis not present

## 2023-08-28 DIAGNOSIS — Z7901 Long term (current) use of anticoagulants: Secondary | ICD-10-CM | POA: Diagnosis not present

## 2023-08-28 DIAGNOSIS — E785 Hyperlipidemia, unspecified: Secondary | ICD-10-CM | POA: Diagnosis not present

## 2023-08-28 DIAGNOSIS — E1159 Type 2 diabetes mellitus with other circulatory complications: Secondary | ICD-10-CM | POA: Diagnosis not present

## 2023-08-28 DIAGNOSIS — E034 Atrophy of thyroid (acquired): Secondary | ICD-10-CM | POA: Diagnosis not present

## 2023-09-02 DIAGNOSIS — M4800 Spinal stenosis, site unspecified: Secondary | ICD-10-CM | POA: Diagnosis not present

## 2023-09-02 DIAGNOSIS — R260 Ataxic gait: Secondary | ICD-10-CM | POA: Diagnosis not present

## 2023-09-02 DIAGNOSIS — Z9181 History of falling: Secondary | ICD-10-CM | POA: Diagnosis not present

## 2023-09-02 DIAGNOSIS — F329 Major depressive disorder, single episode, unspecified: Secondary | ICD-10-CM | POA: Diagnosis not present

## 2023-09-02 DIAGNOSIS — E034 Atrophy of thyroid (acquired): Secondary | ICD-10-CM | POA: Diagnosis not present

## 2023-09-02 DIAGNOSIS — F411 Generalized anxiety disorder: Secondary | ICD-10-CM | POA: Diagnosis not present

## 2023-09-02 DIAGNOSIS — R32 Unspecified urinary incontinence: Secondary | ICD-10-CM | POA: Diagnosis not present

## 2023-09-02 DIAGNOSIS — E1169 Type 2 diabetes mellitus with other specified complication: Secondary | ICD-10-CM | POA: Diagnosis not present

## 2023-09-02 DIAGNOSIS — I480 Paroxysmal atrial fibrillation: Secondary | ICD-10-CM | POA: Diagnosis not present

## 2023-09-02 DIAGNOSIS — I890 Lymphedema, not elsewhere classified: Secondary | ICD-10-CM | POA: Diagnosis not present

## 2023-09-02 DIAGNOSIS — E1159 Type 2 diabetes mellitus with other circulatory complications: Secondary | ICD-10-CM | POA: Diagnosis not present

## 2023-09-02 DIAGNOSIS — Z604 Social exclusion and rejection: Secondary | ICD-10-CM | POA: Diagnosis not present

## 2023-09-02 DIAGNOSIS — B372 Candidiasis of skin and nail: Secondary | ICD-10-CM | POA: Diagnosis not present

## 2023-09-02 DIAGNOSIS — E038 Other specified hypothyroidism: Secondary | ICD-10-CM | POA: Diagnosis not present

## 2023-09-02 DIAGNOSIS — Z6841 Body Mass Index (BMI) 40.0 and over, adult: Secondary | ICD-10-CM | POA: Diagnosis not present

## 2023-09-02 DIAGNOSIS — Z79899 Other long term (current) drug therapy: Secondary | ICD-10-CM | POA: Diagnosis not present

## 2023-09-02 DIAGNOSIS — I69398 Other sequelae of cerebral infarction: Secondary | ICD-10-CM | POA: Diagnosis not present

## 2023-09-02 DIAGNOSIS — E785 Hyperlipidemia, unspecified: Secondary | ICD-10-CM | POA: Diagnosis not present

## 2023-09-02 DIAGNOSIS — Z7901 Long term (current) use of anticoagulants: Secondary | ICD-10-CM | POA: Diagnosis not present

## 2023-09-02 DIAGNOSIS — I152 Hypertension secondary to endocrine disorders: Secondary | ICD-10-CM | POA: Diagnosis not present

## 2023-09-08 DIAGNOSIS — Z6841 Body Mass Index (BMI) 40.0 and over, adult: Secondary | ICD-10-CM | POA: Diagnosis not present

## 2023-09-08 DIAGNOSIS — Z7901 Long term (current) use of anticoagulants: Secondary | ICD-10-CM | POA: Diagnosis not present

## 2023-09-08 DIAGNOSIS — I152 Hypertension secondary to endocrine disorders: Secondary | ICD-10-CM | POA: Diagnosis not present

## 2023-09-08 DIAGNOSIS — E038 Other specified hypothyroidism: Secondary | ICD-10-CM | POA: Diagnosis not present

## 2023-09-08 DIAGNOSIS — Z604 Social exclusion and rejection: Secondary | ICD-10-CM | POA: Diagnosis not present

## 2023-09-08 DIAGNOSIS — R260 Ataxic gait: Secondary | ICD-10-CM | POA: Diagnosis not present

## 2023-09-08 DIAGNOSIS — E1169 Type 2 diabetes mellitus with other specified complication: Secondary | ICD-10-CM | POA: Diagnosis not present

## 2023-09-08 DIAGNOSIS — F411 Generalized anxiety disorder: Secondary | ICD-10-CM | POA: Diagnosis not present

## 2023-09-08 DIAGNOSIS — E785 Hyperlipidemia, unspecified: Secondary | ICD-10-CM | POA: Diagnosis not present

## 2023-09-08 DIAGNOSIS — F329 Major depressive disorder, single episode, unspecified: Secondary | ICD-10-CM | POA: Diagnosis not present

## 2023-09-08 DIAGNOSIS — M4800 Spinal stenosis, site unspecified: Secondary | ICD-10-CM | POA: Diagnosis not present

## 2023-09-08 DIAGNOSIS — R32 Unspecified urinary incontinence: Secondary | ICD-10-CM | POA: Diagnosis not present

## 2023-09-08 DIAGNOSIS — Z9181 History of falling: Secondary | ICD-10-CM | POA: Diagnosis not present

## 2023-09-08 DIAGNOSIS — B372 Candidiasis of skin and nail: Secondary | ICD-10-CM | POA: Diagnosis not present

## 2023-09-08 DIAGNOSIS — I480 Paroxysmal atrial fibrillation: Secondary | ICD-10-CM | POA: Diagnosis not present

## 2023-09-08 DIAGNOSIS — E034 Atrophy of thyroid (acquired): Secondary | ICD-10-CM | POA: Diagnosis not present

## 2023-09-08 DIAGNOSIS — I69398 Other sequelae of cerebral infarction: Secondary | ICD-10-CM | POA: Diagnosis not present

## 2023-09-08 DIAGNOSIS — E1159 Type 2 diabetes mellitus with other circulatory complications: Secondary | ICD-10-CM | POA: Diagnosis not present

## 2023-09-08 DIAGNOSIS — Z79899 Other long term (current) drug therapy: Secondary | ICD-10-CM | POA: Diagnosis not present

## 2023-09-08 DIAGNOSIS — I890 Lymphedema, not elsewhere classified: Secondary | ICD-10-CM | POA: Diagnosis not present

## 2023-09-10 ENCOUNTER — Telehealth: Payer: Self-pay

## 2023-09-10 NOTE — Telephone Encounter (Signed)
 Copied from CRM [id number]. Topic: Clinical - Prescription Issue >> Sep 10, 2023 12:30 PM Felizardo Hotter wrote: Reason for CRM: Pt wants to know dosage of potassium supplement? Please call pt at  [phone].

## 2023-09-11 NOTE — Telephone Encounter (Signed)
 LMTCB

## 2023-09-11 NOTE — Telephone Encounter (Signed)
 Patient reports not sure on the dosage. Stated that she started taking it because most likely you recommended it.

## 2023-09-11 NOTE — Telephone Encounter (Signed)
 Need to know what dose she has been taking. Since K was normal on last check, we should keep her on same dose. Typical dose is KDUR 20 mEq daily

## 2023-09-11 NOTE — Telephone Encounter (Signed)
 So she was or wasn't taking potassium when we checked her last labs?

## 2023-09-14 ENCOUNTER — Telehealth: Payer: Self-pay

## 2023-09-14 DIAGNOSIS — E038 Other specified hypothyroidism: Secondary | ICD-10-CM | POA: Diagnosis not present

## 2023-09-14 DIAGNOSIS — F329 Major depressive disorder, single episode, unspecified: Secondary | ICD-10-CM | POA: Diagnosis not present

## 2023-09-14 DIAGNOSIS — E034 Atrophy of thyroid (acquired): Secondary | ICD-10-CM | POA: Diagnosis not present

## 2023-09-14 DIAGNOSIS — Z6841 Body Mass Index (BMI) 40.0 and over, adult: Secondary | ICD-10-CM | POA: Diagnosis not present

## 2023-09-14 DIAGNOSIS — E1159 Type 2 diabetes mellitus with other circulatory complications: Secondary | ICD-10-CM | POA: Diagnosis not present

## 2023-09-14 DIAGNOSIS — M4800 Spinal stenosis, site unspecified: Secondary | ICD-10-CM | POA: Diagnosis not present

## 2023-09-14 DIAGNOSIS — I152 Hypertension secondary to endocrine disorders: Secondary | ICD-10-CM | POA: Diagnosis not present

## 2023-09-14 DIAGNOSIS — I69398 Other sequelae of cerebral infarction: Secondary | ICD-10-CM | POA: Diagnosis not present

## 2023-09-14 DIAGNOSIS — Z9181 History of falling: Secondary | ICD-10-CM | POA: Diagnosis not present

## 2023-09-14 DIAGNOSIS — Z7901 Long term (current) use of anticoagulants: Secondary | ICD-10-CM | POA: Diagnosis not present

## 2023-09-14 DIAGNOSIS — Z604 Social exclusion and rejection: Secondary | ICD-10-CM | POA: Diagnosis not present

## 2023-09-14 DIAGNOSIS — E1169 Type 2 diabetes mellitus with other specified complication: Secondary | ICD-10-CM | POA: Diagnosis not present

## 2023-09-14 DIAGNOSIS — I890 Lymphedema, not elsewhere classified: Secondary | ICD-10-CM | POA: Diagnosis not present

## 2023-09-14 DIAGNOSIS — B372 Candidiasis of skin and nail: Secondary | ICD-10-CM | POA: Diagnosis not present

## 2023-09-14 DIAGNOSIS — F411 Generalized anxiety disorder: Secondary | ICD-10-CM | POA: Diagnosis not present

## 2023-09-14 DIAGNOSIS — Z79899 Other long term (current) drug therapy: Secondary | ICD-10-CM | POA: Diagnosis not present

## 2023-09-14 DIAGNOSIS — E785 Hyperlipidemia, unspecified: Secondary | ICD-10-CM | POA: Diagnosis not present

## 2023-09-14 DIAGNOSIS — I480 Paroxysmal atrial fibrillation: Secondary | ICD-10-CM | POA: Diagnosis not present

## 2023-09-14 DIAGNOSIS — R260 Ataxic gait: Secondary | ICD-10-CM | POA: Diagnosis not present

## 2023-09-14 DIAGNOSIS — R32 Unspecified urinary incontinence: Secondary | ICD-10-CM | POA: Diagnosis not present

## 2023-09-14 NOTE — Telephone Encounter (Signed)
 Patient advised  Order was faxed to [facility] on 09/09/23    Copied from CRM #[id number]. Topic: General - Other >> Sep 10, 2023  4:42 PM Drema MATSU wrote: Reason for CRM: Patient stated that her doctor placed an order for her to get a wheel chair and no one has contacted her yet. Patient also wants to know who she is ordering it through.

## 2023-09-15 ENCOUNTER — Other Ambulatory Visit: Payer: Self-pay

## 2023-09-15 MED ORDER — POTASSIUM CHLORIDE CRYS ER 20 MEQ PO TBCR: 20.0000 meq | EXTENDED_RELEASE_TABLET | Freq: Every day | ORAL | 1 refills | Status: AC

## 2023-09-15 NOTE — Telephone Encounter (Signed)
 Please send Rx for KDur 20 meq daily (#30 r1).  We will recheck potassium (BMP) in 2 weeks to ensure this dosing is appropriate

## 2023-09-18 ENCOUNTER — Telehealth: Payer: Self-pay | Admitting: Family Medicine

## 2023-09-18 DIAGNOSIS — I152 Hypertension secondary to endocrine disorders: Secondary | ICD-10-CM | POA: Diagnosis not present

## 2023-09-18 DIAGNOSIS — E038 Other specified hypothyroidism: Secondary | ICD-10-CM | POA: Diagnosis not present

## 2023-09-18 DIAGNOSIS — Z604 Social exclusion and rejection: Secondary | ICD-10-CM | POA: Diagnosis not present

## 2023-09-18 DIAGNOSIS — B372 Candidiasis of skin and nail: Secondary | ICD-10-CM | POA: Diagnosis not present

## 2023-09-18 DIAGNOSIS — M4800 Spinal stenosis, site unspecified: Secondary | ICD-10-CM | POA: Diagnosis not present

## 2023-09-18 DIAGNOSIS — E1169 Type 2 diabetes mellitus with other specified complication: Secondary | ICD-10-CM | POA: Diagnosis not present

## 2023-09-18 DIAGNOSIS — I69398 Other sequelae of cerebral infarction: Secondary | ICD-10-CM | POA: Diagnosis not present

## 2023-09-18 DIAGNOSIS — Z6841 Body Mass Index (BMI) 40.0 and over, adult: Secondary | ICD-10-CM | POA: Diagnosis not present

## 2023-09-18 DIAGNOSIS — Z7901 Long term (current) use of anticoagulants: Secondary | ICD-10-CM | POA: Diagnosis not present

## 2023-09-18 DIAGNOSIS — Z79899 Other long term (current) drug therapy: Secondary | ICD-10-CM | POA: Diagnosis not present

## 2023-09-18 DIAGNOSIS — R32 Unspecified urinary incontinence: Secondary | ICD-10-CM | POA: Diagnosis not present

## 2023-09-18 DIAGNOSIS — E785 Hyperlipidemia, unspecified: Secondary | ICD-10-CM | POA: Diagnosis not present

## 2023-09-18 DIAGNOSIS — I480 Paroxysmal atrial fibrillation: Secondary | ICD-10-CM | POA: Diagnosis not present

## 2023-09-18 DIAGNOSIS — I890 Lymphedema, not elsewhere classified: Secondary | ICD-10-CM | POA: Diagnosis not present

## 2023-09-18 DIAGNOSIS — R260 Ataxic gait: Secondary | ICD-10-CM | POA: Diagnosis not present

## 2023-09-18 DIAGNOSIS — F329 Major depressive disorder, single episode, unspecified: Secondary | ICD-10-CM | POA: Diagnosis not present

## 2023-09-18 DIAGNOSIS — E034 Atrophy of thyroid (acquired): Secondary | ICD-10-CM | POA: Diagnosis not present

## 2023-09-18 DIAGNOSIS — F411 Generalized anxiety disorder: Secondary | ICD-10-CM | POA: Diagnosis not present

## 2023-09-18 DIAGNOSIS — E1159 Type 2 diabetes mellitus with other circulatory complications: Secondary | ICD-10-CM | POA: Diagnosis not present

## 2023-09-18 DIAGNOSIS — Z9181 History of falling: Secondary | ICD-10-CM | POA: Diagnosis not present

## 2023-09-18 NOTE — Telephone Encounter (Signed)
 [facility] faxed [facility] certification paperwork to be completed it has been placed in provider mailbox

## 2023-09-21 DIAGNOSIS — R260 Ataxic gait: Secondary | ICD-10-CM | POA: Diagnosis not present

## 2023-09-21 DIAGNOSIS — F411 Generalized anxiety disorder: Secondary | ICD-10-CM | POA: Diagnosis not present

## 2023-09-21 DIAGNOSIS — I480 Paroxysmal atrial fibrillation: Secondary | ICD-10-CM | POA: Diagnosis not present

## 2023-09-21 DIAGNOSIS — E1169 Type 2 diabetes mellitus with other specified complication: Secondary | ICD-10-CM | POA: Diagnosis not present

## 2023-09-21 DIAGNOSIS — B372 Candidiasis of skin and nail: Secondary | ICD-10-CM | POA: Diagnosis not present

## 2023-09-21 DIAGNOSIS — F329 Major depressive disorder, single episode, unspecified: Secondary | ICD-10-CM | POA: Diagnosis not present

## 2023-09-21 DIAGNOSIS — R32 Unspecified urinary incontinence: Secondary | ICD-10-CM | POA: Diagnosis not present

## 2023-09-21 DIAGNOSIS — Z6841 Body Mass Index (BMI) 40.0 and over, adult: Secondary | ICD-10-CM | POA: Diagnosis not present

## 2023-09-21 DIAGNOSIS — E038 Other specified hypothyroidism: Secondary | ICD-10-CM | POA: Diagnosis not present

## 2023-09-21 DIAGNOSIS — I890 Lymphedema, not elsewhere classified: Secondary | ICD-10-CM | POA: Diagnosis not present

## 2023-09-21 DIAGNOSIS — E034 Atrophy of thyroid (acquired): Secondary | ICD-10-CM | POA: Diagnosis not present

## 2023-09-21 DIAGNOSIS — E1159 Type 2 diabetes mellitus with other circulatory complications: Secondary | ICD-10-CM | POA: Diagnosis not present

## 2023-09-21 DIAGNOSIS — I152 Hypertension secondary to endocrine disorders: Secondary | ICD-10-CM | POA: Diagnosis not present

## 2023-09-21 DIAGNOSIS — Z9181 History of falling: Secondary | ICD-10-CM | POA: Diagnosis not present

## 2023-09-21 DIAGNOSIS — E785 Hyperlipidemia, unspecified: Secondary | ICD-10-CM | POA: Diagnosis not present

## 2023-09-21 DIAGNOSIS — Z604 Social exclusion and rejection: Secondary | ICD-10-CM | POA: Diagnosis not present

## 2023-09-21 DIAGNOSIS — Z79899 Other long term (current) drug therapy: Secondary | ICD-10-CM | POA: Diagnosis not present

## 2023-09-21 DIAGNOSIS — M4800 Spinal stenosis, site unspecified: Secondary | ICD-10-CM | POA: Diagnosis not present

## 2023-09-21 DIAGNOSIS — Z7901 Long term (current) use of anticoagulants: Secondary | ICD-10-CM | POA: Diagnosis not present

## 2023-09-21 DIAGNOSIS — I69398 Other sequelae of cerebral infarction: Secondary | ICD-10-CM | POA: Diagnosis not present

## 2023-09-23 DIAGNOSIS — Z604 Social exclusion and rejection: Secondary | ICD-10-CM | POA: Diagnosis not present

## 2023-09-23 DIAGNOSIS — F411 Generalized anxiety disorder: Secondary | ICD-10-CM | POA: Diagnosis not present

## 2023-09-23 DIAGNOSIS — Z6841 Body Mass Index (BMI) 40.0 and over, adult: Secondary | ICD-10-CM | POA: Diagnosis not present

## 2023-09-23 DIAGNOSIS — Z7901 Long term (current) use of anticoagulants: Secondary | ICD-10-CM | POA: Diagnosis not present

## 2023-09-23 DIAGNOSIS — E034 Atrophy of thyroid (acquired): Secondary | ICD-10-CM | POA: Diagnosis not present

## 2023-09-23 DIAGNOSIS — E038 Other specified hypothyroidism: Secondary | ICD-10-CM | POA: Diagnosis not present

## 2023-09-23 DIAGNOSIS — I890 Lymphedema, not elsewhere classified: Secondary | ICD-10-CM | POA: Diagnosis not present

## 2023-09-23 DIAGNOSIS — Z9181 History of falling: Secondary | ICD-10-CM | POA: Diagnosis not present

## 2023-09-23 DIAGNOSIS — I152 Hypertension secondary to endocrine disorders: Secondary | ICD-10-CM | POA: Diagnosis not present

## 2023-09-23 DIAGNOSIS — R32 Unspecified urinary incontinence: Secondary | ICD-10-CM | POA: Diagnosis not present

## 2023-09-23 DIAGNOSIS — E1169 Type 2 diabetes mellitus with other specified complication: Secondary | ICD-10-CM | POA: Diagnosis not present

## 2023-09-23 DIAGNOSIS — Z79899 Other long term (current) drug therapy: Secondary | ICD-10-CM | POA: Diagnosis not present

## 2023-09-23 DIAGNOSIS — E785 Hyperlipidemia, unspecified: Secondary | ICD-10-CM | POA: Diagnosis not present

## 2023-09-23 DIAGNOSIS — M4800 Spinal stenosis, site unspecified: Secondary | ICD-10-CM | POA: Diagnosis not present

## 2023-09-23 DIAGNOSIS — I480 Paroxysmal atrial fibrillation: Secondary | ICD-10-CM | POA: Diagnosis not present

## 2023-09-23 DIAGNOSIS — R260 Ataxic gait: Secondary | ICD-10-CM | POA: Diagnosis not present

## 2023-09-23 DIAGNOSIS — B372 Candidiasis of skin and nail: Secondary | ICD-10-CM | POA: Diagnosis not present

## 2023-09-23 DIAGNOSIS — E1159 Type 2 diabetes mellitus with other circulatory complications: Secondary | ICD-10-CM | POA: Diagnosis not present

## 2023-09-23 DIAGNOSIS — I69398 Other sequelae of cerebral infarction: Secondary | ICD-10-CM | POA: Diagnosis not present

## 2023-09-23 DIAGNOSIS — F329 Major depressive disorder, single episode, unspecified: Secondary | ICD-10-CM | POA: Diagnosis not present

## 2023-09-24 ENCOUNTER — Telehealth: Payer: Self-pay

## 2023-09-24 DIAGNOSIS — F329 Major depressive disorder, single episode, unspecified: Secondary | ICD-10-CM | POA: Diagnosis not present

## 2023-09-24 DIAGNOSIS — Z7901 Long term (current) use of anticoagulants: Secondary | ICD-10-CM | POA: Diagnosis not present

## 2023-09-24 DIAGNOSIS — E038 Other specified hypothyroidism: Secondary | ICD-10-CM | POA: Diagnosis not present

## 2023-09-24 DIAGNOSIS — B372 Candidiasis of skin and nail: Secondary | ICD-10-CM | POA: Diagnosis not present

## 2023-09-24 DIAGNOSIS — I480 Paroxysmal atrial fibrillation: Secondary | ICD-10-CM | POA: Diagnosis not present

## 2023-09-24 DIAGNOSIS — I69398 Other sequelae of cerebral infarction: Secondary | ICD-10-CM | POA: Diagnosis not present

## 2023-09-24 DIAGNOSIS — Z79899 Other long term (current) drug therapy: Secondary | ICD-10-CM | POA: Diagnosis not present

## 2023-09-24 DIAGNOSIS — I152 Hypertension secondary to endocrine disorders: Secondary | ICD-10-CM | POA: Diagnosis not present

## 2023-09-24 DIAGNOSIS — E034 Atrophy of thyroid (acquired): Secondary | ICD-10-CM | POA: Diagnosis not present

## 2023-09-24 DIAGNOSIS — Z9181 History of falling: Secondary | ICD-10-CM | POA: Diagnosis not present

## 2023-09-24 DIAGNOSIS — R260 Ataxic gait: Secondary | ICD-10-CM | POA: Diagnosis not present

## 2023-09-24 DIAGNOSIS — E1159 Type 2 diabetes mellitus with other circulatory complications: Secondary | ICD-10-CM | POA: Diagnosis not present

## 2023-09-24 DIAGNOSIS — E1169 Type 2 diabetes mellitus with other specified complication: Secondary | ICD-10-CM | POA: Diagnosis not present

## 2023-09-24 DIAGNOSIS — M4800 Spinal stenosis, site unspecified: Secondary | ICD-10-CM | POA: Diagnosis not present

## 2023-09-24 DIAGNOSIS — Z604 Social exclusion and rejection: Secondary | ICD-10-CM | POA: Diagnosis not present

## 2023-09-24 DIAGNOSIS — R32 Unspecified urinary incontinence: Secondary | ICD-10-CM | POA: Diagnosis not present

## 2023-09-24 DIAGNOSIS — E785 Hyperlipidemia, unspecified: Secondary | ICD-10-CM | POA: Diagnosis not present

## 2023-09-24 DIAGNOSIS — F411 Generalized anxiety disorder: Secondary | ICD-10-CM | POA: Diagnosis not present

## 2023-09-24 DIAGNOSIS — Z6841 Body Mass Index (BMI) 40.0 and over, adult: Secondary | ICD-10-CM | POA: Diagnosis not present

## 2023-09-24 DIAGNOSIS — I890 Lymphedema, not elsewhere classified: Secondary | ICD-10-CM | POA: Diagnosis not present

## 2023-09-24 NOTE — Telephone Encounter (Signed)
 Copied from CRM [id number]. Topic: General - Other >> Sep 24, 2023 11:42 AM Christina Rubio wrote: Reason for CRM: Patient is doing an at home urine sample but lost the bag with her information(name /dob) it. Patient wants to know if she can get a new bag and where does she need to drop off the sample. Patient is requesting a call back at [phone]

## 2023-09-24 NOTE — Telephone Encounter (Signed)
 Patient advised she would come into the door directly across from the elevator. Also advised if she is having trouble with transportation bringing the specimen it can be collected at her next visit. She verbalized understanding. Order printed and placed at front desk

## 2023-09-28 DIAGNOSIS — F411 Generalized anxiety disorder: Secondary | ICD-10-CM | POA: Diagnosis not present

## 2023-09-28 DIAGNOSIS — I152 Hypertension secondary to endocrine disorders: Secondary | ICD-10-CM | POA: Diagnosis not present

## 2023-09-28 DIAGNOSIS — I890 Lymphedema, not elsewhere classified: Secondary | ICD-10-CM | POA: Diagnosis not present

## 2023-09-28 DIAGNOSIS — B372 Candidiasis of skin and nail: Secondary | ICD-10-CM | POA: Diagnosis not present

## 2023-09-28 DIAGNOSIS — F329 Major depressive disorder, single episode, unspecified: Secondary | ICD-10-CM | POA: Diagnosis not present

## 2023-09-28 DIAGNOSIS — I480 Paroxysmal atrial fibrillation: Secondary | ICD-10-CM | POA: Diagnosis not present

## 2023-09-28 DIAGNOSIS — E038 Other specified hypothyroidism: Secondary | ICD-10-CM | POA: Diagnosis not present

## 2023-09-28 DIAGNOSIS — Z9181 History of falling: Secondary | ICD-10-CM | POA: Diagnosis not present

## 2023-09-28 DIAGNOSIS — Z7901 Long term (current) use of anticoagulants: Secondary | ICD-10-CM | POA: Diagnosis not present

## 2023-09-28 DIAGNOSIS — R32 Unspecified urinary incontinence: Secondary | ICD-10-CM | POA: Diagnosis not present

## 2023-09-28 DIAGNOSIS — E1169 Type 2 diabetes mellitus with other specified complication: Secondary | ICD-10-CM | POA: Diagnosis not present

## 2023-09-28 DIAGNOSIS — E1159 Type 2 diabetes mellitus with other circulatory complications: Secondary | ICD-10-CM | POA: Diagnosis not present

## 2023-09-28 DIAGNOSIS — R260 Ataxic gait: Secondary | ICD-10-CM | POA: Diagnosis not present

## 2023-09-28 DIAGNOSIS — Z604 Social exclusion and rejection: Secondary | ICD-10-CM | POA: Diagnosis not present

## 2023-09-28 DIAGNOSIS — M4800 Spinal stenosis, site unspecified: Secondary | ICD-10-CM | POA: Diagnosis not present

## 2023-09-28 DIAGNOSIS — Z6841 Body Mass Index (BMI) 40.0 and over, adult: Secondary | ICD-10-CM | POA: Diagnosis not present

## 2023-09-28 DIAGNOSIS — E785 Hyperlipidemia, unspecified: Secondary | ICD-10-CM | POA: Diagnosis not present

## 2023-09-28 DIAGNOSIS — Z79899 Other long term (current) drug therapy: Secondary | ICD-10-CM | POA: Diagnosis not present

## 2023-09-28 DIAGNOSIS — E034 Atrophy of thyroid (acquired): Secondary | ICD-10-CM | POA: Diagnosis not present

## 2023-09-28 DIAGNOSIS — I69398 Other sequelae of cerebral infarction: Secondary | ICD-10-CM | POA: Diagnosis not present

## 2023-10-05 DIAGNOSIS — G5601 Carpal tunnel syndrome, right upper limb: Secondary | ICD-10-CM | POA: Diagnosis not present

## 2023-10-15 DIAGNOSIS — I152 Hypertension secondary to endocrine disorders: Secondary | ICD-10-CM | POA: Diagnosis not present

## 2023-10-15 DIAGNOSIS — I480 Paroxysmal atrial fibrillation: Secondary | ICD-10-CM | POA: Diagnosis not present

## 2023-10-15 DIAGNOSIS — F329 Major depressive disorder, single episode, unspecified: Secondary | ICD-10-CM | POA: Diagnosis not present

## 2023-10-15 DIAGNOSIS — Z79899 Other long term (current) drug therapy: Secondary | ICD-10-CM | POA: Diagnosis not present

## 2023-10-15 DIAGNOSIS — Z6841 Body Mass Index (BMI) 40.0 and over, adult: Secondary | ICD-10-CM | POA: Diagnosis not present

## 2023-10-15 DIAGNOSIS — E038 Other specified hypothyroidism: Secondary | ICD-10-CM | POA: Diagnosis not present

## 2023-10-15 DIAGNOSIS — Z604 Social exclusion and rejection: Secondary | ICD-10-CM | POA: Diagnosis not present

## 2023-10-15 DIAGNOSIS — Z9181 History of falling: Secondary | ICD-10-CM | POA: Diagnosis not present

## 2023-10-15 DIAGNOSIS — M4800 Spinal stenosis, site unspecified: Secondary | ICD-10-CM | POA: Diagnosis not present

## 2023-10-15 DIAGNOSIS — E1159 Type 2 diabetes mellitus with other circulatory complications: Secondary | ICD-10-CM | POA: Diagnosis not present

## 2023-10-15 DIAGNOSIS — E785 Hyperlipidemia, unspecified: Secondary | ICD-10-CM | POA: Diagnosis not present

## 2023-10-15 DIAGNOSIS — F411 Generalized anxiety disorder: Secondary | ICD-10-CM | POA: Diagnosis not present

## 2023-10-15 DIAGNOSIS — E1169 Type 2 diabetes mellitus with other specified complication: Secondary | ICD-10-CM | POA: Diagnosis not present

## 2023-10-15 DIAGNOSIS — Z7901 Long term (current) use of anticoagulants: Secondary | ICD-10-CM | POA: Diagnosis not present

## 2023-10-15 DIAGNOSIS — E034 Atrophy of thyroid (acquired): Secondary | ICD-10-CM | POA: Diagnosis not present

## 2023-10-15 DIAGNOSIS — R260 Ataxic gait: Secondary | ICD-10-CM | POA: Diagnosis not present

## 2023-10-15 DIAGNOSIS — I890 Lymphedema, not elsewhere classified: Secondary | ICD-10-CM | POA: Diagnosis not present

## 2023-10-15 DIAGNOSIS — R32 Unspecified urinary incontinence: Secondary | ICD-10-CM | POA: Diagnosis not present

## 2023-10-15 DIAGNOSIS — B372 Candidiasis of skin and nail: Secondary | ICD-10-CM | POA: Diagnosis not present

## 2023-10-15 DIAGNOSIS — I69398 Other sequelae of cerebral infarction: Secondary | ICD-10-CM | POA: Diagnosis not present

## 2023-10-19 DIAGNOSIS — R32 Unspecified urinary incontinence: Secondary | ICD-10-CM | POA: Diagnosis not present

## 2023-10-19 DIAGNOSIS — E038 Other specified hypothyroidism: Secondary | ICD-10-CM | POA: Diagnosis not present

## 2023-10-19 DIAGNOSIS — E1169 Type 2 diabetes mellitus with other specified complication: Secondary | ICD-10-CM | POA: Diagnosis not present

## 2023-10-19 DIAGNOSIS — R260 Ataxic gait: Secondary | ICD-10-CM | POA: Diagnosis not present

## 2023-10-19 DIAGNOSIS — I890 Lymphedema, not elsewhere classified: Secondary | ICD-10-CM | POA: Diagnosis not present

## 2023-10-19 DIAGNOSIS — Z79899 Other long term (current) drug therapy: Secondary | ICD-10-CM | POA: Diagnosis not present

## 2023-10-19 DIAGNOSIS — Z604 Social exclusion and rejection: Secondary | ICD-10-CM | POA: Diagnosis not present

## 2023-10-19 DIAGNOSIS — I69398 Other sequelae of cerebral infarction: Secondary | ICD-10-CM | POA: Diagnosis not present

## 2023-10-19 DIAGNOSIS — F411 Generalized anxiety disorder: Secondary | ICD-10-CM | POA: Diagnosis not present

## 2023-10-19 DIAGNOSIS — M4800 Spinal stenosis, site unspecified: Secondary | ICD-10-CM | POA: Diagnosis not present

## 2023-10-19 DIAGNOSIS — B372 Candidiasis of skin and nail: Secondary | ICD-10-CM | POA: Diagnosis not present

## 2023-10-19 DIAGNOSIS — E034 Atrophy of thyroid (acquired): Secondary | ICD-10-CM | POA: Diagnosis not present

## 2023-10-19 DIAGNOSIS — E785 Hyperlipidemia, unspecified: Secondary | ICD-10-CM | POA: Diagnosis not present

## 2023-10-19 DIAGNOSIS — Z6841 Body Mass Index (BMI) 40.0 and over, adult: Secondary | ICD-10-CM | POA: Diagnosis not present

## 2023-10-19 DIAGNOSIS — I480 Paroxysmal atrial fibrillation: Secondary | ICD-10-CM | POA: Diagnosis not present

## 2023-10-19 DIAGNOSIS — I152 Hypertension secondary to endocrine disorders: Secondary | ICD-10-CM | POA: Diagnosis not present

## 2023-10-19 DIAGNOSIS — F329 Major depressive disorder, single episode, unspecified: Secondary | ICD-10-CM | POA: Diagnosis not present

## 2023-10-19 DIAGNOSIS — Z9181 History of falling: Secondary | ICD-10-CM | POA: Diagnosis not present

## 2023-10-19 DIAGNOSIS — Z7901 Long term (current) use of anticoagulants: Secondary | ICD-10-CM | POA: Diagnosis not present

## 2023-10-19 DIAGNOSIS — E1159 Type 2 diabetes mellitus with other circulatory complications: Secondary | ICD-10-CM | POA: Diagnosis not present

## 2023-10-21 ENCOUNTER — Telehealth: Payer: Self-pay

## 2023-10-21 DIAGNOSIS — I890 Lymphedema, not elsewhere classified: Secondary | ICD-10-CM | POA: Diagnosis not present

## 2023-10-21 DIAGNOSIS — Z9181 History of falling: Secondary | ICD-10-CM | POA: Diagnosis not present

## 2023-10-21 DIAGNOSIS — F329 Major depressive disorder, single episode, unspecified: Secondary | ICD-10-CM | POA: Diagnosis not present

## 2023-10-21 DIAGNOSIS — M4800 Spinal stenosis, site unspecified: Secondary | ICD-10-CM | POA: Diagnosis not present

## 2023-10-21 DIAGNOSIS — R32 Unspecified urinary incontinence: Secondary | ICD-10-CM | POA: Diagnosis not present

## 2023-10-21 DIAGNOSIS — Z7901 Long term (current) use of anticoagulants: Secondary | ICD-10-CM | POA: Diagnosis not present

## 2023-10-21 DIAGNOSIS — E034 Atrophy of thyroid (acquired): Secondary | ICD-10-CM | POA: Diagnosis not present

## 2023-10-21 DIAGNOSIS — R260 Ataxic gait: Secondary | ICD-10-CM | POA: Diagnosis not present

## 2023-10-21 DIAGNOSIS — E785 Hyperlipidemia, unspecified: Secondary | ICD-10-CM | POA: Diagnosis not present

## 2023-10-21 DIAGNOSIS — Z604 Social exclusion and rejection: Secondary | ICD-10-CM | POA: Diagnosis not present

## 2023-10-21 DIAGNOSIS — I480 Paroxysmal atrial fibrillation: Secondary | ICD-10-CM | POA: Diagnosis not present

## 2023-10-21 DIAGNOSIS — E1169 Type 2 diabetes mellitus with other specified complication: Secondary | ICD-10-CM | POA: Diagnosis not present

## 2023-10-21 DIAGNOSIS — E038 Other specified hypothyroidism: Secondary | ICD-10-CM | POA: Diagnosis not present

## 2023-10-21 DIAGNOSIS — E1159 Type 2 diabetes mellitus with other circulatory complications: Secondary | ICD-10-CM | POA: Diagnosis not present

## 2023-10-21 DIAGNOSIS — I152 Hypertension secondary to endocrine disorders: Secondary | ICD-10-CM | POA: Diagnosis not present

## 2023-10-21 DIAGNOSIS — G5601 Carpal tunnel syndrome, right upper limb: Secondary | ICD-10-CM | POA: Diagnosis not present

## 2023-10-21 DIAGNOSIS — Z79899 Other long term (current) drug therapy: Secondary | ICD-10-CM | POA: Diagnosis not present

## 2023-10-21 DIAGNOSIS — I69398 Other sequelae of cerebral infarction: Secondary | ICD-10-CM | POA: Diagnosis not present

## 2023-10-21 DIAGNOSIS — Z6841 Body Mass Index (BMI) 40.0 and over, adult: Secondary | ICD-10-CM | POA: Diagnosis not present

## 2023-10-21 DIAGNOSIS — B372 Candidiasis of skin and nail: Secondary | ICD-10-CM | POA: Diagnosis not present

## 2023-10-21 DIAGNOSIS — F411 Generalized anxiety disorder: Secondary | ICD-10-CM | POA: Diagnosis not present

## 2023-10-21 NOTE — Telephone Encounter (Signed)
 OK for verbals

## 2023-10-21 NOTE — Telephone Encounter (Signed)
 Advised

## 2023-10-21 NOTE — Telephone Encounter (Signed)
 Copied from CRM [id number]. Topic: Clinical - Home Health Verbal Orders >> Oct 21, 2023  8:23 AM Willma SAUNDERS wrote: Caller/Agency: [facility] Callback Number: [phone] Service Requested: Occupational Therapy Frequency: 1 a week for 6 weeks and then once every other week for 3 weeks. Any new concerns about the patient? No

## 2023-10-23 DIAGNOSIS — F329 Major depressive disorder, single episode, unspecified: Secondary | ICD-10-CM | POA: Diagnosis not present

## 2023-10-23 DIAGNOSIS — Z7901 Long term (current) use of anticoagulants: Secondary | ICD-10-CM | POA: Diagnosis not present

## 2023-10-23 DIAGNOSIS — F411 Generalized anxiety disorder: Secondary | ICD-10-CM | POA: Diagnosis not present

## 2023-10-23 DIAGNOSIS — M4800 Spinal stenosis, site unspecified: Secondary | ICD-10-CM | POA: Diagnosis not present

## 2023-10-23 DIAGNOSIS — E1169 Type 2 diabetes mellitus with other specified complication: Secondary | ICD-10-CM | POA: Diagnosis not present

## 2023-10-23 DIAGNOSIS — E1159 Type 2 diabetes mellitus with other circulatory complications: Secondary | ICD-10-CM | POA: Diagnosis not present

## 2023-10-23 DIAGNOSIS — E034 Atrophy of thyroid (acquired): Secondary | ICD-10-CM | POA: Diagnosis not present

## 2023-10-23 DIAGNOSIS — Z79899 Other long term (current) drug therapy: Secondary | ICD-10-CM | POA: Diagnosis not present

## 2023-10-23 DIAGNOSIS — Z9181 History of falling: Secondary | ICD-10-CM | POA: Diagnosis not present

## 2023-10-23 DIAGNOSIS — I480 Paroxysmal atrial fibrillation: Secondary | ICD-10-CM | POA: Diagnosis not present

## 2023-10-23 DIAGNOSIS — E038 Other specified hypothyroidism: Secondary | ICD-10-CM | POA: Diagnosis not present

## 2023-10-23 DIAGNOSIS — R260 Ataxic gait: Secondary | ICD-10-CM | POA: Diagnosis not present

## 2023-10-23 DIAGNOSIS — Z6841 Body Mass Index (BMI) 40.0 and over, adult: Secondary | ICD-10-CM | POA: Diagnosis not present

## 2023-10-23 DIAGNOSIS — Z604 Social exclusion and rejection: Secondary | ICD-10-CM | POA: Diagnosis not present

## 2023-10-23 DIAGNOSIS — I69398 Other sequelae of cerebral infarction: Secondary | ICD-10-CM | POA: Diagnosis not present

## 2023-10-23 DIAGNOSIS — R32 Unspecified urinary incontinence: Secondary | ICD-10-CM | POA: Diagnosis not present

## 2023-10-23 DIAGNOSIS — B372 Candidiasis of skin and nail: Secondary | ICD-10-CM | POA: Diagnosis not present

## 2023-10-23 DIAGNOSIS — G5601 Carpal tunnel syndrome, right upper limb: Secondary | ICD-10-CM | POA: Diagnosis not present

## 2023-10-23 DIAGNOSIS — E785 Hyperlipidemia, unspecified: Secondary | ICD-10-CM | POA: Diagnosis not present

## 2023-10-23 DIAGNOSIS — I152 Hypertension secondary to endocrine disorders: Secondary | ICD-10-CM | POA: Diagnosis not present

## 2023-10-23 DIAGNOSIS — I890 Lymphedema, not elsewhere classified: Secondary | ICD-10-CM | POA: Diagnosis not present

## 2023-10-29 DIAGNOSIS — E785 Hyperlipidemia, unspecified: Secondary | ICD-10-CM | POA: Diagnosis not present

## 2023-10-29 DIAGNOSIS — R260 Ataxic gait: Secondary | ICD-10-CM | POA: Diagnosis not present

## 2023-10-29 DIAGNOSIS — Z79899 Other long term (current) drug therapy: Secondary | ICD-10-CM | POA: Diagnosis not present

## 2023-10-29 DIAGNOSIS — B372 Candidiasis of skin and nail: Secondary | ICD-10-CM | POA: Diagnosis not present

## 2023-10-29 DIAGNOSIS — I69398 Other sequelae of cerebral infarction: Secondary | ICD-10-CM | POA: Diagnosis not present

## 2023-10-29 DIAGNOSIS — M4800 Spinal stenosis, site unspecified: Secondary | ICD-10-CM | POA: Diagnosis not present

## 2023-10-29 DIAGNOSIS — Z9181 History of falling: Secondary | ICD-10-CM | POA: Diagnosis not present

## 2023-10-29 DIAGNOSIS — Z7901 Long term (current) use of anticoagulants: Secondary | ICD-10-CM | POA: Diagnosis not present

## 2023-10-29 DIAGNOSIS — F411 Generalized anxiety disorder: Secondary | ICD-10-CM | POA: Diagnosis not present

## 2023-10-29 DIAGNOSIS — Z6841 Body Mass Index (BMI) 40.0 and over, adult: Secondary | ICD-10-CM | POA: Diagnosis not present

## 2023-10-29 DIAGNOSIS — E1159 Type 2 diabetes mellitus with other circulatory complications: Secondary | ICD-10-CM | POA: Diagnosis not present

## 2023-10-29 DIAGNOSIS — G5601 Carpal tunnel syndrome, right upper limb: Secondary | ICD-10-CM | POA: Diagnosis not present

## 2023-10-29 DIAGNOSIS — R32 Unspecified urinary incontinence: Secondary | ICD-10-CM | POA: Diagnosis not present

## 2023-10-29 DIAGNOSIS — E034 Atrophy of thyroid (acquired): Secondary | ICD-10-CM | POA: Diagnosis not present

## 2023-10-29 DIAGNOSIS — I152 Hypertension secondary to endocrine disorders: Secondary | ICD-10-CM | POA: Diagnosis not present

## 2023-10-29 DIAGNOSIS — E1169 Type 2 diabetes mellitus with other specified complication: Secondary | ICD-10-CM | POA: Diagnosis not present

## 2023-10-29 DIAGNOSIS — Z604 Social exclusion and rejection: Secondary | ICD-10-CM | POA: Diagnosis not present

## 2023-10-29 DIAGNOSIS — I480 Paroxysmal atrial fibrillation: Secondary | ICD-10-CM | POA: Diagnosis not present

## 2023-10-29 DIAGNOSIS — I890 Lymphedema, not elsewhere classified: Secondary | ICD-10-CM | POA: Diagnosis not present

## 2023-10-29 DIAGNOSIS — E038 Other specified hypothyroidism: Secondary | ICD-10-CM | POA: Diagnosis not present

## 2023-10-29 DIAGNOSIS — F329 Major depressive disorder, single episode, unspecified: Secondary | ICD-10-CM | POA: Diagnosis not present

## 2023-11-05 DIAGNOSIS — Z79899 Other long term (current) drug therapy: Secondary | ICD-10-CM | POA: Diagnosis not present

## 2023-11-05 DIAGNOSIS — I890 Lymphedema, not elsewhere classified: Secondary | ICD-10-CM | POA: Diagnosis not present

## 2023-11-05 DIAGNOSIS — E1169 Type 2 diabetes mellitus with other specified complication: Secondary | ICD-10-CM | POA: Diagnosis not present

## 2023-11-05 DIAGNOSIS — R32 Unspecified urinary incontinence: Secondary | ICD-10-CM | POA: Diagnosis not present

## 2023-11-05 DIAGNOSIS — E034 Atrophy of thyroid (acquired): Secondary | ICD-10-CM | POA: Diagnosis not present

## 2023-11-05 DIAGNOSIS — E785 Hyperlipidemia, unspecified: Secondary | ICD-10-CM | POA: Diagnosis not present

## 2023-11-05 DIAGNOSIS — I69398 Other sequelae of cerebral infarction: Secondary | ICD-10-CM | POA: Diagnosis not present

## 2023-11-05 DIAGNOSIS — F411 Generalized anxiety disorder: Secondary | ICD-10-CM | POA: Diagnosis not present

## 2023-11-05 DIAGNOSIS — E038 Other specified hypothyroidism: Secondary | ICD-10-CM | POA: Diagnosis not present

## 2023-11-05 DIAGNOSIS — Z6841 Body Mass Index (BMI) 40.0 and over, adult: Secondary | ICD-10-CM | POA: Diagnosis not present

## 2023-11-05 DIAGNOSIS — R260 Ataxic gait: Secondary | ICD-10-CM | POA: Diagnosis not present

## 2023-11-05 DIAGNOSIS — E1159 Type 2 diabetes mellitus with other circulatory complications: Secondary | ICD-10-CM | POA: Diagnosis not present

## 2023-11-05 DIAGNOSIS — G5601 Carpal tunnel syndrome, right upper limb: Secondary | ICD-10-CM | POA: Diagnosis not present

## 2023-11-05 DIAGNOSIS — Z7901 Long term (current) use of anticoagulants: Secondary | ICD-10-CM | POA: Diagnosis not present

## 2023-11-05 DIAGNOSIS — I152 Hypertension secondary to endocrine disorders: Secondary | ICD-10-CM | POA: Diagnosis not present

## 2023-11-05 DIAGNOSIS — I480 Paroxysmal atrial fibrillation: Secondary | ICD-10-CM | POA: Diagnosis not present

## 2023-11-05 DIAGNOSIS — M4800 Spinal stenosis, site unspecified: Secondary | ICD-10-CM | POA: Diagnosis not present

## 2023-11-05 DIAGNOSIS — F329 Major depressive disorder, single episode, unspecified: Secondary | ICD-10-CM | POA: Diagnosis not present

## 2023-11-05 DIAGNOSIS — Z9181 History of falling: Secondary | ICD-10-CM | POA: Diagnosis not present

## 2023-11-05 DIAGNOSIS — Z604 Social exclusion and rejection: Secondary | ICD-10-CM | POA: Diagnosis not present

## 2023-11-05 DIAGNOSIS — B372 Candidiasis of skin and nail: Secondary | ICD-10-CM | POA: Diagnosis not present

## 2023-11-11 DIAGNOSIS — Z7901 Long term (current) use of anticoagulants: Secondary | ICD-10-CM | POA: Diagnosis not present

## 2023-11-11 DIAGNOSIS — E1159 Type 2 diabetes mellitus with other circulatory complications: Secondary | ICD-10-CM | POA: Diagnosis not present

## 2023-11-11 DIAGNOSIS — G5601 Carpal tunnel syndrome, right upper limb: Secondary | ICD-10-CM | POA: Diagnosis not present

## 2023-11-11 DIAGNOSIS — Z79899 Other long term (current) drug therapy: Secondary | ICD-10-CM | POA: Diagnosis not present

## 2023-11-11 DIAGNOSIS — E1169 Type 2 diabetes mellitus with other specified complication: Secondary | ICD-10-CM | POA: Diagnosis not present

## 2023-11-11 DIAGNOSIS — Z604 Social exclusion and rejection: Secondary | ICD-10-CM | POA: Diagnosis not present

## 2023-11-11 DIAGNOSIS — M4800 Spinal stenosis, site unspecified: Secondary | ICD-10-CM | POA: Diagnosis not present

## 2023-11-11 DIAGNOSIS — E785 Hyperlipidemia, unspecified: Secondary | ICD-10-CM | POA: Diagnosis not present

## 2023-11-11 DIAGNOSIS — I480 Paroxysmal atrial fibrillation: Secondary | ICD-10-CM | POA: Diagnosis not present

## 2023-11-11 DIAGNOSIS — R260 Ataxic gait: Secondary | ICD-10-CM | POA: Diagnosis not present

## 2023-11-11 DIAGNOSIS — B372 Candidiasis of skin and nail: Secondary | ICD-10-CM | POA: Diagnosis not present

## 2023-11-11 DIAGNOSIS — Z9181 History of falling: Secondary | ICD-10-CM | POA: Diagnosis not present

## 2023-11-11 DIAGNOSIS — F411 Generalized anxiety disorder: Secondary | ICD-10-CM | POA: Diagnosis not present

## 2023-11-11 DIAGNOSIS — I152 Hypertension secondary to endocrine disorders: Secondary | ICD-10-CM | POA: Diagnosis not present

## 2023-11-11 DIAGNOSIS — I890 Lymphedema, not elsewhere classified: Secondary | ICD-10-CM | POA: Diagnosis not present

## 2023-11-11 DIAGNOSIS — Z6841 Body Mass Index (BMI) 40.0 and over, adult: Secondary | ICD-10-CM | POA: Diagnosis not present

## 2023-11-11 DIAGNOSIS — F329 Major depressive disorder, single episode, unspecified: Secondary | ICD-10-CM | POA: Diagnosis not present

## 2023-11-11 DIAGNOSIS — E038 Other specified hypothyroidism: Secondary | ICD-10-CM | POA: Diagnosis not present

## 2023-11-11 DIAGNOSIS — I69398 Other sequelae of cerebral infarction: Secondary | ICD-10-CM | POA: Diagnosis not present

## 2023-11-11 DIAGNOSIS — R32 Unspecified urinary incontinence: Secondary | ICD-10-CM | POA: Diagnosis not present

## 2023-11-11 DIAGNOSIS — E034 Atrophy of thyroid (acquired): Secondary | ICD-10-CM | POA: Diagnosis not present

## 2023-11-12 DIAGNOSIS — I480 Paroxysmal atrial fibrillation: Secondary | ICD-10-CM | POA: Diagnosis not present

## 2023-11-12 DIAGNOSIS — Z6841 Body Mass Index (BMI) 40.0 and over, adult: Secondary | ICD-10-CM

## 2023-11-12 DIAGNOSIS — E785 Hyperlipidemia, unspecified: Secondary | ICD-10-CM

## 2023-11-12 DIAGNOSIS — I152 Hypertension secondary to endocrine disorders: Secondary | ICD-10-CM | POA: Diagnosis not present

## 2023-11-12 DIAGNOSIS — E038 Other specified hypothyroidism: Secondary | ICD-10-CM

## 2023-11-12 DIAGNOSIS — E1169 Type 2 diabetes mellitus with other specified complication: Secondary | ICD-10-CM

## 2023-11-12 DIAGNOSIS — F411 Generalized anxiety disorder: Secondary | ICD-10-CM

## 2023-11-12 DIAGNOSIS — F329 Major depressive disorder, single episode, unspecified: Secondary | ICD-10-CM

## 2023-11-12 DIAGNOSIS — E1159 Type 2 diabetes mellitus with other circulatory complications: Secondary | ICD-10-CM | POA: Diagnosis not present

## 2023-11-12 DIAGNOSIS — G5601 Carpal tunnel syndrome, right upper limb: Secondary | ICD-10-CM | POA: Diagnosis not present

## 2023-11-12 DIAGNOSIS — E034 Atrophy of thyroid (acquired): Secondary | ICD-10-CM

## 2023-11-19 ENCOUNTER — Telehealth: Payer: Self-pay

## 2023-11-19 DIAGNOSIS — E785 Hyperlipidemia, unspecified: Secondary | ICD-10-CM | POA: Diagnosis not present

## 2023-11-19 DIAGNOSIS — B372 Candidiasis of skin and nail: Secondary | ICD-10-CM | POA: Diagnosis not present

## 2023-11-19 DIAGNOSIS — F411 Generalized anxiety disorder: Secondary | ICD-10-CM | POA: Diagnosis not present

## 2023-11-19 DIAGNOSIS — Z79899 Other long term (current) drug therapy: Secondary | ICD-10-CM | POA: Diagnosis not present

## 2023-11-19 DIAGNOSIS — I152 Hypertension secondary to endocrine disorders: Secondary | ICD-10-CM | POA: Diagnosis not present

## 2023-11-19 DIAGNOSIS — E034 Atrophy of thyroid (acquired): Secondary | ICD-10-CM | POA: Diagnosis not present

## 2023-11-19 DIAGNOSIS — Z604 Social exclusion and rejection: Secondary | ICD-10-CM | POA: Diagnosis not present

## 2023-11-19 DIAGNOSIS — I69398 Other sequelae of cerebral infarction: Secondary | ICD-10-CM | POA: Diagnosis not present

## 2023-11-19 DIAGNOSIS — E038 Other specified hypothyroidism: Secondary | ICD-10-CM | POA: Diagnosis not present

## 2023-11-19 DIAGNOSIS — G5601 Carpal tunnel syndrome, right upper limb: Secondary | ICD-10-CM | POA: Diagnosis not present

## 2023-11-19 DIAGNOSIS — F329 Major depressive disorder, single episode, unspecified: Secondary | ICD-10-CM | POA: Diagnosis not present

## 2023-11-19 DIAGNOSIS — R260 Ataxic gait: Secondary | ICD-10-CM | POA: Diagnosis not present

## 2023-11-19 DIAGNOSIS — Z6841 Body Mass Index (BMI) 40.0 and over, adult: Secondary | ICD-10-CM | POA: Diagnosis not present

## 2023-11-19 DIAGNOSIS — R32 Unspecified urinary incontinence: Secondary | ICD-10-CM | POA: Diagnosis not present

## 2023-11-19 DIAGNOSIS — I890 Lymphedema, not elsewhere classified: Secondary | ICD-10-CM | POA: Diagnosis not present

## 2023-11-19 DIAGNOSIS — E1159 Type 2 diabetes mellitus with other circulatory complications: Secondary | ICD-10-CM | POA: Diagnosis not present

## 2023-11-19 DIAGNOSIS — I480 Paroxysmal atrial fibrillation: Secondary | ICD-10-CM | POA: Diagnosis not present

## 2023-11-19 DIAGNOSIS — Z7901 Long term (current) use of anticoagulants: Secondary | ICD-10-CM | POA: Diagnosis not present

## 2023-11-19 DIAGNOSIS — M4800 Spinal stenosis, site unspecified: Secondary | ICD-10-CM | POA: Diagnosis not present

## 2023-11-19 DIAGNOSIS — E1169 Type 2 diabetes mellitus with other specified complication: Secondary | ICD-10-CM | POA: Diagnosis not present

## 2023-11-19 DIAGNOSIS — Z9181 History of falling: Secondary | ICD-10-CM | POA: Diagnosis not present

## 2023-11-19 NOTE — Telephone Encounter (Signed)
 Left message for patient to call back.  E2C2 may tell patient that we do not do Covid shots in the office.     Copied from CRM [id number]. Topic: Clinical - Medication Question >> Nov 18, 2023  5:00 PM Christina Rubio wrote: Reason for CRM: pt is wanting to know if we have the covid vaccine in house and if so please call and sch time for her to come in.

## 2023-11-20 DIAGNOSIS — E034 Atrophy of thyroid (acquired): Secondary | ICD-10-CM | POA: Diagnosis not present

## 2023-11-20 DIAGNOSIS — G5601 Carpal tunnel syndrome, right upper limb: Secondary | ICD-10-CM | POA: Diagnosis not present

## 2023-11-20 DIAGNOSIS — Z9181 History of falling: Secondary | ICD-10-CM | POA: Diagnosis not present

## 2023-11-20 DIAGNOSIS — R32 Unspecified urinary incontinence: Secondary | ICD-10-CM | POA: Diagnosis not present

## 2023-11-20 DIAGNOSIS — F411 Generalized anxiety disorder: Secondary | ICD-10-CM | POA: Diagnosis not present

## 2023-11-20 DIAGNOSIS — I152 Hypertension secondary to endocrine disorders: Secondary | ICD-10-CM | POA: Diagnosis not present

## 2023-11-20 DIAGNOSIS — I890 Lymphedema, not elsewhere classified: Secondary | ICD-10-CM | POA: Diagnosis not present

## 2023-11-20 DIAGNOSIS — I69398 Other sequelae of cerebral infarction: Secondary | ICD-10-CM | POA: Diagnosis not present

## 2023-11-20 DIAGNOSIS — R260 Ataxic gait: Secondary | ICD-10-CM | POA: Diagnosis not present

## 2023-11-20 DIAGNOSIS — E1159 Type 2 diabetes mellitus with other circulatory complications: Secondary | ICD-10-CM | POA: Diagnosis not present

## 2023-11-20 DIAGNOSIS — Z79899 Other long term (current) drug therapy: Secondary | ICD-10-CM | POA: Diagnosis not present

## 2023-11-20 DIAGNOSIS — Z6841 Body Mass Index (BMI) 40.0 and over, adult: Secondary | ICD-10-CM | POA: Diagnosis not present

## 2023-11-20 DIAGNOSIS — F329 Major depressive disorder, single episode, unspecified: Secondary | ICD-10-CM | POA: Diagnosis not present

## 2023-11-20 DIAGNOSIS — I480 Paroxysmal atrial fibrillation: Secondary | ICD-10-CM | POA: Diagnosis not present

## 2023-11-20 DIAGNOSIS — Z7901 Long term (current) use of anticoagulants: Secondary | ICD-10-CM | POA: Diagnosis not present

## 2023-11-20 DIAGNOSIS — Z604 Social exclusion and rejection: Secondary | ICD-10-CM | POA: Diagnosis not present

## 2023-11-20 DIAGNOSIS — E038 Other specified hypothyroidism: Secondary | ICD-10-CM | POA: Diagnosis not present

## 2023-11-20 DIAGNOSIS — B372 Candidiasis of skin and nail: Secondary | ICD-10-CM | POA: Diagnosis not present

## 2023-11-20 DIAGNOSIS — M4800 Spinal stenosis, site unspecified: Secondary | ICD-10-CM | POA: Diagnosis not present

## 2023-11-20 DIAGNOSIS — E1169 Type 2 diabetes mellitus with other specified complication: Secondary | ICD-10-CM | POA: Diagnosis not present

## 2023-11-20 DIAGNOSIS — E785 Hyperlipidemia, unspecified: Secondary | ICD-10-CM | POA: Diagnosis not present

## 2023-11-20 NOTE — Telephone Encounter (Signed)
 Called patient. Left detailed message that we do not have covid shots in the office.

## 2023-12-01 ENCOUNTER — Other Ambulatory Visit: Payer: Self-pay | Admitting: Family Medicine

## 2023-12-01 DIAGNOSIS — F418 Other specified anxiety disorders: Secondary | ICD-10-CM

## 2023-12-01 DIAGNOSIS — E034 Atrophy of thyroid (acquired): Secondary | ICD-10-CM

## 2023-12-01 NOTE — Telephone Encounter (Signed)
 LOV 08/13/23 NOV 02/23/24 LRF Zoloft  08/27/23 qty:180 r:1 ; Oxybutynin  06/17/23 qty:30 r:2 LABS 08/13/23 Zoloft  (PHQ-9 during last OV was a refusal)

## 2023-12-02 DIAGNOSIS — F411 Generalized anxiety disorder: Secondary | ICD-10-CM | POA: Diagnosis not present

## 2023-12-02 DIAGNOSIS — I69398 Other sequelae of cerebral infarction: Secondary | ICD-10-CM | POA: Diagnosis not present

## 2023-12-02 DIAGNOSIS — F329 Major depressive disorder, single episode, unspecified: Secondary | ICD-10-CM | POA: Diagnosis not present

## 2023-12-02 DIAGNOSIS — I890 Lymphedema, not elsewhere classified: Secondary | ICD-10-CM | POA: Diagnosis not present

## 2023-12-02 DIAGNOSIS — I152 Hypertension secondary to endocrine disorders: Secondary | ICD-10-CM | POA: Diagnosis not present

## 2023-12-02 DIAGNOSIS — Z79899 Other long term (current) drug therapy: Secondary | ICD-10-CM | POA: Diagnosis not present

## 2023-12-02 DIAGNOSIS — Z6841 Body Mass Index (BMI) 40.0 and over, adult: Secondary | ICD-10-CM | POA: Diagnosis not present

## 2023-12-02 DIAGNOSIS — E785 Hyperlipidemia, unspecified: Secondary | ICD-10-CM | POA: Diagnosis not present

## 2023-12-02 DIAGNOSIS — R32 Unspecified urinary incontinence: Secondary | ICD-10-CM | POA: Diagnosis not present

## 2023-12-02 DIAGNOSIS — E034 Atrophy of thyroid (acquired): Secondary | ICD-10-CM | POA: Diagnosis not present

## 2023-12-02 DIAGNOSIS — M4800 Spinal stenosis, site unspecified: Secondary | ICD-10-CM | POA: Diagnosis not present

## 2023-12-02 DIAGNOSIS — B372 Candidiasis of skin and nail: Secondary | ICD-10-CM | POA: Diagnosis not present

## 2023-12-02 DIAGNOSIS — E038 Other specified hypothyroidism: Secondary | ICD-10-CM | POA: Diagnosis not present

## 2023-12-02 DIAGNOSIS — R260 Ataxic gait: Secondary | ICD-10-CM | POA: Diagnosis not present

## 2023-12-02 DIAGNOSIS — I480 Paroxysmal atrial fibrillation: Secondary | ICD-10-CM | POA: Diagnosis not present

## 2023-12-02 DIAGNOSIS — G5601 Carpal tunnel syndrome, right upper limb: Secondary | ICD-10-CM | POA: Diagnosis not present

## 2023-12-02 DIAGNOSIS — E1169 Type 2 diabetes mellitus with other specified complication: Secondary | ICD-10-CM | POA: Diagnosis not present

## 2023-12-02 DIAGNOSIS — Z7901 Long term (current) use of anticoagulants: Secondary | ICD-10-CM | POA: Diagnosis not present

## 2023-12-02 DIAGNOSIS — Z604 Social exclusion and rejection: Secondary | ICD-10-CM | POA: Diagnosis not present

## 2023-12-02 DIAGNOSIS — E1159 Type 2 diabetes mellitus with other circulatory complications: Secondary | ICD-10-CM | POA: Diagnosis not present

## 2023-12-02 DIAGNOSIS — Z9181 History of falling: Secondary | ICD-10-CM | POA: Diagnosis not present

## 2023-12-10 DIAGNOSIS — M170 Bilateral primary osteoarthritis of knee: Secondary | ICD-10-CM | POA: Diagnosis not present

## 2023-12-10 DIAGNOSIS — M1712 Unilateral primary osteoarthritis, left knee: Secondary | ICD-10-CM | POA: Diagnosis not present

## 2023-12-10 DIAGNOSIS — M1711 Unilateral primary osteoarthritis, right knee: Secondary | ICD-10-CM | POA: Diagnosis not present

## 2023-12-16 DIAGNOSIS — E1159 Type 2 diabetes mellitus with other circulatory complications: Secondary | ICD-10-CM | POA: Diagnosis not present

## 2023-12-16 DIAGNOSIS — M4800 Spinal stenosis, site unspecified: Secondary | ICD-10-CM | POA: Diagnosis not present

## 2023-12-16 DIAGNOSIS — F411 Generalized anxiety disorder: Secondary | ICD-10-CM | POA: Diagnosis not present

## 2023-12-16 DIAGNOSIS — I69398 Other sequelae of cerebral infarction: Secondary | ICD-10-CM | POA: Diagnosis not present

## 2023-12-16 DIAGNOSIS — Z79899 Other long term (current) drug therapy: Secondary | ICD-10-CM | POA: Diagnosis not present

## 2023-12-16 DIAGNOSIS — E034 Atrophy of thyroid (acquired): Secondary | ICD-10-CM | POA: Diagnosis not present

## 2023-12-16 DIAGNOSIS — B372 Candidiasis of skin and nail: Secondary | ICD-10-CM | POA: Diagnosis not present

## 2023-12-16 DIAGNOSIS — Z604 Social exclusion and rejection: Secondary | ICD-10-CM | POA: Diagnosis not present

## 2023-12-16 DIAGNOSIS — Z7901 Long term (current) use of anticoagulants: Secondary | ICD-10-CM | POA: Diagnosis not present

## 2023-12-16 DIAGNOSIS — E1169 Type 2 diabetes mellitus with other specified complication: Secondary | ICD-10-CM | POA: Diagnosis not present

## 2023-12-16 DIAGNOSIS — F329 Major depressive disorder, single episode, unspecified: Secondary | ICD-10-CM | POA: Diagnosis not present

## 2023-12-16 DIAGNOSIS — Z9181 History of falling: Secondary | ICD-10-CM | POA: Diagnosis not present

## 2023-12-16 DIAGNOSIS — I890 Lymphedema, not elsewhere classified: Secondary | ICD-10-CM | POA: Diagnosis not present

## 2023-12-16 DIAGNOSIS — E785 Hyperlipidemia, unspecified: Secondary | ICD-10-CM | POA: Diagnosis not present

## 2023-12-16 DIAGNOSIS — Z6841 Body Mass Index (BMI) 40.0 and over, adult: Secondary | ICD-10-CM | POA: Diagnosis not present

## 2023-12-16 DIAGNOSIS — G5601 Carpal tunnel syndrome, right upper limb: Secondary | ICD-10-CM | POA: Diagnosis not present

## 2023-12-16 DIAGNOSIS — E038 Other specified hypothyroidism: Secondary | ICD-10-CM | POA: Diagnosis not present

## 2023-12-16 DIAGNOSIS — I152 Hypertension secondary to endocrine disorders: Secondary | ICD-10-CM | POA: Diagnosis not present

## 2023-12-16 DIAGNOSIS — R32 Unspecified urinary incontinence: Secondary | ICD-10-CM | POA: Diagnosis not present

## 2023-12-16 DIAGNOSIS — I480 Paroxysmal atrial fibrillation: Secondary | ICD-10-CM | POA: Diagnosis not present

## 2023-12-16 DIAGNOSIS — R260 Ataxic gait: Secondary | ICD-10-CM | POA: Diagnosis not present

## 2024-01-07 NOTE — Progress Notes (Signed)
 Christina Rubio                                          MRN: [id number]   01/07/2024   The [facility]BCI Quality Team Specialist reviewed this patient medical record for the purposes of chart review for care gap closure. The following were reviewed: chart review for care gap closure-kidney health evaluation for diabetes:eGFR  and uACR.    VBCI Quality Team

## 2024-02-01 ENCOUNTER — Ambulatory Visit (INDEPENDENT_AMBULATORY_CARE_PROVIDER_SITE_OTHER)

## 2024-02-01 DIAGNOSIS — L304 Erythema intertrigo: Secondary | ICD-10-CM | POA: Diagnosis not present

## 2024-02-01 DIAGNOSIS — L820 Inflamed seborrheic keratosis: Secondary | ICD-10-CM

## 2024-02-01 DIAGNOSIS — L720 Epidermal cyst: Secondary | ICD-10-CM

## 2024-02-01 DIAGNOSIS — L729 Follicular cyst of the skin and subcutaneous tissue, unspecified: Secondary | ICD-10-CM

## 2024-02-01 MED ORDER — TRIAMCINOLONE ACETONIDE 0.1 % EX OINT: TOPICAL_OINTMENT | CUTANEOUS | 2 refills | Status: AC

## 2024-02-01 MED ORDER — NYSTATIN 100000 UNIT/GM EX OINT: 1.0000 | TOPICAL_OINTMENT | Freq: Two times a day (BID) | CUTANEOUS | 2 refills | Status: AC

## 2024-02-01 NOTE — Patient Instructions (Addendum)

## 2024-02-01 NOTE — Progress Notes (Signed)
    Subjective   Christina Rubio is a 73 y.o. female who presents for the following: Lesion(s) of concern . Patient is established patient   Today patient reports: Area of concern on her forehead Area of concern on her back Rash under right breast  Review of Systems:    No other skin or systemic complaints except as noted in HPI or Assessment and Plan.  The following portions of the chart were reviewed this encounter and updated as appropriate: medications, allergies, medical history  Relevant Medical History:  Personal history of non melanoma skin cancer - see medical history for full details   Objective  (SKPE) Well appearing patient in no apparent distress; mood and affect are within normal limits. Examination was performed of the: Sun Exposed Exam: Scalp, head, eyes, ears, nose, lips, neck, upper extremities, hands, fingers, fingernails  Examination notable for: Seborrheic Keratosis(es): Stuck-on appearing keratotic papule(s) on the trunk, some  irritated with redness, crusting, edema, and/or partial avulsion  EIC - R upper back Dilated pore of winer back  Intertrigo inframmamary  Examination limited by: Clothing and Patient deferred removal     Face Stuck on waxy paps with erythema  Assessment & Plan  (SKAP)   Subcutaneous cyst, favor epidermal inclusion cyst on the back - Explained to patient this most likely is consistent with an epidermal inclusion cyst, which represents trapped hair follicule and skin cells under the skin - Benign, patient reassured - deferred excision   Seborrheic keratosis, inflamed - Discussed diagnosis, typical course, and treatment options for this condition - Reassurance, benign, monitor - ABCDE's discussed - Given irritation and symptoms, will proceed with cryotherapy as below  Intertrigo - chronic condition, flaring and not at  tx goal  - Diagnosis, treatment options, prognosis, risk/ benefit, and side effects of treatment were discussed  with the patient.  - Recommended keeping the area dry and clean - Recommended ZeoZorb  - Start nystatin (MYCOSTATIN) 100,000 unit/gram ointment; Apply topically Two (2) times a day.  - Start triamcinolone  (KENALOG ) 0.1 % cream; Apply topically Two (2) times a day.    Level of service outlined above   Patient instructions (SKPI)   Procedures, orders, diagnosis for this visit:  INFLAMED SEBORRHEIC KERATOSIS Face Symptomatic, irritating, patient would like treated. Destruction of lesion - Face Complexity: simple   Destruction method: cryotherapy   Informed consent: discussed and consent obtained   Timeout:  patient name, date of birth, surgical site, and procedure verified Lesion destroyed using liquid nitrogen: Yes   Region frozen until ice ball extended beyond lesion: Yes   Cryo cycles: 1 or 2. Outcome: patient tolerated procedure well with no complications   Post-procedure details: wound care instructions given     Inflamed seborrheic keratosis -     Destruction of lesion    Return to clinic: No follow-ups on file.  I, Emerick Ege, CMA am acting as scribe for Lauraine JAYSON Kanaris, MD.   Documentation: I have reviewed the above documentation for accuracy and completeness, and I agree with the above.  Lauraine JAYSON Kanaris, MD

## 2024-02-03 ENCOUNTER — Telehealth: Payer: Self-pay

## 2024-02-03 NOTE — Telephone Encounter (Signed)
 Pharmacy Quality Measure Review  This patient is appearing on a report for being at risk of failing the  Kidney Health Evaluation for Patients with Diabetes measure this calendar year.   Last documented A1c on 08/13/2023  Last documented UACR on 08/08/2021  Appointment with PCP scheduled on 02/23/24. Will update appointment notes to obtain UACR at follow up.  Javin Nong E. Marsh, PharmD, CPP Clinical Pharmacist [facility] [phone]

## 2024-02-11 ENCOUNTER — Telehealth: Payer: Self-pay | Admitting: Family Medicine

## 2024-02-11 DIAGNOSIS — E1169 Type 2 diabetes mellitus with other specified complication: Secondary | ICD-10-CM

## 2024-02-11 NOTE — Telephone Encounter (Signed)
 [facility] faxed refill request for the following medications:  Onetouch verio flex system w/ device    Please advise.

## 2024-02-12 MED ORDER — BLOOD GLUCOSE MONITORING SUPPL DEVI: 1.0000 | Freq: Three times a day (TID) | 0 refills | Status: AC

## 2024-02-12 NOTE — Progress Notes (Signed)
 Christina Rubio                                          MRN: [id number]   02/12/2024   The VBCI Quality Team Specialist reviewed this patient medical record for the purposes of chart review for care gap closure. The following were reviewed: chart review for care gap closure-kidney health evaluation for diabetes:eGFR  and uACR.    VBCI Quality Team

## 2024-02-23 ENCOUNTER — Encounter: Admitting: Family Medicine

## 2024-02-23 DIAGNOSIS — E1169 Type 2 diabetes mellitus with other specified complication: Secondary | ICD-10-CM

## 2024-02-25 ENCOUNTER — Ambulatory Visit: Payer: Self-pay

## 2024-02-25 NOTE — Telephone Encounter (Signed)
 FYI Only or Action Required?: FYI only for provider: UC advised, ED/EMS if no transportation.  Patient was last seen in primary care on 08/13/2023 by Myrla Jon HERO, MD.  Called Nurse Triage reporting Cough.  Symptoms began several days ago.  Interventions attempted: OTC medications: coricidin.  Symptoms are: rapidly worsening.  Triage Disposition: See HCP Within 4 Hours (Or PCP Triage)  Patient/caregiver understands and will follow disposition?: Yes Reason for Disposition  [1] MILD difficulty breathing (e.g., minimal/no SOB at rest, SOB with walking, pulse < 100) AND [2] still present when not coughing  Answer Assessment - Initial Assessment Questions Pt reports productive cough and congestion x 3 days. Sputum is yellow/green. Cough sounds severe on the phone. Pt has been taking coricidin. Advised to be seen today d/t severity of cough and complex comorbidities. No same day OV available. Pt is going to attempt to find ride to UC. Pt was provided with closest location, hours and telephone number. Advised that if she is unable to find a ride, ED via EMS would be advised.  911 precautions including shortness of breath at rest reviewed, pt verbalized understanding.  1. ONSET: When did the cough begin?      Monday, 3 days 2. SEVERITY: How bad is the cough today?      Severe 3. SPUTUM: Describe the color of your sputum (e.g., none, dry cough; clear, white, yellow, green)     Yellow/green 4. HEMOPTYSIS: Are you coughing up any blood? If Yes, ask: How much? (e.g., flecks, streaks, tablespoons, etc.)     Denies 5. DIFFICULTY BREATHING: Are you having difficulty breathing? If Yes, ask: How bad is it? (e.g., mild, moderate, severe)      OB with exertion 6. FEVER: Do you have a fever? If Yes, ask: What is your temperature, how was it measured, and when did it start?     Denies 7. CARDIAC HISTORY: Do you have any history of heart disease? (e.g., heart attack,  congestive heart failure)      A fib 8. LUNG HISTORY: Do you have any history of lung disease?  (e.g., pulmonary embolus, asthma, emphysema)     None 9. PE RISK FACTORS: Do you have a history of blood clots? (or: recent major surgery, recent prolonged travel, bedridden)     Disabled but is ambulatory with a walker 10. OTHER SYMPTOMS: Do you have any other symptoms? (e.g., runny nose, wheezing, chest pain)       Nasal congestion  Protocols used: Cough - Acute Productive-A-AH Copied from CRM #[id number]. Topic: Clinical - Red Word Triage >> Feb 25, 2024  9:47 AM Terri G wrote: Red Word that prompted transfer to Nurse Triage: Patient has been a bad cough and coughing up lots of phlegm Past Medical History:  Diagnosis Date   Anxiety    Arthritis    Atrial fibrillation (HCC)    Depression    Grade II diastolic dysfunction    History of CVA (cerebrovascular accident)    Hyperlipidemia    Hypertension    Morbid obesity with BMI of 60.0-69.9, adult (HCC)    Neuromuscular disorder (HCC)    Osteoporosis    PONV (postoperative nausea and vomiting)    Skin cancer    non melanoma   Spinal stenosis    Thyroid  disease    hyperthyroid s/p partial thyroidectomy

## 2024-02-29 NOTE — Telephone Encounter (Signed)
 If she wants a virtual, we could double book the 1pm and I may be able to see her earlier (we'll send her a text link to connect) if we get a no show.

## 2024-03-01 NOTE — Telephone Encounter (Signed)
 Unable to leave voicemail due to voicemail being full. OK for E2C2 to advise per provider ok to schedule virtual visit to discuss symptoms.

## 2024-03-08 DIAGNOSIS — H04123 Dry eye syndrome of bilateral lacrimal glands: Secondary | ICD-10-CM | POA: Diagnosis not present

## 2024-03-08 DIAGNOSIS — Z961 Presence of intraocular lens: Secondary | ICD-10-CM | POA: Diagnosis not present

## 2024-03-08 DIAGNOSIS — H26491 Other secondary cataract, right eye: Secondary | ICD-10-CM | POA: Diagnosis not present

## 2024-03-08 DIAGNOSIS — H35363 Drusen (degenerative) of macula, bilateral: Secondary | ICD-10-CM | POA: Diagnosis not present

## 2024-03-10 ENCOUNTER — Other Ambulatory Visit: Payer: Self-pay | Admitting: Family Medicine

## 2024-03-10 DIAGNOSIS — F418 Other specified anxiety disorders: Secondary | ICD-10-CM

## 2024-03-11 ENCOUNTER — Other Ambulatory Visit: Payer: Self-pay | Admitting: Family Medicine

## 2024-03-11 DIAGNOSIS — F418 Other specified anxiety disorders: Secondary | ICD-10-CM

## 2024-03-14 ENCOUNTER — Other Ambulatory Visit: Payer: Self-pay | Admitting: Family Medicine

## 2024-03-21 ENCOUNTER — Encounter: Admitting: Family Medicine

## 2024-03-30 ENCOUNTER — Ambulatory Visit: Payer: Self-pay

## 2024-03-30 DIAGNOSIS — Z0000 Encounter for general adult medical examination without abnormal findings: Secondary | ICD-10-CM

## 2024-03-30 NOTE — Progress Notes (Signed)
 "  No chief complaint on file.    Subjective:   Christina Rubio is a 74 y.o. female who presents for a Medicare Annual Wellness Visit.  No data recorded  Allergies (verified) Shellfish allergy, Strawberry extract, Epinephrine , and Penicillins   Current Medications (verified) Outpatient Encounter Medications as of 03/30/2024  Medication Sig   ALPRAZolam  (XANAX ) 0.5 MG tablet TAKE 1 TABLET BY MOUTH ONCE DAILY AS NEEDED FOR ANXIETY   apixaban  (ELIQUIS ) 5 MG TABS tablet TAKE 1 TABLET BY MOUTH TWICE DAILY   atorvastatin  (LIPITOR) 20 MG tablet TAKE 1 TABLET BY MOUTH DAILY   azelastine  (ASTELIN ) 0.1 % nasal spray Place 1 spray into both nostrils 2 (two) times daily. Use in each nostril as directed   Blood Glucose Monitoring Suppl DEVI 1 each by Does not apply route in the morning, at noon, and at bedtime. May substitute to any manufacturer covered by patient's insurance.   buPROPion  (WELLBUTRIN  XL) 150 MG 24 hr tablet TAKE 1 TABLET BY MOUTH DAILY WITH BREAKFAST   buPROPion  (WELLBUTRIN  XL) 300 MG 24 hr tablet Take 300 mg by mouth daily.   clotrimazole -betamethasone  (LOTRISONE ) cream Apply 1 Application topically daily.   levothyroxine  (SYNTHROID ) 112 MCG tablet TAKE 1 TABLET EVERY DAY ON EMPTY STOMACHWITH A GLASS OF WATER AT LEAST 30-60 MINBEFORE BREAKFAST   loperamide  (IMODIUM  A-D) 2 MG tablet Take 1 tablet (2 mg total) by mouth 4 (four) times daily as needed for diarrhea or loose stools.   metoprolol  succinate (TOPROL -XL) 50 MG 24 hr tablet TAKE 1 TABLET BY MOUTH DAILY WITH OR IMMEDIATELY FOLLOWING A MEAL   Multiple Vitamins-Minerals (PRESERVISION AREDS 2) CAPS Take 1 tablet by mouth 2 (two) times daily.   nystatin  ointment (MYCOSTATIN ) Apply 1 Application topically 2 (two) times daily.   oxybutynin  (DITROPAN -XL) 5 MG 24 hr tablet TAKE 1 TABLET BY MOUTH AT BEDTIME.   Potassium 99 MG TABS Take 1 tablet by mouth daily in the afternoon.   potassium chloride  SA (KLOR-CON  M) 20 MEQ tablet Take 1  tablet (20 mEq total) by mouth daily.   Probiotic Product (PROBIOTIC PO) Take by mouth daily.   sertraline  (ZOLOFT ) 100 MG tablet TAKE 2 TABLETS BY MOUTH DAILY WITH BREAKFAST   triamcinolone  ointment (KENALOG ) 0.1 % Apply 7 grams twice daily to affected areas of skin. Stop once resolved and restart as needed for flares. Avoid use on face, armpits, groin unless otherwise indicated.   valsartan -hydrochlorothiazide  (DIOVAN -HCT) 160-25 MG tablet TAKE 1 TABLET BY MOUTH NIGHTLY   No facility-administered encounter medications on file as of 03/30/2024.    History: Past Medical History:  Diagnosis Date   Anxiety    Arthritis    Atrial fibrillation (HCC)    Depression    Grade II diastolic dysfunction    History of CVA (cerebrovascular accident)    Hyperlipidemia    Hypertension    Morbid obesity with BMI of 60.0-69.9, adult (HCC)    Neuromuscular disorder (HCC)    Osteoporosis    PONV (postoperative nausea and vomiting)    Skin cancer    non melanoma   Spinal stenosis    Thyroid  disease    hyperthyroid s/p partial thyroidectomy   Past Surgical History:  Procedure Laterality Date   CATARACT EXTRACTION Right    CESAREAN SECTION     CHOLECYSTECTOMY     COLON SURGERY     FEMUR IM NAIL Right 01/09/2016   Procedure: INTRAMEDULLARY (IM) RETROGRADE FEMORAL NAILING RIGHT DISTAL FEMUR;  Surgeon: Norleen Gavel, MD;  Location: [facility] OR;  Service: Orthopedics;  Laterality: Right;   LAPAROSCOPIC GASTRIC SLEEVE RESECTION     Right thyroidectomy     TONSILLECTOMY     Family History  Problem Relation Age of Onset   Lung cancer Mother    Prostate cancer Father    Depression Daughter    Anemia Daughter        PNH   Thyroid  cancer Maternal Uncle    Lung cancer Paternal Uncle    Stroke Maternal Grandmother    Stroke Maternal Grandfather    Stroke Paternal Grandmother    Healthy Sister    Healthy Brother    Healthy Sister    Emphysema Paternal Grandfather    Colon cancer Neg Hx    Breast cancer  Neg Hx    Ovarian cancer Neg Hx    Cervical cancer Neg Hx    Social History   Occupational History   Occupation: retired    Comment: advice worker  Tobacco Use   Smoking status: Never   Smokeless tobacco: Never  Vaping Use   Vaping status: Never Used  Substance and Sexual Activity   Alcohol use: Yes    Comment: 1-2 drinks per year   Drug use: No   Sexual activity: Never   Tobacco Counseling Counseling given: Not Answered  SDOH Screenings   Food Insecurity: No Food Insecurity (03/29/2024)  Housing: Low Risk (03/29/2024)  Transportation Needs: Unmet Transportation Needs (03/29/2024)  Utilities: Not At Risk (10/22/2023)   Received from [facility]  Alcohol Screen: Low Risk (03/29/2024)  Depression (PHQ2-9): Low Risk (03/24/2023)  Recent Concern: Depression (PHQ2-9) - High Risk (01/06/2023)  Financial Resource Strain: Medium Risk (03/29/2024)  Physical Activity: Insufficiently Active (03/29/2024)  Social Connections: Moderately Integrated (03/29/2024)  Stress: Stress Concern Present (03/29/2024)  Tobacco Use: Medium Risk (03/08/2024)   Received from [facility]  Health Literacy: Adequate Health Literacy (03/24/2023)   See flowsheets for full screening details  Depression Screen Depression Screening Exception Documentation Depression Screening Exception:: Patient refusal     Goals Addressed   None          Objective:    There were no vitals filed for this visit. There is no height or weight on file to calculate BMI.  Hearing/Vision screen No results found. Immunizations and Health Maintenance Health Maintenance  Topic Date Due   Mammogram  Never done   Colonoscopy  Never done   Bone Density Scan  Never done   Zoster Vaccines- Shingrix  (2 of 2) 01/12/2018   Diabetic kidney evaluation - Urine ACR  08/09/2022   Influenza Vaccine  10/23/2023   FOOT EXAM  11/11/2023   OPHTHALMOLOGY EXAM  11/13/2023   HEMOGLOBIN A1C  02/13/2024   Medicare Annual  Wellness (AWV)  03/23/2024   COVID-19 Vaccine (5 - Pfizer risk 2025-26 season) 06/29/2024   Diabetic kidney evaluation - eGFR measurement  08/12/2024   DTaP/Tdap/Td (2 - Tdap) 05/23/2028   Pneumococcal Vaccine: 50+ Years  Completed   Hepatitis C Screening  Completed   Meningococcal B Vaccine  Aged Out        Assessment/Plan:  This is a routine wellness examination for [facility]  Patient Care Team: Myrla Jon HERO, MD as PCP - General (Family Medicine) Yvone Rush, MD as Consulting Physician (Orthopedic Surgery) End, Lonni, MD as Consulting Physician (Cardiology) Laurice Francis NOVAK, OD [facility])  I have personally reviewed and noted the following in the patients chart:   Medical and social history Use of  alcohol, tobacco or illicit drugs  Current medications and supplements including opioid prescriptions. Functional ability and status Nutritional status Physical activity Advanced directives List of other physicians Hospitalizations, surgeries, and ER visits in previous 12 months Vitals Screenings to include cognitive, depression, and falls Referrals and appointments  No orders of the defined types were placed in this encounter.  In addition, I have reviewed and discussed with patient certain preventive protocols, quality metrics, and best practice recommendations. A written personalized care plan for preventive services as well as general preventive health recommendations were provided to patient.   Jhonnie GORMAN Das, LPN   10/24/7971   No follow-ups on file.  After Visit Summary: (MyChart) Due to this being a telephonic visit, the after visit summary with patients personalized plan was offered to patient via MyChart   Nurse Notes: UTD ON SHOTS; DECLINES REFERRAL FOR MAMMOGRAM, COLONOSCOPY & BDS   "

## 2024-03-30 NOTE — Patient Instructions (Addendum)
 Christina Rubio,  Thank you for taking the time for your Medicare Wellness Visit. I appreciate your continued commitment to your health goals. Please review the care plan we discussed, and feel free to reach out if I can assist you further.  Please note that Annual Wellness Visits do not include a physical exam. Some assessments may be limited, especially if the visit was conducted virtually. If needed, we may recommend an in[place] follow-up with your provider.  Ongoing Care Seeing your primary care provider every 3 to 6 months helps us  monitor your health and provide consistent, personalized care. APPT. FOR PHYSICAL W/ DR.[email or web address] 05/09/24 @ 3:20 PM [place]  Referrals If a referral was made during today's visit and you haven't received any updates within two weeks, please contact the referred provider directly to check on the status.  Recommended Screenings:  Health Maintenance  Topic Date Due   Breast Cancer Screening  Never done   Colon Cancer Screening  Never done   Osteoporosis screening with Bone Density Scan  Never done   Zoster (Shingles) Vaccine (2 of 2) 01/12/2018   Yearly kidney health urinalysis for diabetes  08/09/2022   Complete foot exam   11/11/2023   Eye exam for diabetics  11/13/2023   Hemoglobin A1C  02/13/2024   COVID-19 Vaccine (5 - Pfizer risk 2025-26 season) 06/29/2024   Yearly kidney function blood test for diabetes  08/12/2024   Medicare Annual Wellness Visit  03/30/2025   DTaP/Tdap/Td vaccine (2 - Tdap) 05/23/2028   Pneumococcal Vaccine for age over 12  Completed   Flu Shot  Completed   Hepatitis C Screening  Completed   Meningitis B Vaccine  Aged Out     Vision: Annual vision screenings are recommended for early detection of glaucoma, cataracts, and diabetic retinopathy. These exams can also reveal signs of chronic conditions such as diabetes and high blood pressure.  Dental: Annual dental screenings help detect early signs of oral cancer, gum  disease, and other conditions linked to overall health, including heart disease and diabetes.  Please see the attached documents for additional preventive care recommendations.   NEXT AWV 04/05/25 @ 8:50 AM IN [place]

## 2024-04-05 NOTE — Progress Notes (Signed)
 "  Chief Complaint  Patient presents with   Medicare Wellness     Subjective:   Christina Rubio is a 74 y.o. female who presents for a Medicare Annual Wellness Visit.  Visit info / Clinical Intake: Medicare Wellness Visit Type:: Subsequent Annual Wellness Visit Persons participating in visit and providing information:: patient Medicare Wellness Visit Mode:: Telephone If telephone:: video error Since this visit was completed virtually, some vitals may be partially provided or unavailable. Missing vitals are due to the limitations of the virtual format.: Unable to obtain vitals - no equipment If Telephone or Video please confirm:: I connected with patient using audio/video enable telemedicine. I verified patient identity with two identifiers, discussed telehealth limitations, and patient agreed to proceed. Patient Location:: HOME Provider Location:: OFFICE Interpreter Needed?: No Pre-visit prep was completed: yes AWV questionnaire completed by patient prior to visit?: no Living arrangements:: (!) lives alone Patient's Overall Health Status Rating: good Typical amount of pain: none Does pain affect daily life?: no Are you currently prescribed opioids?: no  Dietary Habits and Nutritional Risks How many meals a day?: 2 Eats fruit and vegetables daily?: yes Most meals are obtained by: having others provide food; eating out In the last 2 weeks, have you had any of the following?: (!) nausea, vomiting, diarrhea (NAUSEA) Diabetic:: no Any non-healing wounds?: no How often do you check your BS?: 3; as needed (3 TIMES PER WEEK) Would you like to be referred to a Nutritionist or for Diabetic Management? : no  Functional Status Activities of Daily Living (to include ambulation/medication): (!) Needs Assist Feeding: Independent Dressing/Grooming: Independent Bathing: Independent Toileting: Independent Transfer: Independent Ambulation: Independent with device- listed below Home Assistive  Devices/Equipment: Walker (specify Type) Medication Administration: Independent Home Management (perform basic housework or laundry): Needs assistance (comment) Manage your own finances?: yes Primary transportation is: driving Concerns about vision?: no *vision screening is required for WTM* ([facility]- [facility]) Concerns about hearing?: no  Fall Screening Falls in the past year?: 1 Number of falls in past year: 1 Was there an injury with Fall?: 0 Fall Risk Category Calculator: 2 Patient Fall Risk Level: Moderate Fall Risk  Fall Risk Patient at Risk for Falls Due to: Impaired mobility Fall risk Follow up: Falls evaluation completed; Falls prevention discussed  Home and Transportation Safety: All rugs have non-skid backing?: yes All stairs or steps have railings?: yes Grab bars in the bathtub or shower?: (!) no Have non-skid surface in bathtub or shower?: yes Good home lighting?: yes Regular seat belt use?: yes Hospital stays in the last year:: no  Cognitive Assessment Difficulty concentrating, remembering, or making decisions? : no Will 6CIT or Mini Cog be Completed: yes What year is it?: 0 points What month is it?: 0 points Give patient an address phrase to remember (5 components): [address] [place], [place] About what time is it?: 0 points Count backwards from 20 to 1: 0 points Say the months of the year in reverse: 0 points Repeat the address phrase from earlier: 0 points 6 CIT Score: 0 points  Advance Directives (For Healthcare) Does Patient Have a Medical Advance Directive?: No Would patient like information on creating a medical advance directive?: No - Patient declined  Reviewed/Updated  Reviewed/Updated: Reviewed All (Medical, Surgical, Family, Medications, Allergies, Care Teams, Patient Goals)    Allergies (verified) Shellfish allergy, Shellfish protein-containing drug products, Strawberry extract, Epinephrine , and Penicillins   Current  Medications (verified) Outpatient Encounter Medications as of 03/30/2024  Medication Sig  ALPRAZolam  (XANAX ) 0.5 MG tablet TAKE 1 TABLET BY MOUTH ONCE DAILY AS NEEDED FOR ANXIETY   apixaban  (ELIQUIS ) 5 MG TABS tablet TAKE 1 TABLET BY MOUTH TWICE DAILY   atorvastatin  (LIPITOR) 20 MG tablet TAKE 1 TABLET BY MOUTH DAILY   azelastine  (ASTELIN ) 0.1 % nasal spray Place 1 spray into both nostrils 2 (two) times daily. Use in each nostril as directed (Patient taking differently: Place 1 spray into both nostrils 2 (two) times daily. Use in each nostril as directed TAKES PRN)   Blood Glucose Monitoring Suppl DEVI 1 each by Does not apply route in the morning, at noon, and at bedtime. May substitute to any manufacturer covered by patient's insurance.   buPROPion  (WELLBUTRIN  XL) 150 MG 24 hr tablet TAKE 1 TABLET BY MOUTH DAILY WITH BREAKFAST   buPROPion  (WELLBUTRIN  XL) 300 MG 24 hr tablet Take 300 mg by mouth daily.   clotrimazole -betamethasone  (LOTRISONE ) cream Apply 1 Application topically daily.   levothyroxine  (SYNTHROID ) 112 MCG tablet TAKE 1 TABLET EVERY DAY ON EMPTY STOMACHWITH A GLASS OF WATER AT LEAST 30-60 MINBEFORE BREAKFAST   loperamide  (IMODIUM  A-D) 2 MG tablet Take 1 tablet (2 mg total) by mouth 4 (four) times daily as needed for diarrhea or loose stools.   metoprolol  succinate (TOPROL -XL) 50 MG 24 hr tablet TAKE 1 TABLET BY MOUTH DAILY WITH OR IMMEDIATELY FOLLOWING A MEAL   Multiple Vitamins-Minerals (PRESERVISION AREDS 2) CAPS Take 1 tablet by mouth 2 (two) times daily.   nystatin  ointment (MYCOSTATIN ) Apply 1 Application topically 2 (two) times daily.   oxybutynin  (DITROPAN -XL) 5 MG 24 hr tablet TAKE 1 TABLET BY MOUTH AT BEDTIME.   potassium chloride  SA (KLOR-CON  M) 20 MEQ tablet Take 1 tablet (20 mEq total) by mouth daily.   Probiotic Product (PROBIOTIC PO) Take by mouth daily.   sertraline  (ZOLOFT ) 100 MG tablet TAKE 2 TABLETS BY MOUTH DAILY WITH BREAKFAST   triamcinolone  ointment (KENALOG )  0.1 % Apply 7 grams twice daily to affected areas of skin. Stop once resolved and restart as needed for flares. Avoid use on face, armpits, groin unless otherwise indicated.   valsartan -hydrochlorothiazide  (DIOVAN -HCT) 160-25 MG tablet TAKE 1 TABLET BY MOUTH NIGHTLY   Potassium 99 MG TABS Take 1 tablet by mouth daily in the afternoon.   No facility-administered encounter medications on file as of 03/30/2024.    History: Past Medical History:  Diagnosis Date   Anxiety    Arthritis    Atrial fibrillation (HCC)    Depression    Grade II diastolic dysfunction    History of CVA (cerebrovascular accident)    Hyperlipidemia    Hypertension    Morbid obesity with BMI of 60.0-69.9, adult (HCC)    Neuromuscular disorder (HCC)    Osteoporosis    PONV (postoperative nausea and vomiting)    Skin cancer    non melanoma   Spinal stenosis    Thyroid  disease    hyperthyroid s/p partial thyroidectomy   Past Surgical History:  Procedure Laterality Date   CATARACT EXTRACTION Right    CESAREAN SECTION     CHOLECYSTECTOMY     COLON SURGERY     FEMUR IM NAIL Right 01/09/2016   Procedure: INTRAMEDULLARY (IM) RETROGRADE FEMORAL NAILING RIGHT DISTAL FEMUR;  Surgeon: Norleen Gavel, MD;  Location: [facility] OR;  Service: Orthopedics;  Laterality: Right;   LAPAROSCOPIC GASTRIC SLEEVE RESECTION     Right thyroidectomy     TONSILLECTOMY     Family History  Problem Relation Age of  Onset   Lung cancer Mother    Prostate cancer Father    Depression Daughter    Anemia Daughter        PNH   Thyroid  cancer Maternal Uncle    Lung cancer Paternal Uncle    Stroke Maternal Grandmother    Stroke Maternal Grandfather    Stroke Paternal Grandmother    Healthy Sister    Healthy Brother    Healthy Sister    Emphysema Paternal Grandfather    Colon cancer Neg Hx    Breast cancer Neg Hx    Ovarian cancer Neg Hx    Cervical cancer Neg Hx    Social History   Occupational History   Occupation: retired    Comment:  advice worker  Tobacco Use   Smoking status: Never   Smokeless tobacco: Never  Vaping Use   Vaping status: Never Used  Substance and Sexual Activity   Alcohol use: Yes    Comment: 1-2 drinks per year   Drug use: No   Sexual activity: Never   Tobacco Counseling Counseling given: Not Answered  SDOH Screenings   Food Insecurity: No Food Insecurity (03/30/2024)  Housing: Unknown (03/30/2024)  Transportation Needs: No Transportation Needs (03/30/2024)  Recent Concern: Transportation Needs - Unmet Transportation Needs (03/29/2024)  Utilities: Not At Risk (03/30/2024)  Alcohol Screen: Low Risk (03/29/2024)  Depression (PHQ2-9): Low Risk (03/30/2024)  Financial Resource Strain: Medium Risk (03/29/2024)  Physical Activity: Insufficiently Active (03/30/2024)  Social Connections: Moderately Integrated (03/30/2024)  Stress: Stress Concern Present (03/30/2024)  Tobacco Use: Low Risk (03/30/2024)  Recent Concern: Tobacco Use - Medium Risk (03/08/2024)   Received from [facility]  Health Literacy: Adequate Health Literacy (03/30/2024)   See flowsheets for full screening details  Depression Screen Depression Screening Exception Documentation Depression Screening Exception:: Patient refusal  PHQ 2 & 9 Depression Scale- Over the past 2 weeks, how often have you been bothered by any of the following problems? Little interest or pleasure in doing things: 0 Feeling down, depressed, or hopeless (PHQ Adolescent also includes...irritable): 1 PHQ-2 Total Score: 1 Trouble falling or staying asleep, or sleeping too much: 0 Feeling tired or having little energy: 1 Poor appetite or overeating (PHQ Adolescent also includes...weight loss): 0 Feeling bad about yourself - or that you are a failure or have let yourself or your family down: 0 Trouble concentrating on things, such as reading the newspaper or watching television (PHQ Adolescent also includes...like school work): 0 Moving or speaking so slowly that other  people could have noticed. Or the opposite - being so fidgety or restless that you have been moving around a lot more than usual: 0 Thoughts that you would be better off dead, or of hurting yourself in some way: 0 PHQ-9 Total Score: 2 If you checked off any problems, how difficult have these problems made it for you to do your work, take care of things at home, or get along with other people?: Not difficult at all  Depression Treatment Depression Interventions/Treatment : EYV7-0 Score <4 Follow-up Not Indicated; Medication     Goals Addressed             This Visit's Progress    Cut out extra servings               Objective:    There were no vitals filed for this visit. There is no height or weight on file to calculate BMI.  Hearing/Vision screen Hearing Screening - Comments:: NO AIDS Vision  Screening - Comments:: RX GL[facility]- [facility] Immunizations and Health Maintenance Health Maintenance  Topic Date Due   Mammogram  Never done   Colonoscopy  Never done   Bone Density Scan  Never done   Zoster Vaccines- Shingrix  (2 of 2) 01/12/2018   Diabetic kidney evaluation - Urine ACR  08/09/2022   FOOT EXAM  11/11/2023   OPHTHALMOLOGY EXAM  11/13/2023   HEMOGLOBIN A1C  02/13/2024   COVID-19 Vaccine (5 - Pfizer risk 2025-26 season) 06/29/2024   Diabetic kidney evaluation - eGFR measurement  08/12/2024   Medicare Annual Wellness (AWV)  03/30/2025   DTaP/Tdap/Td (2 - Tdap) 05/23/2028   Pneumococcal Vaccine: 50+ Years  Completed   Influenza Vaccine  Completed   Hepatitis C Screening  Completed   Meningococcal B Vaccine  Aged Out        Assessment/Plan:  This is a routine wellness examination for [facility].  Patient Care Team: Myrla Jon HERO, MD as PCP - General (Family Medicine) Yvone Rush, MD as Consulting Physician (Orthopedic Surgery) End, Lonni, MD as Consulting Physician (Cardiology) Laurice Francis NOVAK, OD [facility])  I have personally reviewed and  noted the following in the patients chart:   Medical and social history Use of alcohol, tobacco or illicit drugs  Current medications and supplements including opioid prescriptions. Functional ability and status Nutritional status Physical activity Advanced directives List of other physicians Hospitalizations, surgeries, and ER visits in previous 12 months Vitals Screenings to include cognitive, depression, and falls Referrals and appointments  No orders of the defined types were placed in this encounter.  In addition, I have reviewed and discussed with patient certain preventive protocols, quality metrics, and best practice recommendations. A written personalized care plan for preventive services as well as general preventive health recommendations were provided to patient.   Jhonnie GORMAN Das, LPN   [birth year]   Return in 1 year (on 03/30/2025).  After Visit Summary: (MyChart) Due to this being a telephonic visit, the after visit summary with patients personalized plan was offered to patient via MyChart   Nurse Notes: UTD ON SHOTS; DECLINES REFERRAL FOR MAMMOGRAM, COLONOSCOPY & BDS   "

## 2024-04-06 ENCOUNTER — Telehealth: Payer: Self-pay | Admitting: Family Medicine

## 2024-04-06 NOTE — Telephone Encounter (Signed)
 Called pt to verify if she needs a disability placard or license plate.  She states that she would like a plate.  I have printed an application off and placed in Dr. Rona box if needed.  Please call pt when ready for p/u

## 2024-04-06 NOTE — Telephone Encounter (Signed)
 Copied from CRM [id number]. Topic: General - Other >> Apr 06, 2024 12:54 PM Avram MATSU wrote: Reason for CRM: patient needs to get her plates renewed and stated there is a form for handicap sticker, please advise once forms are ready for pick up.Was informed of the turnaround.  [phone]

## 2024-04-07 NOTE — Telephone Encounter (Signed)
Ready for pick up. Will leave at front desk

## 2024-04-07 NOTE — Telephone Encounter (Signed)
 Called pt and informed her that the form is ready to be picked up at front desk of [address].

## 2024-04-07 NOTE — Telephone Encounter (Signed)
 Form has been picked up.

## 2024-05-09 ENCOUNTER — Encounter: Admitting: Family Medicine

## 2025-04-05 ENCOUNTER — Ambulatory Visit
